# Patient Record
Sex: Male | Born: 1970
Health system: Southern US, Community
[De-identification: ages and names within clinical notes are randomized; demographics above are authoritative.]

## PROBLEM LIST (undated history)

## (undated) DIAGNOSIS — E785 Hyperlipidemia, unspecified: Secondary | ICD-10-CM

## (undated) DIAGNOSIS — I4891 Unspecified atrial fibrillation: Secondary | ICD-10-CM

## (undated) DIAGNOSIS — E119 Type 2 diabetes mellitus without complications: Secondary | ICD-10-CM

## (undated) DIAGNOSIS — B20 Human immunodeficiency virus [HIV] disease: Secondary | ICD-10-CM

## (undated) DIAGNOSIS — F419 Anxiety disorder, unspecified: Secondary | ICD-10-CM

## (undated) DIAGNOSIS — I639 Cerebral infarction, unspecified: Secondary | ICD-10-CM

## (undated) DIAGNOSIS — Z21 Asymptomatic human immunodeficiency virus [HIV] infection status: Secondary | ICD-10-CM

## (undated) DIAGNOSIS — T7840XA Allergy, unspecified, initial encounter: Secondary | ICD-10-CM

## (undated) DIAGNOSIS — I10 Essential (primary) hypertension: Secondary | ICD-10-CM

## (undated) DIAGNOSIS — F329 Major depressive disorder, single episode, unspecified: Secondary | ICD-10-CM

## (undated) HISTORY — DX: Major depressive disorder, single episode, unspecified: F32.9

## (undated) HISTORY — DX: Anxiety disorder, unspecified: F41.9

## (undated) HISTORY — PX: OTHER SURGICAL HISTORY: SHX169

## (undated) HISTORY — DX: Allergy, unspecified, initial encounter: T78.40XA

---

## 2016-01-25 DIAGNOSIS — B20 Human immunodeficiency virus [HIV] disease: Secondary | ICD-10-CM

## 2016-06-06 DIAGNOSIS — B20 Human immunodeficiency virus [HIV] disease: Secondary | ICD-10-CM

## 2016-09-12 ENCOUNTER — Ambulatory Visit: Payer: Self-pay | Admitting: Endocrinology

## 2016-11-19 ENCOUNTER — Ambulatory Visit: Payer: BLUE CROSS/BLUE SHIELD | Attending: General Surgery | Admitting: Physical Therapy

## 2016-11-19 DIAGNOSIS — R2689 Other abnormalities of gait and mobility: Secondary | ICD-10-CM | POA: Insufficient documentation

## 2016-11-19 DIAGNOSIS — M6281 Muscle weakness (generalized): Secondary | ICD-10-CM | POA: Insufficient documentation

## 2016-11-20 NOTE — Therapy (Signed)
[facility] [address] [place], [place], [address] Phone: [phone]   Fax:  [phone]  Physical Therapy Evaluation  Patient Details  Name: Francisco Hutchinson MRN: [id number] Date of Birth: 31-Jan-1971 Referring Provider: Meda Coffee, MD  Encounter Date: 11/19/2016      PT End of Session - 11/20/16 2124    Visit Number 1   Authorization Type [employer] of [place]   PT Start Time 0177   PT Stop Time 1223   PT Time Calculation (min) 91 min      No past medical history on file.  No past surgical history on file.  There were no vitals filed for this visit.       Subjective Assessment - 11/20/16 2122    Subjective power wheelchair eval with Deberah Pelton, ATP from [employer]   Patient is accompained by: Family member   Pertinent History HIV   Currently in Pain? No/denies            [facility] PT Assessment - 11/20/16 0001      Assessment   Medical Diagnosis HIV; s/p watershed CVA's   Referring Provider Meda Coffee, MD   Onset Date/Surgical Date --  Sept. 2016                Mobility/Seating Evaluation    PATIENT INFORMATION: Name: Francisco Hutchinson DOB: Sep 10, 1970  Sex: M Date seen: 11-19-16 Time: 1100  Address:  [address]                 [place], [place] [address] Physician: Meda Coffee, MD This evaluation/justification form will serve as the LMN for the following suppliers: __________________________ Supplier: [employer] Contact Person: Deberah Pelton, ATP Phone:  [phone]   Seating Therapist: Guido Sander, PT Phone:   [phone]   Phone: [phone]    Spouse/Parent/Caregiver name: ?????  Phone number: ????? Insurance/Payer: BCBS of      Reason for Referral: power wheelchair evaluation  Patient/Caregiver Goals: obtain power wheelchair for independence with mobility  Patient was seen for face-to-face evaluation for new power wheelchair.  Also present was Deberah Pelton, ATP to  discuss recommendations and wheelchair options.  Further paperwork was completed and sent to vendor.  Patient appears to qualify for power mobility device at this time per objective findings.   MEDICAL HISTORY: Diagnosis: Primary Diagnosis: Cerebral infarction x 2 Onset: Sept. 2016 Diagnosis: IDDM - Type 2: HIV   '[]'$ Progressive Disease Relevant past and future surgeries: skin graft on RUE due to necrotizing fasciitis (Sept. 2016)   Height: 5'10" Weight: 195# Explain recent changes or trends in weight: ?????   History including Falls: Pt reports having had 1 fall within past 6 months:  pt using standard walker to amb. short distance in amb. with mod assistance (nonfunctional ambulator): Pt was hospitalized on 04-24-15 with necrotizing fasciitis RUE and suffered 2 watershed CVA's; pt had kidney failure and was in a coma for 3 weeks and in ICU x 5 months.  Pt was discharged on 09-25-15 to SNF; pt received home health PT in the fall 2017.  Caregiver reports pt just started ambulating in Oct. 2017 with use of standard walker.     HOME ENVIRONMENT: '[x]'$ House  '[]'$ Condo/town home  '[]'$ Apartment  '[]'$ Assisted Living    '[]'$ Lives Alone '[x]'$  Lives with Others  Hours with caregiver: 27  '[x]'$ Home is accessible to patient           Stairs      '[x]'$ Yes '[]'$  No     Ramp '[x]'$ Yes '[]'$ No Comments:  uses ramp to enter/exit home   COMMUNITY ADL: TRANSPORTATION: '[]'$ Car    '[]'$ Van    '[]'$ Public Transportation    '[]'$ Adapted w/c Lift    '[]'$ Ambulance    '[x]'$ Other:       '[]'$ Sits in wheelchair during transport  Employment/School: ????? Specific requirements pertaining to mobility ?????  Other: planning on obtaining adapted van in future:  pt currently has a Jeep (SUV) for transportation    FUNCTIONAL/SENSORY PROCESSING SKILLS:  Handedness:   '[]'$ Right     '[x]'$ Left    '[]'$ NA  Comments:  ?????  Functional Processing Skills for Wheeled Mobility '[x]'$ Processing Skills  are adequate for safe wheelchair operation  Areas of concern than may interfere with safe operation of wheelchair Description of problem   '[]'$  Attention to environment      '[]'$ Judgment      '[]'$  Hearing  '[]'$  Vision or visual processing      '[]'$ Motor Planning  '[]'$  Fluctuations in Behavior  ?????    VERBAL COMMUNICATION: '[x]'$ WFL receptive '[x]'$  WFL expressive '[]'$ Understandable  '[]'$ Difficult to understand  '[]'$ non-communicative '[]'$  Uses an augmented communication device  CURRENT SEATING / MOBILITY: Current Mobility Base:  '[x]'$ None '[]'$ Dependent '[]'$ Manual '[]'$ Scooter '[]'$ Power  Type of Control: ?????  Manufacturer:  ?????Size:  ?????Age: ?????  Current Condition of Mobility Base:  pt is using a manual wheelchair which was given to him - he is dependent for propulsion   Current Wheelchair components:  ?????  Describe posture in present seating system:  ?????      SENSATION and SKIN ISSUES: Sensation '[]'$ Intact  '[x]'$ Impaired '[]'$ Absent  Level of sensation: Pt reports decr. sensation in both feet over metatarsal head region Pressure Relief: Able to perform effective pressure relief :    '[]'$ Yes  '[x]'$  No Method: ???? If not, Why?: Pt has decreased AROM in RUE with s/p skin graft sx due to necrotizing fasciitis and due to decreased strength in bil. UE's  Skin Issues/Skin Integrity Current Skin Issues  '[x]'$ Yes '[]'$ No '[]'$ Intact '[]'$  Red area'[x]'$  Open Area  '[]'$ Scar Tissue '[x]'$ At risk from prolonged sitting Where  on RUE and bil. LE's - open areas   History of Skin Issues  '[x]'$ Yes '[]'$ No Where  over coccyx When  Sept. 2016  Hx of skin flap surgeries  '[]'$ Yes '[x]'$ No Where  ????? When  ?????  Limited sitting tolerance '[]'$ Yes '[]'$ No Hours spent sitting in wheelchair daily: 11 hours  Complaint of Pain:  Please describe: None   Swelling/Edema: occasional edema in ankles and feet - elevates legs during night and edema is resolved by am; takes Diuretic   ADL STATUS (in reference to wheelchair use):  Indep Assist Unable Indep with Equip Not  assessed Comments  Dressing ????? X ????? ????? ????? needs assistance with lower body dressing - performs from bed ; has trapeze bar on his hospital bed  Eating X ????? ????? ????? ????? ?????  Toileting ????? X ????? ????? ????? wears Depends; unable to transfer on/off toilet independently  Bathing ????? ????? X ????? ????? does sponge baths in bed during the week; uses tub transfer bench with caregiver's assistance to transfer into tub on weekends   Grooming/Hygiene ????? ????? ????? X ????? performs from wheelchair  Meal Prep ????? X ????? X ????? light meal prep; performs from wheelchair   IADLS ????? ?????  X ????? ????? dependent for manual wheelchair propulsion in community; uses motorized scooter in stores  Bowel Management: '[]'$ Continent  '[]'$ Incontinent  '[x]'$ Accidents Comments:  wears Depends  Bladder Management: '[]'$ Continent  '[]'$ Incontinent  '[x]'$ Accidents Comments:  wears Depends     WHEELCHAIR SKILLS: Manual w/c Propulsion: '[]'$ UE or LE strength and endurance sufficient to participate in ADLs using manual wheelchair Arm : '[]'$ left '[]'$ right   '[]'$ Both      Distance: ????? Foot:  '[]'$ left '[]'$ right   '[]'$ Both  Operate Scooter: '[]'$  Strength, hand grip, balance and transfer appropriate for use '[]'$ Living environment is accessible for use of scooter  Operate Power w/c:  '[x]'$  Std. Joystick   '[]'$  Alternative Controls Indep '[]'$  Assist '[]'$  Dependent/unable '[]'$  N/A '[]'$   '[]'$ Safe          '[]'$  Functional      Distance: ?????  Bed confined without wheelchair '[x]'$  Yes '[]'$  No   STRENGTH/RANGE OF MOTION:  Active/Passive  Range of Motion Strength  Shoulder Rt shoulder flexion 90 degrees:  abdct. 68 degrees Lt shoulder flexion 114 degrees: abdct. 105 degrees LUE 3-/5 RUE 3-/5 flexors:  abductors 2+/5  Elbow Decr. Rt elbow supination - approx. 50% AROM  Lt elbow  AROm WNL's 5/5 bil. UE's for flexion and extension  Wrist/Hand Rt wrist flexion 67 degrees:  ext. = 39 degrees Lt wrist AROM WFL's R wrist flexion/extension  3-/5 LUE WFL's  Hip Bil. hip PROM WFL's - decreased actively due to muscle weakness  2+ - 3-/5 bil. LE's  Knee Rt knee extension -24 degrees from neutral: Rt knee flexion WFL's: Lt knee ext. -23 degrees from neutral:  Lt knee flexion WFL's 4/5 bil. LE's  Ankle No active dorsiflexion bil. LE's; minimal active plantarflexion bil. LE's 0/5 bil. dorsiflexors:  2-/5 bil. plantarflexors     MOBILITY/BALANCE:  '[]'$  Patient is totally dependent for mobility  ?????    Balance Transfers Ambulation  Sitting Balance: Standing Balance: '[]'$  Independent '[]'$  Independent/Modified Independent  '[x]'$  WFL     '[]'$  WFL '[x]'$  Supervision '[x]'$  Supervision  '[]'$  Uses UE for balance  '[]'$  Supervision '[]'$  Min Assist '[x]'$  Ambulates with Assist  mod to min assist- pt amb. approx. 10' due to fatigue    '[]'$  Min Assist '[]'$  Min assist '[x]'$  Mod Assist '[x]'$  Ambulates with Device:      '[x]'$  RW  '[]'$  StW  '[]'$  Cane  '[]'$  ?????  '[]'$  Mod Assist '[x]'$  Mod assist '[]'$  Max assist   '[]'$  Max Assist '[]'$  Max assist '[]'$  Dependent '[]'$  Indep. Short Distance Only  '[]'$  Unable '[]'$  Unable '[]'$  Lift / Sling Required Distance (in feet)  ?????   '[]'$  Sliding board '[]'$  Unable to Ambulate (see explanation below)  Cardio Status:  '[]'$ Intact  '[x]'$  Impaired   '[]'$  NA     had a cardioversion while in hospital; caregiver states he had atrial fibrillation  Respiratory Status:  '[x]'$ Intact   '[]'$ Impaired   '[]'$ NA     ?????  Orthotics/Prosthetics: None  Comments (Address manual vs power w/c vs scooter): Mod assist for sit to stand transfers; Supervision with sliding transfers to even surfaces;  pt is unable to effectively and functionally propel a manual wheelchair due to decreased AROM in RUE due to necrotizing fasciitis with skin graft surgery in 2016:  Pt also has decreased AROM and decr. strength in LUE.  Pt has decreased endurance/activity tolerance which limits ability to propel on uneven surfaces and with prolonged distances.  Pt is unable to operate a scooter due to decr. strength  and AROM in bil.  UE's.  Pt's home environment is not large enough to accommodate the large turning radius of a scooter.         Anterior / Posterior Obliquity Rotation-Pelvis ?????  PELVIS    '[]'$  '[x]'$  '[]'$   Neutral Posterior Anterior  '[x]'$  '[]'$  '[]'$   WFL Rt elev Lt elev  '[x]'$  '[]'$  '[]'$   WFL Right Left                      Anterior    Anterior     '[]'$  Fixed '[]'$  Other '[x]'$  Partly Flexible '[]'$  Flexible   '[]'$  Fixed '[]'$  Other '[]'$  Partly Flexible  '[x]'$  Flexible  '[]'$  Fixed '[]'$  Other '[]'$  Partly Flexible  '[x]'$  Flexible   TRUNK  '[]'$  '[x]'$  '[]'$   WFL ? Thoracic ? Lumbar  Kyphosis Lordosis  '[x]'$  '[]'$  '[]'$   WFL Convex Convex  Right Left '[]'$ c-curve '[]'$ s-curve '[]'$ multiple  '[x]'$  Neutral '[]'$  Left-anterior '[]'$  Right-anterior     '[]'$  Fixed '[]'$  Flexible '[x]'$  Partly Flexible '[]'$  Other  '[]'$  Fixed '[x]'$  Flexible '[]'$  Partly Flexible '[]'$  Other  '[]'$  Fixed             '[x]'$  Flexible '[]'$  Partly Flexible '[]'$  Other    Position Windswept  ?????  HIPS          '[x]'$            '[]'$               '[]'$    Neutral       Abduct        ADduct         '[x]'$           '[]'$            '[]'$   Neutral Right           Left      '[]'$  Fixed '[]'$  Subluxed '[]'$  Partly Flexible '[]'$  Dislocated '[x]'$  Flexible  '[]'$  Fixed '[]'$  Other '[]'$  Partly Flexible  '[x]'$  Flexible                 Foot Positioning Knee Positioning  ?????    '[x]'$  WFL  '[x]'$ Lt '[x]'$ Rt '[x]'$  WFL  '[x]'$ Lt '[x]'$ Rt    KNEES ROM concerns: ROM concerns:    & Dorsi-Flexed '[]'$ Lt '[]'$ Rt ?????    FEET Plantar Flexed '[]'$ Lt '[]'$ Rt      Inversion                 '[]'$ Lt '[]'$ Rt      Eversion                 '[]'$ Lt '[]'$ Rt     HEAD '[x]'$  Functional '[x]'$  Good Head Control  ?????  & '[]'$  Flexed         '[]'$  Extended '[]'$  Adequate Head Control    NECK '[]'$  Rotated  Lt  '[]'$  Lat Flexed Lt '[]'$  Rotated  Rt '[]'$  Lat Flexed Rt '[]'$  Limited Head Control     '[]'$  Cervical Hyperextension '[]'$  Absent  Head Control     SHOULDERS ELBOWS WRIST& HAND ?????      Left     Right    Left     Right    Left     Right   U/E '[x]'$ Functional           '[x]'$ Functional ????? limited active elbow extension and supination due to  surgery for necrotizing fasciitis surgery 04/2015 '[]'$ Fisting             '[]'$   Fisting      '[]'$ elev   '[]'$ dep      '[]'$ elev   '[]'$ dep       '[]'$ pro -'[]'$ retract     '[]'$ pro  '[]'$ retract '[]'$ subluxed             '[]'$ subluxed           Goals for Wheelchair Mobility  '[x]'$  Independence with mobility in the home with motor related ADLs (MRADLs)  '[]'$  Independence with MRADLs in the community '[]'$  Provide dependent mobility  '[]'$  Provide recline     '[x]'$ Provide tilt   Goals for Seating system '[x]'$  Optimize pressure distribution '[x]'$  Provide support needed to facilitate function or safety '[x]'$  Provide corrective forces to assist with maintaining or improving posture '[]'$  Accommodate client's posture:   current seated postures and positions are not flexible or will not tolerate corrective forces '[x]'$  Client to be independent with relieving pressure in the wheelchair '[x]'$ Enhance physiological function such as breathing, swallowing, digestion  Simulation ideas/Equipment trials:????? State why other equipment was unsuccessful:?????   MOBILITY BASE RECOMMENDATIONS and JUSTIFICATION: MOBILITY COMPONENT JUSTIFICATION  Manufacturer: SunriseModel: QM 710   Size: Width 18Seat Depth 18 '[x]'$ provide transport from point A to B      '[x]'$ promote Indep mobility  '[x]'$ is not a safe, functional ambulator '[x]'$ walker or cane inadequate '[]'$ non-standard width/depth necessary to accommodate anatomical measurement '[]'$  ?????  '[]'$ Manual Mobility Base '[]'$ non-functional ambulator    '[]'$ Scooter/POV  '[]'$ can safely operate  '[]'$ can safely transfer   '[]'$ has adequate trunk stability  '[]'$ cannot functionally propel manual w/c  '[x]'$ Power Mobility Base  '[]'$ non-ambulatory  '[x]'$ cannot functionally propel manual wheelchair  '[x]'$  cannot functionally and safely operate scooter/POV '[x]'$ can safely operate and willing to  '[]'$ Stroller Base '[]'$ infant/child  '[]'$ unable to propel manual wheelchair '[]'$ allows for growth '[]'$ non-functional ambulator '[]'$ non-functional UE '[]'$ Indep mobility is not a  goal at this time  '[x]'$ Tilt  '[]'$ Forward '[x]'$ Backward '[x]'$ Powered tilt  '[]'$ Manual tilt  '[x]'$ change position against gravitational force on head and shoulders  '[x]'$ change position for pressure relief/cannot weight shift '[]'$ transfers  '[]'$ management of tone '[x]'$ rest periods '[x]'$ control edema '[]'$ facilitate postural control  '[]'$  ?????  '[]'$ Recline  '[]'$ Power recline on power base '[]'$ Manual recline on manual base  '[]'$ accommodate femur to back angle  '[]'$ bring to full recline for ADL care  '[]'$ change position for pressure relief/cannot weight shift '[]'$ rest periods '[]'$ repositioning for transfers or clothing/diaper /catheter changes '[]'$ head positioning  '[]'$ Lighter weight required '[]'$ self- propulsion  '[]'$ lifting '[]'$  ?????  '[]'$ Heavy Duty required '[]'$ user weight greater than 250# '[]'$ extreme tone/ over active movement '[]'$ broken frame on previous chair '[]'$  ?????  '[x]'$  Back  '[]'$  Angle Adjustable '[]'$  Custom molded Jay 3 '[x]'$ postural control '[]'$ control of tone/spasticity '[]'$ accommodation of range of motion '[]'$ UE functional control '[]'$ accommodation for seating system '[]'$  ????? '[]'$ provide lateral trunk support '[]'$ accommodate deformity '[x]'$ provide posterior trunk support '[x]'$ provide lumbar/sacral support '[x]'$ support trunk in midline '[x]'$ Pressure relief over spinal processes  '[x]'$  Seat Cushion Jay Fusion '[]'$ impaired sensation  '[]'$ decubitus ulcers present '[x]'$ history of pressure ulceration '[]'$ prevent pelvic extension '[x]'$ low maintenance  '[x]'$ stabilize pelvis  '[]'$ accommodate obliquity '[]'$ accommodate multiple deformity '[x]'$ neutralize lower extremity position '[x]'$ increase pressure distribution '[]'$  ?????  '[]'$  Pelvic/thigh support  '[]'$  Lateral thigh guide '[]'$  Distal medial pad  '[]'$  Distal lateral pad '[]'$  pelvis in neutral '[]'$ accommodate pelvis '[]'$  position upper legs '[]'$  alignment '[]'$  accommodate ROM '[]'$  decr adduction '[]'$ accommodate tone '[]'$ removable for transfers '[]'$ decr abduction  '[]'$  Lateral trunk Supports '[]'$  Lt     '[]'$  Rt '[]'$ decrease lateral trunk  leaning '[]'$ control tone '[]'$ contour for increased contact '[]'$ safety  '[]'$ accommodate asymmetry '[]'$  ?????  [  x] Mounting hardware  '[]'$ lateral trunk supports  '[]'$ back   '[]'$ seat '[x]'$ headrest      '[]'$  thigh support '[]'$ fixed   '[]'$ swing away '[]'$ attach seat platform/cushion to w/c frame '[]'$ attach back cushion to w/c frame '[]'$ mount postural supports '[x]'$ mount headrest  '[]'$ swing medial thigh support away '[]'$ swing lateral supports away for transfers  '[]'$  ?????    Armrests  '[]'$ fixed '[x]'$ adjustable height '[]'$ removable   '[]'$ swing away  '[x]'$ flip back   '[]'$ reclining '[]'$ full length pads '[]'$ desk    '[]'$ pads tubular  '[x]'$ provide support with elbow at 90   '[]'$ provide support for w/c tray '[x]'$ change of height/angles for variable activities '[x]'$ remove for transfers '[x]'$ allow to come closer to table top '[]'$ remove for access to tables '[x]'$  Dual post  Hangers/ Leg rests  '[]'$ 60 '[]'$ 70 '[]'$ 90 '[]'$ elevating '[]'$ heavy duty  '[]'$ articulating '[]'$ fixed '[]'$ lift off '[]'$ swing away     '[]'$ power '[]'$ provide LE support  '[]'$ accommodate to hamstring tightness '[]'$ elevate legs during recline   '[]'$ provide change in position for Legs '[]'$ Maintain placement of feet on footplate '[]'$ durability '[]'$ enable transfers '[]'$ decrease edema '[]'$ Accommodate lower leg length '[]'$  ?????  Foot support Footplate    '[]'$ Lt  '[]'$  Rt  '[x]'$  Center mount '[]'$ flip up     '[x]'$ depth/angle adjustable '[]'$ Amputee adapter    '[]'$  Lt     '[]'$  Rt '[x]'$ provide foot support '[x]'$ accommodate to ankle ROM '[x]'$ transfers '[]'$ Provide support for residual extremity '[]'$  allow foot to go under wheelchair base '[]'$  decrease tone  '[]'$  ?????  '[]'$  Ankle strap/heel loops '[]'$ support foot on foot support '[]'$ decrease extraneous movement '[]'$ provide input to heel  '[]'$ protect foot  Tires: '[]'$ pneumatic  '[x]'$ flat free inserts  '[]'$ solid  '[x]'$ decrease maintenance  '[x]'$ prevent frequent flats '[]'$ increase shock absorbency '[]'$ decrease pain from road shock '[]'$ decrease spasms from road shock '[]'$  ?????  '[]'$  Headrest  '[]'$ provide posterior head support '[]'$ provide posterior  neck support '[]'$ provide lateral head support '[]'$ provide anterior head support '[]'$ support during tilt and recline '[]'$ improve feeding   '[]'$ improve respiration '[]'$ placement of switches '[]'$ safety  '[]'$ accommodate ROM  '[]'$ accommodate tone '[]'$ improve visual orientation  '[]'$  Anterior chest strap '[]'$  Vest '[]'$  Shoulder retractors  '[]'$ decrease forward movement of shoulder '[]'$ accommodation of TLSO '[]'$ decrease forward movement of trunk '[]'$ decrease shoulder elevation '[]'$ added abdominal support '[]'$ alignment '[]'$ assistance with shoulder control  '[]'$  ?????  Pelvic Positioner '[x]'$ Belt '[]'$ SubASIS bar '[]'$ Dual Pull '[]'$ stabilize tone '[x]'$ decrease falling out of chair/ **will not Decr potential for sliding due to pelvic tilting '[]'$ prevent excessive rotation '[]'$ pad for protection over boney prominence '[]'$ prominence comfort '[]'$ special pull angle to control rotation '[]'$  ?????  Upper Extremity Support '[]'$ L   '[]'$  R '[]'$ Arm trough    '[]'$ hand support '[]'$  tray       '[]'$ full tray '[]'$ swivel mount '[]'$ decrease edema      '[]'$ decrease subluxation   '[]'$ control tone   '[]'$ placement for AAC/Computer/EADL '[]'$ decrease gravitational pull on shoulders '[]'$ provide midline positioning '[]'$ provide support to increase UE function '[]'$ provide hand support in natural position '[]'$ provide work surface   POWER WHEELCHAIR CONTROLS  '[x]'$ Proportional  '[]'$ Non-Proportional Type Joystick '[x]'$ Left  '[]'$ Right '[x]'$ provides access for controlling wheelchair   '[]'$ lacks motor control to operate proportional drive control '[]'$ unable to understand proportional controls  Actuator Control Module  '[]'$ Single  '[x]'$ Multiple   '[x]'$ Allow the client to operate the power seat function(s) through the joystick control   '[]'$ Safety Reset Switches '[]'$ Used to change modes and stop the wheelchair when driving in latch mode    '[x]'$ Upgraded Electronics   '[]'$ programming for accurate control '[]'$ progressive Disease/changing condition '[]'$ non-proportional drive control needed '[x]'$ Needed in order to operate power seat  functions through joystick control   '[]'$   Display box '[]'$ Allows user to see in which mode and drive the wheelchair is set  '[]'$ necessary for alternate controls    '[]'$ Digital interface electronics '[]'$ Allows w/c to operate when using alternative drive controls  '[]'$ ASL Head Array '[]'$ Allows client to operate wheelchair  through switches placed in tri-panel headrest  '[]'$ Sip and puff with tubing kit '[]'$ needed to operate sip and puff drive controls  '[]'$ Upgraded tracking electronics '[]'$ increase safety when driving '[]'$ correct tracking when on uneven surfaces  '[x]'$ Mount for switches or joystick '[]'$ Attaches switches to w/c  '[x]'$ Swing away for access or transfers '[]'$ midline for optimal placement '[]'$ provides for consistent access  '[]'$ Attendant controlled joystick plus mount '[]'$ safety '[]'$ long distance driving '[]'$ operation of seat functions '[]'$ compliance with transportation regulations '[]'$  ?????    Rear wheel placement/Axle adjustability '[]'$ None '[]'$ semi adjustable '[]'$ fully adjustable  '[]'$ improved UE access to wheels '[]'$ improved stability '[]'$ changing angle in space for improvement of postural stability '[]'$ 1-arm drive access '[]'$ amputee pad placement '[]'$  ?????  Wheel rims/ hand rims  '[]'$ metal  '[]'$ plastic coated '[]'$ oblique projections '[]'$ vertical projections '[]'$ Provide ability to propel manual wheelchair  '[]'$  Increase self-propulsion with hand weakness/decreased grasp  Push handles '[]'$ extended  '[]'$ angle adjustable  '[]'$ standard '[]'$ caregiver access '[]'$ caregiver assist '[]'$ allows "hooking" to enable increased ability to perform ADLs or maintain balance  One armed device  '[]'$ Lt   '[]'$ Rt '[]'$ enable propulsion of manual wheelchair with one arm   '[]'$  ?????   Brake/wheel lock extension '[]'$  Lt   '[]'$  Rt '[]'$ increase indep in applying wheel locks   '[]'$ Side guards '[]'$ prevent clothing getting caught in wheel or becoming soiled '[]'$  prevent skin tears/abrasions  Battery: pair of 24 NF's '[x]'$ to power wheelchair ?????  Other: Seat Elevator To increase access, independence and  safety with reaching for objects in cabinets, refridgerator, etc. ?????  The above equipment has a life- long use expectancy. Growth and changes in medical and/or functional conditions would be the exceptions. This is to certify that the therapist has no financial relationship with durable medical provider or manufacturer. The therapist will not receive remuneration of any kind for the equipment recommended in this evaluation.   Patient has mobility limitation that significantly impairs safe, timely participation in one or more mobility related ADL's.  (bathing, toileting, feeding, dressing, grooming, moving from room to room)                                                             '[x]'$  Yes '[]'$  No Will mobility device sufficiently improve ability to participate and/or be aided in participation of MRADL's?         '[x]'$  Yes '[]'$  No Can limitation be compensated for with use of a cane or walker?                                                                                '[]'$  Yes '[x]'$  No Does patient or caregiver demonstrate ability/potential ability & willingness to safely use the mobility device?   '[x]'$  Yes '[]'$  No Does patient's home environment support use of recommended  mobility device?                                                    '[x]'$  Yes '[]'$  No Does patient have sufficient upper extremity function necessary to functionally propel a manual wheelchair?    '[]'$  Yes '[x]'$  No Does patient have sufficient strength and trunk stability to safely operate a POV (scooter)?                                  '[]'$  Yes '[x]'$  No Does patient need additional features/benefits provided by a power wheelchair for MRADL's in the home?       '[x]'$  Yes '[]'$  No Does the patient demonstrate the ability to safely use a power wheelchair?                                                              '[x]'$  Yes '[]'$  No  Therapist Name Printed: Guido Sander, PT Date: 11-19-16  Therapist's Signature:   Date:   Supplier's Name Printed: Mamm[employer] Lorenzo Date: 11-19-16  Supplier's Signature:   Date:  Patient/Caregiver Signature:   Date:     This is to certify that I have read this evaluation and do agree with the content within:      Physician's Name Printed: Dr. Meda Coffee  Physician's Signature:  Date:     This is to certify that I, the above signed therapist have the following affiliations: '[]'$  This DME provider '[]'$  Manufacturer of recommended equipment '[]'$  Patient's long term care facility '[x]'$  None of the above                             Plan - 11/20/16 2127    Clinical Impression Statement pt seen for power wheelchair with Deberah Pelton, [employer] from [employer] - power tilt recommended   PT Frequency One time visit   PT Treatment/Interventions Other (comment)  w/chair management   Consulted and Agree with Plan of Care Patient;Family member/caregiver      Patient will benefit from skilled therapeutic intervention in order to improve the following deficits and impairments:  Decreased strength, Decreased mobility, Abnormal gait, Decreased endurance  Visit Diagnosis: Muscle weakness (generalized) - Plan: PT plan of care cert/re-cert  Other abnormalities of gait and mobility - Plan: PT plan of care cert/re-cert     Problem List There are no active problems to display for this patient.   Alda Lea, PT 11/20/2016, 9:33 PM  [facility] [address] [place], [place], [address] Phone: [phone]   Fax:  [phone]  Name: Rodd Heft MRN: [id number] Date of Birth: 27-Feb-1971

## 2016-12-08 DIAGNOSIS — B20 Human immunodeficiency virus [HIV] disease: Secondary | ICD-10-CM | POA: Diagnosis not present

## 2017-02-09 ENCOUNTER — Encounter: Payer: Self-pay | Admitting: Family Medicine

## 2017-02-09 LAB — HM DIABETES EYE EXAM

## 2017-02-17 ENCOUNTER — Encounter: Payer: Self-pay | Admitting: Family Medicine

## 2017-03-23 ENCOUNTER — Inpatient Hospital Stay (HOSPITAL_COMMUNITY): Payer: BLUE CROSS/BLUE SHIELD | Admitting: Certified Registered Nurse Anesthetist

## 2017-03-23 ENCOUNTER — Encounter (HOSPITAL_COMMUNITY): Admission: EM | Disposition: A | Discharge status: Skilled Nursing Facility | Payer: Self-pay | Source: Home / Self Care

## 2017-03-23 ENCOUNTER — Encounter (HOSPITAL_COMMUNITY): Payer: Self-pay | Admitting: Emergency Medicine

## 2017-03-23 ENCOUNTER — Inpatient Hospital Stay (HOSPITAL_COMMUNITY)
Admission: EM | Admit: 2017-03-23 | Discharge: 2017-04-01 | DRG: 570 | Disposition: A | Discharge status: Skilled Nursing Facility | Payer: BLUE CROSS/BLUE SHIELD | Attending: Surgery | Admitting: Surgery

## 2017-03-23 DIAGNOSIS — Z833 Family history of diabetes mellitus: Secondary | ICD-10-CM

## 2017-03-23 DIAGNOSIS — Z7982 Long term (current) use of aspirin: Secondary | ICD-10-CM | POA: Diagnosis not present

## 2017-03-23 DIAGNOSIS — N183 Chronic kidney disease, stage 3 unspecified: Secondary | ICD-10-CM

## 2017-03-23 DIAGNOSIS — K611 Rectal abscess: Secondary | ICD-10-CM | POA: Diagnosis present

## 2017-03-23 DIAGNOSIS — Z8673 Personal history of transient ischemic attack (TIA), and cerebral infarction without residual deficits: Secondary | ICD-10-CM

## 2017-03-23 DIAGNOSIS — I639 Cerebral infarction, unspecified: Secondary | ICD-10-CM | POA: Diagnosis present

## 2017-03-23 DIAGNOSIS — N493 Fournier gangrene: Secondary | ICD-10-CM | POA: Diagnosis present

## 2017-03-23 DIAGNOSIS — N289 Disorder of kidney and ureter, unspecified: Secondary | ICD-10-CM

## 2017-03-23 DIAGNOSIS — Z8572 Personal history of non-Hodgkin lymphomas: Secondary | ICD-10-CM

## 2017-03-23 DIAGNOSIS — Z79899 Other long term (current) drug therapy: Secondary | ICD-10-CM

## 2017-03-23 DIAGNOSIS — M726 Necrotizing fasciitis: Secondary | ICD-10-CM | POA: Diagnosis present

## 2017-03-23 DIAGNOSIS — Z9889 Other specified postprocedural states: Secondary | ICD-10-CM

## 2017-03-23 DIAGNOSIS — I129 Hypertensive chronic kidney disease with stage 1 through stage 4 chronic kidney disease, or unspecified chronic kidney disease: Secondary | ICD-10-CM | POA: Diagnosis present

## 2017-03-23 DIAGNOSIS — D72829 Elevated white blood cell count, unspecified: Secondary | ICD-10-CM | POA: Diagnosis present

## 2017-03-23 DIAGNOSIS — B9562 Methicillin resistant Staphylococcus aureus infection as the cause of diseases classified elsewhere: Secondary | ICD-10-CM | POA: Diagnosis present

## 2017-03-23 DIAGNOSIS — B964 Proteus (mirabilis) (morganii) as the cause of diseases classified elsewhere: Secondary | ICD-10-CM | POA: Diagnosis present

## 2017-03-23 DIAGNOSIS — E118 Type 2 diabetes mellitus with unspecified complications: Secondary | ICD-10-CM | POA: Diagnosis not present

## 2017-03-23 DIAGNOSIS — E1322 Other specified diabetes mellitus with diabetic chronic kidney disease: Secondary | ICD-10-CM | POA: Diagnosis not present

## 2017-03-23 DIAGNOSIS — L8990 Pressure ulcer of unspecified site, unspecified stage: Secondary | ICD-10-CM | POA: Insufficient documentation

## 2017-03-23 DIAGNOSIS — L0231 Cutaneous abscess of buttock: Secondary | ICD-10-CM | POA: Diagnosis present

## 2017-03-23 DIAGNOSIS — B20 Human immunodeficiency virus [HIV] disease: Secondary | ICD-10-CM | POA: Diagnosis present

## 2017-03-23 DIAGNOSIS — E1122 Type 2 diabetes mellitus with diabetic chronic kidney disease: Secondary | ICD-10-CM

## 2017-03-23 DIAGNOSIS — Z794 Long term (current) use of insulin: Secondary | ICD-10-CM

## 2017-03-23 HISTORY — DX: Essential (primary) hypertension: I10

## 2017-03-23 HISTORY — DX: Human immunodeficiency virus (HIV) disease: B20

## 2017-03-23 HISTORY — DX: Asymptomatic human immunodeficiency virus (hiv) infection status: Z21

## 2017-03-23 HISTORY — PX: IRRIGATION AND DEBRIDEMENT BUTTOCKS: SHX6601

## 2017-03-23 HISTORY — DX: Type 2 diabetes mellitus without complications: E11.9

## 2017-03-23 HISTORY — DX: Cerebral infarction, unspecified: I63.9

## 2017-03-23 LAB — GLUCOSE, CAPILLARY
Glucose-Capillary: 103 mg/dL — ABNORMAL HIGH (ref 65–99)
Glucose-Capillary: 109 mg/dL — ABNORMAL HIGH (ref 65–99)
Glucose-Capillary: 204 mg/dL — ABNORMAL HIGH (ref 65–99)

## 2017-03-23 LAB — COMPREHENSIVE METABOLIC PANEL
ALT: 20 U/L (ref 17–63)
ANION GAP: 8 (ref 5–15)
AST: 22 U/L (ref 15–41)
Albumin: 2.1 g/dL — ABNORMAL LOW (ref 3.5–5.0)
Alkaline Phosphatase: 99 U/L (ref 38–126)
BILIRUBIN TOTAL: 0.8 mg/dL (ref 0.3–1.2)
BUN: 28 mg/dL — ABNORMAL HIGH (ref 6–20)
CALCIUM: 8 mg/dL — AB (ref 8.9–10.3)
CO2: 28 mmol/L (ref 22–32)
Chloride: 97 mmol/L — ABNORMAL LOW (ref 101–111)
Creatinine, Ser: 1.45 mg/dL — ABNORMAL HIGH (ref 0.61–1.24)
GFR, EST NON AFRICAN AMERICAN: 57 mL/min — AB (ref >60)
Glucose, Bld: 121 mg/dL — ABNORMAL HIGH (ref 65–99)
POTASSIUM: 3.8 mmol/L (ref 3.5–5.1)
Sodium: 133 mmol/L — ABNORMAL LOW (ref 135–145)
TOTAL PROTEIN: 7.4 g/dL (ref 6.5–8.1)

## 2017-03-23 LAB — CBC WITH DIFFERENTIAL/PLATELET
BASOS ABS: 0 10*3/uL (ref 0.0–0.1)
Basophils Relative: 0 %
EOS ABS: 0 10*3/uL (ref 0.0–0.7)
Eosinophils Relative: 0 %
HEMATOCRIT: 36.1 % — AB (ref 39.0–52.0)
Hemoglobin: 12.4 g/dL — ABNORMAL LOW (ref 13.0–17.0)
LYMPHS ABS: 1.2 10*3/uL (ref 0.7–4.0)
Lymphocytes Relative: 4 %
MCH: 27.3 pg (ref 26.0–34.0)
MCHC: 34.3 g/dL (ref 30.0–36.0)
MCV: 79.3 fL (ref 78.0–100.0)
MONO ABS: 1.5 10*3/uL — AB (ref 0.1–1.0)
MONOS PCT: 5 %
NEUTROS ABS: 26.4 10*3/uL — AB (ref 1.7–7.7)
Neutrophils Relative %: 91 %
PLATELETS: 436 10*3/uL — AB (ref 150–400)
RBC: 4.55 MIL/uL (ref 4.22–5.81)
RDW: 18.4 % — AB (ref 11.5–15.5)
WBC: 29.1 10*3/uL — ABNORMAL HIGH (ref 4.0–10.5)

## 2017-03-23 LAB — MRSA PCR SCREENING: MRSA BY PCR: POSITIVE — AB

## 2017-03-23 LAB — I-STAT CG4 LACTIC ACID, ED: Lactic Acid, Venous: 1.38 mmol/L (ref 0.5–1.9)

## 2017-03-23 SURGERY — IRRIGATION AND DEBRIDEMENT BUTTOCKS
Anesthesia: General | Site: Buttocks | Laterality: Left

## 2017-03-23 MED ORDER — HEPARIN SODIUM (PORCINE) 5000 UNIT/ML IJ SOLN
5000.0000 [IU] | Freq: Three times a day (TID) | INTRAMUSCULAR | Status: DC
  Administered 2017-03-23 – 2017-04-01 (×24): 5000 [IU] via SUBCUTANEOUS
  Filled 2017-03-23 (×25): qty 1

## 2017-03-23 MED ORDER — INSULIN GLARGINE 100 UNIT/ML ~~LOC~~ SOLN
20.0000 [IU] | Freq: Every day | SUBCUTANEOUS | Status: DC
  Administered 2017-03-23 – 2017-03-25 (×3): 20 [IU] via SUBCUTANEOUS
  Filled 2017-03-23 (×4): qty 0.2

## 2017-03-23 MED ORDER — ONDANSETRON HCL 4 MG/2ML IJ SOLN
INTRAMUSCULAR | Status: AC
  Filled 2017-03-23: qty 2

## 2017-03-23 MED ORDER — ZOLPIDEM TARTRATE 5 MG PO TABS
5.0000 mg | ORAL_TABLET | Freq: Every evening | ORAL | Status: DC | PRN
  Administered 2017-03-30: 5 mg via ORAL
  Filled 2017-03-23: qty 1

## 2017-03-23 MED ORDER — ONDANSETRON HCL 4 MG/2ML IJ SOLN
INTRAMUSCULAR | Status: AC
Start: 2017-03-23 — End: 2017-03-23
  Filled 2017-03-23: qty 2

## 2017-03-23 MED ORDER — INSULIN ASPART 100 UNIT/ML ~~LOC~~ SOLN
0.0000 [IU] | Freq: Three times a day (TID) | SUBCUTANEOUS | Status: DC
  Administered 2017-03-24: 8 [IU] via SUBCUTANEOUS
  Administered 2017-03-24: 3 [IU] via SUBCUTANEOUS
  Administered 2017-03-24: 11 [IU] via SUBCUTANEOUS
  Administered 2017-03-25: 2 [IU] via SUBCUTANEOUS
  Administered 2017-03-26: 3 [IU] via SUBCUTANEOUS
  Administered 2017-03-26 (×2): 2 [IU] via SUBCUTANEOUS
  Administered 2017-03-27 – 2017-03-29 (×3): 3 [IU] via SUBCUTANEOUS
  Administered 2017-03-30 – 2017-03-31 (×2): 2 [IU] via SUBCUTANEOUS
  Administered 2017-03-31: 3 [IU] via SUBCUTANEOUS

## 2017-03-23 MED ORDER — ONDANSETRON HCL 4 MG PO TABS: 4.0000 mg | ORAL_TABLET | Freq: Four times a day (QID) | ORAL | Status: DC | PRN

## 2017-03-23 MED ORDER — DEXAMETHASONE SODIUM PHOSPHATE 10 MG/ML IJ SOLN
INTRAMUSCULAR | Status: AC
  Filled 2017-03-23: qty 1

## 2017-03-23 MED ORDER — SODIUM CHLORIDE 0.9 % IV BOLUS (SEPSIS)
500.0000 mL | Freq: Once | INTRAVENOUS | Status: AC
  Administered 2017-03-23: 500 mL via INTRAVENOUS

## 2017-03-23 MED ORDER — ONDANSETRON HCL 4 MG/2ML IJ SOLN
4.0000 mg | Freq: Four times a day (QID) | INTRAMUSCULAR | Status: DC | PRN
  Administered 2017-03-25: 4 mg via INTRAVENOUS

## 2017-03-23 MED ORDER — ACETAMINOPHEN 650 MG RE SUPP: 650.0000 mg | Freq: Four times a day (QID) | RECTAL | Status: DC | PRN

## 2017-03-23 MED ORDER — FENTANYL CITRATE (PF) 100 MCG/2ML IJ SOLN
INTRAMUSCULAR | Status: DC | PRN
  Administered 2017-03-23 (×4): 50 ug via INTRAVENOUS

## 2017-03-23 MED ORDER — MIDAZOLAM HCL 2 MG/2ML IJ SOLN
INTRAMUSCULAR | Status: AC
  Filled 2017-03-23: qty 2

## 2017-03-23 MED ORDER — PHENYLEPHRINE 40 MCG/ML (10ML) SYRINGE FOR IV PUSH (FOR BLOOD PRESSURE SUPPORT)
PREFILLED_SYRINGE | INTRAVENOUS | Status: AC
  Filled 2017-03-23: qty 20

## 2017-03-23 MED ORDER — VANCOMYCIN HCL IN DEXTROSE 1-5 GM/200ML-% IV SOLN
INTRAVENOUS | Status: AC
  Filled 2017-03-23: qty 200

## 2017-03-23 MED ORDER — HYDROCODONE-ACETAMINOPHEN 5-325 MG PO TABS
1.0000 | ORAL_TABLET | ORAL | Status: DC | PRN
  Filled 2017-03-23 (×2): qty 2

## 2017-03-23 MED ORDER — FENOFIBRATE 160 MG PO TABS
160.0000 mg | ORAL_TABLET | Freq: Every day | ORAL | Status: DC
  Administered 2017-03-23 – 2017-04-01 (×10): 160 mg via ORAL
  Filled 2017-03-23 (×10): qty 1

## 2017-03-23 MED ORDER — ONDANSETRON HCL 4 MG/2ML IJ SOLN: 4.0000 mg | Freq: Once | INTRAMUSCULAR | Status: DC | PRN

## 2017-03-23 MED ORDER — ACETAMINOPHEN 325 MG PO TABS: 650.0000 mg | ORAL_TABLET | Freq: Four times a day (QID) | ORAL | Status: DC | PRN

## 2017-03-23 MED ORDER — HYDROMORPHONE HCL-NACL 0.5-0.9 MG/ML-% IV SOSY: 0.2500 mg | PREFILLED_SYRINGE | INTRAVENOUS | Status: DC | PRN

## 2017-03-23 MED ORDER — LIDOCAINE 2% (20 MG/ML) 5 ML SYRINGE
INTRAMUSCULAR | Status: AC
  Filled 2017-03-23: qty 5

## 2017-03-23 MED ORDER — INSULIN ASPART 100 UNIT/ML ~~LOC~~ SOLN
0.0000 [IU] | Freq: Every day | SUBCUTANEOUS | Status: DC
  Administered 2017-03-23: 2 [IU] via SUBCUTANEOUS
  Administered 2017-03-24: 3 [IU] via SUBCUTANEOUS
  Administered 2017-03-26: 2 [IU] via SUBCUTANEOUS
  Administered 2017-03-27 – 2017-03-31 (×2): 3 [IU] via SUBCUTANEOUS

## 2017-03-23 MED ORDER — INSULIN GLARGINE 100 UNIT/ML SOLOSTAR PEN: 20.0000 [IU] | PEN_INJECTOR | Freq: Every day | SUBCUTANEOUS | Status: DC

## 2017-03-23 MED ORDER — SUCCINYLCHOLINE CHLORIDE 200 MG/10ML IV SOSY
PREFILLED_SYRINGE | INTRAVENOUS | Status: AC
  Filled 2017-03-23: qty 10

## 2017-03-23 MED ORDER — MEPERIDINE HCL 50 MG/ML IJ SOLN: 6.2500 mg | INTRAMUSCULAR | Status: DC | PRN

## 2017-03-23 MED ORDER — FENTANYL CITRATE (PF) 100 MCG/2ML IJ SOLN
INTRAMUSCULAR | Status: AC
  Filled 2017-03-23: qty 2

## 2017-03-23 MED ORDER — VANCOMYCIN HCL IN DEXTROSE 1-5 GM/200ML-% IV SOLN
1000.0000 mg | Freq: Two times a day (BID) | INTRAVENOUS | Status: DC
  Administered 2017-03-24 – 2017-03-26 (×5): 1000 mg via INTRAVENOUS
  Filled 2017-03-23 (×5): qty 200

## 2017-03-23 MED ORDER — PHENYLEPHRINE 40 MCG/ML (10ML) SYRINGE FOR IV PUSH (FOR BLOOD PRESSURE SUPPORT)
PREFILLED_SYRINGE | INTRAVENOUS | Status: DC | PRN
  Administered 2017-03-23 (×10): 80 ug via INTRAVENOUS

## 2017-03-23 MED ORDER — SODIUM CHLORIDE 0.9 % IV SOLN
INTRAVENOUS | Status: DC
  Administered 2017-03-23 – 2017-03-29 (×7): via INTRAVENOUS

## 2017-03-23 MED ORDER — 0.9 % SODIUM CHLORIDE (POUR BTL) OPTIME
TOPICAL | Status: DC | PRN
  Administered 2017-03-23: 1000 mL

## 2017-03-23 MED ORDER — AMLODIPINE BESYLATE 5 MG PO TABS
5.0000 mg | ORAL_TABLET | Freq: Every day | ORAL | Status: DC
  Administered 2017-03-24 – 2017-04-01 (×9): 5 mg via ORAL
  Filled 2017-03-23 (×9): qty 1

## 2017-03-23 MED ORDER — ONDANSETRON HCL 4 MG/2ML IJ SOLN
INTRAMUSCULAR | Status: DC | PRN
  Administered 2017-03-23: 4 mg via INTRAVENOUS

## 2017-03-23 MED ORDER — DOLUTEGRAVIR SODIUM 50 MG PO TABS
50.0000 mg | ORAL_TABLET | Freq: Every day | ORAL | Status: DC
  Administered 2017-03-23 – 2017-04-01 (×10): 50 mg via ORAL
  Filled 2017-03-23 (×10): qty 1

## 2017-03-23 MED ORDER — PROPOFOL 10 MG/ML IV BOLUS
INTRAVENOUS | Status: DC | PRN
  Administered 2017-03-23: 120 mg via INTRAVENOUS

## 2017-03-23 MED ORDER — MIDAZOLAM HCL 5 MG/5ML IJ SOLN
INTRAMUSCULAR | Status: DC | PRN
  Administered 2017-03-23 (×2): 1 mg via INTRAVENOUS

## 2017-03-23 MED ORDER — VANCOMYCIN HCL IN DEXTROSE 1-5 GM/200ML-% IV SOLN
1000.0000 mg | Freq: Once | INTRAVENOUS | Status: AC
  Administered 2017-03-23: 1000 mg via INTRAVENOUS

## 2017-03-23 MED ORDER — HYDROCHLOROTHIAZIDE 25 MG PO TABS
25.0000 mg | ORAL_TABLET | Freq: Every day | ORAL | Status: DC
  Administered 2017-03-24 – 2017-04-01 (×9): 25 mg via ORAL
  Filled 2017-03-23 (×9): qty 1

## 2017-03-23 MED ORDER — EMTRICITABINE-TENOFOVIR AF 200-25 MG PO TABS
1.0000 | ORAL_TABLET | Freq: Every day | ORAL | Status: DC
  Administered 2017-03-24 – 2017-04-01 (×9): 1 via ORAL
  Filled 2017-03-23 (×9): qty 1

## 2017-03-23 MED ORDER — PIPERACILLIN-TAZOBACTAM 3.375 G IVPB 30 MIN
3.3750 g | Freq: Once | INTRAVENOUS | Status: AC
  Administered 2017-03-23: 3.375 g via INTRAVENOUS
  Filled 2017-03-23: qty 50

## 2017-03-23 MED ORDER — DEXAMETHASONE SODIUM PHOSPHATE 10 MG/ML IJ SOLN
INTRAMUSCULAR | Status: DC | PRN
  Administered 2017-03-23: 5 mg via INTRAVENOUS

## 2017-03-23 MED ORDER — METOPROLOL TARTRATE 50 MG PO TABS
100.0000 mg | ORAL_TABLET | Freq: Every day | ORAL | Status: DC
  Administered 2017-03-24 – 2017-04-01 (×9): 100 mg via ORAL
  Filled 2017-03-23: qty 2
  Filled 2017-03-23: qty 4
  Filled 2017-03-23 (×4): qty 2
  Filled 2017-03-23 (×2): qty 4
  Filled 2017-03-23: qty 2

## 2017-03-23 MED ORDER — LACTATED RINGERS IV SOLN
INTRAVENOUS | Status: DC | PRN
  Administered 2017-03-23 (×2): via INTRAVENOUS

## 2017-03-23 MED ORDER — SUCCINYLCHOLINE CHLORIDE 20 MG/ML IJ SOLN
INTRAMUSCULAR | Status: DC | PRN
  Administered 2017-03-23: 120 mg via INTRAVENOUS

## 2017-03-23 MED ORDER — SERTRALINE HCL 50 MG PO TABS
50.0000 mg | ORAL_TABLET | Freq: Every day | ORAL | Status: DC
  Administered 2017-03-23 – 2017-04-01 (×10): 50 mg via ORAL
  Filled 2017-03-23 (×10): qty 1

## 2017-03-23 MED ORDER — LIDOCAINE 2% (20 MG/ML) 5 ML SYRINGE
INTRAMUSCULAR | Status: DC | PRN
  Administered 2017-03-23: 100 mg via INTRAVENOUS

## 2017-03-23 MED ORDER — PIPERACILLIN-TAZOBACTAM 3.375 G IVPB
3.3750 g | Freq: Three times a day (TID) | INTRAVENOUS | Status: DC
  Administered 2017-03-23 – 2017-03-26 (×9): 3.375 g via INTRAVENOUS
  Filled 2017-03-23 (×9): qty 50

## 2017-03-23 MED ORDER — PROPOFOL 10 MG/ML IV BOLUS
INTRAVENOUS | Status: AC
  Filled 2017-03-23: qty 20

## 2017-03-23 SURGICAL SUPPLY — 24 items
BLADE HEX COATED 2.75 (ELECTRODE) ×3 IMPLANT
BLADE SURG 15 STRL LF DISP TIS (BLADE) ×1 IMPLANT
BLADE SURG 15 STRL SS (BLADE) ×2
BNDG GAUZE ELAST 4 BULKY (GAUZE/BANDAGES/DRESSINGS) ×3 IMPLANT
COVER SURGICAL LIGHT HANDLE (MISCELLANEOUS) ×3 IMPLANT
ELECT PENCIL ROCKER SW 15FT (MISCELLANEOUS) ×3 IMPLANT
ELECT REM PT RETURN 15FT ADLT (MISCELLANEOUS) ×3 IMPLANT
GAUZE SPONGE 4X4 12PLY STRL (GAUZE/BANDAGES/DRESSINGS) ×3 IMPLANT
GAUZE SPONGE 4X4 16PLY XRAY LF (GAUZE/BANDAGES/DRESSINGS) ×3 IMPLANT
GLOVE BIOGEL PI IND STRL 7.0 (GLOVE) ×1 IMPLANT
GLOVE BIOGEL PI INDICATOR 7.0 (GLOVE) ×2
GOWN STRL REUS W/TWL LRG LVL3 (GOWN DISPOSABLE) ×3 IMPLANT
GOWN STRL REUS W/TWL XL LVL3 (GOWN DISPOSABLE) ×6 IMPLANT
KIT BASIN OR (CUSTOM PROCEDURE TRAY) ×3 IMPLANT
LUBRICANT JELLY K Y 4OZ (MISCELLANEOUS) IMPLANT
NEEDLE HYPO 25X1 1.5 SAFETY (NEEDLE) IMPLANT
PACK LITHOTOMY IV (CUSTOM PROCEDURE TRAY) ×3 IMPLANT
PAD ABD 8X10 STRL (GAUZE/BANDAGES/DRESSINGS) ×3 IMPLANT
SOL PREP PROV IODINE SCRUB 4OZ (MISCELLANEOUS) ×3 IMPLANT
SWAB COLLECTION DEVICE MRSA (MISCELLANEOUS) ×3 IMPLANT
SYR CONTROL 10ML LL (SYRINGE) IMPLANT
TOWEL OR 17X26 10 PK STRL BLUE (TOWEL DISPOSABLE) ×3 IMPLANT
UNDERPAD 30X30 (UNDERPADS AND DIAPERS) ×3 IMPLANT
YANKAUER SUCT BULB TIP 10FT TU (MISCELLANEOUS) ×3 IMPLANT

## 2017-03-23 NOTE — H&P (Addendum)
[facility]                                                                                    Patient Demographics  Francisco Hutchinson, is a 46 y.o. male  CSN: [id number]  MRN: [id number]  DOB - Aug 29, 1970  Admit Date - 03/23/2017  Outpatient Primary MD for the patient is Marco Collie, MD   With History of -  Past Medical History:  Diagnosis Date  . Diabetes mellitus without complication ([place])   . HIV (human immunodeficiency virus infection) ([place])   . Hypertension   . Stroke [facility])       Past Surgical History:  Procedure Laterality Date  . abscess removed from back and left armpit    . Surgery Right Arm      in for   Chief Complaint  Patient presents with  . Abscess     HPI  Francisco Hutchinson  is a 46 y.o. male, With past medical history significant for HIV disease being followed at [facility] clinic , and history of diabetes mellitus on Lantus, presenting with 6 days history of left buttock pain, abscess and later on drained. The patient has history of right arm necrotic fasciitis status post treatment in the past his HIV PCR is unknown for now but he is on medications and reports to be compliant patient was seen by surgery who will perform the procedure today after all the labs were drawn.    Review of Systems    In addition to the HPI above,  No Headache, No changes with Vision or hearing, No problems swallowing food or Liquids, No Chest pain, Cough or Shortness of Breath, No Abdominal pain, No Nausea or Vommitting, Bowel movements are regular, No Blood in stool or Urine, No dysuria, No new skin rashes or bruises, No new joints pains-aches,  No new weakness, tingling, numbness in any extremity, No recent weight gain or loss, No polyuria, polydypsia or polyphagia, No significant Mental Stressors.  A full 10 point Review of Systems was done, except as stated above, all other Review of Systems were negative.   Social History Social History   Substance Use Topics  . Smoking status: Never Smoker  . Smokeless tobacco: Never Used  . Alcohol use No     Family History History reviewed. No pertinent family history.   Prior to Admission medications   Medication Sig Start Date End Date Taking? Authorizing Provider  ACETAMINOPHEN PO Take 2 tablets by mouth daily as needed (fever and mild pain).   Yes [provider]  amLODipine (NORVASC) 5 MG tablet Take 5 mg by mouth daily.  03/03/17  Yes [provider]  aspirin EC 81 MG tablet Take 81 mg by mouth at bedtime.   Yes [provider]  DESCOVY 200-25 MG tablet Take 1 tablet by mouth daily. 03/19/17  Yes [provider]  fenofibrate 160 MG tablet Take 80 mg by mouth at bedtime.  01/20/17  Yes [provider]  hydrochlorothiazide (HYDRODIURIL) 25 MG tablet Take 25 mg by mouth daily.  02/18/17  Yes [provider]  LANTUS SOLOSTAR 100 UNIT/ML Solostar Pen Inject 30 Units into the  skin at bedtime.  03/03/17  Yes [provider]  metoprolol tartrate (LOPRESSOR) 100 MG tablet Take 100 mg by mouth 2 (two) times daily.  01/26/17  Yes [provider]  sertraline (ZOLOFT) 50 MG tablet Take 50 mg by mouth at bedtime.  02/18/17  Yes [provider]  TIVICAY 50 MG tablet Take 50 mg by mouth at bedtime.  03/19/17  Yes [provider]  TRULICITY 1.5 YS/0.6TK SOPN Inject 1.5 mg into the skin once a week. On Sunday 02/27/17  Yes [provider]    No Known Allergies  Physical Exam  Vitals  Blood pressure 117/62, pulse 66, temperature 99.3 F (37.4 C), temperature source Oral, resp. rate 20, SpO2 98 %.   1. General Young male, looks tired  2. Normal affect and insight, Not Suicidal or Homicidal, Awake Alert, Oriented X 3.  3. No F.N deficits, grossly, patient moving all extremities.  4. Ears and Eyes appear Normal, Conjunctivae clear, PERRLA. Moist Oral Mucosa.  5. Supple Neck, No JVD, No cervical  lymphadenopathy appriciated, No Carotid Bruits.  6. Symmetrical Chest wall movement, Good air movement bilaterally, CTAB.  7. RRR, No Gallops, Rubs or Murmurs, No Parasternal Heave.  8. Positive Bowel Sounds, Abdomen Soft, Non tender, No organomegaly appriciated,No rebound -guarding or rigidity.  9.  No Cyanosis, Normal Skin Turgor, No Skin Rash or Bruise.  10. Good muscle tone,  left buttocks ulcer with drain noted. Old right arm wound noted with few open ulcers noted.    Data Review  CBC No results for input(s): WBC, HGB, HCT, PLT, MCV, MCH, MCHC, RDW, LYMPHSABS, MONOABS, EOSABS, BASOSABS, BANDABS in the last 168 hours.  Invalid input(s): NEUTRABS, BANDSABD ------------------------------------------------------------------------------------------------------------------  Chemistries  No results for input(s): NA, K, CL, CO2, GLUCOSE, BUN, CREATININE, CALCIUM, MG, AST, ALT, ALKPHOS, BILITOT in the last 168 hours.  Invalid input(s): GFRCGP ------------------------------------------------------------------------------------------------------------------ CrCl cannot be calculated (No order found.). ------------------------------------------------------------------------------------------------------------------ No results for input(s): TSH, T4TOTAL, T3FREE, THYROIDAB in the last 72 hours.  Invalid input(s): FREET3   Coagulation profile No results for input(s): INR, PROTIME in the last 168 hours. ------------------------------------------------------------------------------------------------------------------- No results for input(s): DDIMER in the last 72 hours. -------------------------------------------------------------------------------------------------------------------  Cardiac Enzymes No results for input(s): CKMB, TROPONINI, MYOGLOBIN in the last 168 hours.  Invalid input(s):  CK ------------------------------------------------------------------------------------------------------------------ Invalid input(s): POCBNP   ---------------------------------------------------------------------------------------------------------------  Urinalysis No results found for: COLORURINE, APPEARANCEUR, LABSPEC, [place], GLUCOSEU, HGBUR, BILIRUBINUR, KETONESUR, PROTEINUR, UROBILINOGEN, NITRITE, LEUKOCYTESUR  ----------------------------------------------------------------------------------------------------------------     Imaging results:   No results found.    Assessment & Plan  1. Left buttock abscess 2. HIV disease on medications, unknown status 3. Diabetes mellitus on Lantus 4. History of right arm cellulitis/necrotic fasciitis, treated  Plan IV vancomycin and Zosyn Continue with HIV medications For surgery today ID Consulted  DVT Prophylaxis Lovenox  AM Labs Ordered, also please review Full Orders  Family Communication: Admission, patients condition and plan of care including tests being ordered have been discussed with the patient and mother who indicate understanding and agree with the plan and Code Status.  Code Status full  Disposition Plan: Home  Time spent in minutes : 44 minutes  Condition GUARDED   @SIGNATURE @

## 2017-03-23 NOTE — ED Notes (Signed)
ED Provider at bedside. 

## 2017-03-23 NOTE — ED Notes (Signed)
Hospitalist at bedside 

## 2017-03-23 NOTE — Consult Note (Signed)
Subjective: CC: Perirectal abscess.  Hx:  I was asked to see Francisco Hutchinson in consultation by Dr. Excell Seltzer during a debridement of what turned out to be perirectal and perineal necrotizing fasciitis that involved the periurethral area around the bulb.   No obvious urethral rent was identified but the bulbospongiosus was associated with the necrosis.  I interviewed Francisco Hutchinson in PACU post op.   He had no perioperative voiding complaints other than possible malodorous urine.  He had an episode of retention a couple of years ago but no other GU history.  A foley passed without difficulty and the urine was initial clear but became quite purulent at the end of drainage.     ROS:  Review of Systems  Unable to perform ROS: Critical illness    No Known Allergies  Past Medical History:  Diagnosis Date  . Diabetes mellitus without complication ([place])   . HIV (human immunodeficiency virus infection) ([place])   . Hypertension   . Stroke [facility])     Past Surgical History:  Procedure Laterality Date  . abscess removed from back and left armpit    . Surgery Right Arm      Social History   Social History  . Marital status: Single    Spouse name: N/A  . Number of children: N/A  . Years of education: N/A   Occupational History  . Not on file.   Social History Main Topics  . Smoking status: Never Smoker  . Smokeless tobacco: Never Used  . Alcohol use No  . Drug use: No  . Sexual activity: No   Other Topics Concern  . Not on file   Social History Narrative  . No narrative on file    History reviewed. No pertinent family history.  Anti-infectives: Anti-infectives    Start     Dose/Rate Route Frequency Ordered Stop   03/23/17 1428  vancomycin (VANCOCIN) 1-5 GM/200ML-% IVPB    Comments:  [place], Lacey   : cabinet override      03/23/17 1428 03/23/17 1518   03/23/17 1215  piperacillin-tazobactam (ZOSYN) IVPB 3.375 g     3.375 g 100 mL/hr over 30 Minutes Intravenous  Once 03/23/17  1211 03/23/17 1353   03/23/17 1215  [MAR Hold]  vancomycin (VANCOCIN) IVPB 1000 mg/200 mL premix     (MAR Hold since 03/23/17 1421)   1,000 mg 200 mL/hr over 60 Minutes Intravenous  Once 03/23/17 1211 03/23/17 1518      Current Facility-Administered Medications  Medication Dose Route Frequency Provider Last Rate Last Dose  . HYDROmorphone (DILAUDID) injection 0.25-0.5 mg  0.25-0.5 mg Intravenous Q5 min PRN Lillia Abed, MD      . meperidine (DEMEROL) injection 6.25-12.5 mg  6.25-12.5 mg Intravenous Q5 min PRN Lillia Abed, MD      . ondansetron [facility]) injection 4 mg  4 mg Intravenous Once PRN Lillia Abed, MD         Objective: Vital signs in last 24 hours: Temp:  [99.3 F (37.4 C)] 99.3 F (37.4 C) (08/13 1650) Pulse Rate:  [66-102] 93 (08/13 1715) Resp:  [11-20] 11 (08/13 1715) BP: (117-141)/(62-94) 130/89 (08/13 1715) SpO2:  [98 %-100 %] 100 % (08/13 1715)  Intake/Output from previous day: No intake/output data recorded. Intake/Output this shift: Total I/O In: 1500 [I.V.:1500] Out: 450 [Urine:350; Blood:100]   Physical Exam  Genitourinary:  Genitourinary Comments: Intraoperative inspection of the perineum demonstrated extensive necrotizing fasciitis that involved the bulbospongiosus and bulbocavernosus muscle on the right.  The scrotum had minimal inflammation and there didn't appear to be involvement of the scrotal compartments.   There was a foley at the meatus and no obvious urethral fistula were noted.       Lab Results:   Recent Labs  03/23/17 1347  WBC 29.1*  HGB 12.4*  HCT 36.1*  PLT 436*   BMET  Recent Labs  03/23/17 1347  NA 133*  K 3.8  CL 97*  CO2 28  GLUCOSE 121*  BUN 28*  CREATININE 1.45*  CALCIUM 8.0*   PT/INR No results for input(s): LABPROT, INR in the last 72 hours. ABG No results for input(s): PHART, HCO3 in the last 72 hours.  Invalid input(s): PCO2, PO2  Studies/Results: No results found.  Hospital notes and labs  reviewed.   Case discussed with Dr. Excell Seltzer.   Assessment: Necrotizing fasciitis involving the perineum and bulbospongiosus muscle.  He will need cystoscopy and consideration of SP tube insertion at his second look procedure that is being considered for Weds.    I have reviewed the procedures and the added risks of bleeding, infection and injury to adjacent organs such as the bowel.      CC: Dr. Adonis Housekeeper.      Yisroel Mullendore J 03/23/2017 [phone]

## 2017-03-23 NOTE — Anesthesia Preprocedure Evaluation (Addendum)
Anesthesia Evaluation  Patient identified by MRN, date of birth, ID band Patient awake    Reviewed: Allergy & Precautions, NPO status , Patient's Chart, lab work & pertinent test results  Airway Mallampati: I  TM Distance: >3 FB Neck ROM: Full    Dental  (+) Dental Advisory Given, Poor Dentition, Chipped, Missing   Pulmonary    Pulmonary exam normal        Cardiovascular hypertension, Pt. on medications Normal cardiovascular exam     Neuro/Psych    GI/Hepatic   Endo/Other  diabetes, Type 2, Insulin Dependent  Renal/GU      Musculoskeletal   Abdominal   Peds  Hematology   Anesthesia Other Findings   Reproductive/Obstetrics                            Anesthesia Physical Anesthesia Plan  ASA: III  Anesthesia Plan: General   Post-op Pain Management:    Induction: Intravenous  PONV Risk Score and Plan: 2 and Ondansetron and Dexamethasone  Airway Management Planned: LMA  Additional Equipment:   Intra-op Plan:   Post-operative Plan: Extubation in OR  Informed Consent: I have reviewed the patients History and Physical, chart, labs and discussed the procedure including the risks, benefits and alternatives for the proposed anesthesia with the patient or authorized representative who has indicated his/her understanding and acceptance.     Plan Discussed with: CRNA and Surgeon  Anesthesia Plan Comments:         Anesthesia Quick Evaluation

## 2017-03-23 NOTE — ED Notes (Signed)
Bed: WA07 Expected date:  Expected time:  Means of arrival:  Comments: EMS- wounds

## 2017-03-23 NOTE — Anesthesia Postprocedure Evaluation (Signed)
Anesthesia Post Note  Patient: Francisco Hutchinson  Procedure(s) Performed: Procedure(s) (LRB): IRRIGATION AND DEBRIDEMENT LEFT BUTTOCK ABSCESS (Left)     Patient location during evaluation: PACU Anesthesia Type: General Level of consciousness: awake and alert Pain management: pain level controlled Vital Signs Assessment: post-procedure vital signs reviewed and stable Respiratory status: spontaneous breathing, nonlabored ventilation, respiratory function stable and patient connected to nasal cannula oxygen Cardiovascular status: blood pressure returned to baseline and stable Postop Assessment: no signs of nausea or vomiting Anesthetic complications: no    Last Vitals:  Vitals:   03/23/17 1800 03/23/17 1815  BP: (!) 144/85 (!) 147/89  Pulse: 95 95  Resp: 14 12  Temp:    SpO2: 100% 100%    Last Pain:  Vitals:   03/23/17 1745  TempSrc:   PainSc: Sweeny DAVID

## 2017-03-23 NOTE — Op Note (Signed)
Preoperative Diagnosis: abscess left buttock abscess  Postoprative Diagnosis: same with necrotizing fasciitis involving left buttock, perineum and groin Procedure: Procedure(s): Incision and drainage and extensive fascial and soft tissue debridement left buttock, perineum and groin   Surgeon: Excell Seltzer T   Assistants: none  Anesthesia:  General endotracheal anesthesia  Indications: patient is a 46 year old male with multiple chronic medical conditions including HIV and history of lymphoma, previous history of necrotizing fasciitis of upper extremity. He presents with at least several days of increasing pain and drainage of his left buttock. Examination revealsan open wound in the left buttock with copious foul-smelling drainage and erythema and edema involving the left buttock and extending somewhat up into the perineum and the base of the left hemi scrotum. I have recommended incision and drainage and debridement under general anesthesia in the operating room. I discussed the nature of the procedure and the uncertainty of how much infection we would find and the extent of the operation. We discussed risks of general anesthesia, bleeding, infection. He does he will have an open wound. He agrees to proceed.    Procedure Detail:  Patient was brought to the operating room, placed in the supine position on the operating table, and general endotracheal anesthesia induced. He had received broad spectrum IV antibiotics. He was carefully positioned in lithotomy position. The entire by Dr. And perineum and genitalia were widely sterilely prepped and draped. Patient timeout was performed and correct procedure verified. He was already on broad-spectrum IV antibiotics. Initially through the open wound in the left buttock I debrided some necrotic subcutaneous tissue and there was copious purulent drainage. This was cultured. Examination of the wound showed it communicated posteriorly to a second opening  and the skin was excised around these 2 openings for about 5 cm and a large amount of pus and necrotic superficial fascia was debrided. There was drainage from the deeper perirectal space and the dissection was carried down into the left perirectal space where there was again copious purulence drained and necrotic perirectal fat which was debrided up for about 8 cm in the perirectal space. There was necrotic fascia overlying the gluteus muscle that was debrided. Necrotic subcutaneous tissue posteriorly was debrided with sharp and blunt dissection and cautery. There appeared to be more purulent drainage coming from anteriorly and I extended the incision anteriorly up toward the perineum encountering more purulent material and necrotic superficial fascia that was debrided. At this point with pressure on the left groin and left hemiscrotum there was copious purulent drainage still coming from the superior portion of the wound. With sterile technique I placed a Foley catheter. I asked for urology consult and Dr. Roni Bread came into the room and we discussed placement of the incision for further exposure. Feeling the open area anteriorly this tracked just adjacent to the base of the scrotum up into the left groin and this was opened further anteriorly up to the anterior portion of the base of the scrotum. This resulted in about a 20 cm long incision. Again found was necrotic superficial fascia and fat. However there was a second area that tracked more deeply through the superficial muscle in the left perineum and this was incised with another large area of necrotic tissue found that extended down into the perineum and around the urethra which I could feel the Foley catheter with superficial and deep necrotic fascia which was carefully debrided. This extended down around the bulbar urethra and great care was taken to avoid injury to the  urethra which was protected but there was still some superficial necrotic tissue very close  to the urethra which I did not debride. Necrotic tissue extended down around the ischial tuberosity and up toward the left groin and pubis which was all widely opened and debrided again with mostly blunt but some cautery dissection. At this point it appeared that the vast majority of necrotic tissue had been excised and I did not find any more tracking in any direction. Hemostasis was assured. The wound was packed open with 2 full moist Kerlix gauze. Further dressings were applied and patient was taken to recovery in stable condition. Plan is to return the patient to the operating room in 36 hours for further exploration and dressing change.    Findings: As above  Estimated Blood Loss:  100 mL         Drains: wound widely packed with moist gauze  Blood Given: none          Specimens: necrotic skin and subcutaneous tissue and fascia, culture and sensitivity        Complications:  * No complications entered in OR log *         Disposition: PACU - hemodynamically stable.         Condition: stable

## 2017-03-23 NOTE — ED Provider Notes (Signed)
[facility] DEPT Provider Note   CSN: [id number] Arrival date & time: 03/23/17  1116     History   Chief Complaint Chief Complaint  Patient presents with  . Abscess    HPI Francisco Hutchinson is a 46 y.o. male.  HPI Patient presents with left buttock abscess. Has had it for the last week but for the last 2 days his been draining. States pus is been coming out now. Previous history of necrotizing fasciitis on the arm 2 years ago. Couple located by strokes. At baseline he gets around with a wheelchair. Not recently on antibiotics. History of HIV and diabetes. He is on treatment for his HIV but does not know what his CD4 count is. States he has had abscesses in other spots also. Fevers of up to 102 at home.  Past Medical History:  Diagnosis Date  . Diabetes mellitus without complication ([place])   . HIV (human immunodeficiency virus infection) ([place])   . Hypertension   . Stroke [facility])     Patient Active Problem List   Diagnosis Date Noted  . Abscess of buttock, left 03/23/2017    Past Surgical History:  Procedure Laterality Date  . abscess removed from back and left armpit    . Surgery Right Arm         Home Medications    Prior to Admission medications   Medication Sig Start Date End Date Taking? Authorizing Provider  ACETAMINOPHEN PO Take 2 tablets by mouth daily as needed (fever and mild pain).   Yes [provider]  amLODipine (NORVASC) 5 MG tablet Take 5 mg by mouth daily.  03/03/17  Yes [provider]  aspirin EC 81 MG tablet Take 81 mg by mouth at bedtime.   Yes [provider]  DESCOVY 200-25 MG tablet Take 1 tablet by mouth daily. 03/19/17  Yes [provider]  fenofibrate 160 MG tablet Take 80 mg by mouth at bedtime.  01/20/17  Yes [provider]  hydrochlorothiazide (HYDRODIURIL) 25 MG tablet Take 25 mg by mouth daily.  02/18/17  Yes [provider]  LANTUS SOLOSTAR 100 UNIT/ML Solostar Pen Inject 30 Units  into the skin at bedtime.  03/03/17  Yes [provider]  metoprolol tartrate (LOPRESSOR) 100 MG tablet Take 100 mg by mouth 2 (two) times daily.  01/26/17  Yes [provider]  sertraline (ZOLOFT) 50 MG tablet Take 50 mg by mouth at bedtime.  02/18/17  Yes [provider]  TIVICAY 50 MG tablet Take 50 mg by mouth at bedtime.  03/19/17  Yes [provider]  TRULICITY 1.5 ER/7.4YC SOPN Inject 1.5 mg into the skin once a week. On Sunday 02/27/17  Yes [provider]    Family History History reviewed. No pertinent family history.  Social History Social History  Substance Use Topics  . Smoking status: Never Smoker  . Smokeless tobacco: Never Used  . Alcohol use No     Allergies   Patient has no known allergies.   Review of Systems Review of Systems  Constitutional: Positive for fatigue and fever.  HENT: Positive for congestion.   Respiratory: Positive for cough. Negative for shortness of breath.   Cardiovascular: Negative for chest pain.  Gastrointestinal: Negative for abdominal pain.  Endocrine: Negative for polyuria.  Genitourinary: Negative for scrotal swelling and testicular pain.  Musculoskeletal: Negative for back pain.  Skin: Positive for wound.  Neurological: Positive for weakness.  Psychiatric/Behavioral: Negative for confusion.     Physical  Exam Updated Vital Signs BP 117/62   Pulse 66   Temp 99.3 F (37.4 C) (Oral)   Resp 18   SpO2 98%   Physical Exam  Constitutional: He appears well-developed.  Neck: Neck supple.  Cardiovascular: Normal rate.   Pulmonary/Chest: Effort normal.  Abdominal: There is no tenderness.  Musculoskeletal:  Somewhat atrophied bilateral lower legs.  Neurological: He is alert.  Skin: Skin is warm.  Left buttock has abscess posteriorly and inferiorly. Large area of fluctuance. There is a central hole with moderate to severe purulent drainage. Perineum does not appear to be involved.      ED Treatments / Results  Labs (all labs ordered are listed, but only abnormal results are displayed) Labs Reviewed  COMPREHENSIVE METABOLIC PANEL - Abnormal; Notable for the following:       Result Value   Sodium 133 (*)    Chloride 97 (*)    Glucose, Bld 121 (*)    BUN 28 (*)    Creatinine, Ser 1.45 (*)    Calcium 8.0 (*)    Albumin 2.1 (*)    GFR calc non Af Amer 57 (*)    All other components within normal limits  GLUCOSE, CAPILLARY - Abnormal; Notable for the following:    Glucose-Capillary 103 (*)    All other components within normal limits  CBC WITH DIFFERENTIAL/PLATELET  T-HELPER CELLS (CD4) COUNT (NOT AT [facility])  HIV 1 RNA QUANT-NO REFLEX-BLD  I-STAT CG4 LACTIC ACID, ED    EKG  EKG Interpretation None       Radiology No results found.  Procedures Procedures (including critical care time)  Medications Ordered in ED Medications  vancomycin (VANCOCIN) IVPB 1000 mg/200 mL premix ( Intravenous MAR Hold 03/23/17 1421)  vancomycin (VANCOCIN) 1-5 GM/200ML-% IVPB (not administered)  piperacillin-tazobactam (ZOSYN) IVPB 3.375 g (0 g Intravenous Stopped 03/23/17 1353)  sodium chloride 0.9 % bolus 500 mL (500 mLs Intravenous New Bag/Given 03/23/17 1247)     Initial Impression / Assessment and Plan / ED Course  I have reviewed the triage vital signs and the nursing notes.  Pertinent labs & imaging results that were available during my care of the patient were reviewed by me and considered in my medical decision making (see chart for details).     Patient with large buttock abscess. Previous necrotizing fasciitis. Seen in the ER by general surgery. Dr. Excell Seltzer saw the patient and thinks does not need a CT since he will be going to the OR to explore. Admit to internal medicine. HIV viral load and CD4 count sent due to known HIV disease.  Final Clinical Impressions(s) / ED Diagnoses   Final diagnoses:  Abscess of buttock, left    New Prescriptions Current  Discharge Medication List       Davonna Belling, MD 03/23/17 1439

## 2017-03-23 NOTE — ED Triage Notes (Addendum)
Per EMS, patient from home, c/o abscess to buttocks x2 days. Reports drainage and fever to wound. Ambulatory. Hx HIV.  BP 142/80 HR 102 RR 18 O2 98% RA CBG 134

## 2017-03-23 NOTE — Progress Notes (Signed)
Pharmacy Antibiotic Note  Francisco Hutchinson is a 46 y.o. male admitted on 03/23/2017 with multiple chronic medical conditions including HIV and history of lymphoma.  Presents with copious foul-smelling drainage and erythema and edema involving the left buttock.   During debridement it was discovered to be perirectal and perineal necrotizing fasciitis that involved the periurethral area around the bulb.  Pharmacy has been consulted for vancomycin and zosyn dosing for cellulitis.  Scr 1.45, CrCl ~ 74mls/min (N)  Plan: Vancomycin 1gm IV x1 in OR then continue q12h Zosyn 3.375g IV Q8H infused over 4hrs. Daily Scr Follow cultures, renal function and clinical course    Temp (24hrs), Avg:99.6 F (37.6 C), Min:99.3 F (37.4 C), Max:100 F (37.8 C)   Recent Labs Lab 03/23/17 1343 03/23/17 1347  WBC  --  29.1*  CREATININE  --  1.45*  LATICACIDVEN 1.38  --     CrCl cannot be calculated (Unknown ideal weight.).    No Known Allergies  Antimicrobials this admission: 8/13 vancomycin >> 8/13 zosyn >>  Dose adjustments this admission:   Microbiology results: 8/13 Abscess:  Thank you for allowing pharmacy to be a part of this patient's care.  Dolly Rias RPh 03/23/2017, 8:12 PM Pager [phone]

## 2017-03-23 NOTE — Anesthesia Procedure Notes (Addendum)
Date/Time: 03/23/2017 4:39 PM Performed by: Cynda Familia Oxygen Delivery Method: Simple face mask Placement Confirmation: breath sounds checked- equal and bilateral and positive ETCO2 Dental Injury: Teeth and Oropharynx as per pre-operative assessment  Comments: Extubated to simple face mask--- good AW-- to PACU with O2 intact

## 2017-03-23 NOTE — Anesthesia Procedure Notes (Signed)
Procedure Name: Intubation Date/Time: 03/23/2017 3:17 PM Performed by: Cynda Familia Pre-anesthesia Checklist: Patient identified, Emergency Drugs available, Suction available and Patient being monitored Patient Re-evaluated:Patient Re-evaluated prior to induction Oxygen Delivery Method: Circle System Utilized Preoxygenation: Pre-oxygenation with 100% oxygen Induction Type: IV induction Ventilation: Mask ventilation without difficulty Laryngoscope Size: Miller and 2 Tube type: Oral Number of attempts: 1 Airway Equipment and Method: Stylet Placement Confirmation: ETT inserted through vocal cords under direct vision,  positive ETCO2 and breath sounds checked- equal and bilateral Secured at: 21 cm Tube secured with: Tape Dental Injury: Teeth and Oropharynx as per pre-operative assessment  Comments: Smooth RSI--- [place]--- intubation AM CRNA atraumatic--- chipped front teeth many broken missing teeth--- pt did not report on interview-- teeth and mouth as preop--- bilat BS [place]

## 2017-03-23 NOTE — Consult Note (Signed)
[facility] Surgery Consult/Admission Note  Brand Siever [facility] 03/10/1971  [id number].    Requesting MD: Dr. Alvino Chapel Chief Complaint/Reason for Consult: abscess  HPI:  Pt is a 46 year old male with a history of HIV, DM, HTN, stroke, necrotizing fascitis of his RUE which resulted in sepsis and a trach who presented to the [facility] ED with complaints of pain and drainage from left butt cheek. Pt states he noticed pain and a lump on his left butt cheek about a week ago. It progressively got bigger and more painful until it started to drain foul purulent drainage yesterday. Pt states mild constant pain, worse with touch or sitting on butt. Pain does not radiate. Pt states associated subjective fevers and 2 episodes of emesis 3 days ago. Pt denies other associated symptoms. He states no issues with BM's and no blood in his stools. No urinary symptoms. Pt denies abdominal pain, CP, SOB. Pt is not on anticoagulation therapy. His last meal was last night.   ROS:  Review of Systems  Constitutional: Positive for fever (subjective). Negative for diaphoresis.  Respiratory: Negative for shortness of breath.   Cardiovascular: Negative for chest pain.  Gastrointestinal: Positive for vomiting. Negative for abdominal pain, blood in stool, constipation, diarrhea and nausea.  Genitourinary: Negative for dysuria and hematuria.       Abscess to left buttock  Neurological: Negative for dizziness and loss of consciousness.  All other systems reviewed and are negative.    No family history on file.  Past Medical History:  Diagnosis Date  . Diabetes mellitus without complication ([place])   . HIV (human immunodeficiency virus infection) ([place])   . Hypertension   . Stroke [facility])     History reviewed. No pertinent surgical history.  Social History:  has no tobacco, alcohol, and drug history on file.  Allergies: Not on File   (Not in a hospital admission)  Blood pressure 140/85, pulse (!) 102,  temperature 99.3 F (37.4 C), temperature source Oral, resp. rate 20, SpO2 100 %.  Physical Exam  Constitutional: He is oriented to person, place, and time. No distress.  In no acute distress, well appearing  HENT:  Head: Normocephalic.  Nose: Nose normal.  Mouth/Throat: Oropharynx is clear and moist. No oropharyngeal exudate.  Eyes: Right eye exhibits no discharge. Left eye exhibits no discharge. No scleral icterus.  Pupils are equal b/l  Neck: Normal range of motion. Neck supple. No tracheal deviation present. No thyromegaly present.  Cardiovascular: Normal rate, regular rhythm and normal heart sounds.  Exam reveals no gallop.   No murmur heard. Pulses:      Radial pulses are 2+ on the right side, and 2+ on the left side.  Pulmonary/Chest: Effort normal and breath sounds normal. No respiratory distress. He has no wheezes. He has no rhonchi. He has no rales.  Abdominal: Soft. Normal appearance and bowel sounds are normal. There is no hepatosplenomegaly. There is no tenderness.  Genitourinary:  Genitourinary Comments: Large area of erythema and induration noted to left medial buttock with copious purulent drainage. TTP  Musculoskeletal: He exhibits no edema or tenderness.  Atrophy noted to BUE and BLE, small ulcer looking wounds to right elbow and forearm  Neurological: He is alert and oriented to person, place, and time.  Skin: Skin is warm and dry. He is not diaphoretic.  Psychiatric: Mood and affect normal.  Nursing note and vitals reviewed.   No results found for this or any previous visit (from the past 48 hour(s)).  No results found.    Assessment/Plan  HIV - CD4 pending - pt states he is followed by Dr. Nyra Capes for this and that he takes his meds  Abscess of left buttock - OR today for I&D with Dr. Excell Seltzer - vanc and zosyn  Kalman Drape, [facility] Surgery 03/23/2017, 12:23 PM Pager: [phone] Consults: [phone] Mon-Fri 7:00 am-4:30  pm Sat-Sun 7:00 am-11:30 am

## 2017-03-23 NOTE — Transfer of Care (Signed)
Immediate Anesthesia Transfer of Care Note  Patient: Francisco Hutchinson  Procedure(s) Performed: Procedure(s): IRRIGATION AND DEBRIDEMENT LEFT BUTTOCK ABSCESS (Left)  Patient Location: PACU  Anesthesia Type:General  Level of Consciousness: sedated  Airway & Oxygen Therapy: Patient Spontanous Breathing and Patient connected to face mask oxygen  Post-op Assessment: Report given to RN and Post -op Vital signs reviewed and stable  Post vital signs: Reviewed and stable  Last Vitals:  Vitals:   03/23/17 1134 03/23/17 1354  BP: 140/85 117/62  Pulse: (!) 102 66  Resp: 20 18  Temp: 37.4 C   SpO2: 100% 98%    Last Pain:  Vitals:   03/23/17 1135  TempSrc:   PainSc: 2          Complications: No apparent anesthesia complications

## 2017-03-24 ENCOUNTER — Encounter (HOSPITAL_COMMUNITY): Payer: Self-pay | Admitting: General Surgery

## 2017-03-24 DIAGNOSIS — E1122 Type 2 diabetes mellitus with diabetic chronic kidney disease: Secondary | ICD-10-CM

## 2017-03-24 DIAGNOSIS — Z794 Long term (current) use of insulin: Secondary | ICD-10-CM

## 2017-03-24 DIAGNOSIS — N289 Disorder of kidney and ureter, unspecified: Secondary | ICD-10-CM

## 2017-03-24 DIAGNOSIS — I129 Hypertensive chronic kidney disease with stage 1 through stage 4 chronic kidney disease, or unspecified chronic kidney disease: Secondary | ICD-10-CM

## 2017-03-24 DIAGNOSIS — Z8673 Personal history of transient ischemic attack (TIA), and cerebral infarction without residual deficits: Secondary | ICD-10-CM

## 2017-03-24 LAB — BASIC METABOLIC PANEL
Anion gap: 9 (ref 5–15)
BUN: 28 mg/dL — AB (ref 6–20)
CHLORIDE: 95 mmol/L — AB (ref 101–111)
CO2: 27 mmol/L (ref 22–32)
CREATININE: 1.47 mg/dL — AB (ref 0.61–1.24)
Calcium: 7.6 mg/dL — ABNORMAL LOW (ref 8.9–10.3)
GFR calc Af Amer: >60 mL/min (ref >60)
GFR calc non Af Amer: 56 mL/min — ABNORMAL LOW (ref >60)
GLUCOSE: 220 mg/dL — AB (ref 65–99)
Potassium: 3.4 mmol/L — ABNORMAL LOW (ref 3.5–5.1)
Sodium: 131 mmol/L — ABNORMAL LOW (ref 135–145)

## 2017-03-24 LAB — GLUCOSE, CAPILLARY
GLUCOSE-CAPILLARY: 176 mg/dL — AB (ref 65–99)
GLUCOSE-CAPILLARY: 262 mg/dL — AB (ref 65–99)
GLUCOSE-CAPILLARY: 321 mg/dL — AB (ref 65–99)
Glucose-Capillary: 191 mg/dL — ABNORMAL HIGH (ref 65–99)
Glucose-Capillary: 270 mg/dL — ABNORMAL HIGH (ref 65–99)

## 2017-03-24 LAB — CBC
HCT: 34.3 % — ABNORMAL LOW (ref 39.0–52.0)
Hemoglobin: 11.6 g/dL — ABNORMAL LOW (ref 13.0–17.0)
MCH: 26.9 pg (ref 26.0–34.0)
MCHC: 33.8 g/dL (ref 30.0–36.0)
MCV: 79.4 fL (ref 78.0–100.0)
Platelets: 386 K/uL (ref 150–400)
RBC: 4.32 MIL/uL (ref 4.22–5.81)
RDW: 18.7 % — ABNORMAL HIGH (ref 11.5–15.5)
WBC: 26.3 K/uL — ABNORMAL HIGH (ref 4.0–10.5)

## 2017-03-24 LAB — T-HELPER CELLS (CD4) COUNT (NOT AT ARMC)
CD4 % Helper T Cell: 29 % — ABNORMAL LOW (ref 33–55)
CD4 T Cell Abs: 310 /uL — ABNORMAL LOW (ref 400–2700)

## 2017-03-24 LAB — HIV-1 RNA QUANT-NO REFLEX-BLD
HIV 1 RNA Quant: 50 copies/mL
LOG10 HIV-1 RNA: 1.699 {Log_copies}/mL

## 2017-03-24 MED ORDER — ORAL CARE MOUTH RINSE
15.0000 mL | Freq: Two times a day (BID) | OROMUCOSAL | Status: DC
  Administered 2017-03-24 – 2017-04-01 (×13): 15 mL via OROMUCOSAL

## 2017-03-24 MED ORDER — MUPIROCIN 2 % EX OINT
1.0000 "application " | TOPICAL_OINTMENT | Freq: Two times a day (BID) | CUTANEOUS | Status: AC
  Administered 2017-03-24 – 2017-03-28 (×10): 1 via NASAL
  Filled 2017-03-24 (×2): qty 22

## 2017-03-24 MED ORDER — CHLORHEXIDINE GLUCONATE CLOTH 2 % EX PADS
6.0000 | MEDICATED_PAD | Freq: Every day | CUTANEOUS | Status: AC
  Administered 2017-03-24 – 2017-03-28 (×5): 6 via TOPICAL

## 2017-03-24 NOTE — Progress Notes (Signed)
Patient ID: Francisco Hutchinson, male   DOB: Aug 28, 1970, 46 y.o.   MRN: [id number] 1 Day Post-Op   Subjective: Patient very comfortable. Minimal pain. Tolerating diet well.  Objective: Vital signs in last 24 hours: Temp:  [97.5 F (36.4 C)-100 F (37.8 C)] 98.8 F (37.1 C) (08/14 0800) Pulse Rate:  [58-102] 58 (08/14 0800) Resp:  [11-29] 16 (08/14 0800) BP: (108-148)/(62-94) 130/85 (08/14 0800) SpO2:  [87 %-100 %] 99 % (08/14 0800) Weight:  [102.7 kg (226 lb 6.6 oz)] 102.7 kg (226 lb 6.6 oz) (08/13 2020)    Intake/Output from previous day: 08/13 0701 - 08/14 0700 In: 3240 [P.O.:240; I.V.:2750; IV Piggyback:250] Out: 1055 [Urine:955; Blood:100] Intake/Output this shift: No intake/output data recorded.  General appearance: alert, cooperative and no distress Incision/Wound: perineal dressing intact with minimal serous drainage.  Lab Results:   Recent Labs  03/23/17 1347 03/24/17 0319  WBC 29.1* 26.3*  HGB 12.4* 11.6*  HCT 36.1* 34.3*  PLT 436* 386   BMET  Recent Labs  03/23/17 1347 03/24/17 0319  NA 133* 131*  K 3.8 3.4*  CL 97* 95*  CO2 28 27  GLUCOSE 121* 220*  BUN 28* 28*  CREATININE 1.45* 1.47*  CALCIUM 8.0* 7.6*     Studies/Results: No results found.  Anti-infectives: Anti-infectives    Start     Dose/Rate Route Frequency Ordered Stop   03/24/17 1000  emtricitabine-tenofovir AF (DESCOVY) 200-25 MG per tablet 1 tablet     1 tablet Oral Daily 03/23/17 2020     03/24/17 0200  vancomycin (VANCOCIN) IVPB 1000 mg/200 mL premix     1,000 mg 200 mL/hr over 60 Minutes Intravenous Every 12 hours 03/23/17 2110     03/23/17 2100  dolutegravir (TIVICAY) tablet 50 mg     50 mg Oral Daily 03/23/17 2020     03/23/17 2030  piperacillin-tazobactam (ZOSYN) IVPB 3.375 g     3.375 g 12.5 mL/hr over 240 Minutes Intravenous Every 8 hours 03/23/17 2027     03/23/17 1428  vancomycin (VANCOCIN) 1-5 GM/200ML-% IVPB    Comments:  [place], [place]   : cabinet override      03/23/17 1428 03/23/17 1518   03/23/17 1215  piperacillin-tazobactam (ZOSYN) IVPB 3.375 g     3.375 g 100 mL/hr over 30 Minutes Intravenous  Once 03/23/17 1211 03/23/17 1353   03/23/17 1215  vancomycin (VANCOCIN) IVPB 1000 mg/200 mL premix     1,000 mg 200 mL/hr over 60 Minutes Intravenous  Once 03/23/17 1211 03/23/17 1518      Assessment/Plan: Necrotizing soft tissue infection left buttock and perineum, status post extensive debridement. Patient appears stable/improved. Plan return to operating room For dressing change and wound exploration in conjunction with urology.  Discussed with patient and family.    LOS: 1 day    Robi Dewolfe T 03/24/2017

## 2017-03-24 NOTE — Care Management Note (Signed)
Case Management Note  Patient Details  Name: Francisco Hutchinson MRN: [id number] Date of Birth: 08/07/1971  Subjective/Objective:       abcess of the buttock and systemic infection             Action/Plan:Date:  March 24, 2017  Chart reviewed for concurrent status and case management needs.  Will continue to follow patient progress.  Discharge Planning: following for needs  Expected discharge date: [id number]  Velva Harman, BSN, [place], [place]    Expected Discharge Date:   (unknown)               Expected Discharge Plan:  Home/Self Care  In-House Referral:     Discharge planning Services  CM Consult  Post Acute Care Choice:    Choice offered to:     DME Arranged:    DME Agency:     HH Arranged:    [place] Agency:     Status of Service:  In process, will continue to follow  If discussed at Long Length of Stay Meetings, dates discussed:    Additional Comments:  Leeroy Cha, RN 03/24/2017, 9:20 AM

## 2017-03-24 NOTE — Progress Notes (Signed)
Night shift update  Pt. Had an overall restful night.VSS. CBG =204 with night time coverage with Lantus & Novolog. Around 5 am, pts. HR 57-60 range. BP 116/72. Dressing appears intact with old drainage. Will continue to monitor.

## 2017-03-24 NOTE — Consult Note (Signed)
[place]ious [facility]  Total days of antibiotics 2        Day 2 vanco        Day 2 piptazo              Reason for Consult: hiv disease    Referring Physician: hijazi  Active Problems:   Abscess of buttock, left   Status post incision and drainage   Necrotizing fasciitis ([place])   Renal insufficiency    HPI: Francisco Hutchinson is a 46 y.o. male HIV disease, well controlled CD 4 count 368, VL < 20. 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 He was last seen at the HIV clinic at [facility] on April 30th - seen by dr Megan Salon for routine visit. On tivicay/descovy, doing well at that time.  He reports having drainage from ? Cyst on his buttock that progressively worsened over the last 2 wks. He also subscribed to fevers and N/V in the last few days. He was admitted on 8/13 and taken emergently to the OR by dr Excell Seltzer for I x D of perineal abscess. OR note describes that the infectious process was much more extensive, requiring 20cm long incision. Necrotic tissue down to ischial tuberosity and upward toward left groin and pubis and necrotic tissue requiring to be excised as well as along urethra. He was empirically started on vanco and piptazo. Hemodynamically stable but WBC of 29K with toxic granulations  Past Medical History:  Diagnosis Date  . Diabetes mellitus without complication ([place])   . HIV (human immunodeficiency virus infection) ([place])   . Hypertension   . Stroke ([place])    - depression on zoloft - metropolol '100mg'$  bid - lasix '20mg'$  daily  Allergies: No Known Allergies  MEDICATIONS: . amLODipine  5 mg Oral Daily  . Chlorhexidine Gluconate Cloth  6 each Topical Q0600  . dolutegravir  50 mg Oral Daily  . emtricitabine-tenofovir AF  1 tablet Oral Daily  . fenofibrate  160 mg Oral Daily  . heparin  5,000 Units Subcutaneous Q8H  .  hydrochlorothiazide  25 mg Oral Daily  . insulin aspart  0-15 Units Subcutaneous TID WC  . insulin aspart  0-5 Units Subcutaneous QHS  . insulin glargine  20 Units Subcutaneous QHS  . mouth rinse  15 mL Mouth Rinse BID  . metoprolol tartrate  100 mg Oral Daily  . mupirocin ointment  1 application Nasal BID  . sertraline  50 mg Oral Daily    Social History  Substance Use Topics  . Smoking status: Never Smoker  . Smokeless tobacco: Never Used  . Alcohol use No    Family hx: DM  Review of Systems  Constitutional: +for fever, chills, diaphoresis, but now improved activity change, appetite change, fatigue as well as unexpected weight loss. HENT: Negative for congestion, sore throat, rhinorrhea, sneezing, trouble swallowing and sinus pressure.  Eyes: Negative for photophobia and visual disturbance.  Respiratory: Negative for cough, chest tightness, shortness of breath, wheezing and stridor.  Cardiovascular: Negative for chest pain, palpitations and leg swelling.  Gastrointestinal: Negative for nausea, vomiting, abdominal pain, diarrhea, constipation, blood in stool, abdominal distention and anal bleeding.  Genitourinary: Negative for dysuria, hematuria, flank pain and difficulty urinating.  Musculoskeletal: Negative for myalgias, back pain, joint swelling, arthralgias and gait problem.  Skin: + wound on buttocks Neurological: Negative for dizziness,  tremors, weakness and light-headedness.  Hematological: Negative for adenopathy. Does not bruise/bleed easily.  Psychiatric/Behavioral: Negative for behavioral problems, confusion, sleep disturbance, dysphoric mood, decreased concentration and agitation.     OBJECTIVE: Temp:  [97.5 F (36.4 C)-100 F (37.8 C)] 99 F (37.2 C) (08/14 1200) Pulse Rate:  [58-100] 76 (08/14 1000) Resp:  [11-29] 24 (08/14 1000) BP: (108-148)/(62-94) 128/84 (08/14 1000) SpO2:  [87 %-100 %] 99 % (08/14 1000) Weight:  [226 lb 6.6 oz (102.7 kg)] 226 lb 6.6 oz  (102.7 kg) (08/13 2020) Physical Exam  Constitutional: He is oriented to person, place, and time. He appears well-developed and well-nourished. No distress.  HENT:  Mouth/Throat: Oropharynx is clear and moist. No oropharyngeal exudate.  Cardiovascular: Normal rate, regular rhythm and normal heart sounds. Exam reveals no gallop and no friction rub.  No murmur heard.  Pulmonary/Chest: Effort normal and breath sounds normal. No respiratory distress. He has no wheezes.  Abdominal: Soft. Bowel sounds are normal. He exhibits no distension. There is no tenderness.  Lymphadenopathy:  He has no cervical adenopathy.  Neurological: He is alert and oriented to person, place, and time.  Skin: Skin is warm and dry. No rash noted. No erythema.  Psychiatric: He has a normal mood and affect. His behavior is normal.     LABS: Results for orders placed or performed during the hospital encounter of 03/23/17 (from the past 48 hour(s))  I-Stat CG4 Lactic Acid, ED     Status: None   Collection Time: 03/23/17  1:43 PM  Result Value Ref Range   Lactic Acid, Venous 1.38 0.5 - 1.9 mmol/L  CBC with Differential     Status: Abnormal   Collection Time: 03/23/17  1:47 PM  Result Value Ref Range   WBC 29.1 (H) 4.0 - 10.5 K/uL    Comment: WHITE COUNT CONFIRMED ON SMEAR   RBC 4.55 4.22 - 5.81 MIL/uL   Hemoglobin 12.4 (L) 13.0 - 17.0 g/dL   HCT 58.1 (L) 36.2 - 52.2 %   MCV 79.3 78.0 - 100.0 fL   MCH 27.3 26.0 - 34.0 pg   MCHC 34.3 30.0 - 36.0 g/dL   RDW 61.6 (H) 74.6 - 28.2 %   Platelets 436 (H) 150 - 400 K/uL   Neutrophils Relative % 91 %   Lymphocytes Relative 4 %   Monocytes Relative 5 %   Eosinophils Relative 0 %   Basophils Relative 0 %   Neutro Abs 26.4 (H) 1.7 - 7.7 K/uL   Lymphs Abs 1.2 0.7 - 4.0 K/uL   Monocytes Absolute 1.5 (H) 0.1 - 1.0 K/uL   Eosinophils Absolute 0.0 0.0 - 0.7 K/uL   Basophils Absolute 0.0 0.0 - 0.1 K/uL   WBC Morphology TOXIC GRANULATION     Comment: DOHLE BODIES    Comprehensive metabolic panel     Status: Abnormal   Collection Time: 03/23/17  1:47 PM  Result Value Ref Range   Sodium 133 (L) 135 - 145 mmol/L   Potassium 3.8 3.5 - 5.1 mmol/L   Chloride 97 (L) 101 - 111 mmol/L   CO2 28 22 - 32 mmol/L   Glucose, Bld 121 (H) 65 - 99 mg/dL   BUN 28 (H) 6 - 20 mg/dL   Creatinine, Ser 8.66 (H) 0.61 - 1.24 mg/dL   Calcium 8.0 (L) 8.9 - 10.3 mg/dL   Total Protein 7.4 6.5 - 8.1 g/dL   Albumin 2.1 (L) 3.5 - 5.0 g/dL   AST 22 15 - 41 U/L  ALT 20 17 - 63 U/L   Alkaline Phosphatase 99 38 - 126 U/L   Total Bilirubin 0.8 0.3 - 1.2 mg/dL   GFR calc non Af Amer 57 (L) >60 mL/min   GFR calc Af Amer >60 >60 mL/min    Comment: (NOTE) The eGFR has been calculated using the CKD EPI equation. This calculation has not been validated in all clinical situations. eGFR's persistently <60 mL/min signify possible Chronic Kidney Disease.    Anion gap 8 5 - 15  Glucose, capillary     Status: Abnormal   Collection Time: 03/23/17  2:29 PM  Result Value Ref Range   Glucose-Capillary 103 (H) 65 - 99 mg/dL  Aerobic/Anaerobic Culture (surgical/deep wound)     Status: None (Preliminary result)   Collection Time: 03/23/17  3:44 PM  Result Value Ref Range   Specimen Description ABSCESS LEFT BUTTOCKS    Special Requests NONE    Gram Stain      ABUNDANT WBC PRESENT, PREDOMINANTLY PMN FEW GRAM NEGATIVE RODS MODERATE GRAM POSITIVE COCCI IN PAIRS    Culture      CULTURE REINCUBATED FOR BETTER GROWTH Performed at [facility], [place] [address] [place], [place] [address]    Report Status PENDING   Glucose, capillary     Status: Abnormal   Collection Time: 03/23/17  4:54 PM  Result Value Ref Range   Glucose-Capillary 109 (H) 65 - 99 mg/dL  MRSA PCR Screening     Status: Abnormal   Collection Time: 03/23/17  9:00 PM  Result Value Ref Range   MRSA by PCR POSITIVE (A) NEGATIVE    Comment:        The GeneXpert MRSA Assay (FDA approved for NASAL specimens only), is  one component of a comprehensive MRSA colonization surveillance program. It is not intended to diagnose MRSA infection nor to guide or monitor treatment for MRSA infections. RESULT CALLED TO, READ BACK BY AND VERIFIED WITH: KOTIAN,S RN 8.13.18 '@2311'$  ZANDO,C   Glucose, capillary     Status: Abnormal   Collection Time: 03/23/17 10:25 PM  Result Value Ref Range   Glucose-Capillary 204 (H) 65 - 99 mg/dL   Comment 1 Notify RN   CBC     Status: Abnormal   Collection Time: 03/24/17  3:19 AM  Result Value Ref Range   WBC 26.3 (H) 4.0 - 10.5 K/uL    Comment: CONSISTENT WITH PREVIOUS RESULT   RBC 4.32 4.22 - 5.81 MIL/uL   Hemoglobin 11.6 (L) 13.0 - 17.0 g/dL   HCT 34.3 (L) 39.0 - 52.0 %   MCV 79.4 78.0 - 100.0 fL   MCH 26.9 26.0 - 34.0 pg   MCHC 33.8 30.0 - 36.0 g/dL   RDW 18.7 (H) 11.5 - 15.5 %   Platelets 386 150 - 400 K/uL  Basic metabolic panel     Status: Abnormal   Collection Time: 03/24/17  3:19 AM  Result Value Ref Range   Sodium 131 (L) 135 - 145 mmol/L   Potassium 3.4 (L) 3.5 - 5.1 mmol/L   Chloride 95 (L) 101 - 111 mmol/L   CO2 27 22 - 32 mmol/L   Glucose, Bld 220 (H) 65 - 99 mg/dL   BUN 28 (H) 6 - 20 mg/dL   Creatinine, Ser 1.47 (H) 0.61 - 1.24 mg/dL   Calcium 7.6 (L) 8.9 - 10.3 mg/dL   GFR calc non Af Amer 56 (L) >60 mL/min   GFR calc Af Amer >60 >60 mL/min  Comment: (NOTE) The eGFR has been calculated using the CKD EPI equation. This calculation has not been validated in all clinical situations. eGFR's persistently <60 mL/min signify possible Chronic Kidney Disease.    Anion gap 9 5 - 15  Glucose, capillary     Status: Abnormal   Collection Time: 03/24/17  6:17 AM  Result Value Ref Range   Glucose-Capillary 191 (H) 65 - 99 mg/dL  Glucose, capillary     Status: Abnormal   Collection Time: 03/24/17  8:00 AM  Result Value Ref Range   Glucose-Capillary 176 (H) 65 - 99 mg/dL  Glucose, capillary     Status: Abnormal   Collection Time: 03/24/17 11:08 AM   Result Value Ref Range   Glucose-Capillary 262 (H) 65 - 99 mg/dL    MICRO: Polymicrobial on wound cx 8/13  IMAGING:  Assessment/Plan:  46yo M with well controlled HIV disease, IDDM, and CVA admitted for polymicrobial necrotizing fasciitis of perineum  - continue on broad spectrum abtx until cx results return. Agree with repeat eval/I x D pending his return trip to OR tomorrow   hiv disease = well controlled on descovy and tivicay, continue  Leukocytosis = reaction to infection. Anticipate to improve

## 2017-03-24 NOTE — Progress Notes (Signed)
PROGRESS NOTE  Francisco Hutchinson ZTI:[id number] DOB: 1971-06-23 DOA: 03/23/2017 PCP: Marco Collie, MD   LOS: 1 day   Brief Narrative / Interim history: 46 year old male with history of HIV, diabetes mellitus, previous strokes, who was admitted to the hospital on 03/23/2017 with 1-2 week history of left buttock pain, formed an abscess and drained on its own at home.  Given concern for necrotizing fasciitis in the ED, general surgery was consulted.  He was taken to the operating room on 8/13, and underwent incision and drainage and extensive fascial and soft tissue debridement left buttock, perineum and groin.  Urology was consulted during surgery as it felt that the necrotizing fasciitis extending into the perineum and the bulbospongiosus muscle.  Assessment & Plan: Active Problems:   Abscess of buttock, left   Status post incision and drainage   Necrotizing fasciitis [facility])   Renal insufficiency   Perineal necrotizing fasciitis -General surgery consulted, patient underwent operative irrigation and debridement on 8/13. -Urology consulted as well during surgery due to involvement of the perineum and the bulbospongiosus muscle.  He will need cystoscopy and a second look to be considered for 03/25/2017. -For now continue broad-spectrum antibiotics of vancomycin and Zosyn -Culture from surgery shows few gram-negative rods and moderate gram-positive cocci in pairs, final report is pending. -MRSA PCR screen was positive -Of note, patient does have a history of necrotizing fasciitis in the right upper extremity in September 2016  Insulin-dependent diabetes mellitus -Continue home Lantus of 20 units, plus sliding scale. -Hemoglobin A1c is pending, patient tells me that his hemoglobin A1c most recently was 6.9 and his diabetes is pretty well controlled at home. -CBGs here within 170s and 220.  Prior CVAs -Apparently during her previous hospitalization in 2016 for excising fasciitis he had 2  watershed CVAs  ? Chronic kidney disease -Not clear on prior creatinine, however it is mentioned that he has had kidney failure during previous hospitalizations.  He appears to be stage III if current creatinine is at baseline.  Continue to monitor in the next few days to determine better his baseline  Deconditioning -Obtain physical therapy consultation  HIV -Continue home medications, patient reports compliance, there is no CD4 or viral load in the system, will obtain one   DVT prophylaxis: Heparin Code Status: Full code Family Communication: Discussed with mother at bedside Disposition Plan: Remain stepdown  Consultants:   General surgery  Urology  Procedures:   I&D 8/13  Antimicrobials:  Vancomycin 8/13 >>  Zosyn 8/13 >>   Subjective: - no chest pain, shortness of breath, no abdominal pain, nausea or vomiting.  Pain is controlled, overall he feels well.  Objective: Vitals:   03/24/17 0337 03/24/17 0400 03/24/17 0600 03/24/17 0800  BP:  (!) 133/92 116/72 130/85  Pulse:  91 60 (!) 58  Resp:  (!) 28 (!) 26 16  Temp: (!) 97.5 F (36.4 C)   98.8 F (37.1 C)  TempSrc: Oral   Oral  SpO2:  94% (!) 87% 99%  Weight:      Height:        Intake/Output Summary (Last 24 hours) at 03/24/17 1035 Last data filed at 03/24/17 0600  Gross per 24 hour  Intake             3240 ml  Output             1055 ml  Net             2185 ml   Filed  Weights   03/23/17 2020  Weight: 102.7 kg (226 lb 6.6 oz)    Examination:  Vitals:   03/24/17 0337 03/24/17 0400 03/24/17 0600 03/24/17 0800  BP:  (!) 133/92 116/72 130/85  Pulse:  91 60 (!) 58  Resp:  (!) 28 (!) 26 16  Temp: (!) 97.5 F (36.4 C)   98.8 F (37.1 C)  TempSrc: Oral   Oral  SpO2:  94% (!) 87% 99%  Weight:      Height:        Constitutional: NAD Eyes: lids and conjunctivae normal ENMT: Mucous membranes are moist. Neck: normal, supple Respiratory: clear to auscultation bilaterally, no wheezing, no  crackles. Normal respiratory effort. No accessory muscle use.  Cardiovascular: Regular rate and rhythm, no murmurs / rubs / gallops. No LE edema.  Abdomen: no tenderness. Bowel sounds positive.  Musculoskeletal: no clubbing / cyanosis.  Skin: no rashes, lesions, ulcers. No induration Psychiatric: Normal judgment and insight. Alert and oriented x 3. Normal mood.   Data Reviewed: I have independently reviewed following labs and imaging studies   CBC:  Recent Labs Lab 03/23/17 1347 03/24/17 0319  WBC 29.1* 26.3*  NEUTROABS 26.4*  --   HGB 12.4* 11.6*  HCT 36.1* 34.3*  MCV 79.3 79.4  PLT 436* 163   Basic Metabolic Panel:  Recent Labs Lab 03/23/17 1347 03/24/17 0319  NA 133* 131*  K 3.8 3.4*  CL 97* 95*  CO2 28 27  GLUCOSE 121* 220*  BUN 28* 28*  CREATININE 1.45* 1.47*  CALCIUM 8.0* 7.6*   GFR: Estimated Creatinine Clearance: 78.3 mL/min (A) (by C-G formula based on SCr of 1.47 mg/dL (H)). Liver Function Tests:  Recent Labs Lab 03/23/17 1347  AST 22  ALT 20  ALKPHOS 99  BILITOT 0.8  PROT 7.4  ALBUMIN 2.1*   No results for input(s): LIPASE, AMYLASE in the last 168 hours. No results for input(s): AMMONIA in the last 168 hours. Coagulation Profile: No results for input(s): INR, PROTIME in the last 168 hours. Cardiac Enzymes: No results for input(s): CKTOTAL, CKMB, CKMBINDEX, TROPONINI in the last 168 hours. BNP (last 3 results) No results for input(s): PROBNP in the last 8760 hours. HbA1C: No results for input(s): HGBA1C in the last 72 hours. CBG:  Recent Labs Lab 03/23/17 1429 03/23/17 1654 03/23/17 2225 03/24/17 0617 03/24/17 0800  GLUCAP 103* 109* 204* 191* 176*   Lipid Profile: No results for input(s): CHOL, HDL, LDLCALC, TRIG, CHOLHDL, LDLDIRECT in the last 72 hours. Thyroid Function Tests: No results for input(s): TSH, T4TOTAL, FREET4, T3FREE, THYROIDAB in the last 72 hours. Anemia Panel: No results for input(s): VITAMINB12, FOLATE, FERRITIN,  TIBC, IRON, RETICCTPCT in the last 72 hours. Urine analysis: No results found for: COLORURINE, APPEARANCEUR, LABSPEC, PHURINE, GLUCOSEU, HGBUR, BILIRUBINUR, KETONESUR, PROTEINUR, UROBILINOGEN, NITRITE, LEUKOCYTESUR Sepsis Labs: Invalid input(s): PROCALCITONIN, LACTICIDVEN  Recent Results (from the past 240 hour(s))  Aerobic/Anaerobic Culture (surgical/deep wound)     Status: None (Preliminary result)   Collection Time: 03/23/17  3:44 PM  Result Value Ref Range Status   Specimen Description ABSCESS LEFT BUTTOCKS  Final   Special Requests NONE  Final   Gram Stain   Final    ABUNDANT WBC PRESENT, PREDOMINANTLY PMN FEW GRAM NEGATIVE RODS MODERATE GRAM POSITIVE COCCI IN PAIRS Performed at [facility], [address], [place], [place] [address]    Culture PENDING  Incomplete   Report Status PENDING  Incomplete  MRSA PCR Screening  Status: Abnormal   Collection Time: 03/23/17  9:00 PM  Result Value Ref Range Status   MRSA by PCR POSITIVE (A) NEGATIVE Final    Comment:        The GeneXpert MRSA Assay (FDA approved for NASAL specimens only), is one component of a comprehensive MRSA colonization surveillance program. It is not intended to diagnose MRSA infection nor to guide or monitor treatment for MRSA infections. RESULT CALLED TO, READ BACK BY AND VERIFIED WITH: KOTIAN,S RN 8.13.18 @2311  ZANDO,C       Radiology Studies: No results found.   Scheduled Meds: . amLODipine  5 mg Oral Daily  . Chlorhexidine Gluconate Cloth  6 each Topical Q0600  . dolutegravir  50 mg Oral Daily  . emtricitabine-tenofovir AF  1 tablet Oral Daily  . fenofibrate  160 mg Oral Daily  . heparin  5,000 Units Subcutaneous Q8H  . hydrochlorothiazide  25 mg Oral Daily  . insulin aspart  0-15 Units Subcutaneous TID WC  . insulin aspart  0-5 Units Subcutaneous QHS  . insulin glargine  20 Units Subcutaneous QHS  . mouth rinse  15 mL Mouth Rinse BID  . metoprolol tartrate  100 mg Oral Daily  .  mupirocin ointment  1 application Nasal BID  . sertraline  50 mg Oral Daily   Continuous Infusions: . sodium chloride 100 mL/hr at 03/23/17 1900  . piperacillin-tazobactam (ZOSYN)  IV Stopped (03/24/17 0820)  . vancomycin Stopped (03/24/17 2518)     Marzetta Board, MD, PhD [facility] Pager [phone]  If 7PM-7AM, please contact night-coverage [email or web address] Password [facility] 03/24/2017, 10:35 AM

## 2017-03-25 ENCOUNTER — Encounter (HOSPITAL_COMMUNITY): Payer: Self-pay | Admitting: Anesthesiology

## 2017-03-25 ENCOUNTER — Encounter (HOSPITAL_COMMUNITY): Admission: EM | Disposition: A | Discharge status: Skilled Nursing Facility | Payer: Self-pay | Source: Home / Self Care

## 2017-03-25 ENCOUNTER — Inpatient Hospital Stay (HOSPITAL_COMMUNITY): Payer: BLUE CROSS/BLUE SHIELD | Admitting: Anesthesiology

## 2017-03-25 DIAGNOSIS — M726 Necrotizing fasciitis: Secondary | ICD-10-CM

## 2017-03-25 DIAGNOSIS — L0231 Cutaneous abscess of buttock: Principal | ICD-10-CM

## 2017-03-25 DIAGNOSIS — L8990 Pressure ulcer of unspecified site, unspecified stage: Secondary | ICD-10-CM | POA: Insufficient documentation

## 2017-03-25 DIAGNOSIS — Z9889 Other specified postprocedural states: Secondary | ICD-10-CM

## 2017-03-25 DIAGNOSIS — N183 Chronic kidney disease, stage 3 unspecified: Secondary | ICD-10-CM

## 2017-03-25 DIAGNOSIS — E1122 Type 2 diabetes mellitus with diabetic chronic kidney disease: Secondary | ICD-10-CM

## 2017-03-25 DIAGNOSIS — E1322 Other specified diabetes mellitus with diabetic chronic kidney disease: Secondary | ICD-10-CM

## 2017-03-25 HISTORY — PX: IRRIGATION AND DEBRIDEMENT BUTTOCKS: SHX6601

## 2017-03-25 LAB — COMPREHENSIVE METABOLIC PANEL
ALBUMIN: 1.8 g/dL — AB (ref 3.5–5.0)
ALT: 14 U/L — ABNORMAL LOW (ref 17–63)
AST: 18 U/L (ref 15–41)
Alkaline Phosphatase: 67 U/L (ref 38–126)
Anion gap: 6 (ref 5–15)
BUN: 31 mg/dL — ABNORMAL HIGH (ref 6–20)
CALCIUM: 7.3 mg/dL — AB (ref 8.9–10.3)
CHLORIDE: 100 mmol/L — AB (ref 101–111)
CO2: 27 mmol/L (ref 22–32)
Creatinine, Ser: 1.54 mg/dL — ABNORMAL HIGH (ref 0.61–1.24)
GFR calc Af Amer: >60 mL/min (ref >60)
GFR, EST NON AFRICAN AMERICAN: 53 mL/min — AB (ref >60)
Glucose, Bld: 195 mg/dL — ABNORMAL HIGH (ref 65–99)
POTASSIUM: 3.7 mmol/L (ref 3.5–5.1)
SODIUM: 133 mmol/L — AB (ref 135–145)
TOTAL PROTEIN: 6.2 g/dL — AB (ref 6.5–8.1)
Total Bilirubin: 0.6 mg/dL (ref 0.3–1.2)

## 2017-03-25 LAB — GLUCOSE, CAPILLARY
GLUCOSE-CAPILLARY: 145 mg/dL — AB (ref 65–99)
GLUCOSE-CAPILLARY: 95 mg/dL (ref 65–99)
GLUCOSE-CAPILLARY: 157 mg/dL — AB (ref 65–99)
Glucose-Capillary: 101 mg/dL — ABNORMAL HIGH (ref 65–99)
Glucose-Capillary: 91 mg/dL (ref 65–99)

## 2017-03-25 LAB — CBC
HCT: 30.6 % — ABNORMAL LOW (ref 39.0–52.0)
HEMOGLOBIN: 10.4 g/dL — AB (ref 13.0–17.0)
MCH: 26.9 pg (ref 26.0–34.0)
MCHC: 34 g/dL (ref 30.0–36.0)
MCV: 79.1 fL (ref 78.0–100.0)
Platelets: 387 10*3/uL (ref 150–400)
RBC: 3.87 MIL/uL — AB (ref 4.22–5.81)
RDW: 19.4 % — ABNORMAL HIGH (ref 11.5–15.5)
WBC: 15.3 10*3/uL — ABNORMAL HIGH (ref 4.0–10.5)

## 2017-03-25 LAB — HEMOGLOBIN A1C
HEMOGLOBIN A1C: 10.9 % — AB (ref 4.8–5.6)
Mean Plasma Glucose: 266 mg/dL

## 2017-03-25 SURGERY — IRRIGATION AND DEBRIDEMENT BUTTOCKS
Anesthesia: General

## 2017-03-25 MED ORDER — HYDROMORPHONE HCL-NACL 0.5-0.9 MG/ML-% IV SOSY: 0.2500 mg | PREFILLED_SYRINGE | INTRAVENOUS | Status: DC | PRN

## 2017-03-25 MED ORDER — SUCCINYLCHOLINE CHLORIDE 200 MG/10ML IV SOSY
PREFILLED_SYRINGE | INTRAVENOUS | Status: DC | PRN
  Administered 2017-03-25: 100 mg via INTRAVENOUS

## 2017-03-25 MED ORDER — MIDAZOLAM HCL 2 MG/2ML IJ SOLN
INTRAMUSCULAR | Status: AC
  Filled 2017-03-25: qty 2

## 2017-03-25 MED ORDER — FENTANYL CITRATE (PF) 100 MCG/2ML IJ SOLN
INTRAMUSCULAR | Status: AC
  Filled 2017-03-25: qty 2

## 2017-03-25 MED ORDER — MIDAZOLAM HCL 5 MG/5ML IJ SOLN
INTRAMUSCULAR | Status: DC | PRN
  Administered 2017-03-25: 2 mg via INTRAVENOUS

## 2017-03-25 MED ORDER — ONDANSETRON HCL 4 MG/2ML IJ SOLN
INTRAMUSCULAR | Status: AC
  Filled 2017-03-25: qty 12

## 2017-03-25 MED ORDER — LIDOCAINE 2% (20 MG/ML) 5 ML SYRINGE
INTRAMUSCULAR | Status: AC
  Filled 2017-03-25: qty 5

## 2017-03-25 MED ORDER — LACTATED RINGERS IV SOLN: INTRAVENOUS | Status: DC | PRN

## 2017-03-25 MED ORDER — ROCURONIUM BROMIDE 50 MG/5ML IV SOSY
PREFILLED_SYRINGE | INTRAVENOUS | Status: AC
  Filled 2017-03-25: qty 5

## 2017-03-25 MED ORDER — HYDROMORPHONE HCL-NACL 0.5-0.9 MG/ML-% IV SOSY
PREFILLED_SYRINGE | INTRAVENOUS | Status: AC
  Filled 2017-03-25: qty 2

## 2017-03-25 MED ORDER — SODIUM CHLORIDE 0.9 % IR SOLN
Status: DC | PRN
  Administered 2017-03-25: 3000 mL

## 2017-03-25 MED ORDER — LIDOCAINE 2% (20 MG/ML) 5 ML SYRINGE
INTRAMUSCULAR | Status: DC | PRN
  Administered 2017-03-25: 100 mg via INTRAVENOUS

## 2017-03-25 MED ORDER — 0.9 % SODIUM CHLORIDE (POUR BTL) OPTIME
TOPICAL | Status: DC | PRN
  Administered 2017-03-25: 1000 mL

## 2017-03-25 MED ORDER — FENTANYL CITRATE (PF) 100 MCG/2ML IJ SOLN
INTRAMUSCULAR | Status: DC | PRN
  Administered 2017-03-25 (×4): 50 ug via INTRAVENOUS

## 2017-03-25 MED ORDER — PROMETHAZINE HCL 25 MG/ML IJ SOLN: 6.2500 mg | INTRAMUSCULAR | Status: DC | PRN

## 2017-03-25 MED ORDER — DEXAMETHASONE SODIUM PHOSPHATE 10 MG/ML IJ SOLN
INTRAMUSCULAR | Status: AC
  Filled 2017-03-25: qty 5

## 2017-03-25 MED ORDER — PROPOFOL 10 MG/ML IV BOLUS
INTRAVENOUS | Status: DC | PRN
  Administered 2017-03-25: 200 mg via INTRAVENOUS

## 2017-03-25 MED ORDER — PROPOFOL 10 MG/ML IV BOLUS
INTRAVENOUS | Status: AC
  Filled 2017-03-25: qty 20

## 2017-03-25 MED ORDER — PIPERACILLIN-TAZOBACTAM 3.375 G IVPB
INTRAVENOUS | Status: AC
  Filled 2017-03-25: qty 50

## 2017-03-25 SURGICAL SUPPLY — 25 items
BLADE CLIPPER SURG (BLADE) IMPLANT
BLADE SURG 15 STRL LF DISP TIS (BLADE) ×1 IMPLANT
BLADE SURG 15 STRL SS (BLADE) ×2
BNDG CONFORM 6X.82 1P STRL (GAUZE/BANDAGES/DRESSINGS) ×3 IMPLANT
CHLORAPREP W/TINT 26ML (MISCELLANEOUS) IMPLANT
COVER SURGICAL LIGHT HANDLE (MISCELLANEOUS) ×3 IMPLANT
DRAPE LAPAROSCOPIC ABDOMINAL (DRAPES) ×3 IMPLANT
ELECT PENCIL ROCKER SW 15FT (MISCELLANEOUS) ×3 IMPLANT
ELECT REM PT RETURN 15FT ADLT (MISCELLANEOUS) ×3 IMPLANT
GAUZE SPONGE 4X4 12PLY STRL (GAUZE/BANDAGES/DRESSINGS) ×3 IMPLANT
GLOVE BIOGEL PI IND STRL 8 (GLOVE) ×1 IMPLANT
GLOVE BIOGEL PI INDICATOR 8 (GLOVE) ×2
GLOVE SURG SS PI 7.5 STRL IVOR (GLOVE) ×3 IMPLANT
GOWN STRL REUS W/TWL LRG LVL3 (GOWN DISPOSABLE) ×3 IMPLANT
GOWN STRL REUS W/TWL XL LVL3 (GOWN DISPOSABLE) ×3 IMPLANT
KIT BASIN OR (CUSTOM PROCEDURE TRAY) ×3 IMPLANT
NS IRRIG 1000ML POUR BTL (IV SOLUTION) ×3 IMPLANT
PACK CYSTO (CUSTOM PROCEDURE TRAY) ×3 IMPLANT
PACK GENERAL/GYN (CUSTOM PROCEDURE TRAY) ×3 IMPLANT
PAD ABD 8X10 STRL (GAUZE/BANDAGES/DRESSINGS) ×3 IMPLANT
SPONGE LAP 18X18 X RAY DECT (DISPOSABLE) ×3 IMPLANT
SWAB CULTURE ESWAB REG 1ML (MISCELLANEOUS) IMPLANT
SYR BULB IRRIGATION 50ML (SYRINGE) ×3 IMPLANT
TOWEL OR 17X26 10 PK STRL BLUE (TOWEL DISPOSABLE) ×3 IMPLANT
YANKAUER SUCT BULB TIP NO VENT (SUCTIONS) ×3 IMPLANT

## 2017-03-25 NOTE — Progress Notes (Signed)
Patient ID: Francisco Hutchinson, male   DOB: 06-24-1971, 46 y.o.   MRN: [id number] 2 Days Post-Op   Subjective: No complaints this morning. Comfortable.  Objective: Vital signs in last 24 hours: Temp:  [98.8 F (37.1 C)-99.3 F (37.4 C)] 98.9 F (37.2 C) (08/15 0309) Pulse Rate:  [58-76] 62 (08/15 0400) Resp:  [16-32] 26 (08/15 0400) BP: (109-140)/(70-86) 140/86 (08/15 0400) SpO2:  [93 %-99 %] 97 % (08/15 0400)    Intake/Output from previous day: 08/14 0701 - 08/15 0700 In: 3750 [P.O.:600; I.V.:2600; IV Piggyback:550] Out: 0092 [Urine:1450] Intake/Output this shift: No intake/output data recorded.  Alert and responsive  Lab Results:   Recent Labs  03/24/17 0319 03/25/17 0304  WBC 26.3* 15.3*  HGB 11.6* 10.4*  HCT 34.3* 30.6*  PLT 386 387   BMET  Recent Labs  03/24/17 0319 03/25/17 0304  NA 131* 133*  K 3.4* 3.7  CL 95* 100*  CO2 27 27  GLUCOSE 220* 195*  BUN 28* 31*  CREATININE 1.47* 1.54*  CALCIUM 7.6* 7.3*     Studies/Results: No results found.  Anti-infectives: Anti-infectives    Start     Dose/Rate Route Frequency Ordered Stop   03/24/17 1000  emtricitabine-tenofovir AF (DESCOVY) 200-25 MG per tablet 1 tablet     1 tablet Oral Daily 03/23/17 2020     03/24/17 0200  vancomycin (VANCOCIN) IVPB 1000 mg/200 mL premix     1,000 mg 200 mL/hr over 60 Minutes Intravenous Every 12 hours 03/23/17 2110     03/23/17 2100  dolutegravir (TIVICAY) tablet 50 mg     50 mg Oral Daily 03/23/17 2020     03/23/17 2030  piperacillin-tazobactam (ZOSYN) IVPB 3.375 g     3.375 g 12.5 mL/hr over 240 Minutes Intravenous Every 8 hours 03/23/17 2027     03/23/17 1428  vancomycin (VANCOCIN) 1-5 GM/200ML-% IVPB    Comments:  Armistead, Lacey   : cabinet override      03/23/17 1428 03/23/17 1518   03/23/17 1215  piperacillin-tazobactam (ZOSYN) IVPB 3.375 g     3.375 g 100 mL/hr over 30 Minutes Intravenous  Once 03/23/17 1211 03/23/17 1353   03/23/17 1215  vancomycin  (VANCOCIN) IVPB 1000 mg/200 mL premix     1,000 mg 200 mL/hr over 60 Minutes Intravenous  Once 03/23/17 1211 03/23/17 1518      Assessment/Plan: Necrotizing soft tissue infection left buttock and perineum status post incision and drainage and debridement. Afebrile and white blood count decreasing. Plan dressing change and examination under anesthesia in the operating room today in conjunction with urology. Discussed with the patient and he agrees and has no questions.    LOS: 2 days    Nansi Birmingham T 03/25/2017

## 2017-03-25 NOTE — Progress Notes (Signed)
[facility] CONSULT PROGRESS NOTE    Progress Note  Francisco Hutchinson  ZDG:[id number] DOB: January 16, 1971 DOA: 03/23/2017 PCP: Marco Collie, MD     Brief Narrative:   Francisco Hutchinson is an 45 y.o. male past medical history of diabetes mellitus, HIV previous stroke admitted on 12/21/2016 for left buttock pain and an abscess which was draining at home. General surgery and urology were consulted and was taken to the OR and underwent incision and debridement with extensive facia and soft tissue debridement.  Assessment/Plan:   Abscess of buttock, left/ Perineal Necrotizing fasciitis [facility]): General surgery and urology were consulted as there was involvement of the perineum and the bulbospongiosus muscle. Second look to be considerably 15 2018. ID was consulted who recommended to continue IV vancomycin and Zosyn. Cultures from the surgery grew gram-negative rods and moderate gram-positive cocci  Diabetes mellitus type 2 with chronic renal disease: Continue Lantus plus sliding scale  . She was improving now to ranging from 195-145. He is currently nothing by mouth A1c was 10.9 on admission.  History of CVA's: With 2 watershed CVAs.  Chronic renal disease stage III: Currently stage III need to seems to be his baseline.  Deconditioning: Physical therapy evaluation is pending.  Patient: Continue current meds.   Renal insufficiency    DVT prophylaxis: Lovenox Family Communication:none Disposition Plan/Barrier to D/C: per surgery Code Status:     Code Status Orders        Start     Ordered   03/23/17 2021  Full code  Continuous     03/23/17 2020    Code Status History    Date Active Date Inactive Code Status Order ID Comments User Context   This patient has a current code status but no historical code status.        IV Access:    Peripheral IV   Procedures and diagnostic studies:   No results found.   Medical Consultants:     None.  Anti-Infectives:   IV vancomycin and Zosyn.   Subjective:    [name] he relates his pain is controlled on a complains.  Objective:    Vitals:   03/25/17 0030 03/25/17 0200 03/25/17 0309 03/25/17 0400  BP: 127/73 139/78  140/86  Pulse: 65 66  62  Resp: (!) 26 20  (!) 26  Temp:   98.9 F (37.2 C)   TempSrc:   Oral   SpO2: 97% 97%  97%  Weight:      Height:        Intake/Output Summary (Last 24 hours) at 03/25/17 0721 Last data filed at 03/25/17 0600  Gross per 24 hour  Intake             3750 ml  Output             1450 ml  Net             2300 ml   Filed Weights   03/23/17 2020  Weight: 102.7 kg (226 lb 6.6 oz)    Exam: General exam:In no acute distress Respiratory system: Good air movement and clear to auscultation. Cardiovascular system: Regular rate and rhythm, positive S1-S2. Gastrointestinal system: Abdomen is soft nontender nondistended Central nervous system: Alert and oriented. No focal neurological deficits. Extremities: No lower extremity edema. Skin: Gauzes in the perineum and buttock area. Psychiatry: Judgement and insight appear normal. Mood & affect appropriate.    Data Reviewed:    Labs: Basic Metabolic Panel:  Recent Labs Lab 03/23/17 1347 03/24/17 0319 03/25/17 0304  NA 133* 131* 133*  K 3.8 3.4* 3.7  CL 97* 95* 100*  CO2 28 27 27   GLUCOSE 121* 220* 195*  BUN 28* 28* 31*  CREATININE 1.45* 1.47* 1.54*  CALCIUM 8.0* 7.6* 7.3*   GFR Estimated Creatinine Clearance: 74.7 mL/min (A) (by C-G formula based on SCr of 1.54 mg/dL (H)). Liver Function Tests:  Recent Labs Lab 03/23/17 1347 03/25/17 0304  AST 22 18  ALT 20 14*  ALKPHOS 99 67  BILITOT 0.8 0.6  PROT 7.4 6.2*  ALBUMIN 2.1* 1.8*   No results for input(s): LIPASE, AMYLASE in the last 168 hours. No results for input(s): AMMONIA in the last 168 hours. Coagulation profile No results for input(s): INR, PROTIME in the last 168  hours.  CBC:  Recent Labs Lab 03/23/17 1347 03/24/17 0319 03/25/17 0304  WBC 29.1* 26.3* 15.3*  NEUTROABS 26.4*  --   --   HGB 12.4* 11.6* 10.4*  HCT 36.1* 34.3* 30.6*  MCV 79.3 79.4 79.1  PLT 436* 386 387   Cardiac Enzymes: No results for input(s): CKTOTAL, CKMB, CKMBINDEX, TROPONINI in the last 168 hours. BNP (last 3 results) No results for input(s): PROBNP in the last 8760 hours. CBG:  Recent Labs Lab 03/24/17 0617 03/24/17 0800 03/24/17 1108 03/24/17 1524 03/24/17 2128  GLUCAP 191* 176* 262* 321* 270*   D-Dimer: No results for input(s): DDIMER in the last 72 hours. Hgb A1c:  Recent Labs  03/23/17 2052  HGBA1C 10.9*   Lipid Profile: No results for input(s): CHOL, HDL, LDLCALC, TRIG, CHOLHDL, LDLDIRECT in the last 72 hours. Thyroid function studies: No results for input(s): TSH, T4TOTAL, T3FREE, THYROIDAB in the last 72 hours.  Invalid input(s): FREET3 Anemia work up: No results for input(s): VITAMINB12, FOLATE, FERRITIN, TIBC, IRON, RETICCTPCT in the last 72 hours. Sepsis Labs:  Recent Labs Lab 03/23/17 1343 03/23/17 1347 03/24/17 0319 03/25/17 0304  WBC  --  29.1* 26.3* 15.3*  LATICACIDVEN 1.38  --   --   --    Microbiology Recent Results (from the past 240 hour(s))  Aerobic/Anaerobic Culture (surgical/deep wound)     Status: None (Preliminary result)   Collection Time: 03/23/17  3:44 PM  Result Value Ref Range Status   Specimen Description ABSCESS LEFT BUTTOCKS  Final   Special Requests NONE  Final   Gram Stain   Final    ABUNDANT WBC PRESENT, PREDOMINANTLY PMN FEW GRAM NEGATIVE RODS MODERATE GRAM POSITIVE COCCI IN PAIRS    Culture   Final    CULTURE REINCUBATED FOR BETTER GROWTH Performed at [facility], [place] [address] [place], [place] [address]    Report Status PENDING  Incomplete  MRSA PCR Screening     Status: Abnormal   Collection Time: 03/23/17  9:00 PM  Result Value Ref Range Status   MRSA by PCR POSITIVE (A) NEGATIVE  Final    Comment:        The GeneXpert MRSA Assay (FDA approved for NASAL specimens only), is one component of a comprehensive MRSA colonization surveillance program. It is not intended to diagnose MRSA infection nor to guide or monitor treatment for MRSA infections. RESULT CALLED TO, READ BACK BY AND VERIFIED WITH: KOTIAN,S RN 8.13.18 @2311  ZANDO,C      Medications:   . amLODipine  5 mg Oral Daily  . Chlorhexidine Gluconate Cloth  6 each Topical Q0600  . dolutegravir  50 mg Oral Daily  . emtricitabine-tenofovir  AF  1 tablet Oral Daily  . fenofibrate  160 mg Oral Daily  . heparin  5,000 Units Subcutaneous Q8H  . hydrochlorothiazide  25 mg Oral Daily  . insulin aspart  0-15 Units Subcutaneous TID WC  . insulin aspart  0-5 Units Subcutaneous QHS  . insulin glargine  20 Units Subcutaneous QHS  . mouth rinse  15 mL Mouth Rinse BID  . metoprolol tartrate  100 mg Oral Daily  . mupirocin ointment  1 application Nasal BID  . sertraline  50 mg Oral Daily   Continuous Infusions: . sodium chloride 100 mL/hr at 03/25/17 0529  . piperacillin-tazobactam (ZOSYN)  IV 3.375 g (03/25/17 0434)  . vancomycin Stopped (03/25/17 0250)     LOS: 2 days   Charlynne Cousins  [facility] Pager [phone]  *Please refer to [email or web address], password [email or web address] to get updated schedule on who will round on this patient, as hospitalists switch teams weekly. If 7PM-7AM, please contact night-coverage at [email or web address], password [email or web address] for any overnight needs.  03/25/2017, 7:21 AM

## 2017-03-25 NOTE — Evaluation (Signed)
Physical Therapy Evaluation Patient Details Name: Francisco Hutchinson MRN: [id number] DOB: [birth year] Today's Date: 03/25/2017   History of Present Illness  Pt admitted for I&d of L buttock abscess 2* necrotizing facitis.  Pt with hx of CVA, HIV, DM and necrotizing facitis R UE  Clinical Impression  Pt admitted as above and presenting with functional mobility limitations 2* generalized weakness, balance deficits and ltd endurance.  Pt hopes to progress to dc home with assist of friend but dependent on acute stay progress may need to consider ST SNF level rehab.    Follow Up Recommendations Home health PT;SNF (Dependent on acute progress and home assist level)    Equipment Recommendations  Other (comment) (TBD)    Recommendations for Other Services OT consult     Precautions / Restrictions Precautions Precautions: Fall Restrictions Weight Bearing Restrictions: No      Mobility  Bed Mobility               General bed mobility comments: At point of attempting OOB, pt states, I just cant do this, I am just too tired.  Transfers                    Ambulation/Gait                Stairs            Wheelchair Mobility    Modified Rankin (Stroke Patients Only)       Balance                                             Pertinent Vitals/Pain Pain Assessment: No/denies pain    Home Living Family/patient expects to be discharged to:: Private residence Living Arrangements: Non-relatives/Friends ("My friend Claiborne Hutchinson") Available Help at Discharge: Friend(s) Type of Home: House Home Access: Level entry     Home Layout: One level Home Equipment: Environmental consultant - 2 wheels;Wheelchair - manual      Prior Function Level of Independence: Needs assistance   Gait / Transfers Assistance Needed: Pt states he ambulated with RW but informed RN that he was largely wc bound  ADL's / Homemaking Assistance Needed: assist of friend        Hand  Dominance        Extremity/Trunk Assessment   Upper Extremity Assessment Upper Extremity Assessment: Generalized weakness (R UE weaker)    Lower Extremity Assessment Lower Extremity Assessment: Generalized weakness;RLE deficits/detail;LLE deficits/detail RLE Deficits / Details: drop foot  LLE Deficits / Details: drop foot       Communication   Communication: No difficulties  Cognition Arousal/Alertness: Lethargic;Suspect due to medications Behavior During Therapy: [facility] for tasks assessed/performed;Flat affect Overall Cognitive Status: Within Functional Limits for tasks assessed                                 General Comments: Pt agreeable to PT but limited session 2* fatigue      General Comments      Exercises     Assessment/Plan    PT Assessment Patient needs continued PT services  PT Problem List Decreased strength;Decreased range of motion;Decreased activity tolerance;Decreased mobility;Decreased knowledge of use of DME       PT Treatment Interventions DME instruction;Gait training;Functional mobility training;Therapeutic activities;Therapeutic exercise;Patient/family education    PT Goals (  Current goals can be found in the Care Plan section)  Acute Rehab PT Goals Patient Stated Goal: No specific goals expressed PT Goal Formulation: Patient unable to participate in goal setting Time For Goal Achievement: 04/08/17 Potential to Achieve Goals: Fair    Frequency Min 3X/week   Barriers to discharge        Co-evaluation               AM-PAC PT "6 Clicks" Daily Activity  Outcome Measure Difficulty turning over in bed (including adjusting bedclothes, sheets and blankets)?: Total Difficulty moving from lying on back to sitting on the side of the bed? : Total Difficulty sitting down on and standing up from a chair with arms (e.g., wheelchair, bedside commode, etc,.)?: Total Help needed moving to and from a bed to chair (including a  wheelchair)?: Total Help needed walking in hospital room?: Total Help needed climbing 3-5 steps with a railing? : Total 6 Click Score: 6    End of Session   Activity Tolerance: Patient limited by lethargy;Patient limited by fatigue Patient left: in bed;with call bell/phone within reach Nurse Communication: Mobility status PT Visit Diagnosis: Muscle weakness (generalized) (M62.81);Difficulty in walking, not elsewhere classified (R26.2)    Time: 8466-5993 PT Time Calculation (min) (ACUTE ONLY): 16 min   Charges:   PT Evaluation $PT Eval Moderate Complexity: 1 Mod     PT G Codes:        Pg [phone]   Francisco Hutchinson 03/25/2017, 12:22 PM

## 2017-03-25 NOTE — Transfer of Care (Signed)
Immediate Anesthesia Transfer of Care Note  Patient: Francisco Hutchinson  Procedure(s) Performed: Procedure(s): IRRIGATION AND DEBRIDEMENT DRESSING CHANGE AND EXPLORATION OF PERINEAL WOUND; CYSTOSCOPY (N/A)  Patient Location: PACU  Anesthesia Type:General  Level of Consciousness: awake, alert , oriented and sedated  Airway & Oxygen Therapy: Patient Spontanous Breathing  Post-op Assessment: Report given to RN  Post vital signs: Reviewed and stable  Last Vitals:  Vitals:   03/25/17 1000 03/25/17 1425  BP: 138/88 (!) 140/96  Pulse: 61 66  Resp: (!) 24 18  Temp:  37 C  SpO2: 99% 100%    Last Pain:  Vitals:   03/25/17 0800  TempSrc: Oral  PainSc:       Patients Stated Pain Goal: 3 (00/34/91 7915)  Complications: No apparent anesthesia complications

## 2017-03-25 NOTE — Anesthesia Preprocedure Evaluation (Addendum)
Anesthesia Evaluation  Patient identified by MRN, date of birth, ID band Patient awake    Reviewed: Allergy & Precautions, NPO status , Patient's Chart, lab work & pertinent test results  Airway Mallampati: II       Dental   Pulmonary shortness of breath,    breath sounds clear to auscultation       Cardiovascular hypertension,  Rhythm:Regular Rate:Normal     Neuro/Psych CVA    GI/Hepatic   Endo/Other  diabetes, Poorly ControlledNecrotizing fasciitsi  Renal/GU Renal InsufficiencyRenal disease     Musculoskeletal   Abdominal   Peds  Hematology  (+) Blood dyscrasia, , HIV   Anesthesia Other Findings   Reproductive/Obstetrics                           Anesthesia Physical Anesthesia Plan  ASA: III  Anesthesia Plan: General   Post-op Pain Management:    Induction: Intravenous  PONV Risk Score and Plan:   Airway Management Planned: Oral ETT  Additional Equipment:   Intra-op Plan:   Post-operative Plan: Extubation in OR  Informed Consent: I have reviewed the patients History and Physical, chart, labs and discussed the procedure including the risks, benefits and alternatives for the proposed anesthesia with the patient or authorized representative who has indicated his/her understanding and acceptance.     Plan Discussed with: CRNA  Anesthesia Plan Comments:         Anesthesia Quick Evaluation

## 2017-03-25 NOTE — Op Note (Signed)
Preoperative diagnosis: Fournier's gangrene Postoperative diagnosis: Fournier's gangrene Surgery: Debridement of Fournier's gangrene and cystoscopy and insertion of Foley catheter Surgeon: Dr. Nicki Reaper. Francisco Hutchinson  The patient is above diagnoses and consented to the above procedure.  We are asked to help out with the general surgery team.  After taking down the dressing a remove the Foley catheter after inspected that there was no obvious hole in the urethra.  I then cystoscoped the patient  Of the emptying [place] scope the penile bulbar and membranous and part of the prostatic urethra was normal.  There is no injury or stricture.  The tissue was very rigid and did not want to cause injury with friable tissue in the perineum so I stopped endoscopy at this stage  16 French Foley catheter was easily placed for clear urine  Visually and palpably the corporal bodies and urethra were intact.  The bulbospongiosus muscle was recently healthy.  There was filmy necrotic tissue yellow in color beside and along some of the corporal bodies distal to the bulbospongiosus muscle.  This was excised using scissors.  There was no injury.  A few smaller pockets were opened and debrided.  Gen. Surgery extensively evaluated all other areas.  They applied irrigation and the pack.  The patient will be followed as per protocol

## 2017-03-25 NOTE — Op Note (Signed)
Preoperative Diagnosis: abscess left buttock abscess Forniers Gangrene  Postoprative Diagnosis: abscess left buttock abscess Forniers Gangrene  Procedure: Procedure(s): IRRIGATION AND DEBRIDEMENT DRESSING CHANGE AND EXPLORATION OF PERINEAL WOUND   Surgeon: Excell Seltzer T   Assistants: None  Anesthesia:  General endotracheal anesthesia  Indications: patient is 2 days status post extensive excision and drainage and debridement for a necrotizing soft tissue infection involving the left but occupied perirectal space extending into the perineum and left groin. He is return to the operating room today for planned dressing change and wound exploration in conjunction with urology as the process extended around the urethra with some necrotic tissue in that area. I discussed the procedure with the patient and his mother in detail and they understand and agree.    Procedure Detail:  Patient was brought to the operating room, placed in supine position on the operating table, and general endotracheal anesthesia induced. He was carefully positioned in lithotomy position. Dressing removed. Foley catheter was removed. The entire perineum and genitalia and lower abdomen were widely sterilely prepped and draped. He was already on broad-spectrum IV antibiotics. Patient timeout was performed and correct procedures verified. Urology then proceeded with cystoscopy and then Foley catheter was replaced. The wound was thoroughly exposed and examined. There was some superficial necrotic tissue in the more anterior aspect of the wound on either side of the urethra and base of scrotum. This was debrided by the urology team. At the completion of that debridement more fully examine the wound and the posterior aspect of the wound and perirectal space and groin all appeared to have healthy tissue with minimal if any necrotic tissue remaining and no unusual drainage.The wound was thoroughly irrigated with saline. The  wound was extensively packed with moist Kerlix gauze and covered with ABDs and mesh panties placed. He tolerated the procedure well. Sponge and needle counts were correct.    Findings: As above  Estimated Blood Loss:  Minimal         Drains: wound packed open with moist Kerlix  Blood Given: none          Specimens: none        Complications:  * No complications entered in OR log *         Disposition: PACU - hemodynamically stable.         Condition: stable

## 2017-03-25 NOTE — Anesthesia Procedure Notes (Signed)
Procedure Name: Intubation Date/Time: 03/25/2017 1:21 PM Performed by: Daemon Dowty, Virgel Gess Pre-anesthesia Checklist: Patient identified, Emergency Drugs available, Suction available and Patient being monitored Patient Re-evaluated:Patient Re-evaluated prior to induction Oxygen Delivery Method: Circle system utilized Preoxygenation: Pre-oxygenation with 100% oxygen Induction Type: IV induction Ventilation: Two handed mask ventilation required Laryngoscope Size: Miller and 2 Grade View: Grade I Tube type: Oral Tube size: 7.5 mm Number of attempts: 1 Airway Equipment and Method: Stylet Dental Injury: Teeth and Oropharynx as per pre-operative assessment

## 2017-03-25 NOTE — Progress Notes (Signed)
Inpatient Diabetes Program Recommendations  AACE/ADA: New Consensus Statement on Inpatient Glycemic Control (2015)  Target Ranges:  Prepandial:   less than 140 mg/dL      Peak postprandial:   less than 180 mg/dL (1-2 hours)      Critically ill patients:  140 - 180 mg/dL   Lab Results  Component Value Date   GLUCAP 91 03/25/2017   HGBA1C 10.9 (H) 03/23/2017    Review of Glycemic Control  Diabetes history: DM2 Outpatient Diabetes medications: Lantus 30 units QHS, Trulicity 1.5 mg Q S Current orders for Inpatient glycemic control: Lantus 20 units QHS, Novolog 0-15 units tidwc and hs  HgbA1C - 10.9%. Blood sugars trending well at present.  Inpatient Diabetes Program Recommendations:     Will likely need meal coverage insulin - Novolog 4 units tidwc. Encourage pt to f/u with PCP for needed insulin adjustments with HgbA1C of 10.9%. Will need to check blood sugars at home and take logbook to MD for any needed adjustments.  Continue to follow.  Thank you. Lorenda Peck, RD, LDN, CDE Inpatient Diabetes Coordinator [phone]

## 2017-03-26 ENCOUNTER — Encounter (HOSPITAL_COMMUNITY): Payer: Self-pay | Admitting: General Surgery

## 2017-03-26 LAB — BASIC METABOLIC PANEL
Anion gap: 10 (ref 5–15)
BUN: 25 mg/dL — AB (ref 6–20)
CO2: 23 mmol/L (ref 22–32)
CREATININE: 1.45 mg/dL — AB (ref 0.61–1.24)
Calcium: 7.5 mg/dL — ABNORMAL LOW (ref 8.9–10.3)
Chloride: 100 mmol/L — ABNORMAL LOW (ref 101–111)
GFR calc Af Amer: >60 mL/min (ref >60)
GFR calc non Af Amer: 57 mL/min — ABNORMAL LOW (ref >60)
GLUCOSE: 194 mg/dL — AB (ref 65–99)
Potassium: 3.9 mmol/L (ref 3.5–5.1)
SODIUM: 133 mmol/L — AB (ref 135–145)

## 2017-03-26 LAB — GLUCOSE, CAPILLARY
GLUCOSE-CAPILLARY: 137 mg/dL — AB (ref 65–99)
GLUCOSE-CAPILLARY: 158 mg/dL — AB (ref 65–99)
Glucose-Capillary: 146 mg/dL — ABNORMAL HIGH (ref 65–99)
Glucose-Capillary: 220 mg/dL — ABNORMAL HIGH (ref 65–99)

## 2017-03-26 LAB — VANCOMYCIN, TROUGH: VANCOMYCIN TR: 33 ug/mL — AB (ref 15–20)

## 2017-03-26 LAB — CREATININE, SERUM
CREATININE: 1.45 mg/dL — AB (ref 0.61–1.24)
GFR, EST NON AFRICAN AMERICAN: 57 mL/min — AB (ref >60)

## 2017-03-26 MED ORDER — INSULIN GLARGINE 100 UNIT/ML ~~LOC~~ SOLN
25.0000 [IU] | Freq: Every day | SUBCUTANEOUS | Status: DC
  Administered 2017-03-26 – 2017-03-27 (×2): 25 [IU] via SUBCUTANEOUS
  Filled 2017-03-26 (×2): qty 0.25

## 2017-03-26 MED ORDER — METRONIDAZOLE 500 MG PO TABS
500.0000 mg | ORAL_TABLET | Freq: Three times a day (TID) | ORAL | Status: DC
  Administered 2017-03-26 – 2017-04-01 (×18): 500 mg via ORAL
  Filled 2017-03-26 (×18): qty 1

## 2017-03-26 MED ORDER — MORPHINE SULFATE (PF) 2 MG/ML IV SOLN
4.0000 mg | INTRAVENOUS | Status: DC | PRN
  Administered 2017-03-26 – 2017-03-31 (×10): 4 mg via INTRAVENOUS
  Filled 2017-03-26 (×10): qty 2

## 2017-03-26 MED ORDER — DEXTROSE 5 % IV SOLN
2.0000 g | INTRAVENOUS | Status: DC
  Administered 2017-03-26 – 2017-03-31 (×6): 2 g via INTRAVENOUS
  Filled 2017-03-26 (×9): qty 2

## 2017-03-26 NOTE — Progress Notes (Signed)
[facility] for Infectious Disease    Date of Admission:  03/23/2017   Total days of antibiotics 4        Day 4 vanco/piptazo   ID: Francisco Hutchinson is a 46 y.o. male with HIV, DM2, CKD3 admitted with necrotizing fasciitis of buttock Active Problems:   Abscess of buttock, left   Status post incision and drainage   Necrotizing fasciitis ([place])   Renal insufficiency   CKD (chronic kidney disease), stage III   Secondary diabetes mellitus with stage 3 chronic kidney disease ([place])   Pressure injury of skin    Subjective: Afebrile. Went to OR yesterday for dressing changes, little debridement needed  Medications:  . amLODipine  5 mg Oral Daily  . Chlorhexidine Gluconate Cloth  6 each Topical Q0600  . dolutegravir  50 mg Oral Daily  . emtricitabine-tenofovir AF  1 tablet Oral Daily  . fenofibrate  160 mg Oral Daily  . heparin  5,000 Units Subcutaneous Q8H  . hydrochlorothiazide  25 mg Oral Daily  . insulin aspart  0-15 Units Subcutaneous TID WC  . insulin aspart  0-5 Units Subcutaneous QHS  . insulin glargine  25 Units Subcutaneous QHS  . mouth rinse  15 mL Mouth Rinse BID  . metoprolol tartrate  100 mg Oral Daily  . mupirocin ointment  1 application Nasal BID  . sertraline  50 mg Oral Daily    Objective: Vital signs in last 24 hours: Temp:  [98.3 F (36.8 C)-99.5 F (37.5 C)] 98.4 F (36.9 C) (08/16 1200) Pulse Rate:  [62-79] 62 (08/16 1200) Resp:  [15-32] 17 (08/16 1200) BP: (102-136)/(58-88) 119/80 (08/16 1200) SpO2:  [90 %-100 %] 100 % (08/16 1200) Physical Exam  Constitutional: He is oriented to person, place, and time. He appears well-developed and well-nourished. No distress.  HENT:  Mouth/Throat: Oropharynx is clear and moist. No oropharyngeal exudate.  Cardiovascular: Normal rate, regular rhythm and normal heart sounds. Exam reveals no gallop and no friction rub.  No murmur heard.  Pulmonary/Chest: Effort normal and breath sounds normal. No respiratory  distress. He has no wheezes.  Abdominal: Soft. Bowel sounds are normal. He exhibits no distension. mildly tenderness.  gu = foley in place, dressing in place from I x D  Neurological: He is alert and oriented to person, place, and time.  Skin: Skin is warm and dry. No rash noted. No erythema.  Psychiatric: He has a normal mood and affect. His behavior is normal.    Lab Results  Recent Labs  03/24/17 0319 03/25/17 0304 03/26/17 0246  WBC 26.3* 15.3*  --   HGB 11.6* 10.4*  --   HCT 34.3* 30.6*  --   NA 131* 133* 133*  K 3.4* 3.7 3.9  CL 95* 100* 100*  CO2 27 27 23   BUN 28* 31* 25*  CREATININE 1.47* 1.54* 1.45*  1.45*   Liver Panel  Recent Labs  03/25/17 0304  PROT 6.2*  ALBUMIN 1.8*  AST 18  ALT 14*  ALKPHOS 61  BILITOT 0.6    Microbiology: polymicrobial Studies/Results: No results found.   Assessment/Plan: Necrotizing fasciitis - polymicrobial including MRSA, proteus, group b strep. Will change abtx to include ceftriaxone 2gm IV daily plus oral metronidazole for anaerobic coverage. Continue on vancomycin but currently he is supratherapeutic. Pharmacy adjusting dosage  Therapeutic drug monitoring = pt has elevated trough of 33. vanco currently on hold. Until repeat leave < 20 likely recheck on Saturday  hiv disease = continue  on home meds.  Baxter Flattery [facility] for Infectious Diseases Cell: [phone] Pager: [phone]  03/26/2017, 4:33 PM

## 2017-03-26 NOTE — Progress Notes (Signed)
Pharmacy Antibiotic Note  Francisco Hutchinson is a 46 y.o. male admitted on 03/23/2017 with multiple chronic medical conditions including HIV and history of lymphoma.  Presents with copious foul-smelling drainage and erythema and edema involving the left buttock.   During debridement it was discovered to be perirectal and perineal necrotizing fasciitis that involved the periurethral area around the bulb.  Pharmacy has been consulted for vancomycin and zosyn dosing for cellulitis.  Vancomycin trough = 33 mcg/ml on 1g IV q12h  Plan: Hold vancomycin, check random level in AM Continue Zosyn 3.375g IV Q8H infused over 4hrs. Daily Scr Follow cultures, renal function and clinical course, ID recommendations   Height: 5\' 11"  (180.3 cm) Weight: 226 lb 6.6 oz (102.7 kg) IBW/kg (Calculated) : 75.3  Temp (24hrs), Avg:98.6 F (37 C), Min:97.8 F (36.6 C), Max:99.5 F (37.5 C)   Recent Labs Lab 03/23/17 1343 03/23/17 1347 03/24/17 0319 03/25/17 0304 03/26/17 0246 03/26/17 1328  WBC  --  29.1* 26.3* 15.3*  --   --   CREATININE  --  1.45* 1.47* 1.54* 1.45*  1.45*  --   LATICACIDVEN 1.38  --   --   --   --   --   VANCOTROUGH  --   --   --   --   --  33*    Estimated Creatinine Clearance: 79.4 mL/min (A) (by C-G formula based on SCr of 1.45 mg/dL (H)).    No Known Allergies  Antimicrobials this admission:  8/13 Vanc >> 8/13 Zosyn >>  Dose adjustments this admission:  8/16 VT = 33 on 1g q12h - HOLD check random level in AM (~24hr from last dose)  Microbiology results:  8/13 L buttock abscess: MRSA, Proteus mirabilis, Strep anginosis 8/13 MRSA PCR: 8/13  Thank you for allowing pharmacy to be a part of this patient's care.  Peggyann Juba, PharmD, BCPS Pager: [phone] 03/26/2017, 2:16 PM

## 2017-03-26 NOTE — Progress Notes (Signed)
[facility] CONSULT PROGRESS NOTE    Progress Note  Francisco Hutchinson  NUU:[id number] DOB: June 22, 1971 DOA: 03/23/2017 PCP: Marco Collie, MD     Brief Narrative:   Francisco Hutchinson is an 46 y.o. male past medical history of diabetes mellitus, HIV previous stroke admitted on 12/21/2016 for left buttock pain and an abscess which was draining at home. General surgery and urology were consulted and was taken to the OR and underwent incision and debridement with extensive facia and soft tissue debridement.  Assessment/Plan:   Abscess of buttock, left/ Perineal Necrotizing fasciitis [facility]): General surgery and urology were consulted as there was involvement of the perineum and the bulbospongiosus muscle. Second I&D on 8.15 2018. ID was consulted who recommended to continue IV vancomycin and Zosyn. Cultures from the surgery grew gram-negative rods and moderate gram-positive cocci, sensitivities pending.  Diabetes mellitus type 2 with chronic renal disease: Increase long-acting insulin continue CBGs before meals and at bedtime. Range of BG stable 140-200. He is currently nothing by mouth A1c was 10.9 on admission.  History of CVA's: With 2 watershed CVAs.  Chronic renal disease stage III: Currently stage III need to seems to be his baseline. Fluids as he is tolerating orals. Basic metabolic panels pending.  Deconditioning: Physical therapy evaluation is pending.  HIV: Continue current home meds. CD4 count 310 HIV RNA 50  DVT prophylaxis: Lovenox Family Communication:none Disposition Plan/Barrier to D/C: per surgery Code Status:     Code Status Orders        Start     Ordered   03/23/17 2021  Full code  Continuous     03/23/17 2020    Code Status History    Date Active Date Inactive Code Status Order ID Comments User Context   This patient has a current code status but no historical code status.        IV Access:    Peripheral IV   Procedures and  diagnostic studies:   No results found.   Medical Consultants:    None.  Anti-Infectives:   IV vancomycin and Zosyn.   Subjective:    Francisco Hutchinson tolerated his diet has no new complains.  Objective:    Vitals:   03/26/17 0200 03/26/17 0300 03/26/17 0400 03/26/17 0600  BP: 114/77  131/77 136/88  Pulse: 62  62 66  Resp: (!) 30  (!) 32 (!) 22  Temp:  99.4 F (37.4 C)    TempSrc:  Axillary    SpO2: 92%  99% 97%  Weight:      Height:        Intake/Output Summary (Last 24 hours) at 03/26/17 0717 Last data filed at 03/26/17 0600  Gross per 24 hour  Intake          1945.83 ml  Output             1350 ml  Net           595.83 ml   Filed Weights   03/23/17 2020  Weight: 102.7 kg (226 lb 6.6 oz)    Exam: General exam:In no acute distress Respiratory system: Good air movement and clear to auscultation. Cardiovascular system: Regular rate and rhythm, positive S1-S2. Gastrointestinal system: Abdomen is soft nontender nondistended Central nervous system: Alert and oriented. No focal neurological deficits. Extremities: No lower extremity edema. Skin: Gauzes in the perineum and buttock area. Psychiatry: Judgement and insight appear normal. Mood & affect appropriate.    Data Reviewed:  Labs: Basic Metabolic Panel:  Recent Labs Lab 03/23/17 1347 03/24/17 0319 03/25/17 0304 03/26/17 0246  NA 133* 131* 133*  --   K 3.8 3.4* 3.7  --   CL 97* 95* 100*  --   CO2 28 27 27   --   GLUCOSE 121* 220* 195*  --   BUN 28* 28* 31*  --   CREATININE 1.45* 1.47* 1.54* 1.45*  CALCIUM 8.0* 7.6* 7.3*  --    GFR Estimated Creatinine Clearance: 79.4 mL/min (A) (by C-G formula based on SCr of 1.45 mg/dL (H)). Liver Function Tests:  Recent Labs Lab 03/23/17 1347 03/25/17 0304  AST 22 18  ALT 20 14*  ALKPHOS 99 67  BILITOT 0.8 0.6  PROT 7.4 6.2*  ALBUMIN 2.1* 1.8*   No results for input(s): LIPASE, AMYLASE in the last 168 hours. No results for input(s):  AMMONIA in the last 168 hours. Coagulation profile No results for input(s): INR, PROTIME in the last 168 hours.  CBC:  Recent Labs Lab 03/23/17 1347 03/24/17 0319 03/25/17 0304  WBC 29.1* 26.3* 15.3*  NEUTROABS 26.4*  --   --   HGB 12.4* 11.6* 10.4*  HCT 36.1* 34.3* 30.6*  MCV 79.3 79.4 79.1  PLT 436* 386 387   Cardiac Enzymes: No results for input(s): CKTOTAL, CKMB, CKMBINDEX, TROPONINI in the last 168 hours. BNP (last 3 results) No results for input(s): PROBNP in the last 8760 hours. CBG:  Recent Labs Lab 03/25/17 0722 03/25/17 1223 03/25/17 1428 03/25/17 1605 03/25/17 2035  GLUCAP 145* 101* 91 95 157*   D-Dimer: No results for input(s): DDIMER in the last 72 hours. Hgb A1c:  Recent Labs  03/23/17 2052  HGBA1C 10.9*   Lipid Profile: No results for input(s): CHOL, HDL, LDLCALC, TRIG, CHOLHDL, LDLDIRECT in the last 72 hours. Thyroid function studies: No results for input(s): TSH, T4TOTAL, T3FREE, THYROIDAB in the last 72 hours.  Invalid input(s): FREET3 Anemia work up: No results for input(s): VITAMINB12, FOLATE, FERRITIN, TIBC, IRON, RETICCTPCT in the last 72 hours. Sepsis Labs:  Recent Labs Lab 03/23/17 1343 03/23/17 1347 03/24/17 0319 03/25/17 0304  WBC  --  29.1* 26.3* 15.3*  LATICACIDVEN 1.38  --   --   --    Microbiology Recent Results (from the past 240 hour(s))  Aerobic/Anaerobic Culture (surgical/deep wound)     Status: None (Preliminary result)   Collection Time: 03/23/17  3:44 PM  Result Value Ref Range Status   Specimen Description ABSCESS LEFT BUTTOCKS  Final   Special Requests NONE  Final   Gram Stain   Final    ABUNDANT WBC PRESENT, PREDOMINANTLY PMN FEW GRAM NEGATIVE RODS MODERATE GRAM POSITIVE COCCI IN PAIRS    Culture   Final    RARE STAPHYLOCOCCUS AUREUS RARE PROTEUS MIRABILIS MODERATE STREPTOCOCCUS ANGINOSIS SUSCEPTIBILITIES TO FOLLOW Performed at [facility], [place] [address], [place], [name] [address]     Report Status PENDING  Incomplete  MRSA PCR Screening     Status: Abnormal   Collection Time: 03/23/17  9:00 PM  Result Value Ref Range Status   MRSA by PCR POSITIVE (A) NEGATIVE Final    Comment:        The GeneXpert MRSA Assay (FDA approved for NASAL specimens only), is one component of a comprehensive MRSA colonization surveillance program. It is not intended to diagnose MRSA infection nor to guide or monitor treatment for MRSA infections. RESULT CALLED TO, READ BACK BY AND VERIFIED WITH: KOTIAN,S RN 8.13.18 @2311  ZANDO,C  Medications:   . amLODipine  5 mg Oral Daily  . Chlorhexidine Gluconate Cloth  6 each Topical Q0600  . dolutegravir  50 mg Oral Daily  . emtricitabine-tenofovir AF  1 tablet Oral Daily  . fenofibrate  160 mg Oral Daily  . heparin  5,000 Units Subcutaneous Q8H  . hydrochlorothiazide  25 mg Oral Daily  . insulin aspart  0-15 Units Subcutaneous TID WC  . insulin aspart  0-5 Units Subcutaneous QHS  . insulin glargine  20 Units Subcutaneous QHS  . mouth rinse  15 mL Mouth Rinse BID  . metoprolol tartrate  100 mg Oral Daily  . mupirocin ointment  1 application Nasal BID  . sertraline  50 mg Oral Daily   Continuous Infusions: . sodium chloride 75 mL/hr at 03/26/17 0600  . piperacillin-tazobactam (ZOSYN)  IV 3.375 g (03/26/17 0338)  . vancomycin Stopped (03/26/17 0300)     LOS: 3 days   Charlynne Cousins  [facility] Pager [phone]  *Please refer to [email or web address], password [email or web address] to get updated schedule on who will round on this patient, as hospitalists switch teams weekly. If 7PM-7AM, please contact night-coverage at [email or web address], password [email or web address] for any overnight needs.  03/26/2017, 7:17 AM

## 2017-03-26 NOTE — Progress Notes (Signed)
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 Excell Seltzer, 08/13 - S/P Irrigation and debridement, dressing change, and exploration of perineal wound, Dr. Excell Seltzer, 08/15 - S/P Debridement of Fournier's gangrene and cystoscopy and insertion of Foley catheter, Dr. Matilde Sprang, 08/15 - dressing change qshift, wet to dry  FEN: carb mod VTE: SCD's, heparin ID: Zosyn & Vanc 08/13>>  Cultures pending, gram (-) rods and gram (+) cocci Foley: yes Follow up: TBD  DISPO: moved out of unit to floor. qshift dressing changes, morphine IV for dressing changes. Pt will likely need HH.    LOS: 3 days    Subjective:  CC: forniers gangrene  Pt's pain is well controlled. Tolerating diet. No fevers, no nausea or vomiting.   Objective: Vital signs in last 24 hours: Temp:  [97.8 F (36.6 C)-99.5 F (37.5 C)] 99.4 F (37.4 C) (08/16 0300) Pulse Rate:  [58-79] 73 (08/16 0800) Resp:  [15-32] 19 (08/16 0800) BP: (102-162)/(58-98) 120/68 (08/16 0800) SpO2:  [90 %-100 %] 98 % (08/16 0800) Last BM Date: 03/25/17  Intake/Output from previous day: 08/15 0701 - 08/16 0700 In: 1945.8 [I.V.:1845.8; IV Piggyback:100] Out: 1350 [Urine:1350] Intake/Output this shift: Total I/O In: 188.8 [I.V.:188.8] Out: -   PE: Gen:  Alert, NAD, pleasant, cooperative Pulm: rate and effort normal GU: foley in place, wound appears clean without purulent drainage, few area of slough removed easily, multiple areas of tracking noted (superior, superiormedial, posterior and  mid wound) all repacked with wet to dry.   Anti-infectives: Anti-infectives    Start     Dose/Rate Route Frequency Ordered Stop   03/25/17 1238  piperacillin-tazobactam (ZOSYN) 3.375 GM/50ML IVPB    Comments:  Key, Kristopher   : cabinet override      03/25/17 1238 03/25/17 1336   03/24/17 1000  emtricitabine-tenofovir AF (DESCOVY) 200-25 MG per tablet 1 tablet     1 tablet Oral Daily 03/23/17 2020     03/24/17 0200  vancomycin (VANCOCIN) IVPB 1000 mg/200 mL premix     1,000 mg 200 mL/hr over 60 Minutes Intravenous Every 12 hours 03/23/17 2110     03/23/17 2100  dolutegravir (TIVICAY) tablet 50 mg     50 mg Oral Daily 03/23/17 2020     03/23/17 2030  piperacillin-tazobactam (ZOSYN) IVPB 3.375 g     3.375 g 12.5 mL/hr over 240 Minutes Intravenous Every 8 hours 03/23/17 2027     03/23/17 1428  vancomycin (VANCOCIN) 1-5 GM/200ML-% IVPB    Comments:  Armistead, Lacey   : cabinet override      03/23/17 1428 03/23/17 1518   03/23/17 1215  piperacillin-tazobactam (ZOSYN) IVPB 3.375 g     3.375 g 100 mL/hr over 30 Minutes Intravenous  Once 03/23/17 1211 03/23/17 1353   03/23/17 1215  vancomycin (VANCOCIN) IVPB 1000 mg/200 mL premix     1,000 mg 200 mL/hr over 60 Minutes Intravenous  Once 03/23/17 1211 03/23/17 1518      Lab Results:   Recent Labs  03/24/17 0319 03/25/17 0304  WBC 26.3* 15.3*  HGB 11.6*  10.4*  HCT 34.3* 30.6*  PLT 386 387   BMET  Recent Labs  03/25/17 0304 03/26/17 0246  NA 133* 133*  K 3.7 3.9  CL 100* 100*  CO2 27 23  GLUCOSE 195* 194*  BUN 31* 25*  CREATININE 1.54* 1.45*  1.45*  CALCIUM 7.3* 7.5*   PT/INR No results for input(s): LABPROT, INR in the last 72 hours. CMP     Component Value Date/Time   NA 133 (L) 03/26/2017 0246   K 3.9 03/26/2017 0246   CL 100 (L) 03/26/2017 0246   CO2 23 03/26/2017 0246   GLUCOSE 194 (H) 03/26/2017 0246   BUN 25 (H) 03/26/2017 0246   CREATININE 1.45 (H) 03/26/2017 0246   CREATININE 1.45 (H) 03/26/2017  0246   CALCIUM 7.5 (L) 03/26/2017 0246   PROT 6.2 (L) 03/25/2017 0304   ALBUMIN 1.8 (L) 03/25/2017 0304   AST 18 03/25/2017 0304   ALT 14 (L) 03/25/2017 0304   ALKPHOS 67 03/25/2017 0304   BILITOT 0.6 03/25/2017 0304   GFRNONAA 57 (L) 03/26/2017 0246   GFRNONAA 57 (L) 03/26/2017 0246   GFRAA >60 03/26/2017 0246   GFRAA >60 03/26/2017 0246   Lipase  No results found for: LIPASE  Studies/Results: No results found.    Kalman Drape , [facility] 03/26/2017, 9:29 AM Pager: [phone] Consults: [phone] Mon-Fri 7:00 am-4:30 pm Sat-Sun 7:00 am-11:30 am

## 2017-03-26 NOTE — Progress Notes (Signed)
[facility] Infusion Coordinator will follow pt with ID to support home IV ABX at DC if needed/ordered.  If patient discharges after hours, please call [phone].   Larry Sierras 03/26/2017, 10:48 PM

## 2017-03-27 LAB — HIV-1 RNA QUANT-NO REFLEX-BLD
HIV 1 RNA Quant: <20 copies/mL
LOG10 HIV-1 RNA: UNDETERMINED log10copy/mL

## 2017-03-27 LAB — BASIC METABOLIC PANEL
ANION GAP: 8 (ref 5–15)
BUN: 22 mg/dL — AB (ref 6–20)
CALCIUM: 7.6 mg/dL — AB (ref 8.9–10.3)
CO2: 26 mmol/L (ref 22–32)
Chloride: 101 mmol/L (ref 101–111)
Creatinine, Ser: 1.27 mg/dL — ABNORMAL HIGH (ref 0.61–1.24)
GFR calc Af Amer: >60 mL/min (ref >60)
GFR calc non Af Amer: >60 mL/min (ref >60)
GLUCOSE: 108 mg/dL — AB (ref 65–99)
Potassium: 4.1 mmol/L (ref 3.5–5.1)
Sodium: 135 mmol/L (ref 135–145)

## 2017-03-27 LAB — GLUCOSE, CAPILLARY
GLUCOSE-CAPILLARY: 110 mg/dL — AB (ref 65–99)
GLUCOSE-CAPILLARY: 192 mg/dL — AB (ref 65–99)
Glucose-Capillary: 93 mg/dL (ref 65–99)
Glucose-Capillary: 273 mg/dL — ABNORMAL HIGH (ref 65–99)

## 2017-03-27 LAB — VANCOMYCIN, RANDOM: Vancomycin Rm: 20

## 2017-03-27 MED ORDER — VANCOMYCIN HCL IN DEXTROSE 750-5 MG/150ML-% IV SOLN
750.0000 mg | INTRAVENOUS | Status: DC
  Administered 2017-03-27 – 2017-03-29 (×3): 750 mg via INTRAVENOUS
  Filled 2017-03-27 (×4): qty 150

## 2017-03-27 NOTE — Progress Notes (Addendum)
[facility] for Infectious Disease    Date of Admission:  03/23/2017   Total days of antibiotics 5        Day 5 vanco/piptazo   ID: Francisco Hutchinson is a 46 y.o. male with HIV, DM2, CKD3 admitted with necrotizing fasciitis of buttock Active Problems:   Abscess of buttock, left   Status post incision and drainage   Necrotizing fasciitis ([place])   Renal insufficiency   CKD (chronic kidney disease), stage III   Secondary diabetes mellitus with stage 3 chronic kidney disease ([place])   Pressure injury of skin    Subjective: Afebrile. Surgery does not believe that he will need further debridement  his mother at his bedside  Medications:  . amLODipine  5 mg Oral Daily  . Chlorhexidine Gluconate Cloth  6 each Topical Q0600  . dolutegravir  50 mg Oral Daily  . emtricitabine-tenofovir AF  1 tablet Oral Daily  . fenofibrate  160 mg Oral Daily  . heparin  5,000 Units Subcutaneous Q8H  . hydrochlorothiazide  25 mg Oral Daily  . insulin aspart  0-15 Units Subcutaneous TID WC  . insulin aspart  0-5 Units Subcutaneous QHS  . insulin glargine  25 Units Subcutaneous QHS  . mouth rinse  15 mL Mouth Rinse BID  . metoprolol tartrate  100 mg Oral Daily  . metroNIDAZOLE  500 mg Oral Q8H  . mupirocin ointment  1 application Nasal BID  . sertraline  50 mg Oral Daily    Objective: Vital signs in last 24 hours: Temp:  [98 F (36.7 C)-99.2 F (37.3 C)] 99.2 F (37.3 C) (08/17 1432) Pulse Rate:  [57-69] 59 (08/17 1432) Resp:  [16] 16 (08/17 1432) BP: (113-132)/(80-81) 126/81 (08/17 1432) SpO2:  [96 %-97 %] 96 % (08/17 1432) Physical Exam  Constitutional: He is oriented to person, place, and time. He appears well-developed and well-nourished. No distress.  HENT:  Mouth/Throat: Oropharynx is clear and moist. No oropharyngeal exudate.  Cardiovascular: Normal rate, regular rhythm and normal heart sounds. Exam reveals no gallop and no friction rub.  No murmur heard.  Pulmonary/Chest:  Effort normal and breath sounds normal. No respiratory distress. He has no wheezes.  Abdominal: Soft. Bowel sounds are normal. No distention gu = foley in place, dressing in place from I x D  Psychiatric: slight flat affect  Lab Results  Recent Labs  03/25/17 0304 03/26/17 0246 03/27/17 0508  WBC 15.3*  --   --   HGB 10.4*  --   --   HCT 30.6*  --   --   NA 133* 133* 135  K 3.7 3.9 4.1  CL 100* 100* 101  CO2 '27 23 26  '$ BUN 31* 25* 22*  CREATININE 1.54* 1.45*  1.45* 1.27*   Liver Panel  Recent Labs  03/25/17 0304  PROT 6.2*  ALBUMIN 1.8*  AST 18  ALT 14*  ALKPHOS 74  BILITOT 0.6    Microbiology: polymicrobial Studies/Results: No results found.   Assessment/Plan: Necrotizing fasciitis - polymicrobial including MRSA, proteus, group b strep. Will change abtx to include ceftriaxone 2gm IV daily plus oral metronidazole for anaerobic coverage.  Recommend to treat for a total of 28 d including the days he has received here. He will need picc line. I will place order for left upper arm (right arm has hx of nec fasctomy)  Therapeutic drug monitoring = random level is at 20, no longer subpratherapeutic. Will defer to pharmacy to adjust dose accordingly  Defer to surgery for recs on wound care and if he should be referred to wound care center and whether wound vac prior to discharge. May need air mattress for home  hiv disease = continue on tivicay and descovy daily  Home health orders listed below in case he discharges over the weekend. Will see back on monday  Dr hatcher available for questions ------------------------------------------------------------- Diagnosis: Nec fasciitis  Culture Result: polymicrobial including mrsa  No Known Allergies  OPAT Orders Discharge antibiotics: vancomycin plus ceftriaxone 2gm if daily Per pharmacy protocol --vanco Aim for Vancomycin trough 15-20 (unless otherwise indicated) Duration: 28 d End Date: Sep 10th  [place] Per  Protocol:  Labs weekly while on IV antibiotics: _x_ CBC with differential _x_ BMP twice per week __ CMP __x CRP _x_ ESR _x_ Vancomycin trough  _x_ Please pull PIC at completion of IV antibiotics   Fax weekly labs to [phone]  Clinic Follow Up Appt: Francisco Hutchinson  @ 3-4 wk    Francisco Hutchinson [facility] for Infectious Diseases Cell: [phone] Pager: [phone]  03/27/2017, 3:11 PM

## 2017-03-27 NOTE — Progress Notes (Signed)
[facility] CONSULT PROGRESS NOTE    Progress Note  Francisco Hutchinson  YYQ:[id number] DOB: January 14, 1971 DOA: 03/23/2017 PCP: Marco Collie, MD     Brief Narrative:   Francisco Hutchinson is an 46 y.o. male past medical history of diabetes mellitus, HIV previous stroke admitted on 12/21/2016 for left buttock pain and an abscess which was draining at home. General surgery and urology were consulted and was taken to the OR and underwent incision and debridement with extensive facia and soft tissue debridement.  Assessment/Plan:   Abscess of buttock, left/ Perineal Necrotizing fasciitis [facility]): General surgery and urology were consulted as there was involvement of the perineum and the bulbospongiosus muscle. Second I&D on 8.15 2018. ID is on board. Cultures from the surgery grew gram-negative rods and moderate gram-positive cocci, sensitivities pending.  Diabetes mellitus type 2 with chronic renal disease: Increase long-acting insulin continue CBGs before meals and at bedtime. A1c was 10.9 on admission. Blood glucose improved today we'll continue sliding scale insulin. We'll continue to follow along.  History of CVA's: With 2 watershed CVAs.  Chronic renal disease stage III: Currently stage III  seems to be his baseline.  Deconditioning: Physical therapy evaluation is pending.  HIV: Continue current home meds. CD4 count 310 HIV RNA 50  DVT prophylaxis: Lovenox Family Communication:none Disposition Plan/Barrier to D/C: per surgery Code Status:     Code Status Orders        Start     Ordered   03/23/17 2021  Full code  Continuous     03/23/17 2020    Code Status History    Date Active Date Inactive Code Status Order ID Comments User Context   This patient has a current code status but no historical code status.        IV Access:    Peripheral IV   Procedures and diagnostic studies:   No results found.   Medical Consultants:     None.  Anti-Infectives:   IV vancomycin and Zosyn.   Subjective:    Francisco Hutchinson he is tolerating his diet and no new complaints.  Objective:    Vitals:   03/26/17 0959 03/26/17 1200 03/26/17 2010 03/27/17 0550  BP:  119/80 113/80 132/80  Pulse: 65 62 69 (!) 57  Resp: 19 17 16 16   Temp:  98.4 F (36.9 C) 98.7 F (37.1 C) 98 F (36.7 C)  TempSrc:  Oral Oral Oral  SpO2: 95% 100% 96% 97%  Weight:      Height:        Intake/Output Summary (Last 24 hours) at 03/27/17 1033 Last data filed at 03/27/17 1004  Gross per 24 hour  Intake           504.83 ml  Output             1975 ml  Net         -1470.17 ml   Filed Weights   03/23/17 2020  Weight: 102.7 kg (226 lb 6.6 oz)    Exam: General exam:In no acute distress Respiratory system: Good air movement and clear to auscultation. Cardiovascular system: Regular rate and rhythm, positive S1-S2. Gastrointestinal system: Abdomen is soft nontender nondistended Central nervous system: Alert and oriented. No focal neurological deficits. Extremities: No lower extremity edema. Skin: Gauzes in the perineum and buttock area. Psychiatry: Judgement and insight appear normal. Mood & affect appropriate.    Data Reviewed:    Labs: Basic Metabolic Panel:  Recent Labs Lab 03/23/17 8889  03/24/17 0319 03/25/17 0304 03/26/17 0246 03/27/17 0508  NA 133* 131* 133* 133* 135  K 3.8 3.4* 3.7 3.9 4.1  CL 97* 95* 100* 100* 101  CO2 28 27 27 23 26   GLUCOSE 121* 220* 195* 194* 108*  BUN 28* 28* 31* 25* 22*  CREATININE 1.45* 1.47* 1.54* 1.45*  1.45* 1.27*  CALCIUM 8.0* 7.6* 7.3* 7.5* 7.6*   GFR Estimated Creatinine Clearance: 90.6 mL/min (A) (by C-G formula based on SCr of 1.27 mg/dL (H)). Liver Function Tests:  Recent Labs Lab 03/23/17 1347 03/25/17 0304  AST 22 18  ALT 20 14*  ALKPHOS 99 67  BILITOT 0.8 0.6  PROT 7.4 6.2*  ALBUMIN 2.1* 1.8*   No results for input(s): LIPASE, AMYLASE in the last 168  hours. No results for input(s): AMMONIA in the last 168 hours. Coagulation profile No results for input(s): INR, PROTIME in the last 168 hours.  CBC:  Recent Labs Lab 03/23/17 1347 03/24/17 0319 03/25/17 0304  WBC 29.1* 26.3* 15.3*  NEUTROABS 26.4*  --   --   HGB 12.4* 11.6* 10.4*  HCT 36.1* 34.3* 30.6*  MCV 79.3 79.4 79.1  PLT 436* 386 387   Cardiac Enzymes: No results for input(s): CKTOTAL, CKMB, CKMBINDEX, TROPONINI in the last 168 hours. BNP (last 3 results) No results for input(s): PROBNP in the last 8760 hours. CBG:  Recent Labs Lab 03/26/17 0728 03/26/17 1157 03/26/17 1734 03/26/17 2214 03/27/17 0745  GLUCAP 137* 158* 146* 220* 93   D-Dimer: No results for input(s): DDIMER in the last 72 hours. Hgb A1c: No results for input(s): HGBA1C in the last 72 hours. Lipid Profile: No results for input(s): CHOL, HDL, LDLCALC, TRIG, CHOLHDL, LDLDIRECT in the last 72 hours. Thyroid function studies: No results for input(s): TSH, T4TOTAL, T3FREE, THYROIDAB in the last 72 hours.  Invalid input(s): FREET3 Anemia work up: No results for input(s): VITAMINB12, FOLATE, FERRITIN, TIBC, IRON, RETICCTPCT in the last 72 hours. Sepsis Labs:  Recent Labs Lab 03/23/17 1343 03/23/17 1347 03/24/17 0319 03/25/17 0304  WBC  --  29.1* 26.3* 15.3*  LATICACIDVEN 1.38  --   --   --    Microbiology Recent Results (from the past 240 hour(s))  Aerobic/Anaerobic Culture (surgical/deep wound)     Status: None (Preliminary result)   Collection Time: 03/23/17  3:44 PM  Result Value Ref Range Status   Specimen Description ABSCESS LEFT BUTTOCKS  Final   Special Requests NONE  Final   Gram Stain   Final    ABUNDANT WBC PRESENT, PREDOMINANTLY PMN FEW GRAM NEGATIVE RODS MODERATE GRAM POSITIVE COCCI IN PAIRS Performed at [facility], [place] [address] [place],  [address]    Culture   Final    RARE METHICILLIN RESISTANT STAPHYLOCOCCUS AUREUS RARE PROTEUS MIRABILIS MODERATE  STREPTOCOCCUS ANGINOSIS NO ANAEROBES ISOLATED; CULTURE IN PROGRESS FOR 5 DAYS    Report Status PENDING  Incomplete   Organism ID, Bacteria METHICILLIN RESISTANT STAPHYLOCOCCUS AUREUS  Final   Organism ID, Bacteria PROTEUS MIRABILIS  Final   Organism ID, Bacteria STREPTOCOCCUS ANGINOSIS  Final      Susceptibility   Methicillin resistant staphylococcus aureus - MIC*    CIPROFLOXACIN <=0.5 SENSITIVE Sensitive     ERYTHROMYCIN >=8 RESISTANT Resistant     GENTAMICIN <=0.5 SENSITIVE Sensitive     OXACILLIN >=4 RESISTANT Resistant     TETRACYCLINE <=1 SENSITIVE Sensitive     VANCOMYCIN <=0.5 SENSITIVE Sensitive     TRIMETH/SULFA <=10 SENSITIVE Sensitive  CLINDAMYCIN <=0.25 SENSITIVE Sensitive     RIFAMPIN <=0.5 SENSITIVE Sensitive     Inducible Clindamycin NEGATIVE Sensitive     * RARE METHICILLIN RESISTANT STAPHYLOCOCCUS AUREUS   Proteus mirabilis - MIC*    AMPICILLIN >=32 RESISTANT Resistant     CEFAZOLIN <=4 SENSITIVE Sensitive     CEFEPIME <=1 SENSITIVE Sensitive     CEFTAZIDIME <=1 SENSITIVE Sensitive     CEFTRIAXONE <=1 SENSITIVE Sensitive     CIPROFLOXACIN 2 INTERMEDIATE Intermediate     GENTAMICIN <=1 SENSITIVE Sensitive     IMIPENEM 2 SENSITIVE Sensitive     TRIMETH/SULFA >=320 RESISTANT Resistant     AMPICILLIN/SULBACTAM 8 SENSITIVE Sensitive     PIP/TAZO <=4 SENSITIVE Sensitive     * RARE PROTEUS MIRABILIS   Streptococcus anginosis - MIC*    PENICILLIN <=0.06 SENSITIVE Sensitive     CEFTRIAXONE 0.25 SENSITIVE Sensitive     ERYTHROMYCIN >=8 RESISTANT Resistant     LEVOFLOXACIN 0.5 SENSITIVE Sensitive     VANCOMYCIN 0.5 SENSITIVE Sensitive     * MODERATE STREPTOCOCCUS ANGINOSIS  MRSA PCR Screening     Status: Abnormal   Collection Time: 03/23/17  9:00 PM  Result Value Ref Range Status   MRSA by PCR POSITIVE (A) NEGATIVE Final    Comment:        The GeneXpert MRSA Assay (FDA approved for NASAL specimens only), is one component of a comprehensive MRSA  colonization surveillance program. It is not intended to diagnose MRSA infection nor to guide or monitor treatment for MRSA infections. RESULT CALLED TO, READ BACK BY AND VERIFIED WITH: KOTIAN,S RN 8.13.18 @2311  ZANDO,C      Medications:   . amLODipine  5 mg Oral Daily  . Chlorhexidine Gluconate Cloth  6 each Topical Q0600  . dolutegravir  50 mg Oral Daily  . emtricitabine-tenofovir AF  1 tablet Oral Daily  . fenofibrate  160 mg Oral Daily  . heparin  5,000 Units Subcutaneous Q8H  . hydrochlorothiazide  25 mg Oral Daily  . insulin aspart  0-15 Units Subcutaneous TID WC  . insulin aspart  0-5 Units Subcutaneous QHS  . insulin glargine  25 Units Subcutaneous QHS  . mouth rinse  15 mL Mouth Rinse BID  . metoprolol tartrate  100 mg Oral Daily  . metroNIDAZOLE  500 mg Oral Q8H  . mupirocin ointment  1 application Nasal BID  . sertraline  50 mg Oral Daily   Continuous Infusions: . sodium chloride 10 mL/hr at 03/27/17 0954  . cefTRIAXone (ROCEPHIN)  IV 2 g (03/26/17 1751)  . vancomycin       LOS: 4 days   Charlynne Cousins  [facility] Pager [phone]  *Please refer to [email or web address], password [email or web address] to get updated schedule on who will round on this patient, as hospitalists switch teams weekly. If 7PM-7AM, please contact night-coverage at [email or web address], password [email or web address] for any overnight needs.  03/27/2017, 10:33 AM

## 2017-03-27 NOTE — Progress Notes (Signed)
Pharmacy Antibiotic Note  Francisco Hutchinson is a 46 y.o. male admitted on 03/23/2017 with multiple chronic medical conditions including HIV and history of lymphoma.  Presents with copious foul-smelling drainage and erythema and edema involving the left buttock.   During debridement it was discovered to be perirectal and perineal necrotizing fasciitis that involved the periurethral area around the bulb.  Pharmacy has been consulted for vancomycin dosing for cellulitis.  0500 Random vanc=20 ~ 24 hours from last dose  Plan: Vancomycin 750mg  IV q24h starting at 1800 Ceftriaxone 2gm IV q24h (per ID) Daily Scr Follow cultures, renal function and clinical course, ID recommendations   Height: 5\' 11"  (180.3 cm) Weight: 226 lb 6.6 oz (102.7 kg) IBW/kg (Calculated) : 75.3  Temp (24hrs), Avg:98.4 F (36.9 C), Min:98 F (36.7 C), Max:98.7 F (37.1 C)   Recent Labs Lab 03/23/17 1343 03/23/17 1347 03/24/17 0319 03/25/17 0304 03/26/17 0246 03/26/17 1328 03/27/17 0508  WBC  --  29.1* 26.3* 15.3*  --   --   --   CREATININE  --  1.45* 1.47* 1.54* 1.45*  1.45*  --  1.27*  LATICACIDVEN 1.38  --   --   --   --   --   --   VANCOTROUGH  --   --   --   --   --  12*  --   VANCORANDOM  --   --   --   --   --   --  20    Estimated Creatinine Clearance: 90.6 mL/min (A) (by C-G formula based on SCr of 1.27 mg/dL (H)).    No Known Allergies  Antimicrobials this admission:  8/13 Vanc >> 8/13 Zosyn >> 8/16 8/16 ceftriaxone >> 8/16 flagyl po >>  Dose adjustments this admission:  8/16 VT = 33 on 1g q12h -  8/17 RV= 20, ~ 24 hours from last dose  Microbiology results:  8/13 L buttock abscess: MRSA, Proteus mirabilis, Strep anginosis 8/13 MRSA PCR: 8/13  Thank you for allowing pharmacy to be a part of this patient's care.  Dolly Rias RPh 03/27/2017, 9:42 AM Pager [phone]

## 2017-03-27 NOTE — Progress Notes (Signed)
Received call from Gwendlyn Deutscher, who states he is pt's partner. He is requesting information regarding pt's POC after discharge.   Spoke with pt and relayed above. Pt gave verbal permission for Mr. Best to be contacted and communicated with re: POC.   Received a call from Mr. [name]. He is requesting to speak with pt's case manager to arrange necessary paperwork his [facility] policy needs to prepare for pt's arrival home.   Pt gave verbal permission for [name] to speak w/case Freight forwarder.  Called Vinnie Level and discussed above. She states she had to leave early, but will call her colleague, Alinda Sierras, and ask her to follow up with [name]. Notified Vinnie Level that [facility] number [phone]) is listed on face sheet as pt's emergency contact.   Spoke with Alinda Sierras and relayed above. She states she will give [name] a call.

## 2017-03-27 NOTE — Progress Notes (Signed)
This CM was asked to call pt partner [name] [phone]) about discharge needs. Permission was received from pt to call [facility]. This CM spoke with [name] via phone. [name] would like some print outs of pt medical records to send to [facility] to assist him with paying their rent. This CM informed [name] that pt would need to fill out a release of information form for print outs to be given to [employer] for this use. [name] states that he will come to the hospital this weekend to speak with pt about filling out form and getting print outs. [name] also states that pt has a hospital bed at home and is unsure the company it is from. He states that it is not very good and would love a better one if able to have it. He also requests a air mattress for pt for home to assist with his wounds when he get home. Will need MD orders for hospital bed and air mattress at home prior to discharge. Marney Doctor RN,BSN,NCM [phone]

## 2017-03-27 NOTE — Progress Notes (Signed)
Physical Therapy Treatment Patient Details Name: Francisco Hutchinson MRN: [id number] DOB: June 09, 1971 Today's Date: 03/27/2017    History of Present Illness Pt admitted for I&d of L buttock abscess 2* necrotizing facitis.  Pt with hx of CVA, HIV, DM and necrotizing facitis R UE    PT Comments    Pt agreeable to PT but limited by pain; transfer to chair not an option d/t wound location; pt will likely be able to d/c home with HHPT, even if he needs to manage from w/c level initially; discussed with pt as well; will continue to follow   Follow Up Recommendations  Home health PT;Supervision/Assistance - 24 hour (pt reports his aunt and mother can care for him at home)     Equipment Recommendations  None recommended by PT    Recommendations for Other Services       Precautions / Restrictions Precautions Precautions: Fall Restrictions Weight Bearing Restrictions: No    Mobility  Bed Mobility Overal bed mobility: Needs Assistance Bed Mobility: Rolling;Sidelying to Sit;Sit to Supine Rolling: Min assist Sidelying to sit: HOB elevated;Mod assist   Sit to supine: Min assist   General bed mobility comments: assist with trunk to complete rolling, assist with trunk to upright; pt only able tol sitting for ~19minutes d/t pain   Transfers                 General transfer comment: NT d/t gluteal abcess/wound--pt unable to tolerate sitting position  Ambulation/Gait                 Stairs            Wheelchair Mobility    Modified Rankin (Stroke Patients Only)       Balance                                            Cognition Arousal/Alertness: Awake/alert Behavior During Therapy: WFL for tasks assessed/performed Overall Cognitive Status: Within Functional Limits for tasks assessed                                        Exercises General Exercises - Lower Extremity Heel Slides: AROM;Both;5 reps Straight Leg Raises:  AROM;Both;5 reps    General Comments        Pertinent Vitals/Pain Pain Assessment: Faces Faces Pain Scale: Hurts little more Pain Location: gluteal area, incr pain with sitting Pain Descriptors / Indicators: Grimacing Pain Intervention(s): Limited activity within patient's tolerance;Monitored during session;Repositioned    Home Living                      Prior Function            PT Goals (current goals can now be found in the care plan section) Acute Rehab PT Goals Patient Stated Goal: to return to  home PT Goal Formulation: With patient Time For Goal Achievement: 04/08/17 Potential to Achieve Goals: Fair Progress towards PT goals: Progressing toward goals    Frequency    Min 3X/week      PT Plan Current plan remains appropriate    Co-evaluation              AM-PAC PT "6 Clicks" Daily Activity  Outcome Measure  Difficulty turning over in bed (including  adjusting bedclothes, sheets and blankets)?: Unable Difficulty moving from lying on back to sitting on the side of the bed? : Unable Difficulty sitting down on and standing up from a chair with arms (e.g., wheelchair, bedside commode, etc,.)?: Unable Help needed moving to and from a bed to chair (including a wheelchair)?: Total Help needed walking in hospital room?: Total Help needed climbing 3-5 steps with a railing? : Total 6 Click Score: 6    End of Session   Activity Tolerance: Patient limited by pain Patient left: in bed;with call bell/phone within reach;Other (comment) (bedrails x4 at pt request)   PT Visit Diagnosis: Muscle weakness (generalized) (M62.81);Difficulty in walking, not elsewhere classified (R26.2)     Time: 8280-0349 PT Time Calculation (min) (ACUTE ONLY): 19 min  Charges:  $Therapeutic Activity: 8-22 mins                    G CodesKenyon Ana, PT Pager: [phone] 03/27/2017    [facility] 03/27/2017, 12:40 PM

## 2017-03-27 NOTE — Progress Notes (Signed)
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 Excell Seltzer, 08/13 - S/P Irrigation and debridement, dressing change, and exploration of perineal wound, Dr. Excell Seltzer, 08/15 - S/P Debridement of Fournier's gangrene and cystoscopy and insertion of Foley catheter, Dr. Matilde Sprang, 08/15 - dressing change qshift, wet to dry  FEN: carb mod VTE: SCD's, heparin ID: Zosyn & Vanc 08/13>>  Cultures pending, gram (-) rods and gram (+) cocci Foley: yes Follow up: TBD  DISPO: qshift dressing changes, morphine IV for dressing changes. Pt will likely need HH. Will assess wound if dressing change is needed today after BM.   LOS: 4 days    Subjective:  CC: mild pain  Pt states only mild pain. No nausea, vomiting, fever or chills. No abdominal pain. Nurse states his dressing was changed around 0530 this am. No new complaints.  Objective: Vital signs in last 24 hours: Temp:  [98 F (36.7 C)-98.7 F (37.1 C)] 98 F (36.7 C) (08/17 0550) Pulse Rate:  [57-69] 57 (08/17 0550) Resp:  [16-19] 16 (08/17 0550) BP: (113-132)/(80) 132/80 (08/17 0550) SpO2:  [95 %-100 %] 97 % (08/17 0550) Last BM Date: 03/26/17  Intake/Output from previous day: 08/16 0701 - 08/17 0700 In: 823.6 [P.O.:320; I.V.:403.6] Out: 1475 [Urine:1475] Intake/Output this shift: No intake/output data recorded.  PE: Gen:  Alert, NAD, pleasant, cooperative Card:  RRR, no M/G/R heard Pulm:  CTA anteriorly, no W/R/R, effort normal Abd: Soft, not distended, non tender, +BS Wound:  C/D/I   Anti-infectives: Anti-infectives    Start     Dose/Rate Route Frequency Ordered Stop   03/26/17 1700  cefTRIAXone (ROCEPHIN) 2 g in dextrose 5 % 50 mL IVPB     2 g 100 mL/hr over 30 Minutes Intravenous Every 24 hours 03/26/17 1638     03/26/17 1700  metroNIDAZOLE (FLAGYL) tablet 500 mg     500 mg Oral Every 8 hours 03/26/17 1638     03/25/17 1238  piperacillin-tazobactam (ZOSYN) 3.375 GM/50ML IVPB    Comments:  Key, Kristopher   : cabinet override      03/25/17 1238 03/25/17 1336   03/24/17 1000  emtricitabine-tenofovir AF (DESCOVY) 200-25 MG per tablet 1 tablet     1 tablet Oral Daily 03/23/17 2020     03/24/17 0200  vancomycin (VANCOCIN) IVPB 1000 mg/200 mL premix  Status:  Discontinued     1,000 mg 200 mL/hr over 60 Minutes Intravenous Every 12 hours 03/23/17 2110 03/26/17 1412   03/23/17 2100  dolutegravir (TIVICAY) tablet 50 mg     50 mg Oral Daily 03/23/17 2020     03/23/17 2030  piperacillin-tazobactam (ZOSYN) IVPB 3.375 g  Status:  Discontinued     3.375 g 12.5 mL/hr over 240 Minutes Intravenous Every 8 hours 03/23/17 2027 03/26/17 1638   03/23/17 1428  vancomycin (VANCOCIN) 1-5 GM/200ML-% IVPB    Comments:  Armistead, Lacey   : cabinet override      03/23/17 1428 03/23/17 1518   03/23/17 1215  piperacillin-tazobactam (ZOSYN) IVPB 3.375 g     3.375 g 100 mL/hr over  30 Minutes Intravenous  Once 03/23/17 1211 03/23/17 1353   03/23/17 1215  vancomycin (VANCOCIN) IVPB 1000 mg/200 mL premix     1,000 mg 200 mL/hr over 60 Minutes Intravenous  Once 03/23/17 1211 03/23/17 1518      Lab Results:   Recent Labs  03/25/17 0304  WBC 15.3*  HGB 10.4*  HCT 30.6*  PLT 387   BMET  Recent Labs  03/26/17 0246 03/27/17 0508  NA 133* 135  K 3.9 4.1  CL 100* 101  CO2 23 26  GLUCOSE 194* 108*  BUN 25* 22*  CREATININE 1.45*  1.45* 1.27*  CALCIUM 7.5* 7.6*   PT/INR No results for input(s): LABPROT, INR in the last 72 hours. CMP     Component Value Date/Time    NA 135 03/27/2017 0508   K 4.1 03/27/2017 0508   CL 101 03/27/2017 0508   CO2 26 03/27/2017 0508   GLUCOSE 108 (H) 03/27/2017 0508   BUN 22 (H) 03/27/2017 0508   CREATININE 1.27 (H) 03/27/2017 0508   CALCIUM 7.6 (L) 03/27/2017 0508   PROT 6.2 (L) 03/25/2017 0304   ALBUMIN 1.8 (L) 03/25/2017 0304   AST 18 03/25/2017 0304   ALT 14 (L) 03/25/2017 0304   ALKPHOS 67 03/25/2017 0304   BILITOT 0.6 03/25/2017 0304   GFRNONAA >60 03/27/2017 0508   GFRAA >60 03/27/2017 0508   Lipase  No results found for: LIPASE  Studies/Results: No results found.    Kalman Drape , [facility] Surgery 03/27/2017, 9:42 AM Pager: [phone] Consults: [phone] Mon-Fri 7:00 am-4:30 pm Sat-Sun 7:00 am-11:30 am

## 2017-03-28 LAB — BASIC METABOLIC PANEL
Anion gap: 6 (ref 5–15)
BUN: 23 mg/dL — AB (ref 6–20)
CHLORIDE: 104 mmol/L (ref 101–111)
CO2: 26 mmol/L (ref 22–32)
Calcium: 7.7 mg/dL — ABNORMAL LOW (ref 8.9–10.3)
Creatinine, Ser: 1.44 mg/dL — ABNORMAL HIGH (ref 0.61–1.24)
GFR calc Af Amer: >60 mL/min (ref >60)
GFR calc non Af Amer: 58 mL/min — ABNORMAL LOW (ref >60)
GLUCOSE: 165 mg/dL — AB (ref 65–99)
POTASSIUM: 3.9 mmol/L (ref 3.5–5.1)
SODIUM: 136 mmol/L (ref 135–145)

## 2017-03-28 LAB — GLUCOSE, CAPILLARY
GLUCOSE-CAPILLARY: 113 mg/dL — AB (ref 65–99)
GLUCOSE-CAPILLARY: 107 mg/dL — AB (ref 65–99)
GLUCOSE-CAPILLARY: 117 mg/dL — AB (ref 65–99)
Glucose-Capillary: 89 mg/dL (ref 65–99)

## 2017-03-28 LAB — VANCOMYCIN, TROUGH: Vancomycin Tr: 14 ug/mL — ABNORMAL LOW (ref 15–20)

## 2017-03-28 MED ORDER — INSULIN GLARGINE 100 UNIT/ML ~~LOC~~ SOLN
12.0000 [IU] | Freq: Two times a day (BID) | SUBCUTANEOUS | Status: DC
  Administered 2017-03-28 – 2017-04-01 (×8): 12 [IU] via SUBCUTANEOUS
  Filled 2017-03-28 (×9): qty 0.12

## 2017-03-28 NOTE — Progress Notes (Signed)
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 Excell Seltzer, 08/13 - S/P Irrigation and debridement, dressing change, and exploration of perineal wound, Dr. Excell Seltzer, 08/15 - S/P Debridement of Fournier's gangrene and cystoscopy and insertion of Foley catheter, Dr. Matilde Sprang, 08/15 - dressing change qshift, wet to dry. Continue.   FEN: carb mod VTE: SCD's, heparin ID: Zosyn & Vanc 08/13>>  MRSA, proteus mirabilis, strep anginosis Follow up: TBD  DISPO: qshift dressing changes, morphine IV for dressing changes. Pt will likely need HH. Will assess wound if dressing change is needed today after BM.   LOS: 5 days    Subjective:  CC: mild pain  Pain controlled.  Objective: Vital signs in last 24 hours: Temp:  [98.6 F (37 C)-99.2 F (37.3 C)] 98.9 F (37.2 C) (08/18 0642) Pulse Rate:  [58-67] 58 (08/18 0642) Resp:  [16-18] 18 (08/18 0642) BP: (126)/(70-81) 126/70 (08/18 0642) SpO2:  [96 %-97 %] 96 % (08/18 0642) Last BM Date: 03/28/17  Intake/Output from previous day: 08/17 0701 - 08/18 0700 In: 1420 [P.O.:960; I.V.:360; IV Piggyback:100] Out: 3001 [Urine:3000; Stool:1] Intake/Output this shift: No intake/output data recorded.  PE: Gen:  Alert, NAD, pleasant, cooperative Pulm:  Breathing comfortably Abd: Soft, not distended, non tender, +BS Wound: C/D/I   Anti-infectives: Anti-infectives    Start     Dose/Rate Route Frequency Ordered Stop   03/27/17 1800  vancomycin (VANCOCIN) IVPB 750 mg/150 ml premix     750 mg 150  mL/hr over 60 Minutes Intravenous Every 24 hours 03/27/17 0943     03/26/17 1700  cefTRIAXone (ROCEPHIN) 2 g in dextrose 5 % 50 mL IVPB     2 g 100 mL/hr over 30 Minutes Intravenous Every 24 hours 03/26/17 1638     03/26/17 1700  metroNIDAZOLE (FLAGYL) tablet 500 mg     500 mg Oral Every 8 hours 03/26/17 1638     03/25/17 1238  piperacillin-tazobactam (ZOSYN) 3.375 GM/50ML IVPB    Comments:  Key, Kristopher   : cabinet override      03/25/17 1238 03/25/17 1336   03/24/17 1000  emtricitabine-tenofovir AF (DESCOVY) 200-25 MG per tablet 1 tablet     1 tablet Oral Daily 03/23/17 2020     03/24/17 0200  vancomycin (VANCOCIN) IVPB 1000 mg/200 mL premix  Status:  Discontinued     1,000 mg 200 mL/hr over 60 Minutes Intravenous Every 12 hours 03/23/17 2110 03/26/17 1412   03/23/17 2100  dolutegravir (TIVICAY) tablet 50 mg     50 mg Oral Daily 03/23/17 2020     03/23/17 2030  piperacillin-tazobactam (ZOSYN) IVPB 3.375 g  Status:  Discontinued     3.375 g 12.5 mL/hr over 240 Minutes Intravenous Every 8 hours 03/23/17 2027 03/26/17 1638   03/23/17 1428  vancomycin (VANCOCIN) 1-5 GM/200ML-% IVPB    Comments:  Armistead, Lacey   : cabinet override      03/23/17 1428 03/23/17 1518   03/23/17 1215  piperacillin-tazobactam (ZOSYN) IVPB 3.375 g     3.375 g 100 mL/hr over 30 Minutes Intravenous  Once  03/23/17 1211 03/23/17 1353   03/23/17 1215  vancomycin (VANCOCIN) IVPB 1000 mg/200 mL premix     1,000 mg 200 mL/hr over 60 Minutes Intravenous  Once 03/23/17 1211 03/23/17 1518      Lab Results:  No results for input(s): WBC, HGB, HCT, PLT in the last 72 hours. BMET  Recent Labs  03/27/17 0508 03/28/17 0525  NA 135 136  K 4.1 3.9  CL 101 104  CO2 26 26  GLUCOSE 108* 165*  BUN 22* 23*  CREATININE 1.27* 1.44*  CALCIUM 7.6* 7.7*   PT/INR No results for input(s): LABPROT, INR in the last 72 hours. CMP     Component Value Date/Time   NA 136 03/28/2017 0525   K 3.9 03/28/2017 0525   CL  104 03/28/2017 0525   CO2 26 03/28/2017 0525   GLUCOSE 165 (H) 03/28/2017 0525   BUN 23 (H) 03/28/2017 0525   CREATININE 1.44 (H) 03/28/2017 0525   CALCIUM 7.7 (L) 03/28/2017 0525   PROT 6.2 (L) 03/25/2017 0304   ALBUMIN 1.8 (L) 03/25/2017 0304   AST 18 03/25/2017 0304   ALT 14 (L) 03/25/2017 0304   ALKPHOS 67 03/25/2017 0304   BILITOT 0.6 03/25/2017 0304   GFRNONAA 58 (L) 03/28/2017 0525   GFRAA >60 03/28/2017 0525   Lipase  No results found for: LIPASE  Studies/Results: No results found.    Stark Klein , [facility] 03/28/2017, 9:21 AM

## 2017-03-28 NOTE — Progress Notes (Signed)
3 Days Post-Op   Subjective/Chief Complaint:  1 - Perineal / Peri-Anal Necrotizing Fasciitis - s/p OR debridement 8/13 and 8/15. Clean based by OR exam 8/15. CX's MRSA + proteus and placed on Vanc + Rocephin. He is diabetic with HIV. Foley removed 8/18 as bedside exam favorable.   Today "Francisco Hutchinson" is stable. No fevers, pain controlled.    Objective: Vital signs in last 24 hours: Temp:  [98.6 F (37 C)-99.2 F (37.3 C)] 98.9 F (37.2 C) (08/18 0642) Pulse Rate:  [58-67] 58 (08/18 0642) Resp:  [16-18] 18 (08/18 0642) BP: (126)/(70-81) 126/70 (08/18 0642) SpO2:  [96 %-97 %] 96 % (08/18 0642) Last BM Date: 03/26/17  Intake/Output from previous day: 08/17 0701 - 08/18 0700 In: 1420 [P.O.:960; I.V.:360; IV Piggyback:100] Out: 3001 [Urine:3000; Stool:1] Intake/Output this shift: No intake/output data recorded.  General appearance: alert, cooperative and appears stated age Eyes: negative, wearing glasses Nose: Nares normal. Septum midline. Mucosa normal. No drainage or sinus tenderness. Throat: lips, mucosa, and tongue normal; teeth and gums normal Back: symmetric, no curvature. ROM normal. No CVA tenderness. Resp: non-labored on room air.  Cardio: Nl rate GI: soft, non-tender; bowel sounds normal; no masses,  no organomegaly Male genitalia: foley in place. Paritlaly buried penis due to fat pad. Left groin crase woudn wtih wet to dry dressing in place and clean based with expected mild fibinous exudate. No residual frank necrosis. No urethral exposure.  Extremities: extremities normal, atraumatic, no cyanosis or edema Neurologic: Grossly normal  Lab Results:  No results for input(s): WBC, HGB, HCT, PLT in the last 72 hours. BMET  Recent Labs  03/27/17 0508 03/28/17 0525  NA 135 136  K 4.1 3.9  CL 101 104  CO2 26 26  GLUCOSE 108* 165*  BUN 22* 23*  CREATININE 1.27* 1.44*  CALCIUM 7.6* 7.7*   PT/INR No results for input(s): LABPROT, INR in the last 72 hours. ABG No  results for input(s): PHART, HCO3 in the last 72 hours.  Invalid input(s): PCO2, PO2  Studies/Results: No results found.  Anti-infectives: Anti-infectives    Start     Dose/Rate Route Frequency Ordered Stop   03/27/17 1800  vancomycin (VANCOCIN) IVPB 750 mg/150 ml premix     750 mg 150 mL/hr over 60 Minutes Intravenous Every 24 hours 03/27/17 0943     03/26/17 1700  cefTRIAXone (ROCEPHIN) 2 g in dextrose 5 % 50 mL IVPB     2 g 100 mL/hr over 30 Minutes Intravenous Every 24 hours 03/26/17 1638     03/26/17 1700  metroNIDAZOLE (FLAGYL) tablet 500 mg     500 mg Oral Every 8 hours 03/26/17 1638     03/25/17 1238  piperacillin-tazobactam (ZOSYN) 3.375 GM/50ML IVPB    Comments:  Key, Kristopher   : cabinet override      03/25/17 1238 03/25/17 1336   03/24/17 1000  emtricitabine-tenofovir AF (DESCOVY) 200-25 MG per tablet 1 tablet     1 tablet Oral Daily 03/23/17 2020     03/24/17 0200  vancomycin (VANCOCIN) IVPB 1000 mg/200 mL premix  Status:  Discontinued     1,000 mg 200 mL/hr over 60 Minutes Intravenous Every 12 hours 03/23/17 2110 03/26/17 1412   03/23/17 2100  dolutegravir (TIVICAY) tablet 50 mg     50 mg Oral Daily 03/23/17 2020     03/23/17 2030  piperacillin-tazobactam (ZOSYN) IVPB 3.375 g  Status:  Discontinued     3.375 g 12.5 mL/hr over 240 Minutes Intravenous Every 8  hours 03/23/17 2027 03/26/17 1638   03/23/17 1428  vancomycin (VANCOCIN) 1-5 GM/200ML-% IVPB    Comments:  Armistead, Lacey   : cabinet override      03/23/17 1428 03/23/17 1518   03/23/17 1215  piperacillin-tazobactam (ZOSYN) IVPB 3.375 g     3.375 g 100 mL/hr over 30 Minutes Intravenous  Once 03/23/17 1211 03/23/17 1353   03/23/17 1215  vancomycin (VANCOCIN) IVPB 1000 mg/200 mL premix     1,000 mg 200 mL/hr over 60 Minutes Intravenous  Once 03/23/17 1211 03/23/17 1518      Assessment/Plan:  1 - Perineal / Peri-Anal Necrotizing Fasciitis - no further surgical indications from GU perspective. This will  likely slowly heal with wet to dry gauze dressing, changed ideally at least daily. He may require home health for this.   Foley removed today.   [facility], Byanca Kasper 03/28/2017

## 2017-03-28 NOTE — Progress Notes (Signed)
Pharmacy Antibiotic Note  Francisco Hutchinson is a 46 y.o. male admitted on 03/23/2017 with multiple chronic medical conditions including HIV and history of lymphoma.  Presents with copious foul-smelling drainage and erythema and edema involving the left buttock.   During debridement it was discovered to be perirectal and perineal necrotizing fasciitis that involved the periurethral area around the bulb.  Pharmacy has been consulted for vancomycin dosing for cellulitis.  Today, 03/28/2017:  Vanc level drawn 24 hr after previous dose = 14  Plan:  Continue Vancomycin 750mg  IV q24h - VT slightly low but SCr increased.  Consider increasing vancomycin to 1000 mg IV q24 tomorrow if SCr improved or stable (assuming VT 15-20 still desired)  Continue Ceftriaxone 2gm IV q24h (per ID)  Daily Scr  Follow cultures, renal function and clinical course, ID recommendations   Height: 5\' 11"  (180.3 cm) Weight: 226 lb 6.6 oz (102.7 kg) IBW/kg (Calculated) : 75.3  Temp (24hrs), Avg:98.8 F (37.1 C), Min:98.6 F (37 C), Max:98.9 F (37.2 C)   Recent Labs Lab 03/23/17 1343  03/23/17 1347 03/24/17 0319 03/25/17 0304 03/26/17 0246 03/26/17 1328 03/27/17 0508 03/28/17 0525 03/28/17 1911  WBC  --   --  29.1* 26.3* 15.3*  --   --   --   --   --   CREATININE  --   < > 1.45* 1.47* 1.54* 1.45*  1.45*  --  1.27* 1.44*  --   LATICACIDVEN 1.38  --   --   --   --   --   --   --   --   --   VANCOTROUGH  --   --   --   --   --   --  33*  --   --  14*  VANCORANDOM  --   --   --   --   --   --   --  20  --   --   < > = values in this interval not displayed.  Estimated Creatinine Clearance: 79.9 mL/min (A) (by C-G formula based on SCr of 1.44 mg/dL (H)).    No Known Allergies  Antimicrobials this admission:  8/13 Vanc >> 8/13 Zosyn >> 8/16 8/16 CTX>> 8/16 flagyl  HIV regimen per [facility]: Descovy + Tivicay - well controlled  Dose adjustments this admission:  8/16 VT = 33 on 1g q12h  8/17  RV= 20, ~ 24 hours from last dose 8/18 VT at 19:00 = 14 ~24hr from last dose  Microbiology results:  8/13 L buttock abscess: MRSA, Proteus mirabilis, Strep anginosis 8/13 MRSA PCR: 8/13  Thank you for allowing pharmacy to be a part of this patient's care.  Reuel Boom, PharmD, BCPS Pager: [phone] 03/28/2017, 8:41 PM

## 2017-03-29 LAB — GLUCOSE, CAPILLARY
GLUCOSE-CAPILLARY: 88 mg/dL (ref 65–99)
GLUCOSE-CAPILLARY: 159 mg/dL — AB (ref 65–99)
GLUCOSE-CAPILLARY: 154 mg/dL — AB (ref 65–99)
Glucose-Capillary: 141 mg/dL — ABNORMAL HIGH (ref 65–99)

## 2017-03-29 LAB — CREATININE, SERUM
Creatinine, Ser: 1.24 mg/dL (ref 0.61–1.24)
GFR calc Af Amer: >60 mL/min (ref >60)
GFR calc non Af Amer: >60 mL/min (ref >60)

## 2017-03-29 MED ORDER — SODIUM CHLORIDE 0.9% FLUSH
10.0000 mL | INTRAVENOUS | Status: DC | PRN
  Administered 2017-03-30 – 2017-03-31 (×2): 10 mL
  Filled 2017-03-29 (×2): qty 40

## 2017-03-29 NOTE — Progress Notes (Signed)
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 Excell Seltzer, 08/13 - S/P Irrigation and debridement, dressing change, and exploration of perineal wound, Dr. Excell Seltzer, 08/15 - S/P Debridement of Fournier's gangrene and cystoscopy and insertion of Foley catheter, Dr. Matilde Sprang, 08/15 - dressing change daily and PRN., wet to dry. Continue.   FEN: carb mod VTE: SCD's, heparin ID: Zosyn & Vanc 08/13>>  MRSA, proteus mirabilis, strep anginosis Follow up: TBD  DISPO: daily dressing changes and PRN. Tentatively home tomorrow.     LOS: 6 days    Subjective:  CC: mild pain  Pain controlled.  Objective: Vital signs in last 24 hours: Temp:  [97.9 F (36.6 C)-98.6 F (37 C)] 97.9 F (36.6 C) (08/19 0518) Pulse Rate:  [57-72] 57 (08/19 0518) Resp:  [16-18] 16 (08/19 0518) BP: (132-136)/(82-86) 132/82 (08/19 0518) SpO2:  [97 %-99 %] 99 % (08/19 0518) Last BM Date: 03/28/17  Intake/Output from previous day: 08/18 0701 - 08/19 0700 In: 540 [I.V.:240; IV Piggyback:300] Out: 225 [Urine:225] Intake/Output this shift: No intake/output data recorded.  PE: Gen:  Alert, NAD, pleasant, cooperative Pulm:  Breathing comfortably Abd: Soft, not distended, non tender, +BS Wound: C/D/I   Anti-infectives: Anti-infectives    Start     Dose/Rate Route Frequency Ordered Stop   03/27/17 1800  vancomycin (VANCOCIN) IVPB 750 mg/150 ml premix     750 mg 150 mL/hr over 60 Minutes Intravenous Every 24 hours 03/27/17 0943     03/26/17 1700  cefTRIAXone  (ROCEPHIN) 2 g in dextrose 5 % 50 mL IVPB     2 g 100 mL/hr over 30 Minutes Intravenous Every 24 hours 03/26/17 1638     03/26/17 1700  metroNIDAZOLE (FLAGYL) tablet 500 mg     500 mg Oral Every 8 hours 03/26/17 1638     03/25/17 1238  piperacillin-tazobactam (ZOSYN) 3.375 GM/50ML IVPB    Comments:  Key, Kristopher   : cabinet override      03/25/17 1238 03/25/17 1336   03/24/17 1000  emtricitabine-tenofovir AF (DESCOVY) 200-25 MG per tablet 1 tablet     1 tablet Oral Daily 03/23/17 2020     03/24/17 0200  vancomycin (VANCOCIN) IVPB 1000 mg/200 mL premix  Status:  Discontinued     1,000 mg 200 mL/hr over 60 Minutes Intravenous Every 12 hours 03/23/17 2110 03/26/17 1412   03/23/17 2100  dolutegravir (TIVICAY) tablet 50 mg     50 mg Oral Daily 03/23/17 2020     03/23/17 2030  piperacillin-tazobactam (ZOSYN) IVPB 3.375 g  Status:  Discontinued     3.375 g 12.5 mL/hr over 240 Minutes Intravenous Every 8 hours 03/23/17 2027 03/26/17 1638   03/23/17 1428  vancomycin (VANCOCIN) 1-5 GM/200ML-% IVPB    Comments:  Armistead, Lacey   : cabinet override      03/23/17 1428 03/23/17 1518   03/23/17 1215  piperacillin-tazobactam (ZOSYN) IVPB 3.375 g     3.375 g 100 mL/hr over 30 Minutes Intravenous  Once 03/23/17 1211 03/23/17 1353   03/23/17 1215  vancomycin (VANCOCIN) IVPB 1000 mg/200  mL premix     1,000 mg 200 mL/hr over 60 Minutes Intravenous  Once 03/23/17 1211 03/23/17 1518      Lab Results:  No results for input(s): WBC, HGB, HCT, PLT in the last 72 hours. BMET  Recent Labs  03/27/17 0508 03/28/17 0525 03/29/17 0516  NA 135 136  --   K 4.1 3.9  --   CL 101 104  --   CO2 26 26  --   GLUCOSE 108* 165*  --   BUN 22* 23*  --   CREATININE 1.27* 1.44* 1.24  CALCIUM 7.6* 7.7*  --    PT/INR No results for input(s): LABPROT, INR in the last 72 hours. CMP     Component Value Date/Time   NA 136 03/28/2017 0525   K 3.9 03/28/2017 0525   CL 104 03/28/2017 0525   CO2 26 03/28/2017  0525   GLUCOSE 165 (H) 03/28/2017 0525   BUN 23 (H) 03/28/2017 0525   CREATININE 1.24 03/29/2017 0516   CALCIUM 7.7 (L) 03/28/2017 0525   PROT 6.2 (L) 03/25/2017 0304   ALBUMIN 1.8 (L) 03/25/2017 0304   AST 18 03/25/2017 0304   ALT 14 (L) 03/25/2017 0304   ALKPHOS 67 03/25/2017 0304   BILITOT 0.6 03/25/2017 0304   GFRNONAA >60 03/29/2017 0516   GFRAA >60 03/29/2017 0516   Lipase  No results found for: LIPASE  Studies/Results: No results found.    Stark Klein , [facility] 03/29/2017, 10:01 AM

## 2017-03-29 NOTE — Progress Notes (Signed)
[facility] CONSULT PROGRESS NOTE    Progress Note  Francisco Hutchinson  OEV:[id number] DOB: 06-29-1971 DOA: 03/23/2017 PCP: Marco Collie, MD     Brief Narrative:   Francisco Hutchinson is an 46 y.o. male past medical history of diabetes mellitus, HIV previous stroke admitted on 12/21/2016 for left buttock pain and an abscess which was draining at home. General surgery and urology were consulted and was taken to the OR and underwent incision and debridement with extensive facia and soft tissue debridement.  Assessment/Plan:   Abscess of buttock, left/ Perineal Necrotizing fasciitis ([place]): Second I&D on 8.15 2018. ID is on board.  Diabetes mellitus type 2 with chronic renal disease: Continue long-acting insulin plus sliding scale. A1c was 10.9 on admission. Excellent control blood glucose. We will sign off.  History of CVA's: With 2 watershed CVAs.  Chronic renal disease stage III: Currently stage III  seems to be his baseline.  Deconditioning: Physical therapy evaluation is pending.  HIV: Continue current home meds. CD4 count 310 HIV RNA 50, ID on board.  DVT prophylaxis: Lovenox Family Communication:none Disposition Plan/Barrier to D/C: per surgery Code Status:     Code Status Orders        Start     Ordered   03/23/17 2021  Full code  Continuous     03/23/17 2020    Code Status History    Date Active Date Inactive Code Status Order ID Comments User Context   This patient has a current code status but no historical code status.        IV Access:    Peripheral IV   Procedures and diagnostic studies:   No results found.   Medical Consultants:    None.  Anti-Infectives:   IV vancomycin and Zosyn.   Subjective:    Francisco Hutchinson new complaints.  Objective:    Vitals:   03/27/17 2246 03/28/17 0642 03/28/17 2217 03/29/17 0518  BP: 126/76 126/70 136/86 132/82  Pulse: 67 (!) 58 72 (!) 57  Resp: 18 18 18 16   Temp: 98.6  F (37 C) 98.9 F (37.2 C) 98.6 F (37 C) 97.9 F (36.6 C)  TempSrc: Oral Oral Oral Oral  SpO2: 97% 96% 97% 99%  Weight:      Height:        Intake/Output Summary (Last 24 hours) at 03/29/17 0910 Last data filed at 03/29/17 0600  Gross per 24 hour  Intake              540 ml  Output              225 ml  Net              315 ml   Filed Weights   03/23/17 2020  Weight: 102.7 kg (226 lb 6.6 oz)    Exam: General exam:In no acute distress Respiratory system: Good air movement and clear to auscultation. Cardiovascular system: Regular rate and rhythm, positive S1-S2. Gastrointestinal system: Abdomen is soft nontender nondistended Central nervous system: Alert and oriented. No focal neurological deficits. Extremities: No lower extremity edema. Skin: Gauzes in the perineum and buttock area. Psychiatry: Judgement and insight appear normal. Mood & affect appropriate.    Data Reviewed:    Labs: Basic Metabolic Panel:  Recent Labs Lab 03/24/17 0319 03/25/17 0304 03/26/17 0246 03/27/17 0508 03/28/17 0525 03/29/17 0516  NA 131* 133* 133* 135 136  --   K 3.4* 3.7 3.9 4.1 3.9  --  CL 95* 100* 100* 101 104  --   CO2 27 27 23 26 26   --   GLUCOSE 220* 195* 194* 108* 165*  --   BUN 28* 31* 25* 22* 23*  --   CREATININE 1.47* 1.54* 1.45*  1.45* 1.27* 1.44* 1.24  CALCIUM 7.6* 7.3* 7.5* 7.6* 7.7*  --    GFR Estimated Creatinine Clearance: 92.8 mL/min (by C-G formula based on SCr of 1.24 mg/dL). Liver Function Tests:  Recent Labs Lab 03/23/17 1347 03/25/17 0304  AST 22 18  ALT 20 14*  ALKPHOS 99 67  BILITOT 0.8 0.6  PROT 7.4 6.2*  ALBUMIN 2.1* 1.8*   No results for input(s): LIPASE, AMYLASE in the last 168 hours. No results for input(s): AMMONIA in the last 168 hours. Coagulation profile No results for input(s): INR, PROTIME in the last 168 hours.  CBC:  Recent Labs Lab 03/23/17 1347 03/24/17 0319 03/25/17 0304  WBC 29.1* 26.3* 15.3*  NEUTROABS 26.4*  --    --   HGB 12.4* 11.6* 10.4*  HCT 36.1* 34.3* 30.6*  MCV 79.3 79.4 79.1  PLT 436* 386 387   Cardiac Enzymes: No results for input(s): CKTOTAL, CKMB, CKMBINDEX, TROPONINI in the last 168 hours. BNP (last 3 results) No results for input(s): PROBNP in the last 8760 hours. CBG:  Recent Labs Lab 03/28/17 0743 03/28/17 1211 03/28/17 1647 03/28/17 2212 03/29/17 0722  GLUCAP 113* 89 107* 117* 88   D-Dimer: No results for input(s): DDIMER in the last 72 hours. Hgb A1c: No results for input(s): HGBA1C in the last 72 hours. Lipid Profile: No results for input(s): CHOL, HDL, LDLCALC, TRIG, CHOLHDL, LDLDIRECT in the last 72 hours. Thyroid function studies: No results for input(s): TSH, T4TOTAL, T3FREE, THYROIDAB in the last 72 hours.  Invalid input(s): FREET3 Anemia work up: No results for input(s): VITAMINB12, FOLATE, FERRITIN, TIBC, IRON, RETICCTPCT in the last 72 hours. Sepsis Labs:  Recent Labs Lab 03/23/17 1343 03/23/17 1347 03/24/17 0319 03/25/17 0304  WBC  --  29.1* 26.3* 15.3*  LATICACIDVEN 1.38  --   --   --    Microbiology Recent Results (from the past 240 hour(s))  Aerobic/Anaerobic Culture (surgical/deep wound)     Status: None (Preliminary result)   Collection Time: 03/23/17  3:44 PM  Result Value Ref Range Status   Specimen Description ABSCESS LEFT BUTTOCKS  Final   Special Requests NONE  Final   Gram Stain   Final    ABUNDANT WBC PRESENT, PREDOMINANTLY PMN FEW GRAM NEGATIVE RODS MODERATE GRAM POSITIVE COCCI IN PAIRS    Culture   Final    RARE METHICILLIN RESISTANT STAPHYLOCOCCUS AUREUS RARE PROTEUS MIRABILIS MODERATE VIRIDANS STREPTOCOCCUS HOLDING FOR POSSIBLE ANAEROBE Performed at [facility], [place] [address] [place], [place] [address]    Report Status PENDING  Incomplete   Organism ID, Bacteria METHICILLIN RESISTANT STAPHYLOCOCCUS AUREUS  Final   Organism ID, Bacteria PROTEUS MIRABILIS  Final   Organism ID, Bacteria VIRIDANS STREPTOCOCCUS   Final      Susceptibility   Methicillin resistant staphylococcus aureus - MIC*    CIPROFLOXACIN <=0.5 SENSITIVE Sensitive     ERYTHROMYCIN >=8 RESISTANT Resistant     GENTAMICIN <=0.5 SENSITIVE Sensitive     OXACILLIN >=4 RESISTANT Resistant     TETRACYCLINE <=1 SENSITIVE Sensitive     VANCOMYCIN <=0.5 SENSITIVE Sensitive     TRIMETH/SULFA <=10 SENSITIVE Sensitive     CLINDAMYCIN <=0.25 SENSITIVE Sensitive     RIFAMPIN <=0.5 SENSITIVE Sensitive  Inducible Clindamycin NEGATIVE Sensitive     * RARE METHICILLIN RESISTANT STAPHYLOCOCCUS AUREUS   Proteus mirabilis - MIC*    AMPICILLIN >=32 RESISTANT Resistant     CEFAZOLIN <=4 SENSITIVE Sensitive     CEFEPIME <=1 SENSITIVE Sensitive     CEFTAZIDIME <=1 SENSITIVE Sensitive     CEFTRIAXONE <=1 SENSITIVE Sensitive     CIPROFLOXACIN 2 INTERMEDIATE Intermediate     GENTAMICIN <=1 SENSITIVE Sensitive     IMIPENEM 2 SENSITIVE Sensitive     TRIMETH/SULFA >=320 RESISTANT Resistant     AMPICILLIN/SULBACTAM 8 SENSITIVE Sensitive     PIP/TAZO <=4 SENSITIVE Sensitive     * RARE PROTEUS MIRABILIS   Viridans streptococcus - MIC*    PENICILLIN <=0.06 SENSITIVE Sensitive     CEFTRIAXONE 0.25 SENSITIVE Sensitive     ERYTHROMYCIN >=8 RESISTANT Resistant     LEVOFLOXACIN 0.5 SENSITIVE Sensitive     VANCOMYCIN 0.5 SENSITIVE Sensitive     * MODERATE VIRIDANS STREPTOCOCCUS  MRSA PCR Screening     Status: Abnormal   Collection Time: 03/23/17  9:00 PM  Result Value Ref Range Status   MRSA by PCR POSITIVE (A) NEGATIVE Final    Comment:        The GeneXpert MRSA Assay (FDA approved for NASAL specimens only), is one component of a comprehensive MRSA colonization surveillance program. It is not intended to diagnose MRSA infection nor to guide or monitor treatment for MRSA infections. RESULT CALLED TO, READ BACK BY AND VERIFIED WITH: KOTIAN,S RN 8.13.18 @2311  ZANDO,C      Medications:   . amLODipine  5 mg Oral Daily  . dolutegravir  50 mg  Oral Daily  . emtricitabine-tenofovir AF  1 tablet Oral Daily  . fenofibrate  160 mg Oral Daily  . heparin  5,000 Units Subcutaneous Q8H  . hydrochlorothiazide  25 mg Oral Daily  . insulin aspart  0-15 Units Subcutaneous TID WC  . insulin aspart  0-5 Units Subcutaneous QHS  . insulin glargine  12 Units Subcutaneous BID  . mouth rinse  15 mL Mouth Rinse BID  . metoprolol tartrate  100 mg Oral Daily  . metroNIDAZOLE  500 mg Oral Q8H  . sertraline  50 mg Oral Daily   Continuous Infusions: . sodium chloride 10 mL/hr at 03/27/17 0954  . cefTRIAXone (ROCEPHIN)  IV 2 g (03/28/17 1710)  . vancomycin Stopped (03/28/17 2231)     LOS: 6 days   Charlynne Cousins  [facility] Pager [phone]  *Please refer to [email or web address], password [email or web address] to get updated schedule on who will round on this patient, as hospitalists switch teams weekly. If 7PM-7AM, please contact night-coverage at [email or web address], password [email or web address] for any overnight needs.  03/29/2017, 9:10 AM

## 2017-03-29 NOTE — Progress Notes (Signed)
Peripherally Inserted Central Catheter/Midline Placement  The IV Nurse has discussed with the patient and/or persons authorized to consent for the patient, the purpose of this procedure and the potential benefits and risks involved with this procedure.  The benefits include less needle sticks, lab draws from the catheter, and the patient may be discharged home with the catheter. Risks include, but not limited to, infection, bleeding, blood clot (thrombus formation), and puncture of an artery; nerve damage and irregular heartbeat and possibility to perform a PICC exchange if needed/ordered by physician.  Alternatives to this procedure were also discussed.  Bard Power PICC patient education guide, fact sheet on infection prevention and patient information card has been provided to patient /or left at bedside.    PICC/Midline Placement Documentation        Darlyn Read 03/29/2017, 11:45 AM

## 2017-03-30 LAB — CREATININE, SERUM
Creatinine, Ser: 1.2 mg/dL (ref 0.61–1.24)
GFR calc non Af Amer: >60 mL/min (ref >60)

## 2017-03-30 LAB — VANCOMYCIN, TROUGH: Vancomycin Tr: 12 ug/mL — ABNORMAL LOW (ref 15–20)

## 2017-03-30 LAB — AEROBIC/ANAEROBIC CULTURE (SURGICAL/DEEP WOUND)

## 2017-03-30 LAB — AEROBIC/ANAEROBIC CULTURE W GRAM STAIN (SURGICAL/DEEP WOUND)

## 2017-03-30 LAB — GLUCOSE, CAPILLARY
GLUCOSE-CAPILLARY: 136 mg/dL — AB (ref 65–99)
Glucose-Capillary: 91 mg/dL (ref 65–99)
Glucose-Capillary: 114 mg/dL — ABNORMAL HIGH (ref 65–99)
Glucose-Capillary: 191 mg/dL — ABNORMAL HIGH (ref 65–99)

## 2017-03-30 MED ORDER — HYDROCERIN EX CREA
TOPICAL_CREAM | Freq: Two times a day (BID) | CUTANEOUS | Status: DC
  Administered 2017-03-30 – 2017-04-01 (×5): via TOPICAL
  Filled 2017-03-30: qty 113

## 2017-03-30 MED ORDER — VANCOMYCIN HCL IN DEXTROSE 1-5 GM/200ML-% IV SOLN
1000.0000 mg | INTRAVENOUS | Status: DC
  Administered 2017-03-30 – 2017-03-31 (×2): 1000 mg via INTRAVENOUS
  Filled 2017-03-30 (×3): qty 200

## 2017-03-30 NOTE — Clinical Social Work Note (Signed)
Clinical Social Work Assessment  Patient Details  Name: Francisco Hutchinson MRN: [id number] Date of Birth: [birth year]  Date of referral:  03/30/17               Reason for consult:  Discharge Planning, Facility Placement                Permission sought to share information with:  Facility Art therapist granted to share information::  Yes, Verbal Permission Granted  Name::        Agency::     Relationship::     Contact Information:     Housing/Transportation Living arrangements for the past 2 months:  Single Family Home Source of Information:  Parent, Partner Patient Interpreter Needed:  None Criminal Activity/Legal Involvement Pertinent to Current Situation/Hospitalization:  No - Comment as needed Significant Relationships:  Parents, Soil scientist Lives with:  Domestic Partner Do you feel safe going back to the place where you live?   (SNF needed.) Need for family participation in patient care:  Yes (Comment)  Care giving concerns:  Family is unable to manage pt's dressing changes at home.   Social Worker assessment / plan:  Pt hospitalized from home on 03/23/17 with a left buttock abscess. CSW consulted to assist with SNF placement for IV antibiotics and daily / PRN  dressing changes. CSW met with pt / family at bedside to assist with dc planning. Pt / family are in agreement with SNF placement. SNF search has been initiated and bed offers pending. Pt has [employer] which can take up to 48 hrs or more for prior approval for SNF placement. Pt / family are aware and report that they are unable to pay out of pocket for placement while waiting for authorization. CSW will continue to follow to assist with dc planning needs.  Employment status:  Disabled (Comment on whether or not currently receiving Disability) Insurance information:  Managed Medicare PT Recommendations:  Home with [place] / Referral to community resources:  [place]  Patient/Family's Response to care:  Pt / family are in agreement with SNF placement.  Patient/Family's Understanding of and Emotional Response to Diagnosis, Current Treatment, and Prognosis:  Pt / family are aware of pt's medical status and dc needs. Family reports SNF placement is needed. Pt was hoping to go home with [facility] services and family support but has accepted need for placement.  Emotional Assessment Appearance:  Appears stated age Attitude/Demeanor/Rapport:  Other (cooperative) Affect (typically observed):  Appropriate, Calm Orientation:  Oriented to Self, Oriented to Place, Oriented to  Time, Oriented to Situation Alcohol / Substance use:  Not Applicable Psych involvement (Current and /or in the community):  No (Comment)  Discharge Needs  Concerns to be addressed:  Discharge Planning Concerns Readmission within the last 30 days:  No Current discharge risk:  None Barriers to Discharge:  Insurance Authorization   Loraine Maple  [phone] 03/30/2017, 2:09 PM

## 2017-03-30 NOTE — Anesthesia Postprocedure Evaluation (Signed)
Anesthesia Post Note  Patient: Francisco Hutchinson  Procedure(s) Performed: Procedure(s) (LRB): IRRIGATION AND DEBRIDEMENT DRESSING CHANGE AND EXPLORATION OF PERINEAL WOUND; CYSTOSCOPY (N/A)     Anesthesia Type: General    Last Vitals:  Vitals:   03/29/17 2121 03/30/17 0443  BP: 138/83 (!) 175/87  Pulse: (!) 57 (!) 53  Resp: 18 18  Temp: 37.3 C 36.7 C  SpO2: 99% 100%    Last Pain:  Vitals:   03/30/17 0823  TempSrc:   PainSc: 4                  Teri Legacy,JAMES TERRILL

## 2017-03-30 NOTE — Progress Notes (Signed)
PHARMACY CONSULT NOTE FOR:  OUTPATIENT  PARENTERAL ANTIBIOTIC THERAPY (OPAT)  Indication: necrotizing fasciitis Regimen: Vancomycin and Rocephin End date: 04/20/17  IV antibiotic discharge orders are pended. To discharging provider:  please sign these orders via discharge navigator,  Select New Orders & click on the button choice - Manage This Unsigned Work.     Thank you for allowing pharmacy to be a part of this patient's care.  Kara Mead 03/30/2017, 10:56 AM

## 2017-03-30 NOTE — Progress Notes (Signed)
PT Cancellation Note  Patient Details Name: Francisco Hutchinson MRN: [id number] DOB: [birth year]   Cancelled Treatment:     at first pt agreeable to get OOB to amb.  Left room to gather supplies (back gown, extra sheet and wash cloths).  Partially assisted pt to EOB when he stated "No, I'm not going".  Attempted redirection failed.  Pt stated his stomach just started to hurt and he "not able to walk right now".  Reported to RN.    Rica Koyanagi  PTA [facility]  Acute  Rehab Pager      [phone]

## 2017-03-30 NOTE — Progress Notes (Signed)
Patient incontinent of several loose stools,and urine.Dressing changed  When soiled.

## 2017-03-30 NOTE — Progress Notes (Signed)
Spoke with patient this am at bedside. He states he has an aunt that will come to learn wound care. He wishes to d/c home with [facility], thinks he has used [facility] in the past. Patient is w/c bound mostly but is able to ambulate with a walker. Later in the day I was called back to the patient's room to speak with his friend [name], who states that patient will need SNF at d/c because they are unable to handle his wounds as previously thought. Patient is in agreement and understands rationale. Contacted CSW to begin SNF search. She will f/u with bed offers when available.

## 2017-03-30 NOTE — Progress Notes (Signed)
PHARMACY CONSULT NOTE FOR:  OUTPATIENT  PARENTERAL ANTIBIOTIC THERAPY (OPAT)  Indication: Necrotizing fasciitis Regimen: Vancomycin 1g IV q24h, Ceftriaxone 2g IV q24h End date: 04/20/2017  IV antibiotic discharge orders are pended. To discharging provider:  please sign these orders via discharge navigator,  Select New Orders & click on the button choice - Manage This Unsigned Work.    Please note, ID also recommends oral metronidazole 500mg  PO q8h for anaerobic coverage.   Thank you for allowing pharmacy to be a part of this patient's care.  Lindell Spar M 03/30/2017, 10:06 PM

## 2017-03-30 NOTE — NC FL2 (Signed)
[place] LEVEL OF CARE SCREENING TOOL     IDENTIFICATION  Patient Name: Francisco Hutchinson Birthdate: April 18, 1971 Sex: male Admission Date (Current Location): 03/23/2017  [facility] and [place] Number:  Herbalist and Address:  [facility],  [place] [address], [place]      Provider Number: [id number]  Attending Physician Name and Address:  Edison Pace, Md, MD  Relative Name and Phone Number:       Current Level of Care: Hospital Recommended Level of Care: [place] Prior Approval Number:    Date Approved/Denied:   PASRR Number:   [id number]  Discharge Plan: SNF    Current Diagnoses: Patient Active Problem List   Diagnosis Date Noted  . CKD (chronic kidney disease), stage III 03/25/2017  . Secondary diabetes mellitus with stage 3 chronic kidney disease ([place]) 03/25/2017  . Pressure injury of skin 03/25/2017  . Abscess of buttock, left 03/23/2017  . Status post incision and drainage 03/23/2017  . Necrotizing fasciitis ([place]) 03/23/2017  . Renal insufficiency 03/23/2017    Orientation RESPIRATION BLADDER Height & Weight     Self, Time, Situation, Place  Normal Continent Weight: 102.7 kg (226 lb 6.6 oz) Height:  5\' 11"  (180.3 cm)  BEHAVIORAL SYMPTOMS/MOOD NEUROLOGICAL BOWEL NUTRITION STATUS  Other (Comment) (no behaviors)   Incontinent Diet  AMBULATORY STATUS COMMUNICATION OF NEEDS Skin   Limited Assist Verbally Surgical wounds (Stage 2 on elbow. Wet to Dry Dressing changes  daily / PRN  - buttocks abscess)                       Personal Care Assistance Level of Assistance  Bathing, Feeding, Dressing Bathing Assistance: Maximum assistance Feeding assistance: Independent Dressing Assistance: Limited assistance     Functional Limitations Info  Sight, Hearing, Speech Sight Info: Adequate Hearing Info: Adequate Speech Info: Adequate    SPECIAL CARE FACTORS FREQUENCY  PT (By licensed PT), OT (By licensed  OT)     PT Frequency: 5x wk OT Frequency: 5x wk            Contractures Contractures Info: Not present    Additional Factors Info  Code Status Code Status Info: Full Code             Current Medications (03/30/2017):  This is the current hospital active medication list Current Facility-Administered Medications  Medication Dose Route Frequency Provider Last Rate Last Dose  . 0.9 %  sodium chloride infusion   Intravenous Continuous Charlynne Cousins, MD 10 mL/hr at 03/29/17 1800    . acetaminophen (TYLENOL) tablet 650 mg  650 mg Oral Q6H PRN Merton Border, MD       Or  . acetaminophen (TYLENOL) suppository 650 mg  650 mg Rectal Q6H PRN Merton Border, MD      . amLODipine (NORVASC) tablet 5 mg  5 mg Oral Daily Merton Border, MD   5 mg at 03/30/17 1002  . cefTRIAXone (ROCEPHIN) 2 g in dextrose 5 % 50 mL IVPB  2 g Intravenous Q24H Carlyle Basques, MD 100 mL/hr at 03/29/17 1800 2 g at 03/29/17 1800  . dolutegravir (TIVICAY) tablet 50 mg  50 mg Oral Daily Merton Border, MD   50 mg at 03/30/17 1002  . emtricitabine-tenofovir AF (DESCOVY) 200-25 MG per tablet 1 tablet  1 tablet Oral Daily Merton Border, MD   1 tablet at 03/30/17 1002  . fenofibrate tablet 160 mg  160 mg Oral Daily Hijazi,  Deatra Canter, MD   160 mg at 03/30/17 1002  . heparin injection 5,000 Units  5,000 Units Subcutaneous Q8H Merton Border, MD   5,000 Units at 03/30/17 0533  . hydrocerin (EUCERIN) cream   Topical BID Focht, Jessica L, PA      . hydrochlorothiazide (HYDRODIURIL) tablet 25 mg  25 mg Oral Daily Merton Border, MD   25 mg at 03/30/17 1002  . HYDROcodone-acetaminophen (NORCO/VICODIN) 5-325 MG per tablet 1-2 tablet  1-2 tablet Oral Q4H PRN Merton Border, MD      . insulin aspart (novoLOG) injection 0-15 Units  0-15 Units Subcutaneous TID WC Merton Border, MD   2 Units at 03/30/17 1300  . insulin aspart (novoLOG) injection 0-5 Units  0-5 Units Subcutaneous QHS Merton Border, MD   3 Units at 03/27/17 2322  . insulin glargine (LANTUS)  injection 12 Units  12 Units Subcutaneous BID Charlynne Cousins, MD   12 Units at 03/30/17 1004  . MEDLINE mouth rinse  15 mL Mouth Rinse BID Excell Seltzer, MD   15 mL at 03/30/17 1004  . metoprolol tartrate (LOPRESSOR) tablet 100 mg  100 mg Oral Daily Merton Border, MD   100 mg at 03/30/17 1003  . metroNIDAZOLE (FLAGYL) tablet 500 mg  500 mg Oral Q8H Carlyle Basques, MD   500 mg at 03/30/17 1003  . morphine 2 MG/ML injection 4 mg  4 mg Intravenous Q2H PRN Focht, Jessica L, PA   4 mg at 03/30/17 0823  . ondansetron (ZOFRAN) tablet 4 mg  4 mg Oral Q6H PRN Merton Border, MD       Or  . ondansetron (ZOFRAN) injection 4 mg  4 mg Intravenous Q6H PRN Merton Border, MD   4 mg at 03/25/17 1335  . sertraline (ZOLOFT) tablet 50 mg  50 mg Oral Daily Merton Border, MD   50 mg at 03/30/17 1003  . sodium chloride flush (NS) 0.9 % injection 10-40 mL  10-40 mL Intracatheter PRN Irine Seal, MD      . vancomycin (VANCOCIN) IVPB 750 mg/150 ml premix  750 mg Intravenous Q24H Angela Adam, [facility]   Stopped at 03/29/17 2200  . zolpidem (AMBIEN) tablet 5 mg  5 mg Oral QHS PRN,MR X 1 Merton Border, MD         Discharge Medications: Please see discharge summary for a list of discharge medications.  Relevant Imaging Results:  Relevant Lab Results:   Additional Information SS # [id number]. IV antibiotices ( vanc & rocephin ) until 04/20/17.  Beatrice Ziehm, Randall An, LCSW

## 2017-03-30 NOTE — Progress Notes (Signed)
Pharmacy Antibiotic Note  Francisco Hutchinson is a 46 y.o. male admitted on 03/23/2017 with multiple chronic medical conditions including HIV and history of lymphoma.  Presents with copious foul-smelling drainage and erythema and edema involving the left buttock.   During debridement it was discovered to be perirectal and perineal necrotizing fasciitis that involved the periurethral area around the bulb.  Pharmacy has been consulted for vancomycin dosing for cellulitis.  Plan:  Continue Vancomycin 750mg  IV q24h - check trough tonight which will be prior to 4th dose  Continue Ceftriaxone 2gm IV q24h (per ID)  Daily Scr  Follow cultures, renal function and clinical course, ID recommendations   Plan continue Vanc/Rocephin per ID notes till 9/10  Height: 5\' 11"  (180.3 cm) Weight: 226 lb 6.6 oz (102.7 kg) IBW/kg (Calculated) : 75.3  Temp (24hrs), Avg:98.5 F (36.9 C), Min:98 F (36.7 C), Max:99.1 F (37.3 C)   Recent Labs Lab 03/23/17 1343  03/23/17 1347 03/24/17 0319 03/25/17 0304 03/26/17 0246 03/26/17 1328 03/27/17 0508 03/28/17 0525 03/28/17 1911 03/29/17 0516 03/30/17 0547  WBC  --   --  29.1* 26.3* 15.3*  --   --   --   --   --   --   --   CREATININE  --   < > 1.45* 1.47* 1.54* 1.45*  1.45*  --  1.27* 1.44*  --  1.24 1.20  LATICACIDVEN 1.38  --   --   --   --   --   --   --   --   --   --   --   VANCOTROUGH  --   --   --   --   --   --  51*  --   --  14*  --   --   VANCORANDOM  --   --   --   --   --   --   --  20  --   --   --   --   < > = values in this interval not displayed.  Estimated Creatinine Clearance: 95.9 mL/min (by C-G formula based on SCr of 1.2 mg/dL).    No Known Allergies  Antimicrobials this admission:  8/13 Vanc >> 9/10 8/13 Zosyn >> 8/16 8/16 CTX >> 9/10 8/16 flagyl  HIV regimen per [facility]: Descovy + Tivicay - well controlled  Dose adjustments this admission:  8/16 VT = 33 on 1g q12h  8/17 RV= 20, ~ 24 hours from last  dose 8/18 VT at 19:00 = 14 ~24hr from last dose  Microbiology results:  8/13 L buttock abscess: MRSA, Proteus mirabilis, Strep anginosis 8/13 MRSA PCR: 8/13  Thank you for allowing pharmacy to be a part of this patient's care.   Adrian Saran, PharmD, BCPS Pager [phone] 03/30/2017 10:51 AM

## 2017-03-30 NOTE — Progress Notes (Signed)
Wound check: wound appears to have more slough than last week. Areas of tracking probed. No purulent drainage noted. Good base of granulation tissue. Wound has more surrounding erythema than last week also.     Anterior aspect of wound  Plan: Continue wet to dry dressing changes  Jackson Latino, [facility] Surgery Pager [phone]  Agree with above. The wound is getting better.  Alphonsa Overall, MD, [facility] Pager: [phone] Office phone:  [phone]

## 2017-03-30 NOTE — Progress Notes (Signed)
[facility]/Trauma Progress Note  5 Days Post-Op   Assessment/Plan Active Problems: Abscess of buttock, left Status post incision and drainage Necrotizing fasciitis ([place]) Renal insufficiency CKD (chronic kidney disease), stage III Secondary diabetes mellitus with stage 3 chronic kidney disease (HCC) Pressure injury of skin  Forniers Gangrene - S/P Irrigation and debridement and extensive fascial and soft tissue debridement left buttock, perineum and groin, Dr. Excell Seltzer, 08/13 - S/P Irrigation and debridement, dressing change, and exploration of perineal wound, Dr. Excell Seltzer, 08/15 - S/P Debridement of Fournier's gangrene and cystoscopy and insertion of Foley catheter, Dr. Matilde Sprang, 08/15 - dressing change daily and PRN., wet to dry. Continue.   FEN: carb mod VTE: SCD's, heparin ID: Zosyn &Vanc 08/13>>MRSA, proteus mirabilis, strep anginosis Follow up: TBD  DISPO: daily dressing changes and PRN.  Family coming tomorrow to do a dressing change. Tentatively home tomorrow.   Eucerin for dry skin.  Agree with above. He has an aunt, friend [name]), and mother that he thinks can manage the wound at home.  One issue is that it needs dressing changes with about every bowel movement. DN   LOS: 7 days    Subjective:  CC: Fournier's gangrene  Patient states mild pain.  No fevers, chills, vomiting, abdominal pain.  Patient states he has a friend and an aunt that we will be able to help with his dressing changes.  I stressed to the patient that every time he has a bowel movement he will require dressing change and asked if they will be able to do this.  He believes his family will be able to do this.  I stated to the patient that it would be best to have them here for a dressing change prior to discharge.  He states they should be able to come tomorrow to do this.  I asked the patient to arrange this.  Patient states chronic dry skin.  He sometimes uses  cream at home.  Objective: Vital signs in last 24 hours: Temp:  [98 F (36.7 C)-99.1 F (37.3 C)] 98 F (36.7 C) (08/20 0443) Pulse Rate:  [51-57] 53 (08/20 0443) Resp:  [16-18] 18 (08/20 0443) BP: (138-175)/(83-87) 175/87 (08/20 0443) SpO2:  [98 %-100 %] 100 % (08/20 0443) Last BM Date: 03/29/17  Intake/Output from previous day: 08/19 0701 - 08/20 0700 In: 850 [P.O.:360; I.V.:240; IV Piggyback:250] Out: 625 [Urine:625] Intake/Output this shift: Total I/O In: 200 [P.O.:200] Out: 400 [Urine:400]  PE: Gen:  Alert, NAD, pleasant, cooperative, well appearing Card:  RRR, no M/G/R heard Pulm:  CTA anteriorly, no W/R/R, rate and effort normal Abd: Soft, NT/ND, +BS, no HSM, Skin: warm and dry, dry, flaky, excoriated areas noted to BLE's and BUE's  Anti-infectives: Anti-infectives    Start     Dose/Rate Route Frequency Ordered Stop   03/27/17 1800  vancomycin (VANCOCIN) IVPB 750 mg/150 ml premix     750 mg 150 mL/hr over 60 Minutes Intravenous Every 24 hours 03/27/17 0943     03/26/17 1700  cefTRIAXone (ROCEPHIN) 2 g in dextrose 5 % 50 mL IVPB     2 g 100 mL/hr over 30 Minutes Intravenous Every 24 hours 03/26/17 1638     03/26/17 1700  metroNIDAZOLE (FLAGYL) tablet 500 mg     500 mg Oral Every 8 hours 03/26/17 1638     03/25/17 1238  piperacillin-tazobactam (ZOSYN) 3.375 GM/50ML IVPB    Comments:  Key, Kristopher   : cabinet override      03/25/17 1238 03/25/17  1336   03/24/17 1000  emtricitabine-tenofovir AF (DESCOVY) 200-25 MG per tablet 1 tablet     1 tablet Oral Daily 03/23/17 2020     03/24/17 0200  vancomycin (VANCOCIN) IVPB 1000 mg/200 mL premix  Status:  Discontinued     1,000 mg 200 mL/hr over 60 Minutes Intravenous Every 12 hours 03/23/17 2110 03/26/17 1412   03/23/17 2100  dolutegravir (TIVICAY) tablet 50 mg     50 mg Oral Daily 03/23/17 2020     03/23/17 2030  piperacillin-tazobactam (ZOSYN) IVPB 3.375 g  Status:  Discontinued     3.375 g 12.5 mL/hr over 240  Minutes Intravenous Every 8 hours 03/23/17 2027 03/26/17 1638   03/23/17 1428  vancomycin (VANCOCIN) 1-5 GM/200ML-% IVPB    Comments:  Armistead, Lacey   : cabinet override      03/23/17 1428 03/23/17 1518   03/23/17 1215  piperacillin-tazobactam (ZOSYN) IVPB 3.375 g     3.375 g 100 mL/hr over 30 Minutes Intravenous  Once 03/23/17 1211 03/23/17 1353   03/23/17 1215  vancomycin (VANCOCIN) IVPB 1000 mg/200 mL premix     1,000 mg 200 mL/hr over 60 Minutes Intravenous  Once 03/23/17 1211 03/23/17 1518      Lab Results:  No results for input(s): WBC, HGB, HCT, PLT in the last 72 hours. BMET  Recent Labs  03/28/17 0525 03/29/17 0516 03/30/17 0547  NA 136  --   --   K 3.9  --   --   CL 104  --   --   CO2 26  --   --   GLUCOSE 165*  --   --   BUN 23*  --   --   CREATININE 1.44* 1.24 1.20  CALCIUM 7.7*  --   --    PT/INR No results for input(s): LABPROT, INR in the last 72 hours. CMP     Component Value Date/Time   NA 136 03/28/2017 0525   K 3.9 03/28/2017 0525   CL 104 03/28/2017 0525   CO2 26 03/28/2017 0525   GLUCOSE 165 (H) 03/28/2017 0525   BUN 23 (H) 03/28/2017 0525   CREATININE 1.20 03/30/2017 0547   CALCIUM 7.7 (L) 03/28/2017 0525   PROT 6.2 (L) 03/25/2017 0304   ALBUMIN 1.8 (L) 03/25/2017 0304   AST 18 03/25/2017 0304   ALT 14 (L) 03/25/2017 0304   ALKPHOS 67 03/25/2017 0304   BILITOT 0.6 03/25/2017 0304   GFRNONAA >60 03/30/2017 0547   GFRAA >60 03/30/2017 0547   Lipase  No results found for: LIPASE  Studies/Results: No results found.    Kalman Drape , [facility] Surgery 03/30/2017, 9:27 AM Pager: [phone] Consults: [phone] Mon-Fri 7:00 am-4:30 pm Sat-Sun 7:00 am-11:30 am

## 2017-03-30 NOTE — Progress Notes (Signed)
5 Days Post-Op   Assessment and Plan: Perineal necrotizing fasciitis.   His foley was removed on 8/18 and he is voiding without complaints.   The urine is clear.   Please reconsult as needed.   He will not need urology f/u post discharge unless he is having specific voiding complaints.    Subjective: Francisco Hutchinson is voiding well with clear urine and no dysuria or other voiding complaints.  He has had good UOP.   ROS:  Review of Systems  Constitutional: Negative for fever.  Genitourinary: Negative for dysuria and hematuria.    Anti-infectives: Anti-infectives    Start     Dose/Rate Route Frequency Ordered Stop   03/27/17 1800  vancomycin (VANCOCIN) IVPB 750 mg/150 ml premix     750 mg 150 mL/hr over 60 Minutes Intravenous Every 24 hours 03/27/17 0943     03/26/17 1700  cefTRIAXone (ROCEPHIN) 2 g in dextrose 5 % 50 mL IVPB     2 g 100 mL/hr over 30 Minutes Intravenous Every 24 hours 03/26/17 1638     03/26/17 1700  metroNIDAZOLE (FLAGYL) tablet 500 mg     500 mg Oral Every 8 hours 03/26/17 1638     03/25/17 1238  piperacillin-tazobactam (ZOSYN) 3.375 GM/50ML IVPB    Comments:  Key, Kristopher   : cabinet override      03/25/17 1238 03/25/17 1336   03/24/17 1000  emtricitabine-tenofovir AF (DESCOVY) 200-25 MG per tablet 1 tablet     1 tablet Oral Daily 03/23/17 2020     03/24/17 0200  vancomycin (VANCOCIN) IVPB 1000 mg/200 mL premix  Status:  Discontinued     1,000 mg 200 mL/hr over 60 Minutes Intravenous Every 12 hours 03/23/17 2110 03/26/17 1412   03/23/17 2100  dolutegravir (TIVICAY) tablet 50 mg     50 mg Oral Daily 03/23/17 2020     03/23/17 2030  piperacillin-tazobactam (ZOSYN) IVPB 3.375 g  Status:  Discontinued     3.375 g 12.5 mL/hr over 240 Minutes Intravenous Every 8 hours 03/23/17 2027 03/26/17 1638   03/23/17 1428  vancomycin (VANCOCIN) 1-5 GM/200ML-% IVPB    Comments:  Armistead, Lacey   : cabinet override      03/23/17 1428 03/23/17 1518   03/23/17 1215   piperacillin-tazobactam (ZOSYN) IVPB 3.375 g     3.375 g 100 mL/hr over 30 Minutes Intravenous  Once 03/23/17 1211 03/23/17 1353   03/23/17 1215  vancomycin (VANCOCIN) IVPB 1000 mg/200 mL premix     1,000 mg 200 mL/hr over 60 Minutes Intravenous  Once 03/23/17 1211 03/23/17 1518      Current Facility-Administered Medications  Medication Dose Route Frequency Provider Last Rate Last Dose  . 0.9 %  sodium chloride infusion   Intravenous Continuous Charlynne Cousins, MD 10 mL/hr at 03/29/17 1800    . acetaminophen (TYLENOL) tablet 650 mg  650 mg Oral Q6H PRN Merton Border, MD       Or  . acetaminophen (TYLENOL) suppository 650 mg  650 mg Rectal Q6H PRN Merton Border, MD      . amLODipine (NORVASC) tablet 5 mg  5 mg Oral Daily Merton Border, MD   5 mg at 03/29/17 0945  . cefTRIAXone (ROCEPHIN) 2 g in dextrose 5 % 50 mL IVPB  2 g Intravenous Q24H Carlyle Basques, MD 100 mL/hr at 03/29/17 1800 2 g at 03/29/17 1800  . dolutegravir (TIVICAY) tablet 50 mg  50 mg Oral Daily Merton Border, MD   50 mg at  03/29/17 0947  . emtricitabine-tenofovir AF (DESCOVY) 200-25 MG per tablet 1 tablet  1 tablet Oral Daily Merton Border, MD   1 tablet at 03/29/17 0947  . fenofibrate tablet 160 mg  160 mg Oral Daily Merton Border, MD   160 mg at 03/29/17 0946  . heparin injection 5,000 Units  5,000 Units Subcutaneous Q8H Merton Border, MD   5,000 Units at 03/30/17 0533  . hydrochlorothiazide (HYDRODIURIL) tablet 25 mg  25 mg Oral Daily Merton Border, MD   25 mg at 03/29/17 0946  . HYDROcodone-acetaminophen (NORCO/VICODIN) 5-325 MG per tablet 1-2 tablet  1-2 tablet Oral Q4H PRN Merton Border, MD      . insulin aspart (novoLOG) injection 0-15 Units  0-15 Units Subcutaneous TID WC Merton Border, MD   3 Units at 03/29/17 1800  . insulin aspart (novoLOG) injection 0-5 Units  0-5 Units Subcutaneous QHS Merton Border, MD   3 Units at 03/27/17 2322  . insulin glargine (LANTUS) injection 12 Units  12 Units Subcutaneous BID Charlynne Cousins, MD    12 Units at 03/29/17 2239  . MEDLINE mouth rinse  15 mL Mouth Rinse BID Excell Seltzer, MD   15 mL at 03/29/17 2239  . metoprolol tartrate (LOPRESSOR) tablet 100 mg  100 mg Oral Daily Merton Border, MD   100 mg at 03/29/17 0945  . metroNIDAZOLE (FLAGYL) tablet 500 mg  500 mg Oral Q8H Carlyle Basques, MD   500 mg at 03/30/17 0100  . morphine 2 MG/ML injection 4 mg  4 mg Intravenous Q2H PRN Focht, Jessica L, PA   4 mg at 03/29/17 1450  . ondansetron (ZOFRAN) tablet 4 mg  4 mg Oral Q6H PRN Merton Border, MD       Or  . ondansetron (ZOFRAN) injection 4 mg  4 mg Intravenous Q6H PRN Merton Border, MD   4 mg at 03/25/17 1335  . sertraline (ZOLOFT) tablet 50 mg  50 mg Oral Daily Merton Border, MD   50 mg at 03/29/17 0946  . sodium chloride flush (NS) 0.9 % injection 10-40 mL  10-40 mL Intracatheter PRN Irine Seal, MD      . vancomycin (VANCOCIN) IVPB 750 mg/150 ml premix  750 mg Intravenous Q24H Angela Adam, Willey at 03/29/17 2200  . zolpidem (AMBIEN) tablet 5 mg  5 mg Oral QHS PRN,MR X 1 Merton Border, MD         Objective: Vital signs in last 24 hours: Temp:  [98 F (36.7 C)-99.1 F (37.3 C)] 98 F (36.7 C) (08/20 0443) Pulse Rate:  [51-57] 53 (08/20 0443) Resp:  [16-18] 18 (08/20 0443) BP: (138-175)/(83-87) 175/87 (08/20 0443) SpO2:  [98 %-100 %] 100 % (08/20 0443)  Intake/Output from previous day: 08/19 0701 - 08/20 0700 In: 850 [P.O.:360; I.V.:240; IV Piggyback:250] Out: 625 [Urine:625] Intake/Output this shift: No intake/output data recorded.   Physical Exam  Lab Results:  No results for input(s): WBC, HGB, HCT, PLT in the last 72 hours. BMET  Recent Labs  03/28/17 0525 03/29/17 0516 03/30/17 0547  NA 136  --   --   K 3.9  --   --   CL 104  --   --   CO2 26  --   --   GLUCOSE 165*  --   --   BUN 23*  --   --   CREATININE 1.44* 1.24 1.20  CALCIUM 7.7*  --   --    PT/INR No results for  input(s): LABPROT, INR in the last 72 hours. ABG No results for  input(s): PHART, HCO3 in the last 72 hours.  Invalid input(s): PCO2, PO2  Studies/Results: No results found.       LOS: 7 days    Francisco Hutchinson 03/30/2017 [phone] ID: Francisco Hutchinson, male   DOB: 04-06-1971, 46 y.o.   MRN: [id number]

## 2017-03-30 NOTE — Progress Notes (Signed)
Brief Pharmacy Note:    See full note by Arlyn Dunning, PharmD, for details. Briefly, this is a 57 y/oM on Vancomycin, Ceftriaxone, and Metronidazole for necrotizing fasciitis.     Vancomycin trough level today prior to 4th dose on this regimen = 12 mcg/mL  SCr improved since admission   Plan:  Adjust Vancomycin to 1g IV q24h.   Recheck Vancomycin trough level at new steady state.   Monitor renal function closely.    Lindell Spar, PharmD, BCPS Pager: [phone] 03/30/2017 9:49 PM

## 2017-03-31 LAB — CBC
HCT: 32.5 % — ABNORMAL LOW (ref 39.0–52.0)
Hemoglobin: 10.5 g/dL — ABNORMAL LOW (ref 13.0–17.0)
MCH: 26.7 pg (ref 26.0–34.0)
MCHC: 32.3 g/dL (ref 30.0–36.0)
MCV: 82.7 fL (ref 78.0–100.0)
PLATELETS: 429 10*3/uL — AB (ref 150–400)
RBC: 3.93 MIL/uL — ABNORMAL LOW (ref 4.22–5.81)
RDW: 20.3 % — ABNORMAL HIGH (ref 11.5–15.5)
WBC: 8.3 10*3/uL (ref 4.0–10.5)

## 2017-03-31 LAB — GLUCOSE, CAPILLARY
GLUCOSE-CAPILLARY: 99 mg/dL (ref 65–99)
GLUCOSE-CAPILLARY: 133 mg/dL — AB (ref 65–99)
GLUCOSE-CAPILLARY: 180 mg/dL — AB (ref 65–99)
GLUCOSE-CAPILLARY: 259 mg/dL — AB (ref 65–99)

## 2017-03-31 LAB — CREATININE, SERUM: Creatinine, Ser: 1.14 mg/dL (ref 0.61–1.24)

## 2017-03-31 LAB — BASIC METABOLIC PANEL
Anion gap: 8 (ref 5–15)
BUN: 17 mg/dL (ref 6–20)
CALCIUM: 8.6 mg/dL — AB (ref 8.9–10.3)
CO2: 27 mmol/L (ref 22–32)
CREATININE: 1.12 mg/dL (ref 0.61–1.24)
Chloride: 102 mmol/L (ref 101–111)
Glucose, Bld: 132 mg/dL — ABNORMAL HIGH (ref 65–99)
Potassium: 4.1 mmol/L (ref 3.5–5.1)
SODIUM: 137 mmol/L (ref 135–145)

## 2017-03-31 MED ORDER — OXYCODONE HCL 5 MG PO TABS
5.0000 mg | ORAL_TABLET | ORAL | Status: DC | PRN
  Administered 2017-04-01: 5 mg via ORAL
  Filled 2017-03-31: qty 1

## 2017-03-31 MED ORDER — MORPHINE SULFATE (PF) 2 MG/ML IV SOLN: 4.0000 mg | INTRAVENOUS | Status: DC | PRN

## 2017-03-31 NOTE — Progress Notes (Signed)
[facility] for Infectious Disease    Date of Admission:  03/23/2017   Total days of antibiotics 9          ID: Francisco Hutchinson is a 46 y.o. male with well controlled hiv disease admitted for fournier's gangrene s/p I x D x 2. Active Problems:   Abscess of buttock, left   Status post incision and drainage   Necrotizing fasciitis ([place])   Renal insufficiency   CKD (chronic kidney disease), stage III   Secondary diabetes mellitus with stage 3 chronic kidney disease (HCC)   Pressure injury of skin    Subjective: Afebrile, doing well without foley catheter. Continues to do wet to dry dressing changes BID per surgery recs  Medications:  . amLODipine  5 mg Oral Daily  . dolutegravir  50 mg Oral Daily  . emtricitabine-tenofovir AF  1 tablet Oral Daily  . fenofibrate  160 mg Oral Daily  . heparin  5,000 Units Subcutaneous Q8H  . hydrocerin   Topical BID  . hydrochlorothiazide  25 mg Oral Daily  . insulin aspart  0-15 Units Subcutaneous TID WC  . insulin aspart  0-5 Units Subcutaneous QHS  . insulin glargine  12 Units Subcutaneous BID  . mouth rinse  15 mL Mouth Rinse BID  . metoprolol tartrate  100 mg Oral Daily  . metroNIDAZOLE  500 mg Oral Q8H  . sertraline  50 mg Oral Daily    Objective: Vital signs in last 24 hours: Temp:  [98 F (36.7 C)-98.2 F (36.8 C)] 98.1 F (36.7 C) (08/21 1450) Pulse Rate:  [51-64] 60 (08/21 1450) Resp:  [16] 16 (08/21 1450) BP: (136-151)/(82-94) 136/87 (08/21 1450) SpO2:  [98 %-100 %] 100 % (08/21 1450) Physical Exam  Constitutional: He is oriented to person, place, and time. He appears well-developed and well-nourished. No distress.  HENT:  Mouth/Throat: Oropharynx is clear and moist. No oropharyngeal exudate.  Cardiovascular: Normal rate, regular rhythm and normal heart sounds. Exam reveals no gallop and no friction rub.  No murmur heard.  Pulmonary/Chest: Effort normal and breath sounds normal. No respiratory distress. He has no  wheezes.  Abdominal: Soft. Bowel sounds are normal. He exhibits no distension. There is no tenderness.  Lymphadenopathy:  He has no cervical adenopathy.  Neurological: He is alert and oriented to person, place, and time.  Skin: Skin is warm and dry. No rash noted. No erythema.  Psychiatric: He has a normal mood and affect. His behavior is normal.     Lab Results  Recent Labs  03/31/17 0300 03/31/17 1208  WBC  --  8.3  HGB  --  10.5*  HCT  --  32.5*  NA  --  137  K  --  4.1  CL  --  102  CO2  --  27  BUN  --  17  CREATININE 1.14 1.12   Liver Panel No results for input(s): PROT, ALBUMIN, AST, ALT, ALKPHOS, BILITOT, BILIDIR, IBILI in the last 72 hours. Sedimentation Rate No results for input(s): ESRSEDRATE in the last 72 hours. C-Reactive Protein No results for input(s): CRP in the last 72 hours.  Microbiology: 8/13 -tissue cx mrsa, proteus, viridans strep  Assessment/Plan: Poly microbial necrotizing fasciitis = plan on a total of 4 wk of iv ceftriaxone 2gm iv daily plus oral metronidazole 500mg  PO TID plus IV vancomycin until sep 10th (see orders on 8/17 note). Can discontinue PICC line there after  Wound care-defer to general surgery, and recs  for follow up at wound care center  hiv disease = well controlled continue on tivicay 1 tab daily plus descovy 1 tab daily  Therapeutic drug monitoring = vanco random level was 20 on 8/17. Creatinine appears stable. Currently vanco dosing is at 1000mg  daily, recommend check trough on 8/23 before the dose that day  [facility], [facility] Cell: [phone] Pager: [phone]  03/31/2017, 4:13 PM

## 2017-03-31 NOTE — Progress Notes (Signed)
CSW assisting with dc planning. [facility] has offered SNF placement and has begun [employer] authorization process. CSW will update MD / nsg / pt / family later this afternoon with authorization status.  Werner Lean LCSW [phone]

## 2017-03-31 NOTE — Progress Notes (Addendum)
CSW assisting with dc planning. Pt / family waiting to hear back from [facility] ) for possible SNF bed offer. If [facility] is unable to offer placement pt / family have other bed offers to chose from. Once SNF bed is chosen ( this am )  [employer] authorization process will be initiated. Authorization process can take up to 48 hrs. for placement decision.  Werner Lean LCSW [phone]  8:34 - CSW spoke with [facility] this am and was informed SNF is unable to offer placement. [name] ( pt's partner ) updated. He will contact pt's mother and review additional bed offers and contact CSW with placement choice. CSW expects to hear back from family within the hour.  Werner Lean LCSW [phone]

## 2017-03-31 NOTE — Discharge Summary (Signed)
[facility] Discharge Summary   Patient ID: Francisco Hutchinson MRN: [id number] DOB/AGE: [birth year] 44 y.o.  Admit date: 03/23/2017 Discharge date: 04/01/2017 Admitting Diagnosis: HIV positive Type 2 Diabetes Mellitus  Left buttock abscess  Discharge Diagnosis Fournier's gangrene involving left buttock, perineum, and groin HIV positive Type 2 Diabetes Mellitus   Consultants Internal medicine - Dr. Merton Border Urology - Dr. Irine Seal Infectious Disease - Dr. Carlyle Basques  Procedures 1. Incision and drainage and extensive fascial and soft tissue debridement of left buttock, perineum and groin - Dr. Excell Seltzer 03/23/17 2. Irrigation and debridement and dressing change and exploration of perineal wound, Dr. Excell Seltzer, 03/25/2017 3. Debridement of Fournier's gangrene and cystoscope and insertion of Foley catheter, Dr. Nicki Reaper MacDiarmid, 03/25/2017  Hospital Course:  Patient is a 46 y/o male who presented to [facility] with pain and drainage from his left buttock.  Workup showed abscess of left buttock.  Patient was admitted and underwent initial procedure listed above.  Tolerated procedure well and was transferred to the ICU. Internal medicine was consulted to assist with patient's history of stroke and diabetes. Patient initially had clear urine that became purulent. Urology was consulted and recommended cystoscopy with consideration of SP tube insertion when taken back to the OR. Patient was taken back to the OR on 08/15 for additional irrigation and debridement, cystoscope, and placement of Foley catheter. Foley catheter was removed on 8/18 and patient was voiding without any complications. Infectious disease was consult and for assistance with antibiotics and HIV medications. While in the hospital patient had twice daily wet-to-dry dressing changes. Patient was doing well. On 08/22 patient's pain was well-controlled, vital signs stable, wound stable and felt stable for  discharge to SNF. Patient will be discharged on IV and oral antibiotics. He will need labs drawn occasionally which are outlined in the vancomycin orders. End dates for antibiotics are also listed on the orders. Patient will follow up in our office in 2-3 weeks and knows to call with questions or concerns.  Patient will need to call to confirm the appointment date and time.  Patient was discharged in good condition  The [place] substance control database was reviewed prior to prescribing this patient narcotic pain medication.  PE: Gen: Alert, NAD, pleasant, cooperative, well appearing Cardio: RRR, no murmur appreciated Pulm: CTA anteriorly, no wheezes or rales noted, rate and effort normal Skin: warm and dry GU: Wound packed and dressing not removed, surrounding skin still with mild erythema, very mild tenderness, no purulent drainage noted    Allergies as of 04/01/2017   No Known Allergies     Medication List    TAKE these medications   acetaminophen 325 MG tablet Commonly known as:  TYLENOL Take 2 tablets (650 mg total) by mouth every 6 (six) hours as needed for mild pain, fever or headache (or Fever >/= 101). What changed:  medication strength  how much to take  when to take this  reasons to take this   amLODipine 5 MG tablet Commonly known as:  NORVASC Take 5 mg by mouth daily.   aspirin EC 81 MG tablet Take 81 mg by mouth at bedtime.   cefTRIAXone 2 g in dextrose 5 % 50 mL Inject 2 g into the vein daily. Labs - Once weekly:  CBC/D and BMP, Labs - Every other week:  ESR and CRP Last dose 04/23/17  DESCOVY 200-25 MG tablet Generic drug:  emtricitabine-tenofovir AF Take 1 tablet by mouth daily.   fenofibrate  160 MG tablet Take 80 mg by mouth at bedtime.   hydrocerin Crea Apply 1 application topically 2 (two) times daily.   hydrochlorothiazide 25 MG tablet Commonly known as:  HYDRODIURIL Take 25 mg by mouth daily.   LANTUS SOLOSTAR 100 UNIT/ML  Solostar Pen Generic drug:  Insulin Glargine Inject 30 Units into the skin at bedtime.   metoprolol tartrate 100 MG tablet Commonly known as:  LOPRESSOR Take 100 mg by mouth 2 (two) times daily.   metroNIDAZOLE 500 MG tablet Commonly known as:  FLAGYL Take 1 tablet (500 mg total) by mouth every 8 (eight) hours. Last dose 04/23/17  ondansetron 4 MG tablet Commonly known as:  ZOFRAN Take 1 tablet (4 mg total) by mouth every 6 (six) hours as needed for nausea.   oxyCODONE 5 MG immediate release tablet Commonly known as:  Oxy IR/ROXICODONE Take 1-2 tablets (5-10 mg total) by mouth every 4 (four) hours as needed (31m for moderate pain, 148mfor severe pain, 1032mor dressing changes).   sertraline 50 MG tablet Commonly known as:  ZOLOFT Take 50 mg by mouth at bedtime.   TIVICAY 50 MG tablet Generic drug:  dolutegravir Take 50 mg by mouth at bedtime.   TRULICITY 1.5 MG/GD/9.2EQpn Generic drug:  Dulaglutide Inject 1.5 mg into the skin once a week. On _0 /22/18 1014       Contact information for after-discharge care    [facility]   Specialty:  Skilled Nursing Facility Contact information: [phone]           Signed: JesJackson LatinoA-[facility] Pager [phone] with above.  DavAlphonsa OverallD, FACValley Medical Plaza Amb[facility] Pager: [phone] phone:  [phone]

## 2017-03-31 NOTE — Progress Notes (Signed)
CSW assisting with dc planning. [facility] contacted for insurance authorization update. SNF reports that decision is still pending. CSW will continue to follow to assist with dc planning needs.   Werner Lean LCSW [phone]

## 2017-03-31 NOTE — Progress Notes (Addendum)
[facility]/Trauma Progress Note  6 Days Post-Op   Assessment/Plan Active Problems: Abscess of buttock, left Status post incision and drainage Necrotizing fasciitis ([place]) Renal insufficiency CKD (chronic kidney disease), stage III Secondary diabetes mellitus with stage 3 chronic kidney disease (HCC) Pressure injury of skin  Forniers Gangrene - S/P Irrigation and debridement and extensive fascial and soft tissue debridement left buttock, perineum and groin, Dr. Excell Seltzer, 08/13 - S/P Irrigation and debridement, dressing change, and exploration of perineal wound, Dr. Excell Seltzer, 08/15 - S/P Debridement of Fournier's gangrene and cystoscopy and insertion of Foley catheter, Dr. Matilde Sprang, 08/15 - dressing change daily and PRN., wet to dry. Continue.   FEN: carb mod VTE: SCD's, heparin ID: Vanc 08/13>>; Rocephin 08/16>>MRSA, proteus mirabilis, strep anginosis Follow up: Dr. Excell Seltzer 2 weeks after discharge  DISPO: continue IV abx. daily dressing changes and PRN.   SNF authorization pending. Eucerin for dry skin. PO meds for dressing changes  Agree with above. He would be too much for someone to handle at home at this current time. DN   LOS: 8 days    Subjective: CC: Fournier's gangrene  Patient states mild pain. Pt is now agreeable to SNF. Tolerating dressing changes. Discussed switching to PO pain meds for dressing changes to see how he does. No family at bedside.   Objective: Vital signs in last 24 hours: Temp:  [98 F (36.7 C)-98.5 F (36.9 C)] 98 F (36.7 C) (08/21 0500) Pulse Rate:  [51-73] 64 (08/21 0958) Resp:  [16] 16 (08/21 0500) BP: (138-151)/(82-94) 151/94 (08/21 0500) SpO2:  [98 %-100 %] 100 % (08/21 0500) Last BM Date: 03/30/17  Intake/Output from previous day: 08/20 0701 - 08/21 0700 In: 1570 [P.O.:1050; I.V.:270; IV Piggyback:250] Out: 710 [Urine:710] Intake/Output this shift: No intake/output data  recorded.  PE: Gen:  Alert, NAD, pleasant, cooperative, well appearing Pulm:  rate and effort normal Skin: warm and dry GU: improved slough from yesterday to anterior wound. Areas of tracking probed. No purulent drainage noted. Good base of granulation tissue. Wound has more surrounding erythema than last week which could be 2/2 skin irritation from urinary incontinence.     Anti-infectives: Anti-infectives    Start     Dose/Rate Route Frequency Ordered Stop   03/30/17 2200  vancomycin (VANCOCIN) IVPB 1000 mg/200 mL premix     1,000 mg 200 mL/hr over 60 Minutes Intravenous Every 24 hours 03/30/17 2149     03/27/17 1800  vancomycin (VANCOCIN) IVPB 750 mg/150 ml premix  Status:  Discontinued     750 mg 150 mL/hr over 60 Minutes Intravenous Every 24 hours 03/27/17 0943 03/30/17 2148   03/26/17 1700  cefTRIAXone (ROCEPHIN) 2 g in dextrose 5 % 50 mL IVPB     2 g 100 mL/hr over 30 Minutes Intravenous Every 24 hours 03/26/17 1638     03/26/17 1700  metroNIDAZOLE (FLAGYL) tablet 500 mg     500 mg Oral Every 8 hours 03/26/17 1638     03/25/17 1238  piperacillin-tazobactam (ZOSYN) 3.375 GM/50ML IVPB    Comments:  Key, Kristopher   : cabinet override      03/25/17 1238 03/25/17 1336   03/24/17 1000  emtricitabine-tenofovir AF (DESCOVY) 200-25 MG per tablet 1 tablet     1 tablet Oral Daily 03/23/17 2020     03/24/17 0200  vancomycin (VANCOCIN) IVPB 1000 mg/200 mL premix  Status:  Discontinued     1,000 mg 200 mL/hr over 60 Minutes Intravenous Every 12 hours 03/23/17 2110 03/26/17  1412   03/23/17 2100  dolutegravir (TIVICAY) tablet 50 mg     50 mg Oral Daily 03/23/17 2020     03/23/17 2030  piperacillin-tazobactam (ZOSYN) IVPB 3.375 g  Status:  Discontinued     3.375 g 12.5 mL/hr over 240 Minutes Intravenous Every 8 hours 03/23/17 2027 03/26/17 1638   03/23/17 1428  vancomycin (VANCOCIN) 1-5 GM/200ML-% IVPB    Comments:  Armistead, Lacey   : cabinet override      03/23/17 1428 03/23/17 1518    03/23/17 1215  piperacillin-tazobactam (ZOSYN) IVPB 3.375 g     3.375 g 100 mL/hr over 30 Minutes Intravenous  Once 03/23/17 1211 03/23/17 1353   03/23/17 1215  vancomycin (VANCOCIN) IVPB 1000 mg/200 mL premix     1,000 mg 200 mL/hr over 60 Minutes Intravenous  Once 03/23/17 1211 03/23/17 1518      Lab Results:  No results for input(s): WBC, HGB, HCT, PLT in the last 72 hours. BMET  Recent Labs  03/30/17 0547 03/31/17 0300  CREATININE 1.20 1.14   PT/INR No results for input(s): LABPROT, INR in the last 72 hours. CMP     Component Value Date/Time   NA 136 03/28/2017 0525   K 3.9 03/28/2017 0525   CL 104 03/28/2017 0525   CO2 26 03/28/2017 0525   GLUCOSE 165 (H) 03/28/2017 0525   BUN 23 (H) 03/28/2017 0525   CREATININE 1.14 03/31/2017 0300   CALCIUM 7.7 (L) 03/28/2017 0525   PROT 6.2 (L) 03/25/2017 0304   ALBUMIN 1.8 (L) 03/25/2017 0304   AST 18 03/25/2017 0304   ALT 14 (L) 03/25/2017 0304   ALKPHOS 67 03/25/2017 0304   BILITOT 0.6 03/25/2017 0304   GFRNONAA >60 03/31/2017 0300   GFRAA >60 03/31/2017 0300   Lipase  No results found for: LIPASE  Studies/Results: No results found.  Kalman Drape , [facility] Surgery 03/31/2017, 10:58 AM Pager: [phone] Consults: [phone]  Alphonsa Overall, MD, [facility] Pager: [phone] Office phone:  [phone]

## 2017-04-01 LAB — CREATININE, SERUM
CREATININE: 1.23 mg/dL (ref 0.61–1.24)
GFR calc Af Amer: >60 mL/min (ref >60)
GFR calc non Af Amer: >60 mL/min (ref >60)

## 2017-04-01 LAB — GLUCOSE, CAPILLARY
GLUCOSE-CAPILLARY: 102 mg/dL — AB (ref 65–99)
Glucose-Capillary: 85 mg/dL (ref 65–99)

## 2017-04-01 MED ORDER — VANCOMYCIN HCL IN DEXTROSE 1-5 GM/200ML-% IV SOLN: 1000.0000 mg | INTRAVENOUS | Status: DC

## 2017-04-01 MED ORDER — DEXTROSE 5 % IV SOLN: 2.0000 g | INTRAVENOUS | Status: DC

## 2017-04-01 MED ORDER — METRONIDAZOLE 500 MG PO TABS: 500.0000 mg | ORAL_TABLET | Freq: Three times a day (TID) | ORAL | 0 refills | Status: AC

## 2017-04-01 MED ORDER — HYDROCERIN EX CREA: 1.0000 "application " | TOPICAL_CREAM | Freq: Two times a day (BID) | CUTANEOUS | 0 refills | Status: DC

## 2017-04-01 MED ORDER — OXYCODONE HCL 5 MG PO TABS: 5.0000 mg | ORAL_TABLET | ORAL | 0 refills | Status: DC | PRN

## 2017-04-01 MED ORDER — VANCOMYCIN HCL IN DEXTROSE 1-5 GM/200ML-% IV SOLN: 1000.0000 mg | INTRAVENOUS | Status: AC

## 2017-04-01 MED ORDER — ONDANSETRON HCL 4 MG PO TABS: 4.0000 mg | ORAL_TABLET | Freq: Four times a day (QID) | ORAL | 0 refills | Status: DC | PRN

## 2017-04-01 MED ORDER — DEXTROSE 5 % IV SOLN: 2.0000 g | INTRAVENOUS | Status: AC

## 2017-04-01 MED ORDER — ACETAMINOPHEN 325 MG PO TABS: 650.0000 mg | ORAL_TABLET | Freq: Four times a day (QID) | ORAL | Status: DC | PRN

## 2017-04-01 NOTE — Progress Notes (Signed)
CSW contacted by [facility] reporting that [employer] has provided authorization for SNF placement today. CSW will assist with dc planning to SNF.  Werner Lean LCSW [phone]

## 2017-04-01 NOTE — Clinical Social Work Placement (Signed)
   CLINICAL SOCIAL WORK PLACEMENT  NOTE  Date:  04/01/2017  Patient Details  Name: Francisco Hutchinson MRN: [id number] Date of Birth: June 02, 1971  Clinical Social Work is seeking post-discharge placement for this patient at the [place] level of care (*CSW will initial, date and re-position this form in  chart as items are completed):  Yes   Patient/family provided with [facility] Work Department's list of facilities offering this level of care within the geographic area requested by the patient (or if unable, by the patient's family).  Yes   Patient/family informed of their freedom to choose among providers that offer the needed level of care, that participate in Medicare, Medicaid or managed care program needed by the patient, have an available bed and are willing to accept the patient.  Yes   Patient/family informed of [facility] ownership interest in [facility] and [facility], as well as of the fact that they are under no obligation to receive care at these facilities.  PASRR submitted to EDS on       PASRR number received on       Existing PASRR number confirmed on       FL2 transmitted to all facilities in geographic area requested by pt/family on       FL2 transmitted to all facilities within larger geographic area on       Patient informed that his/her managed care company has contracts with or will negotiate with certain facilities, including the following:        Yes   Patient/family informed of bed offers received.  Patient chooses bed at [facility]     Physician recommends and patient chooses bed at      Patient to be transferred to [facility] on 04/01/17.  Patient to be transferred to facility by [facility]     Patient family notified on 04/01/17 of transfer.  Name of family member notified:  Mother / Partner     PHYSICIAN       Additional Comment: Pt / family are in agreement with dc to [facility]  today. BCBS has provided authorization for SNF. [facility] transport is required. Medical necessity form completed.DC Summary sent to SNF for review. Scripts included in [employer]. # for report provided to nsg.   _______________________________________________ Luretha Rued, LCSW  [phone] 04/01/2017, 1:25 PM

## 2017-04-01 NOTE — Discharge Instructions (Signed)
WOUND CARE: - dressing to be changed twice daily - supplies: sterile saline, kerlix, scissors, ABD pads, tape  - remove dressing and all packing carefully, moistening with sterile saline as needed to avoid packing/internal dressing sticking to the wound. - clean edges of skin around the wound with water/gauze, making sure there is no tape debris or leakage left on skin that could cause skin irritation or breakdown. - dampen and clean kerlix with sterile saline and pack wound from wound base to skin level, making sure to take note of any possible areas of wound tracking, tunneling and packing appropriately. Wound can be packed loosely. Trim kerlix to size if a whole kerlix is not required. - cover wound with a dry ABD pad and secure with tape.  - write the date/time on the dry dressing/tape to better track when the last dressing change occurred. - apply any skin protectant/powder recommended by clinician to protect skin/skin folds. - change dressing as needed if leakage occurs, wound gets contaminated from stool, or patient requests to shower. Change dressing after each bowel movement - patient may shower daily with wound open and following the shower the wound should be dried and a clean dressing placed.  Please be sure to note areas of wound tracking and be sure to pack those areas  Please note labs required with Vancomycin as noted on the order. Also please note end dates for antibiotics.

## 2017-04-01 NOTE — Progress Notes (Signed)
Spoke with Roselyn Reef, SW, and was notified that pt has been approved for placement at [facility] today. She needs DC Summary and any narcotic prescriptions printed. Pt will be providing his own HIV medications.   Notified [place], [place], of above. She will be seeing pt to assess. She requested to be notified her if pt has a BM and needs dressing change. Agreed to plan. Will monitor pt and notify of above.

## 2017-04-01 NOTE — Progress Notes (Signed)
Reviewed AVS and discharge plan/summary w/pt. He verbalized understanding and offered no questions/concerns at this time. Pt's prescription and home HIV meds mother brought today were placed in packet for [facility]. Pt in stable condition w/pain controlled; discharged via [facility] w/all belongings.  Called and gave report to pt's nurse at [facility]. Questions answered to her satisfaction. She agreed to contact this facility if any questions/concerns arose.

## 2017-04-02 DIAGNOSIS — B20 Human immunodeficiency virus [HIV] disease: Secondary | ICD-10-CM

## 2017-04-23 ENCOUNTER — Ambulatory Visit (INDEPENDENT_AMBULATORY_CARE_PROVIDER_SITE_OTHER): Payer: BLUE CROSS/BLUE SHIELD | Admitting: Internal Medicine

## 2017-04-23 ENCOUNTER — Encounter: Payer: Self-pay | Admitting: Internal Medicine

## 2017-04-23 VITALS — BP 118/79 | HR 68 | Temp 98.0°F

## 2017-04-23 DIAGNOSIS — N183 Chronic kidney disease, stage 3 unspecified: Secondary | ICD-10-CM

## 2017-04-23 DIAGNOSIS — L309 Dermatitis, unspecified: Secondary | ICD-10-CM | POA: Diagnosis not present

## 2017-04-23 DIAGNOSIS — N493 Fournier gangrene: Secondary | ICD-10-CM

## 2017-04-23 DIAGNOSIS — B20 Human immunodeficiency virus [HIV] disease: Secondary | ICD-10-CM | POA: Diagnosis not present

## 2017-04-23 NOTE — Progress Notes (Signed)
  Patient ID: Francisco Hutchinson, male   DOB: 12/04/1970, 46 y.o.   MRN: [id number]  HPI  Francisco Hutchinson is a 46 y.o. male with well controlled hiv disease admitted for fournier's gangrene s/p I x D x 2 in August 2018 by dr Francisco Hutchinson. The patient was discharged to SNF on 4 wk of ceftriaxone, oral metronidazole and vancomycin which ends today. Doing well. Has had 3 loose BM yesterday. Wound bed getting smaller. Continues to get dressing changes with dakin's soln at minimumx of bid. Miscommunication at SNF where he missed his appt this am with dr Francisco Hutchinson. He reports his dry skin being worse, constantly itching including his back. He reports that the staff who do his dressing changes say the wound bed is starting to decrease in size Outpatient Encounter Prescriptions as of 04/23/2017  Medication Sig  . acetaminophen (TYLENOL) 325 MG tablet Take 2 tablets (650 mg total) by mouth every 6 (six) hours as needed for mild pain, fever or headache (or Fever >/= 101).  . amLODipine (NORVASC) 5 MG tablet Take 5 mg by mouth daily.   . aspirin EC 81 MG tablet Take 81 mg by mouth at bedtime.  . cefTRIAXone 2 g in dextrose 5 % 50 mL Inject 2 g into the vein daily. Labs - Once weekly:  CBC/D and BMP, Labs - Every other week:  ESR and CRP  . DESCOVY 200-25 MG tablet Take 1 tablet by mouth daily.  . fenofibrate 160 MG tablet Take 80 mg by mouth at bedtime.   . hydrocerin (EUCERIN) CREA Apply 1 application topically 2 (two) times daily.  . hydrochlorothiazide (HYDRODIURIL) 25 MG tablet Take 25 mg by mouth daily.   . LANTUS SOLOSTAR 100 UNIT/ML Solostar Pen Inject 30 Units into the skin at bedtime.   . metoprolol tartrate (LOPRESSOR) 100 MG tablet Take 100 mg by mouth 2 (two) times daily.   . metroNIDAZOLE (FLAGYL) 500 MG tablet Take 1 tablet (500 mg total) by mouth every 8 (eight) hours.  . ondansetron (ZOFRAN) 4 MG tablet Take 1 tablet (4 mg total) by mouth every 6 (six) hours as needed for nausea.  .  oxyCODONE (OXY IR/ROXICODONE) 5 MG immediate release tablet Take 1-2 tablets (5-10 mg total) by mouth every 4 (four) hours as needed (5mg for moderate pain, 10mg for severe pain, 10mg for dressing changes).  . sertraline (ZOLOFT) 50 MG tablet Take 50 mg by mouth at bedtime.   . TIVICAY 50 MG tablet Take 50 mg by mouth at bedtime.   . TRULICITY 1.5 MG/0.5ML SOPN Inject 1.5 mg into the skin once a week. On Sunday   No facility-administered encounter medications on file as of 04/23/2017.      Patient Active Problem List   Diagnosis Date Noted  . HIV disease (HCC)   . CKD (chronic kidney disease), stage III 03/25/2017  . Secondary diabetes mellitus with stage 3 chronic kidney disease (HCC) 03/25/2017  . Pressure injury of skin 03/25/2017  . Abscess of buttock, left 03/23/2017  . Status post incision and drainage 03/23/2017  . Necrotizing fasciitis (HCC) 03/23/2017  . Renal insufficiency 03/23/2017     Health Maintenance Due  Topic Date Due  . PNEUMOCOCCAL POLYSACCHARIDE VACCINE (1) 03/06/1975  . FOOT EXAM  03/06/1983  . OPHTHALMOLOGY EXAM  03/06/1983  . URINE MICROALBUMIN  03/06/1983  . TETANUS/TDAP  03/05/1992  . INFLUENZA VACCINE  03/11/2017     Review of Systems +dry skin, diarrhea, otherwise 12 point ros   is negative Physical Exam   BP 118/79   Pulse 68   Temp 98 F (36.7 C) (Oral)   Physical Exam  Constitutional: He is oriented to person, place, and time. He appears chronically ill. No distress.  HENT: peridontal disease, but no thrush. sebhorric dermatitis to face, eyebrows Mouth/Throat: Oropharynx is clear and moist. No oropharyngeal exudate.  Cardiovascular: Normal rate, regular rhythm and normal heart sounds. Exam reveals no gallop and no friction rub.  No murmur heard.  Pulmonary/Chest: Effort normal and breath sounds normal. No respiratory distress. He has no wheezes.  Abdominal: Soft. Bowel sounds are normal. He exhibits no distension. There is no tenderness.    Lymphadenopathy:  He has no cervical adenopathy.  Ext: picc line is c/d/i, excessive dry flaky dermatitis to arms and legs, abd Back = blanching erythema coelescing plaque -likely from heat rash gu = wound bed is 11.5 length 3.3 cm deep, good granulation bed some slough noted at inferior aspect, towards rectum. Brown soft stool in the diaper noted Neurological: He is alert and oriented to person, place, and time. Lower extremity weakness, wearing braces, muscle wasting to legs Skin: Skin is warm and dry. No rash noted. No erythema.  Psychiatric: He has a normal mood and affect. His behavior is normal.    Lab Results  Component Value Date   CD4TCELL 29 (L) 03/23/2017   Lab Results  Component Value Date   CD4TABS 310 (L) 03/23/2017   Lab Results  Component Value Date   HIV1RNAQUANT <20 03/25/2017    CBC Lab Results  Component Value Date   WBC 8.3 03/31/2017   RBC 3.93 (L) 03/31/2017   HGB 10.5 (L) 03/31/2017   HCT 32.5 (L) 03/31/2017   PLT 429 (H) 03/31/2017   MCV 82.7 03/31/2017   MCH 26.7 03/31/2017   MCHC 32.3 03/31/2017   RDW 20.3 (H) 03/31/2017   LYMPHSABS 1.2 03/23/2017   MONOABS 1.5 (H) 03/23/2017   EOSABS 0.0 03/23/2017    BMET Lab Results  Component Value Date   NA 137 03/31/2017   K 4.1 03/31/2017   CL 102 03/31/2017   CO2 27 03/31/2017   GLUCOSE 132 (H) 03/31/2017   BUN 17 03/31/2017   CREATININE 1.23 04/01/2017   CALCIUM 8.6 (L) 03/31/2017   GFRNONAA >60 04/01/2017   GFRAA >60 04/01/2017   Lab Results  Component Value Date   ESRSEDRATE 77 (H) 04/23/2017   Lab Results  Component Value Date   CRP 2.5 04/23/2017      Assessment and Plan  Fournier's gangrene =  Wound bed looks excellent remains roughly 3 cm deep but 10 cm long to left side of scrotum. Inferior aspect has some slough associated possibly fecal contamination?  Will assist on getting him appt with dr Francisco Hutchinson next week to make up for miscommunication with SNF. Will let mother  and the patient's significant other know of appt so that they can attend  - change abtx to amox/clav plus doxy x 4 wk - will check sed rate and crp  hiv disease = well controlled.  - continue on hiv meds - will check hiv vl and cd 4 count  ckd 3 - will check cr  Dermatitis = ask snf to apply vaseline/eucerin 50/50 BID to affected area  Candidal infection to groin = asked snf to do nystatin cream/powder    

## 2017-04-24 LAB — T-HELPER CELL (CD4) - (RCID CLINIC ONLY)
CD4 T CELL ABS: 480 /uL (ref 400–2700)
CD4 T CELL HELPER: 28 % — AB (ref 33–55)

## 2017-04-27 LAB — COMPLETE METABOLIC PANEL WITH GFR
AG Ratio: 0.8 (calc) — ABNORMAL LOW (ref 1.0–2.5)
ALT: 11 U/L (ref 9–46)
AST: 17 U/L (ref 10–40)
Albumin: 3 g/dL — ABNORMAL LOW (ref 3.6–5.1)
Alkaline phosphatase (APISO): 81 U/L (ref 40–115)
BUN: 18 mg/dL (ref 7–25)
CALCIUM: 9 mg/dL (ref 8.6–10.3)
CO2: 22 mmol/L (ref 20–32)
Chloride: 103 mmol/L (ref 98–110)
Creat: 1.2 mg/dL (ref 0.60–1.35)
GFR, EST NON AFRICAN AMERICAN: 73 mL/min/{1.73_m2} (ref >=60)
GFR, Est African American: 85 mL/min/{1.73_m2} (ref >=60)
GLOBULIN: 3.9 g/dL — AB (ref 1.9–3.7)
Glucose, Bld: 274 mg/dL — ABNORMAL HIGH (ref 65–99)
Potassium: 4.3 mmol/L (ref 3.5–5.3)
Sodium: 135 mmol/L (ref 135–146)
Total Bilirubin: 0.3 mg/dL (ref 0.2–1.2)
Total Protein: 6.9 g/dL (ref 6.1–8.1)

## 2017-04-27 LAB — CBC WITH DIFFERENTIAL/PLATELET
Basophils Absolute: 72 cells/uL (ref 0–200)
Basophils Relative: 0.8 %
EOS PCT: 2.1 %
Eosinophils Absolute: 189 cells/uL (ref 15–500)
HCT: 35.6 % — ABNORMAL LOW (ref 38.5–50.0)
Hemoglobin: 11.9 g/dL — ABNORMAL LOW (ref 13.2–17.1)
LYMPHS ABS: 1638 {cells}/uL (ref 850–3900)
MCH: 28.3 pg (ref 27.0–33.0)
MCHC: 33.4 g/dL (ref 32.0–36.0)
MCV: 84.6 fL (ref 80.0–100.0)
MPV: 11.8 fL (ref 7.5–12.5)
Monocytes Relative: 7.8 %
Neutro Abs: 6399 cells/uL (ref 1500–7800)
Neutrophils Relative %: 71.1 %
PLATELETS: 293 10*3/uL (ref 140–400)
RBC: 4.21 10*6/uL (ref 4.20–5.80)
RDW: 19.5 % — AB (ref 11.0–15.0)
TOTAL LYMPHOCYTE: 18.2 %
WBC: 9 10*3/uL (ref 3.8–10.8)
WBCMIX: 702 {cells}/uL (ref 200–950)

## 2017-04-27 LAB — HIV-1 RNA QUANT-NO REFLEX-BLD
HIV 1 RNA QUANT: NOT DETECTED {copies}/mL
HIV-1 RNA QUANT, LOG: NOT DETECTED {Log_copies}/mL

## 2017-04-27 LAB — C-REACTIVE PROTEIN: CRP: 2.5 mg/L (ref <8.0)

## 2017-04-27 LAB — SEDIMENTATION RATE: Sed Rate: 77 mm/h — ABNORMAL HIGH (ref 0–15)

## 2017-05-28 ENCOUNTER — Encounter: Payer: Self-pay | Admitting: Family Medicine

## 2017-05-28 ENCOUNTER — Ambulatory Visit (INDEPENDENT_AMBULATORY_CARE_PROVIDER_SITE_OTHER): Payer: BLUE CROSS/BLUE SHIELD | Admitting: Family Medicine

## 2017-05-28 VITALS — BP 110/82 | HR 74

## 2017-05-28 DIAGNOSIS — M726 Necrotizing fasciitis: Secondary | ICD-10-CM

## 2017-05-28 DIAGNOSIS — E1322 Other specified diabetes mellitus with diabetic chronic kidney disease: Secondary | ICD-10-CM

## 2017-05-28 DIAGNOSIS — F329 Major depressive disorder, single episode, unspecified: Secondary | ICD-10-CM

## 2017-05-28 DIAGNOSIS — B20 Human immunodeficiency virus [HIV] disease: Secondary | ICD-10-CM

## 2017-05-28 DIAGNOSIS — L732 Hidradenitis suppurativa: Secondary | ICD-10-CM | POA: Diagnosis not present

## 2017-05-28 DIAGNOSIS — Z23 Encounter for immunization: Secondary | ICD-10-CM | POA: Diagnosis not present

## 2017-05-28 DIAGNOSIS — I639 Cerebral infarction, unspecified: Secondary | ICD-10-CM | POA: Diagnosis not present

## 2017-05-28 DIAGNOSIS — N183 Chronic kidney disease, stage 3 (moderate): Secondary | ICD-10-CM

## 2017-05-28 DIAGNOSIS — B372 Candidiasis of skin and nail: Secondary | ICD-10-CM | POA: Diagnosis not present

## 2017-05-28 DIAGNOSIS — F419 Anxiety disorder, unspecified: Secondary | ICD-10-CM

## 2017-05-28 MED ORDER — CHLORHEXIDINE GLUCONATE 4 % EX LIQD: Freq: Every day | CUTANEOUS | 11 refills | Status: DC | PRN

## 2017-05-28 MED ORDER — NYSTATIN 100000 UNIT/GM EX POWD: Freq: Four times a day (QID) | CUTANEOUS | 11 refills | Status: DC

## 2017-05-28 NOTE — Progress Notes (Signed)
Subjective:  Francisco Hutchinson is a 46 y.o. male who presents today with a chief complaint of cutaneous abscess and to establish care.   HPI:  Necrotizing Fasciitis, New Problem Patient hospitalized 2 months ago with buttock abscess and necrotizing fasciitis.  He was discharged to SNF for several weeks.  Reports that this area is now healing well.  Has wound care evaluating him multiple times weekly.  He will be following up with Dr. Excell Seltzer next month.  He requests that I inspect the area today.  Hidradenitis, New problem Several year history.  Located in his groin predominantly however also has some lesions in his axilla bilaterally.  Occasionally has to have I&D for large abscesses.  No treatments tried in the past.  No current abscesses.  DIABETES Type II, Chronic, new problem to this provider Medications: Lantus 30 units nightly, Trulicity 1.5 mg weekly, Reports taking and tolerating without side effects. Blood Sugars per patient: Fasting: 180s-200s, High: 270s Additional History-several year history.  Was on NovoLog while at SNF, and reports that his blood sugars were better controlled.  Is been home for about 2 weeks and noticed that his blood sugars are increasing again.  ROS: Denies Polyuria,Polydipsia, Denies  Hypoglycemia symptoms (palpitations, tremors, anxiousness)   Anxiety and depression, chronic problem, new to this provider Currently on sertraline 75 mg daily.  Stable on this dose.  Thinks this helps with his symptoms.  His mother and significant other are interested in increasing his dose of sertraline.  History of HIV disease, chronic problem, new to this provider Followed by infectious disease.  Current regimen includes tivicay and descovy.  Patient is requesting that his viral load and CD4 counts be checked today.  ROS: Per HPI, otherwise a 14 point review of systems was performed and was negative  PMH:  The following were reviewed and entered/updated in  epic: Past Medical History:  Diagnosis Date  . Allergy   . Diabetes mellitus without complication ([place])   . HIV (human immunodeficiency virus infection) ([place])   . Hypertension   . Stroke [facility])    Patient Active Problem List   Diagnosis Date Noted  . Hidradenitis 05/29/2017  . Anxiety and depression 05/29/2017  . Candidal intertrigo 05/29/2017  . Stroke ([place])   . HIV disease ([place])   . CKD (chronic kidney disease), stage III ([place]) 03/25/2017  . Secondary diabetes mellitus with stage 3 chronic kidney disease ([place]) 03/25/2017  . Necrotizing fasciitis ([place]) 03/23/2017   Past Surgical History:  Procedure Laterality Date  . abscess removed from back and left armpit    . IRRIGATION AND DEBRIDEMENT BUTTOCKS Left 03/23/2017   Procedure: IRRIGATION AND DEBRIDEMENT LEFT BUTTOCK ABSCESS;  Surgeon: Excell Seltzer, MD;  Location: [facility] ORS;  Service: General;  Laterality: Left;  . IRRIGATION AND DEBRIDEMENT BUTTOCKS N/A 03/25/2017   Procedure: IRRIGATION AND DEBRIDEMENT DRESSING CHANGE AND EXPLORATION OF PERINEAL WOUND; CYSTOSCOPY;  Surgeon: Excell Seltzer, MD;  Location: [facility] ORS;  Service: General;  Laterality: N/A;  . Surgery Right Arm      Family History  Problem Relation Age of Onset  . Arthritis Mother   . Hypertension Mother   . Alcohol abuse Father   . Cancer Father   . Diabetes Father   . Early death Father   . Hyperlipidemia Father   . Hypertension Father   . Alcohol abuse Sister   . Heart attack Maternal Grandmother   . Heart disease Maternal Grandmother   . Hypertension Maternal Grandmother   .  Cancer Maternal Grandfather        lung  . Cancer Paternal Grandmother   . Diabetes Paternal Grandmother     Medications- reviewed and updated Current Outpatient Prescriptions  Medication Sig Dispense Refill  . acetaminophen (TYLENOL) 325 MG tablet Take 2 tablets (650 mg total) by mouth every 6 (six) hours as needed for mild pain, fever or headache (or Fever >/= 101).    Marland Kitchen  amLODipine (NORVASC) 5 MG tablet Take 5 mg by mouth daily.   10  . aspirin EC 81 MG tablet Take 81 mg by mouth at bedtime.    . DESCOVY 200-25 MG tablet Take 1 tablet by mouth daily.  11  . fenofibrate 160 MG tablet Take 80 mg by mouth at bedtime.   0  . Ferrous Gluconate (IRON 27 PO) Take one tablet daily.    . hydrocerin (EUCERIN) CREA Apply 1 application topically 2 (two) times daily.  0  . hydrochlorothiazide (HYDRODIURIL) 25 MG tablet Take 25 mg by mouth daily.   1  . LANTUS SOLOSTAR 100 UNIT/ML Solostar Pen Inject 30 Units into the skin at bedtime.   6  . metoprolol tartrate (LOPRESSOR) 100 MG tablet Take 100 mg by mouth 2 (two) times daily.   0  . ondansetron (ZOFRAN) 4 MG tablet Take 1 tablet (4 mg total) by mouth every 6 (six) hours as needed for nausea. 20 tablet 0  . oxyCODONE (OXY IR/ROXICODONE) 5 MG immediate release tablet Take 1-2 tablets (5-10 mg total) by mouth every 4 (four) hours as needed (5mg  for moderate pain, 10mg  for severe pain, 10mg  for dressing changes). 40 tablet 0  . sertraline (ZOLOFT) 50 MG tablet Take 100 mg by mouth. Take 1 and 1/2 tablets by mouth.  10  . TIVICAY 50 MG tablet Take 50 mg by mouth at bedtime.   11  . TRULICITY 1.5 YB/0.1BP SOPN Inject 1.5 mg into the skin once a week. On Sunday  1  . chlorhexidine (HIBICLENS) 4 % external liquid Apply topically daily as needed. 120 mL 11  . nystatin (MYCOSTATIN/NYSTOP) powder Apply topically 4 (four) times daily. 15 g 11   No current facility-administered medications for this visit.     Allergies-reviewed and updated No Known Allergies  Social History   Social History  . Marital status: Single    Spouse name: N/A  . Number of children: N/A  . Years of education: N/A   Social History Main Topics  . Smoking status: Never Smoker  . Smokeless tobacco: Never Used  . Alcohol use 1.2 oz/week    1 Glasses of wine, 1 Cans of beer per week     Comment: rare  . Drug use: No  . Sexual activity: No   Other  Topics Concern  . None   Social History Narrative  . None    Objective:  Physical Exam: BP 110/82   Pulse 74   SpO2 99%   Gen: NAD, resting comfortably in wheelchair CV: RRR with no murmurs appreciated Pulm: NWOB, CTAB with no crackles, wheezes, or rhonchi GI: Normal bowel sounds present. Soft, Nontender, Nondistended. GU: Erythematous rash noted in inguinal area. No abcesses noted. Open wound noted in left inguinal fold - well healing appearing without signs of infection. MSK: No edema, cyanosis, or clubbing noted Skin: Warm, dry Neuro: In wheel chair, moves upper extremities spontaneously Psych: Normal affect and thought content  Assessment/Plan:  Necrotizing fasciitis ([place]) Appears to be well-healing today.  Dressing changed today.  Continue dressing changes with wound care.  Follow-up with surgery next month.  Hidradenitis No active abscesses today.  Discussed measures to reduce recurrence including better glycemic control and hygiene.  Start Hibiclens.  Consider Clindagel in the future.  Return precautions reviewed.  Secondary diabetes mellitus with stage 3 chronic kidney disease (HCC) Proceed with patient titrated increase of Lantus.  Instructed patient to increase Lantus by 1 unit every morning his fasting sugar is greater than 125.  Continue Trulicity 1.5 mg weekly.  Follow-up in 1 month.  Repeat A1c at that time.  Consider standing dose of NovoLog if A1c still elevated with controlled fasting sugars.  Anxiety and depression Increase sertraline to 100 mg daily.  Follow-up in 4 weeks.  Return precautions reviewed.  HIV disease ([place]) Check viral load and CD4 counts today.  Candidal intertrigo Start nystatin powder.  Stroke [facility]) Continue low-dose aspirin.  Check lipid panel today.  Preventative healthcare Flu shot given today.  Check lipid panel.  Time Spent: I spent 60 minutes face-to-face with the patient, with more than half spent on counseling for the above  issues including diabetes management, wound care, anxiety treatment options, and preventative measures for hidradentitis.   Algis Greenhouse. [name], MD 05/29/2017 8:55 AM

## 2017-05-28 NOTE — Patient Instructions (Addendum)
Please increase your lantus by 1 unit each day that your fasting sugar is 125 or higher.  Increase your sertraline to 100mg  daily.  Try the hibiclens.  Come back to see me after 06/23/2017 to discuss your blood sugar, or sooner as needed.   Take care,  Dr [name]

## 2017-05-29 ENCOUNTER — Telehealth: Payer: Self-pay | Admitting: Family Medicine

## 2017-05-29 DIAGNOSIS — B372 Candidiasis of skin and nail: Secondary | ICD-10-CM | POA: Insufficient documentation

## 2017-05-29 DIAGNOSIS — L732 Hidradenitis suppurativa: Secondary | ICD-10-CM | POA: Insufficient documentation

## 2017-05-29 DIAGNOSIS — F32A Depression, unspecified: Secondary | ICD-10-CM

## 2017-05-29 DIAGNOSIS — F329 Major depressive disorder, single episode, unspecified: Secondary | ICD-10-CM | POA: Insufficient documentation

## 2017-05-29 DIAGNOSIS — F419 Anxiety disorder, unspecified: Secondary | ICD-10-CM

## 2017-05-29 DIAGNOSIS — I639 Cerebral infarction, unspecified: Secondary | ICD-10-CM | POA: Insufficient documentation

## 2017-05-29 DIAGNOSIS — Z8673 Personal history of transient ischemic attack (TIA), and cerebral infarction without residual deficits: Secondary | ICD-10-CM | POA: Insufficient documentation

## 2017-05-29 HISTORY — DX: Anxiety disorder, unspecified: F41.9

## 2017-05-29 HISTORY — DX: Depression, unspecified: F32.A

## 2017-05-29 LAB — LIPID PANEL
Cholesterol: 240 mg/dL — ABNORMAL HIGH (ref 0–200)
HDL: 31.9 mg/dL — ABNORMAL LOW (ref >39.00)
NonHDL: 208.44
Total CHOL/HDL Ratio: 8
Triglycerides: 283 mg/dL — ABNORMAL HIGH (ref 0.0–149.0)
VLDL: 56.6 mg/dL — AB (ref 0.0–40.0)

## 2017-05-29 LAB — COMPREHENSIVE METABOLIC PANEL
ALK PHOS: 98 U/L (ref 39–117)
ALT: 13 U/L (ref 0–53)
AST: 18 U/L (ref 0–37)
Albumin: 3.3 g/dL — ABNORMAL LOW (ref 3.5–5.2)
BILIRUBIN TOTAL: 0.3 mg/dL (ref 0.2–1.2)
BUN: 27 mg/dL — AB (ref 6–23)
CO2: 25 mEq/L (ref 19–32)
CREATININE: 1.62 mg/dL — AB (ref 0.40–1.50)
Calcium: 9.7 mg/dL (ref 8.4–10.5)
Chloride: 100 mEq/L (ref 96–112)
GFR: 49.39 mL/min — ABNORMAL LOW (ref >60.00)
Glucose, Bld: 179 mg/dL — ABNORMAL HIGH (ref 70–99)
Potassium: 4.4 mEq/L (ref 3.5–5.1)
Sodium: 139 mEq/L (ref 135–145)
Total Protein: 7.9 g/dL (ref 6.0–8.3)

## 2017-05-29 LAB — CBC
HCT: 41.3 % (ref 39.0–52.0)
HEMOGLOBIN: 13.6 g/dL (ref 13.0–17.0)
MCHC: 32.9 g/dL (ref 30.0–36.0)
MCV: 94.4 fl (ref 78.0–100.0)
PLATELETS: 334 10*3/uL (ref 150.0–400.0)
RBC: 4.38 Mil/uL (ref 4.22–5.81)
RDW: 15.9 % — ABNORMAL HIGH (ref 11.5–15.5)
WBC: 9.6 10*3/uL (ref 4.0–10.5)

## 2017-05-29 LAB — LDL CHOLESTEROL, DIRECT: LDL DIRECT: 145 mg/dL

## 2017-05-29 NOTE — Assessment & Plan Note (Signed)
Increase sertraline to 100 mg daily.  Follow-up in 4 weeks.  Return precautions reviewed.

## 2017-05-29 NOTE — Telephone Encounter (Signed)
Okay for verbal 

## 2017-05-29 NOTE — Assessment & Plan Note (Signed)
Appears to be well-healing today.  Dressing changed today.  Continue dressing changes with wound care.  Follow-up with surgery next month.

## 2017-05-29 NOTE — Telephone Encounter (Signed)
Verbal order is ok.  Algis Greenhouse. [name], MD 05/29/2017 1:08 PM

## 2017-05-29 NOTE — Assessment & Plan Note (Signed)
No active abscesses today.  Discussed measures to reduce recurrence including better glycemic control and hygiene.  Start Hibiclens.  Consider Clindagel in the future.  Return precautions reviewed.

## 2017-05-29 NOTE — Telephone Encounter (Signed)
Need verbal order for Physical Therapy.  Ty,  -LL

## 2017-05-29 NOTE — Assessment & Plan Note (Signed)
Continue low-dose aspirin.  Check lipid panel today.

## 2017-05-29 NOTE — Assessment & Plan Note (Signed)
Proceed with patient titrated increase of Lantus.  Instructed patient to increase Lantus by 1 unit every morning his fasting sugar is greater than 125.  Continue Trulicity 1.5 mg weekly.  Follow-up in 1 month.  Repeat A1c at that time.  Consider standing dose of NovoLog if A1c still elevated with controlled fasting sugars.

## 2017-05-29 NOTE — Assessment & Plan Note (Signed)
Check viral load and CD4 counts today.

## 2017-05-29 NOTE — Assessment & Plan Note (Signed)
Start nystatin powder.

## 2017-06-01 ENCOUNTER — Other Ambulatory Visit: Payer: Self-pay

## 2017-06-01 LAB — LIPID PANEL: LDL Cholesterol: 57

## 2017-06-01 LAB — HIV-1 RNA QUANT-NO REFLEX-BLD
HIV 1 RNA Quant: <20 copies/mL
HIV-1 RNA Quant, Log: <1.3 Log copies/mL

## 2017-06-01 LAB — T-HELPER CELLS (CD4) COUNT (NOT AT ARMC)
ABSOLUTE CD4: 776 {cells}/uL (ref 490–1740)
CD4 T HELPER %: 35 % (ref 30–61)
TOTAL LYMPHOCYTE COUNT: 2227 {cells}/uL (ref 850–3900)

## 2017-06-01 MED ORDER — ATORVASTATIN CALCIUM 40 MG PO TABS: 40.0000 mg | ORAL_TABLET | Freq: Every day | ORAL | 2 refills | Status: DC

## 2017-06-01 NOTE — Telephone Encounter (Signed)
OK to provide verbal orders for OT as well?

## 2017-06-01 NOTE — Telephone Encounter (Signed)
Called [facility] and provided verbal order.

## 2017-06-01 NOTE — Telephone Encounter (Signed)
Verbal orders for occupational therapy?  Needs to be seen 1x a week for 1 wk  AND  2x a week for 5 weeks.  Ty,  -LL

## 2017-06-02 ENCOUNTER — Telehealth: Payer: Self-pay | Admitting: Family Medicine

## 2017-06-02 MED ORDER — HYDROCHLOROTHIAZIDE 25 MG PO TABS: 25.0000 mg | ORAL_TABLET | Freq: Every day | ORAL | 1 refills | Status: DC

## 2017-06-02 NOTE — Telephone Encounter (Signed)
Labs faxed

## 2017-06-02 NOTE — Telephone Encounter (Signed)
Medication sent in for patient. 

## 2017-06-02 NOTE — Telephone Encounter (Signed)
Verbal orders are ok.  Algis Greenhouse. [name], MD 06/02/2017 9:04 AM

## 2017-06-02 NOTE — Telephone Encounter (Signed)
Verbal orders given  

## 2017-06-02 NOTE — Telephone Encounter (Signed)
Need lab results faxed to [phone].  ATTN: Alyse Low [facility])  Ty,  -LL

## 2017-06-02 NOTE — Telephone Encounter (Signed)
MEDICATION: hydrochlorothiazide (HYDRODIURIL) 25 MG tablet [[id number]]    PHARMACY:   [place], [place] [phone] (Phone) [phone] (Fax)    IS THIS A 90 DAY SUPPLY : Yes  IS PATIENT OUT OF MEDICATION: yes IF NOT; HOW MUCH IS LEFT:   LAST APPOINTMENT DATE: @10 /19/2018  NEXT APPOINTMENT DATE:@11 /16/2018  OTHER COMMENTS:    **Let patient know to contact pharmacy at the end of the day to make sure medication is ready. **  ** Please notify patient to allow 48-72 hours to process**  **Encourage patient to contact the pharmacy for refills or they can request refills through [facility]**

## 2017-06-05 ENCOUNTER — Telehealth: Payer: Self-pay | Admitting: Family Medicine

## 2017-06-05 NOTE — Telephone Encounter (Signed)
Patient should stop both for 2 weeks.  He should restart the lipitor at a half dose - 20mg  daily at that time.  If his muscle pain does not improve after stopping medications, or if it recurs after restarting the lower dose of lipitor he needs to be seen.  Algis Greenhouse. [name], MD 06/05/2017 3:56 PM

## 2017-06-05 NOTE — Telephone Encounter (Signed)
Please review

## 2017-06-05 NOTE — Telephone Encounter (Signed)
Kellie from [facility] called to advise that the patient began his atorvastatin (LIPITOR) 40 MG tablet last night and there was an interaction with his fenofibrate 160 MG tablet that caused myopathy.   Call [phone] to confirm receiving the message per [facility].

## 2017-06-05 NOTE — Telephone Encounter (Signed)
Nurse is concerned about pts wound, she feels that using Dakin solution for several weeks will not be good for the wound. Do you know when his surgery appt is by chance?

## 2017-06-05 NOTE — Telephone Encounter (Signed)
I only know that it is sometime in November.  Algis Greenhouse. [name], MD 06/05/2017 5:11 PM

## 2017-06-05 NOTE — Telephone Encounter (Signed)
Message was error. Pt is not complaining of any pain. They just want to make sure he can take both. I told her it was fine and to let us know if anything changes.

## 2017-06-10 ENCOUNTER — Telehealth: Payer: Self-pay | Admitting: Family Medicine

## 2017-06-10 NOTE — Telephone Encounter (Signed)
[place] with me.  Algis Greenhouse. [name], MD 06/10/2017 11:45 AM

## 2017-06-10 NOTE — Telephone Encounter (Signed)
Spoke with Tiffany from home health. She is going to find out when patient is has his surgery appt  and call back.

## 2017-06-10 NOTE — Telephone Encounter (Signed)
Verbally gave Tiffany the orders.

## 2017-06-10 NOTE — Telephone Encounter (Signed)
Okay for verbal order

## 2017-06-10 NOTE — Telephone Encounter (Signed)
Tiffany needs verbal order for silver gel for wound on left scrotum

## 2017-06-15 ENCOUNTER — Encounter: Payer: Self-pay | Admitting: Family Medicine

## 2017-06-23 ENCOUNTER — Encounter: Payer: Self-pay | Admitting: Family Medicine

## 2017-06-23 ENCOUNTER — Ambulatory Visit: Payer: BLUE CROSS/BLUE SHIELD | Admitting: Family Medicine

## 2017-06-23 DIAGNOSIS — I10 Essential (primary) hypertension: Secondary | ICD-10-CM | POA: Diagnosis not present

## 2017-06-23 DIAGNOSIS — E785 Hyperlipidemia, unspecified: Secondary | ICD-10-CM | POA: Diagnosis not present

## 2017-06-23 DIAGNOSIS — F419 Anxiety disorder, unspecified: Secondary | ICD-10-CM

## 2017-06-23 DIAGNOSIS — E1159 Type 2 diabetes mellitus with other circulatory complications: Secondary | ICD-10-CM

## 2017-06-23 DIAGNOSIS — E1322 Other specified diabetes mellitus with diabetic chronic kidney disease: Secondary | ICD-10-CM

## 2017-06-23 DIAGNOSIS — E1169 Type 2 diabetes mellitus with other specified complication: Secondary | ICD-10-CM

## 2017-06-23 DIAGNOSIS — L732 Hidradenitis suppurativa: Secondary | ICD-10-CM

## 2017-06-23 DIAGNOSIS — F329 Major depressive disorder, single episode, unspecified: Secondary | ICD-10-CM

## 2017-06-23 DIAGNOSIS — N183 Chronic kidney disease, stage 3 (moderate): Secondary | ICD-10-CM | POA: Diagnosis not present

## 2017-06-23 DIAGNOSIS — I152 Hypertension secondary to endocrine disorders: Secondary | ICD-10-CM

## 2017-06-23 MED ORDER — TRULICITY 1.5 MG/0.5ML ~~LOC~~ SOAJ: 1.5000 mg | SUBCUTANEOUS | 1 refills | Status: DC

## 2017-06-23 MED ORDER — CHLORHEXIDINE GLUCONATE 4 % EX LIQD: Freq: Every day | CUTANEOUS | 11 refills | Status: DC | PRN

## 2017-06-23 NOTE — Progress Notes (Signed)
    Subjective:  Francisco Hutchinson is a 46 y.o. male who presents today with a chief complaint of type 2 diabetes follow-up.   HPI:  DIABETES Type II, Chronic, Stable Medications: trulicity 1.5mg  weekly, lantus 36 units daily, Reports taking and tolerating without side effects. Blood Sugars per patient: Fasting: 210s, Interim History- Patient seen about a month ago. Was told to titrate lantus by 1 unit to achieve fasting sugars less than 125.  Patient and caregiver only went up to Lantus 36 units.  He has done well at this dose.  No obvious side effects.  ROS: Denies Polyuria,Polydipsia, Denies  Hypoglycemia symptoms (palpitations, tremors, anxiousness)   Hyperlipidemia, new problem Noted on last screening blood panel.  Started Lipitor 40 mg daily without side effects.  Anxiety/depression, chronic problem, improving At last visit was increased to sertraline 100 mg from 50 mg.  He has done well on this dose without any side effects.  Feels like his mood is much improved.  Hidradenitis, chronic problem, stable Patient was prescribed Hibiclens at his last office visit.  He has not yet started this.  No current areas concerning for abscess.  Hypertension, Chronic Problem, Stable BP Readings from Last 3 Encounters:  06/23/17 120/90  05/28/17 110/82  04/23/17 118/79   Current Medications: Norvasc 5 mg daily, HCTZ 25 mg daily, compliant without side effects.  ROS: Denies any chest pain, shortness of breath, dyspnea on exertion, leg edema.   ROS: Per HPI  PMH: Smoking history reviewed. Never smoker.   Objective:  Physical Exam: BP 120/90   Pulse 83   SpO2 99%   Gen: NAD, resting comfortably CV: RRR with no murmurs appreciated Pulm: NWOB, CTAB with no crackles, wheezes, or rhonchi  Assessment/Plan:  Secondary diabetes mellitus with stage 3 chronic kidney disease (HCC) Increase Lantus to 40 units daily.  Again, advised to increase by 1 unit each daily to goal fasting blood  sugar 125 or less.  Given patient's weight he will likely end up in the 50 range for adequate fasting blood sugar control.  Once fasting sugars are well controlled, would consider addition of standing NovoLog vs. daily GLP-1 agonist instead of Trulicity.  Repeat A1c at next office visit.  Due for foot exam, urine microalbumin, and pneumonia shot.  Will address at next office visit.  Anxiety and depression Improved on sertraline 100 mg daily.  Continue at his current dose.  Hidradenitis Hibiclens sent back in.  Hyperlipidemia associated with type 2 diabetes mellitus ([place]) Doing well on atorvastatin and fenofibrate.  Continue this regimen.  Recheck lipid panel or direct LDL in about 6 months.  Hypertension associated with diabetes ([place]) At goal.  Continue amlodipine and HCTZ.  Algis Greenhouse. [name], MD 06/24/2017 12:22 PM

## 2017-06-23 NOTE — Patient Instructions (Signed)
Increase the lantus to 40 units daily. Increase by unit each day until fasting sugars are 125 or less.  No other changes today.  Come back in 1 month.  Take care,  Dr [name]

## 2017-06-24 ENCOUNTER — Encounter: Payer: Self-pay | Admitting: Family Medicine

## 2017-06-24 DIAGNOSIS — I10 Essential (primary) hypertension: Secondary | ICD-10-CM

## 2017-06-24 DIAGNOSIS — E1169 Type 2 diabetes mellitus with other specified complication: Secondary | ICD-10-CM | POA: Insufficient documentation

## 2017-06-24 DIAGNOSIS — E1159 Type 2 diabetes mellitus with other circulatory complications: Secondary | ICD-10-CM | POA: Insufficient documentation

## 2017-06-24 DIAGNOSIS — E785 Hyperlipidemia, unspecified: Secondary | ICD-10-CM

## 2017-06-24 NOTE — Assessment & Plan Note (Addendum)
Increase Lantus to 40 units daily.  Again, advised to increase by 1 unit each daily to goal fasting blood sugar 125 or less.  Given patient's weight he will likely end up in the 50 range for adequate fasting blood sugar control.  Once fasting sugars are well controlled, would consider addition of standing NovoLog vs. daily GLP-1 agonist instead of Trulicity.  Repeat A1c at next office visit.  Due for foot exam, urine microalbumin, and pneumonia shot.  Will address at next office visit.

## 2017-06-24 NOTE — Assessment & Plan Note (Signed)
Hibiclens sent back in.

## 2017-06-24 NOTE — Assessment & Plan Note (Signed)
Improved on sertraline 100 mg daily.  Continue at his current dose.

## 2017-06-24 NOTE — Assessment & Plan Note (Signed)
Doing well on atorvastatin and fenofibrate.  Continue this regimen.  Recheck lipid panel or direct LDL in about 6 months.

## 2017-06-24 NOTE — Assessment & Plan Note (Signed)
At goal.  Continue amlodipine and HCTZ.

## 2017-06-26 ENCOUNTER — Ambulatory Visit: Payer: BLUE CROSS/BLUE SHIELD | Admitting: Family Medicine

## 2017-07-08 ENCOUNTER — Other Ambulatory Visit: Payer: Self-pay | Admitting: *Deleted

## 2017-07-08 ENCOUNTER — Telehealth: Payer: Self-pay | Admitting: Family Medicine

## 2017-07-08 MED ORDER — AMLODIPINE BESYLATE 5 MG PO TABS: 5.0000 mg | ORAL_TABLET | Freq: Every day | ORAL | 6 refills | Status: DC

## 2017-07-08 NOTE — Telephone Encounter (Signed)
Copied from [place] [id number]. Topic: Quick Communication - See Telephone Encounter >> Jul 08, 2017 10:00 AM Synthia Innocent wrote: CRM for notification. See Telephone encounter for: Requesting refill on amLODipine (NORVASC) 5 MG tablet   07/08/17.

## 2017-07-08 NOTE — Telephone Encounter (Signed)
Last appt 11/13- next appt 12/13  Refill given

## 2017-07-13 ENCOUNTER — Encounter: Payer: Self-pay | Admitting: Family Medicine

## 2017-07-16 ENCOUNTER — Telehealth: Payer: Self-pay | Admitting: Family Medicine

## 2017-07-16 NOTE — Telephone Encounter (Signed)
Copied from [place]. Topic: Quick Communication - See Telephone Encounter >> Jul 16, 2017 11:01 AM Boyd Kerbs wrote: CRM for notification. See Telephone encounter for:  Francisco Hutchinson called saying the doctor had them increase the dosage by a couple each day.  They are at 47 units per day.  Will need a new prescription for 50 a day Lantas pen . 3 month script, please  [facility] on [address] [name] 07/16/17.

## 2017-07-17 ENCOUNTER — Other Ambulatory Visit: Payer: Self-pay

## 2017-07-17 ENCOUNTER — Telehealth: Payer: Self-pay

## 2017-07-17 MED ORDER — LANTUS SOLOSTAR 100 UNIT/ML ~~LOC~~ SOPN: 50.0000 [IU] | PEN_INJECTOR | Freq: Every day | SUBCUTANEOUS | 3 refills | Status: DC

## 2017-07-17 NOTE — Telephone Encounter (Signed)
Francisco Hutchinson Best calling to check on refill of Lantus for the pt. Notified that refill had been sent to [facility] as requested. Understanding verbalized.

## 2017-07-17 NOTE — Telephone Encounter (Signed)
Looks like medication was sent into the pharmacy.

## 2017-07-17 NOTE — Telephone Encounter (Signed)
Copied from [place]. Topic: Quick Communication - See Telephone Encounter >> Jul 16, 2017 11:01 AM Boyd Kerbs wrote: CRM for notification. See Telephone encounter for:  Francisco Hutchinson called saying the doctor had them increase the dosage by a couple each day.  They are at 47 units per day.  Will need a new prescription for 50 a day Lantas pen . 3 month script, please  [facility] on [address] [name] 07/16/17. >> Jul 17, 2017  3:21 PM Darl Householder, RMA wrote: Pt called to check status of medication refill for Lantus pen, please call pt

## 2017-07-17 NOTE — Telephone Encounter (Signed)
Copied from [place]. Topic: Quick Communication - See Telephone Encounter >> Jul 16, 2017 11:01 AM Boyd Kerbs wrote: CRM for notification. See Telephone encounter for:  Gwendlyn Deutscher called saying the doctor had them increase the dosage by a couple each day.  They are at 47 units per day.  Will need a new prescription for 50 a day Lantas pen . 3 month script, please  [facility] on [address] [name] 07/16/17. >> Jul 17, 2017  3:21 PM Darl Householder, RMA wrote: Pt called to check status of medication refill for Lantus pen, please call pt

## 2017-07-17 NOTE — Telephone Encounter (Signed)
Refill sent to pharmacy.   

## 2017-07-23 ENCOUNTER — Ambulatory Visit: Payer: BLUE CROSS/BLUE SHIELD | Admitting: Family Medicine

## 2017-07-30 ENCOUNTER — Ambulatory Visit: Payer: BLUE CROSS/BLUE SHIELD | Admitting: Family Medicine

## 2017-07-30 ENCOUNTER — Encounter: Payer: Self-pay | Admitting: Family Medicine

## 2017-07-30 DIAGNOSIS — E1122 Type 2 diabetes mellitus with diabetic chronic kidney disease: Secondary | ICD-10-CM | POA: Diagnosis not present

## 2017-07-30 DIAGNOSIS — L732 Hidradenitis suppurativa: Secondary | ICD-10-CM

## 2017-07-30 DIAGNOSIS — F324 Major depressive disorder, single episode, in partial remission: Secondary | ICD-10-CM | POA: Insufficient documentation

## 2017-07-30 DIAGNOSIS — E785 Hyperlipidemia, unspecified: Secondary | ICD-10-CM

## 2017-07-30 DIAGNOSIS — E1159 Type 2 diabetes mellitus with other circulatory complications: Secondary | ICD-10-CM

## 2017-07-30 DIAGNOSIS — Z794 Long term (current) use of insulin: Secondary | ICD-10-CM

## 2017-07-30 DIAGNOSIS — I10 Essential (primary) hypertension: Secondary | ICD-10-CM

## 2017-07-30 DIAGNOSIS — E1169 Type 2 diabetes mellitus with other specified complication: Secondary | ICD-10-CM | POA: Diagnosis not present

## 2017-07-30 MED ORDER — DOXYCYCLINE HYCLATE 100 MG PO TABS: 100.0000 mg | ORAL_TABLET | Freq: Two times a day (BID) | ORAL | 0 refills | Status: DC

## 2017-07-30 MED ORDER — SERTRALINE HCL 100 MG PO TABS: 100.0000 mg | ORAL_TABLET | Freq: Every day | ORAL | 11 refills | Status: DC

## 2017-07-30 NOTE — Progress Notes (Signed)
    Subjective:  Francisco Hutchinson is a 46 y.o. male who presents today with a chief complaint of type 2 diabetes.   HPI:  DIABETES Type II, established problem, Stable Medications: Trulicity 1.5 mg weekly, Lantus 47 units daily, Reports taking and tolerating without side effects. Blood Sugars per patient: Fasting sugars in the 120s.-300s.  Low of 87. Interim history: Has done well since her last visit.  No somatic highs or lows.  Doing well with his current medication regimen.  ROS: Denies Polyuria,Polydipsia, Denies  Hypoglycemia symptoms (palpitations, tremors, anxiousness)   Depression/Anxiety, established problem, Stable Doing well on sertraline 100 mg daily.  Requests refill today.  ROS: No SI or HI.  Hidradenitis, established problem, stable Patient with 2 new lesions today.  One in his left axilla.  The other is in his groin.  Symptoms started a couple weeks ago.  Seem to be overall improving since then.  No fevers or chills.  No treatments tried.  ROS: Per HPI  PMH: Smoking history reviewed.  Never smoker.   Objective:  Physical Exam: BP 118/78 (BP Location: Left Arm, Patient Position: Sitting, Cuff Size: Normal)   Pulse 78   SpO2 96%   Gen: NAD, resting comfortably CV: RRR with no murmurs appreciated Pulm: NWOB, CTAB with no crackles, wheezes, or rhonchi Skin: 1cm indurated in left axilla with any fluctance or drainage. 2cm drainage abscess in right groin without mild amount of surrounding erythema.   A1c: 8.4  Assessment/Plan:  Type 2 diabetes mellitus with diabetic chronic kidney disease (HCC) A1c improved 8.4 today.  We will increase his Lantus to 50 units daily.  Continue current dose Trulicity.  Instructed patient to continue titrating Lantus to goal fasting blood sugars less than 125.  Follow-up in 3 months for repeat A1c.  Hypertension associated with diabetes ([place]) At goal.  Continue HCTZ, metoprolol, and amlodipine.  Depression, major, single  episode, in partial remission ([place]) Doing well.  Sertraline refilled today.  Hyperlipidemia associated with type 2 diabetes mellitus (HCC) A1c improved 8.4 today.  Increase Lantus to 50 units daily.  Continue Trulicity.  Follow-up in 3 months.  Hidradenitis No lesions amenable to I&D today.  No signs of systemic illness.  Given his history of necrotizing fasciitis and overall possible immunocompromise state, I sent in a prescription for doxycycline with strict instruction to start if symptoms do not continue to improve or if they worsen over the next couple of days.  Additionally advised patient to start Hibiclens to help reduce recurrence of abscess formation.   Francisco Hutchinson. Francisco Pain, MD 07/30/2017 5:26 PM

## 2017-07-30 NOTE — Assessment & Plan Note (Signed)
A1c improved 8.4 today.  We will increase his Lantus to 50 units daily.  Continue current dose Trulicity.  Instructed patient to continue titrating Lantus to goal fasting blood sugars less than 125.  Follow-up in 3 months for repeat A1c.

## 2017-07-30 NOTE — Patient Instructions (Signed)
Increase lantus to 50 units daily.  I sent in a 100mg  tablet for your sertraline  Please start the doxycycline if your swelling does not improve in the next few days.  No other changes today.  Come back to see me in 3 months, or sooner as needed.  Take care, Dr [name]

## 2017-07-30 NOTE — Assessment & Plan Note (Signed)
No lesions amenable to I&D today.  No signs of systemic illness.  Given his history of necrotizing fasciitis and overall possible immunocompromise state, I sent in a prescription for doxycycline with strict instruction to start if symptoms do not continue to improve or if they worsen over the next couple of days.  Additionally advised patient to start Hibiclens to help reduce recurrence of abscess formation.

## 2017-07-30 NOTE — Assessment & Plan Note (Signed)
At goal.  Continue HCTZ, metoprolol, and amlodipine.

## 2017-07-30 NOTE — Assessment & Plan Note (Signed)
Doing well.  Sertraline refilled today.

## 2017-07-30 NOTE — Assessment & Plan Note (Signed)
A1c improved 8.4 today.  Increase Lantus to 50 units daily.  Continue Trulicity.  Follow-up in 3 months.

## 2017-07-31 ENCOUNTER — Telehealth: Payer: Self-pay | Admitting: Family Medicine

## 2017-07-31 ENCOUNTER — Other Ambulatory Visit: Payer: Self-pay

## 2017-07-31 MED ORDER — SERTRALINE HCL 100 MG PO TABS: 100.0000 mg | ORAL_TABLET | Freq: Every day | ORAL | 11 refills | Status: DC

## 2017-07-31 NOTE — Telephone Encounter (Signed)
Patient should be on 100 mg total daily.  He should be taking one 100 mg tablet daily.  Rx sent to pharmacy.

## 2017-07-31 NOTE — Telephone Encounter (Signed)
Copied from [place] [phone]. Topic: Quick Communication - See Telephone Encounter >> Jul 31, 2017 10:13 AM Percell Belt A wrote: CRM for notification. See Telephone encounter for: Gwendlyn Deutscher called in and said that his sertraline (ZOLOFT) 100 MG tablet [[id number]]  was increased to 2 pill daily and it was sent in for 1 abd 1/2.  He said that he needs a new script called in because it will not be enough for the month   [facility]   07/31/17.

## 2017-08-09 ENCOUNTER — Other Ambulatory Visit: Payer: Self-pay | Admitting: Family Medicine

## 2017-08-17 ENCOUNTER — Encounter: Payer: Self-pay | Admitting: Family Medicine

## 2017-08-18 ENCOUNTER — Telehealth: Payer: Self-pay | Admitting: Family Medicine

## 2017-08-18 NOTE — Telephone Encounter (Signed)
Copied from [place] [phone]. Topic: Inquiry >> Aug 18, 2017 12:29 PM Vernona Rieger wrote: Alyse Low from [facility] with the infectious disease clinic wants to know of any recent labs and if he has had a flu shot. She will be seeing the patient on Thursday & doesn't wanna repeat something he may have already had done. Her call back is [phone]   Fax number is [phone]

## 2017-08-18 NOTE — Telephone Encounter (Signed)
See note

## 2017-08-19 NOTE — Telephone Encounter (Signed)
LM on Francisco Hutchinson's voicemail letting her know that last labs drawn were in October.  Asked her to call back and let me know if she wants those faxed to her.  Also, patient had flu shot in October.

## 2017-08-28 ENCOUNTER — Encounter: Payer: Self-pay | Admitting: Family Medicine

## 2017-08-31 ENCOUNTER — Encounter: Payer: Self-pay | Admitting: Family Medicine

## 2017-08-31 HISTORY — PX: PARS PLANA VITRECTOMY 27 GAUGE WITH 25 GAUGE PORT: SHX6740

## 2017-09-01 ENCOUNTER — Other Ambulatory Visit: Payer: Self-pay | Admitting: Family Medicine

## 2017-09-01 ENCOUNTER — Encounter: Payer: Self-pay | Admitting: Family Medicine

## 2017-09-02 ENCOUNTER — Telehealth: Payer: Self-pay | Admitting: Family Medicine

## 2017-09-02 NOTE — Telephone Encounter (Signed)
Copied from [place]. Topic: General - Other >> Sep 02, 2017  1:29 PM Darl Householder, RMA wrote: Reason for CRM: Medication refill request for fenofibrate 160 mg to be sent to [facility] , [place]

## 2017-09-03 ENCOUNTER — Other Ambulatory Visit: Payer: Self-pay

## 2017-09-03 MED ORDER — FENOFIBRATE 160 MG PO TABS: 80.0000 mg | ORAL_TABLET | Freq: Every day | ORAL | 0 refills | Status: DC

## 2017-09-03 NOTE — Telephone Encounter (Signed)
Ok with me. Please place any necessary orders. 

## 2017-09-03 NOTE — Telephone Encounter (Signed)
Rx sent to patient's pharmacy

## 2017-09-15 ENCOUNTER — Encounter: Payer: Self-pay | Admitting: Family Medicine

## 2017-09-23 ENCOUNTER — Encounter: Payer: Self-pay | Admitting: Family Medicine

## 2017-09-24 ENCOUNTER — Encounter: Payer: Self-pay | Admitting: Family Medicine

## 2017-09-24 ENCOUNTER — Ambulatory Visit: Payer: BLUE CROSS/BLUE SHIELD | Admitting: Family Medicine

## 2017-09-24 VITALS — BP 124/74 | HR 65

## 2017-09-24 DIAGNOSIS — E1122 Type 2 diabetes mellitus with diabetic chronic kidney disease: Secondary | ICD-10-CM

## 2017-09-24 DIAGNOSIS — Z794 Long term (current) use of insulin: Secondary | ICD-10-CM

## 2017-09-24 LAB — POCT GLYCOSYLATED HEMOGLOBIN (HGB A1C): Hemoglobin A1C: 10.8

## 2017-09-24 MED ORDER — DOXYCYCLINE HYCLATE 100 MG PO TABS: 100.0000 mg | ORAL_TABLET | Freq: Two times a day (BID) | ORAL | 0 refills | Status: DC

## 2017-09-24 NOTE — Patient Instructions (Addendum)
Start the doxycycline.  Please let me know if your symptoms are not improving over the next couple days or if your symptoms worsen.  Please let me also know if you start having any sort of fevers chills or muscle aches.  We will not make any changes to your diabetes medications today.  It is very important that we get your sugar under good control to prevent recurrent infections.  Please come back to see me in the next several weeks to recheck your sugar and discuss medication changes.  Take care, Dr. [name]

## 2017-09-24 NOTE — Progress Notes (Signed)
    Subjective:  Francisco Hutchinson is a 47 y.o. male who presents today with a chief complaint of abscess.   HPI:  Abscess, acute issue Started yesterday.  Located in his left inguinal area.  Area is very tender to palpation.  Thinks that it may have started draining over the last several hours.  No home treatments tried.  No fevers or chills.  Has a history of recurrent abscess formation in that area as well as a recent bout of necrotizing fasciitis last year.  No obvious precipitating events.  No other obvious alleviating or aggravating factors.  Type 2 diabetes, established problem, worsening Current medications include Trulicity 1.5 mg once weekly and Lantus 50 units daily.  He is compliant with both these medications without side effects.  Sugars have been highly variable over the last several weeks.  High of 328.  Level of 78.  No hypoglycemic symptoms with a low CBG.  Fasting sugars usually in the 140s-220s range.  ROS: Per HPI  Objective:  Physical Exam: BP 124/74 (BP Location: Left Arm, Patient Position: Sitting, Cuff Size: Normal)   Pulse 65   SpO2 91%   Gen: NAD, resting comfortably GU: Approximately 2 x 3 cm area of induration on left medial gluteus area with central ulceration and small amount of discharge.  Surrounding erythema noted.  Area is tender to palpation.  No fluctuance noted.  Results for orders placed or performed in visit on 09/24/17 (from the past 24 hour(s))  POCT glycosylated hemoglobin (Hb A1C)     Status: None   Collection Time: 09/24/17  3:24 PM  Result Value Ref Range   Hemoglobin A1C 10.8    Assessment/Plan:  Type 2 diabetes mellitus with diabetic chronic kidney disease (HCC) A1c elevated to 10.8 today.  Likely elevated in the setting of dietary indiscretion as well as acute infection.  Discussed importance of good glycemic control with regards to recurrent infections and wound healing. Family deferred making medication adjustments today. They will  follow up after his infection clears to discuss further management.   Abscess No areas amenable to I&D today.  No signs of systemic illness.  Given his complicated history including HIV and history of necrotizing fasciitis, we will treat with 2-week course of doxycycline.  Strict return precautions reviewed.  If continues to have recurrent hidradenitis, may benefit from referral to plastic surgery.  Algis Greenhouse. [name], MD 09/24/2017 4:20 PM

## 2017-09-24 NOTE — Assessment & Plan Note (Signed)
A1c elevated to 10.8 today.  Likely elevated in the setting of dietary indiscretion as well as acute infection.  Discussed importance of good glycemic control with regards to recurrent infections and wound healing. Family deferred making medication adjustments today. They will follow up after his infection clears to discuss further management.

## 2017-09-28 ENCOUNTER — Telehealth: Payer: Self-pay | Admitting: Family Medicine

## 2017-09-28 ENCOUNTER — Encounter: Payer: Self-pay | Admitting: Family Medicine

## 2017-09-28 ENCOUNTER — Ambulatory Visit: Payer: BLUE CROSS/BLUE SHIELD | Admitting: Family Medicine

## 2017-09-28 VITALS — BP 100/72 | HR 67 | Temp 97.8°F

## 2017-09-28 DIAGNOSIS — L0231 Cutaneous abscess of buttock: Secondary | ICD-10-CM | POA: Diagnosis not present

## 2017-09-28 MED ORDER — TRAMADOL HCL 50 MG PO TABS: 50.0000 mg | ORAL_TABLET | Freq: Four times a day (QID) | ORAL | 0 refills | Status: DC | PRN

## 2017-09-28 MED ORDER — SULFAMETHOXAZOLE-TRIMETHOPRIM 800-160 MG PO TABS: 1.0000 | ORAL_TABLET | Freq: Two times a day (BID) | ORAL | 0 refills | Status: DC

## 2017-09-28 NOTE — Patient Instructions (Signed)
Abscess- this does appear to have gotten larger since valentine's day but is now improving after rupturing yesterday. I would use warm compresses at least 4x a day to see if we can promote further drainage/rupture.   I do not see any area that I can do an incision and drainage today to help the area drain more- it would likely take deep exploration and even then it may just be the inflamed area that has now drained.   Lets monitor for now and schedule a repeat check with Dr. [name] either late in the week or early next week. If area worsens- start the printed antibiotic and see [place] back immediately.

## 2017-09-28 NOTE — Telephone Encounter (Signed)
Please advise as soon as possible.

## 2017-09-28 NOTE — Telephone Encounter (Signed)
Pt will be seeing Dr Yong Channel later today.  Francisco Hutchinson. [name], MD 09/28/2017 11:43 AM

## 2017-09-28 NOTE — Telephone Encounter (Signed)
Copied from [place]. >> Sep 28, 2017  9:52 AM Francisco Hutchinson wrote: Pt's mother called saying her son's cyst has gotten bigger and worse. She was told to call Dr. [name] if it gets bigger. Pt has been taking the Rx Dr. [name] prescribed.  They are not helping. She wants to know if she needs to take him to surgeon.   Pleae call her asap to know what needs to be done.

## 2017-09-28 NOTE — Telephone Encounter (Signed)
Copied from [place]. Topic: Inquiry >> Sep 28, 2017  9:52 AM Neva Seat wrote: Pt's mother called saying her son's cyst has gotten bigger and worse. She was told to call Dr. [name] if it gets bigger. Pt has been taking the Rx Dr. [name] prescribed.  They are not helping. She wants to know if she needs to take him to surgeon.   Pleae call her asap to know what needs to be done.

## 2017-09-28 NOTE — Telephone Encounter (Signed)
Please advise 

## 2017-09-28 NOTE — Progress Notes (Signed)
Subjective:  Francisco Hutchinson is a 47 y.o. year old very pleasant male patient who presents for/with See problem oriented charting ROS- no fever or chills. Had some worsening redness and pain in left buttocks that improved after rupture last night.    Past Medical History-  Patient Active Problem List   Diagnosis Date Noted  . Depression, major, single episode, in partial remission ([place]) 07/30/2017  . Hyperlipidemia associated with type 2 diabetes mellitus ([place]) 06/24/2017  . Hypertension associated with diabetes ([place]) 06/24/2017  . Hidradenitis 05/29/2017  . [place] intertrigo 05/29/2017  . Stroke ([place])   . HIV disease ([place])   . CKD (chronic kidney disease), stage III ([place]) 03/25/2017  . Type 2 diabetes mellitus with diabetic chronic kidney disease ([place]) 03/25/2017  . Necrotizing fasciitis ([place]) 03/23/2017    Medications- reviewed and updated Current Outpatient Medications  Medication Sig Dispense Refill  . acetaminophen (TYLENOL) 325 MG tablet Take 2 tablets (650 mg total) by mouth every 6 (six) hours as needed for mild pain, fever or headache (or Fever >/= 101).    Marland Kitchen amLODipine (NORVASC) 5 MG tablet Take 1 tablet (5 mg total) by mouth daily. 30 tablet 6  . aspirin EC 81 MG tablet Take 81 mg by mouth at bedtime.    Marland Kitchen atorvastatin (LIPITOR) 40 MG tablet TAKE 1 TABLET BY MOUTH ONCE DAILY 30 tablet 2  . chlorhexidine (HIBICLENS) 4 % external liquid Apply daily as needed topically. 120 mL 11  . DESCOVY 200-25 MG tablet Take 1 tablet by mouth daily.  11  . doxycycline (VIBRA-TABS) 100 MG tablet Take 1 tablet (100 mg total) by mouth 2 (two) times daily. 28 tablet 0  . fenofibrate 160 MG tablet Take 0.5 tablets (80 mg total) by mouth at bedtime. 90 tablet 0  . Ferrous Gluconate (IRON 27 PO) Take one tablet daily.    . hydrocerin (EUCERIN) CREA Apply 1 application topically 2 (two) times daily.  0  . hydrochlorothiazide (HYDRODIURIL) 25 MG tablet Take 1 tablet (25 mg total) by mouth  daily. 90 tablet 1  . LANTUS SOLOSTAR 100 UNIT/ML Solostar Pen Inject 50 Units into the skin at bedtime. 45 mL 3  . metoprolol tartrate (LOPRESSOR) 100 MG tablet Take 100 mg by mouth 2 (two) times daily.   0  . nystatin (MYCOSTATIN/NYSTOP) powder Apply topically 4 (four) times daily. 15 g 11  . sertraline (ZOLOFT) 100 MG tablet Take 1 tablet (100 mg total) by mouth daily. (Patient taking differently: Take 200 mg by mouth daily. ) 30 tablet 11  . TIVICAY 50 MG tablet Take 50 mg by mouth at bedtime.   11  . TRULICITY 1.5 FB/5.1WC SOPN INJECT 1.5MG  SUBCUTANEOUSLY ONCE A WEEK ON SUNDAY 4 pen 1   No current facility-administered medications for this visit.     Objective: BP 100/72 (BP Location: Left Arm, Patient Position: Sitting, Cuff Size: Large)   Pulse 67   Temp 97.8 F (36.6 C) (Oral)   SpO2 97%  Gen: NAD, resting comfortably CV: RRR  Lungs: nonlabored breathing, normal respiratory rate Ext: some edema Skin: warm, dry. On left buttocks. 3x4 indurated area but minimal warmth - some erythema. Moderately tender in this area. No fluctuance.  Neuro: grossly normal, moves all extremities   Assessment/Plan:  Abscess of left buttock S: patient seen for abscess last week on left buttocks. No fluctuant area to I+D. Placed on doxycycline 14 days- high risk patient with HIV (though undetectable viral load), high a1c at 10.8,  wheelchair bound, history necrotizing fasciitis.   Today, states area was getting worse until last night when it ruptured and now pain is finally improving. With rupture, he wanted to make sure everything looked ok today. Has continued to have light amount of purulent drainage today.  A/P: Patient is very high risk with HIV, history of necrotizing fasciitis. Appears after rupture last night that the abscess is improving. I advised him to continue doxycycline. If he fails to continue to improve advised him to take bactrim (written rx today) immediatley and then get into see [place]  as soon as possible (additional MRSA coverage). No obvious signs of necrotizing fasciitis at this time or need for added strep coverage. Also advised follow up within a week. At end of visit due to patient sitting right on area- asked for something for pain control and we provided a short supply of tramadol.   Future Appointments  Date Time Provider [place]  10/08/2017  2:00 PM [name], MD [facility]  10/29/2017 11:20 AM [name], MD [facility]    Meds ordered this encounter  Medications  . sulfamethoxazole-trimethoprim (BACTRIM DS,SEPTRA DS) 800-160 MG tablet    Sig: Take 1 tablet by mouth 2 (two) times daily.    Dispense:  14 tablet    Refill:  0  . traMADol (ULTRAM) 50 MG tablet    Sig: Take 1 tablet (50 mg total) by mouth every 6 (six) hours as needed for moderate pain or severe pain.    Dispense:  20 tablet    Refill:  0    Return precautions advised.  Garret Reddish, MD

## 2017-10-02 ENCOUNTER — Other Ambulatory Visit: Payer: Self-pay | Admitting: Family Medicine

## 2017-10-05 ENCOUNTER — Other Ambulatory Visit: Payer: Self-pay | Admitting: Family Medicine

## 2017-10-08 ENCOUNTER — Telehealth: Payer: Self-pay | Admitting: Family Medicine

## 2017-10-08 ENCOUNTER — Ambulatory Visit: Payer: BLUE CROSS/BLUE SHIELD | Admitting: Family Medicine

## 2017-10-08 NOTE — Telephone Encounter (Signed)
Copied from [place]. Topic: Inquiry >> Oct 08, 2017 10:54 AM Oliver Pila B wrote: Reason for CRM: pt called and cancelled appt b/c pt states cyst is healing but did ask for another Rx of anitibiotics, pt did not give name of antibiotic, contact pt to advise

## 2017-10-08 NOTE — Telephone Encounter (Signed)
Patient called again to request if the prescription request for doxycycline (VIBRA-TABS) 100 MG tablet could be filled today because his insurance changes tonight at 12am and the new insurance does not cover as much as the one he presently have.

## 2017-10-08 NOTE — Telephone Encounter (Signed)
Patient called again very upset because the prescription was not written and now his insurance changes at midnight and he has to pay money for the prescription.

## 2017-10-08 NOTE — Telephone Encounter (Signed)
See note

## 2017-10-09 ENCOUNTER — Other Ambulatory Visit: Payer: Self-pay

## 2017-10-09 MED ORDER — DOXYCYCLINE HYCLATE 100 MG PO TABS: 100.0000 mg | ORAL_TABLET | Freq: Two times a day (BID) | ORAL | 0 refills | Status: DC

## 2017-10-09 NOTE — Telephone Encounter (Signed)
Per Dr. [name], ok to send in.  Rx was sent to the pharmacy.

## 2017-10-12 ENCOUNTER — Encounter: Payer: Self-pay | Admitting: Family Medicine

## 2017-10-12 DIAGNOSIS — E113513 Type 2 diabetes mellitus with proliferative diabetic retinopathy with macular edema, bilateral: Secondary | ICD-10-CM | POA: Diagnosis not present

## 2017-10-29 ENCOUNTER — Ambulatory Visit: Payer: BLUE CROSS/BLUE SHIELD | Admitting: Family Medicine

## 2017-10-29 ENCOUNTER — Other Ambulatory Visit: Payer: Self-pay | Admitting: Infectious Diseases

## 2017-11-03 ENCOUNTER — Ambulatory Visit (INDEPENDENT_AMBULATORY_CARE_PROVIDER_SITE_OTHER): Payer: PPO | Admitting: Family Medicine

## 2017-11-03 ENCOUNTER — Encounter: Payer: Self-pay | Admitting: Family Medicine

## 2017-11-03 DIAGNOSIS — I10 Essential (primary) hypertension: Secondary | ICD-10-CM | POA: Diagnosis not present

## 2017-11-03 DIAGNOSIS — Z794 Long term (current) use of insulin: Secondary | ICD-10-CM | POA: Diagnosis not present

## 2017-11-03 DIAGNOSIS — E1159 Type 2 diabetes mellitus with other circulatory complications: Secondary | ICD-10-CM | POA: Diagnosis not present

## 2017-11-03 DIAGNOSIS — L732 Hidradenitis suppurativa: Secondary | ICD-10-CM

## 2017-11-03 DIAGNOSIS — E1122 Type 2 diabetes mellitus with diabetic chronic kidney disease: Secondary | ICD-10-CM

## 2017-11-03 NOTE — Assessment & Plan Note (Signed)
Fasting blood sugars much improved.  Patient had A1c done about 6 weeks ago-we will not repeat today.  Given that his fasting sugars are at goal, we will not make any changes today.  Continue Lantus 50 units daily and Trulicity 1.5 mg weekly.  He will follow-up with me in 2 to 3 months.  Check A1c at that time.  Titrate dose of Lantus as needed.

## 2017-11-03 NOTE — Assessment & Plan Note (Signed)
At goal.  Continue HCTZ, metoprolol, and amlodipine.

## 2017-11-03 NOTE — Patient Instructions (Signed)
No changes today.  Please come back in 2-3 months to recheck your A1c, or sooner as needed.  Take care, Dr [name]

## 2017-11-03 NOTE — Assessment & Plan Note (Signed)
No signs of active infection today.  Symptoms seem to have resolved compared to last visit..  We will continue with watchful waiting.

## 2017-11-03 NOTE — Progress Notes (Signed)
   Subjective:  Francisco Hutchinson is a 47 y.o. male who presents today with a chief complaint of T2DM.   HPI:  T2DM, established problem, stable Current regimen of Lantus 50 units daily and Trulicity 1.5 mg weekly.  Sugars have been well controlled over the past several weeks.  Reports fasting sugars in the 120s 130s.  No hypoglycemic symptoms.  A little increased thirst.  No polyuria.  Had eye exam performed last month.  Hypertension, established problem, stable Current medications include amlodipine 5 mg daily, HCTZ 25 mg daily, and metoprolol tartrate 100 mg twice daily lantus 50 units daily.  Tolerates all his medications without side effects.  Cutaneous abscess/hidradenitis Patient seen about a month ago was given a course of Bactrim.  Needed an additional course of doxycycline as well.  Symptoms have significantly improved since then.  No further abscesses.  No fevers or chills.  ROS: Per HPI  PMH: He reports that he has never smoked. He has never used smokeless tobacco. He reports that he drinks about 1.2 oz of alcohol per week. He reports that he does not use drugs.  Objective:  Physical Exam: BP 102/62 (BP Location: Left Arm)   Pulse 85   Temp (!) 97.2 F (36.2 C)   Wt 234 lb (106.1 kg)   SpO2 95%   BMI 32.64 kg/m   Gen: NAD, resting comfortably CV: RRR with no murmurs appreciated Pulm: NWOB, CTAB with no crackles, wheezes, or rhonchi MSK: Diabetic foot exam performed today. See flowsheet. Diabetic Skin: No signs of infection in left axilla.   Assessment/Plan:  Type 2 diabetes mellitus with diabetic chronic kidney disease ([place]) Fasting blood sugars much improved.  Patient had A1c done about 6 weeks ago-we will not repeat today.  Given that his fasting sugars are at goal, we will not make any changes today.  Continue Lantus 50 units daily and Trulicity 1.5 mg weekly.  He will follow-up with me in 2 to 3 months.  Check A1c at that time.  Titrate dose of Lantus as  needed.  Hypertension associated with diabetes ([place]) At goal.  Continue HCTZ, metoprolol, and amlodipine.  Hidradenitis No signs of active infection today.  Symptoms seem to have resolved compared to last visit..  We will continue with watchful waiting.  Algis Greenhouse. [name], MD 11/03/2017 2:08 PM

## 2017-11-13 ENCOUNTER — Encounter: Payer: Self-pay | Admitting: Physical Therapy

## 2017-11-16 ENCOUNTER — Telehealth: Payer: Self-pay | Admitting: Family Medicine

## 2017-11-16 ENCOUNTER — Ambulatory Visit: Payer: Self-pay | Admitting: *Deleted

## 2017-11-16 NOTE — Telephone Encounter (Signed)
Attempted to call Patient  Back x  3  Messages  Were  Left  No  Answer

## 2017-11-16 NOTE — Telephone Encounter (Signed)
LOV:11/03/17  Dr. Agustina Caroli in [place]

## 2017-11-16 NOTE — Telephone Encounter (Signed)
Copied from [place] [id number]. Topic: Quick Communication - Rx Refill/Question >> Nov 16, 2017  6:23 PM Marin Olp L wrote: Medication: doxycycline Has the patient contacted their pharmacy? Yes.   (Agent: If no, request that the patient contact the pharmacy for the refill.) Preferred Pharmacy (with phone number or street name): [facility] [address] [place] [address] Phone: [phone] Fax: [phone] Agent: Please be advised that RX refills may take up to 3 business days. We ask that you follow-up with your pharmacy.

## 2017-11-17 NOTE — Telephone Encounter (Signed)
OK to do-

## 2017-11-17 NOTE — Telephone Encounter (Signed)
Need more info.  Is this for his hidradenitis flare or something else?  Algis Greenhouse. [name], MD 11/17/2017 3:11 PM

## 2017-11-17 NOTE — Telephone Encounter (Signed)
LVMOM for pt to call back with information

## 2017-11-18 ENCOUNTER — Ambulatory Visit: Payer: Self-pay

## 2017-11-18 ENCOUNTER — Other Ambulatory Visit: Payer: Self-pay

## 2017-11-18 MED ORDER — DOXYCYCLINE HYCLATE 100 MG PO TABS: 100.0000 mg | ORAL_TABLET | Freq: Two times a day (BID) | ORAL | 0 refills | Status: DC

## 2017-11-18 NOTE — Telephone Encounter (Signed)
Sent in rx.

## 2017-11-18 NOTE — Telephone Encounter (Signed)
Ok to refill today. Needs to see me soon if not improving.  Francisco Hutchinson. [name], MD 11/18/2017 12:08 PM

## 2017-11-18 NOTE — Telephone Encounter (Signed)
See message.

## 2017-11-18 NOTE — Telephone Encounter (Signed)
Phone call to pt. due to previous call from pt's mother with report of cyst.  Pt. Reported onset of cyst right groin about 1.5 weeks ago.  Stated it gradually increased with swelling and tenderness.  Reported it burst on Sunday night.  Stated the drainage was pus-like, and now is clear.  Reported there is some residual swelling and redness, but that it feels better than it did.  Denied fever.  Advised that due to hx of Hidradenitis, will send message to Dr. [name], to make aware of his symptoms.  Pt. advised to expect call, from office, with further recommendations by Dr. [name].  Verb. understanding.    Reason for Disposition . [1] Small swelling or lump AND [2] unexplained AND [3] present < 1 week  Answer Assessment - Initial Assessment Questions 1. APPEARANCE of SWELLING: "What does it look like?" (e.g., lymph node, insect bite, mole)     Right groin cyst was swelled, red, and fluid filled 2. SIZE: "How large is the swelling?" (inches, cm or compare to coins)     Swelling has improved since the cyst burst on Sunday night 3. LOCATION: "Where is the swelling located?"     Right  4. ONSET: "When did the swelling start?"     About 1.5 weeks 5. PAIN: "Is it painful?" If so, ask: "How much?"     moderate 6. ITCH: "Does it itch?" If so, ask: "How much?"     Denied itching 7. CAUSE: "What do you think caused the swelling?"     Cyst like before. Hx of Hidradenitis  8. OTHER SYMPTOMS: "Do you have any other symptoms?" (e.g., fever)     Clear drainage that is drying up; some residual swelling and redness; no fever  Protocols used: SKIN LUMP OR LOCALIZED SWELLING-A-AH

## 2017-12-10 DIAGNOSIS — E113511 Type 2 diabetes mellitus with proliferative diabetic retinopathy with macular edema, right eye: Secondary | ICD-10-CM | POA: Diagnosis not present

## 2017-12-10 DIAGNOSIS — E113512 Type 2 diabetes mellitus with proliferative diabetic retinopathy with macular edema, left eye: Secondary | ICD-10-CM | POA: Diagnosis not present

## 2017-12-10 DIAGNOSIS — E113513 Type 2 diabetes mellitus with proliferative diabetic retinopathy with macular edema, bilateral: Secondary | ICD-10-CM | POA: Diagnosis not present

## 2017-12-29 ENCOUNTER — Other Ambulatory Visit: Payer: Self-pay

## 2017-12-29 ENCOUNTER — Other Ambulatory Visit: Payer: Self-pay | Admitting: Family Medicine

## 2017-12-29 MED ORDER — ACETAMINOPHEN 325 MG PO TABS: 650.0000 mg | ORAL_TABLET | Freq: Four times a day (QID) | ORAL | Status: AC | PRN

## 2017-12-29 MED ORDER — TRULICITY 1.5 MG/0.5ML ~~LOC~~ SOAJ: SUBCUTANEOUS | 1 refills | Status: DC

## 2018-01-05 ENCOUNTER — Encounter: Payer: Self-pay | Admitting: Family Medicine

## 2018-01-05 ENCOUNTER — Ambulatory Visit (INDEPENDENT_AMBULATORY_CARE_PROVIDER_SITE_OTHER): Payer: PPO | Admitting: Family Medicine

## 2018-01-05 VITALS — BP 116/72 | HR 86 | Temp 98.2°F | Ht 71.0 in | Wt 234.0 lb

## 2018-01-05 DIAGNOSIS — E1122 Type 2 diabetes mellitus with diabetic chronic kidney disease: Secondary | ICD-10-CM

## 2018-01-05 DIAGNOSIS — B351 Tinea unguium: Secondary | ICD-10-CM | POA: Diagnosis not present

## 2018-01-05 DIAGNOSIS — I10 Essential (primary) hypertension: Secondary | ICD-10-CM

## 2018-01-05 DIAGNOSIS — E1159 Type 2 diabetes mellitus with other circulatory complications: Secondary | ICD-10-CM | POA: Diagnosis not present

## 2018-01-05 DIAGNOSIS — S92331A Displaced fracture of third metatarsal bone, right foot, initial encounter for closed fracture: Secondary | ICD-10-CM | POA: Diagnosis not present

## 2018-01-05 DIAGNOSIS — S90811A Abrasion, right foot, initial encounter: Secondary | ICD-10-CM

## 2018-01-05 LAB — POCT GLYCOSYLATED HEMOGLOBIN (HGB A1C): HEMOGLOBIN A1C: 11.3 % — AB (ref 4.0–5.6)

## 2018-01-05 MED ORDER — LANTUS SOLOSTAR 100 UNIT/ML ~~LOC~~ SOPN: 55.0000 [IU] | PEN_INJECTOR | Freq: Every day | SUBCUTANEOUS | 3 refills | Status: DC

## 2018-01-05 NOTE — Assessment & Plan Note (Signed)
Patient's A1c uncontrolled.  11.3 today.  We will increase his Lantus to 55 units daily.  Instructed patient to increase dose of Lantus by 1 unit each day that his sugars are more than 125.  Discussed importance of good glycemic control regarding his overall health.  He will follow-up with me in 3 months.  If continues to be uncontrolled, will likely need addition of mealtime insulin and/or referral to endocrinology.

## 2018-01-05 NOTE — Assessment & Plan Note (Signed)
Stable.  Continue current medications.

## 2018-01-05 NOTE — Patient Instructions (Signed)
Was very nice to see you today!  We will increase your Lantus to 55 units nightly.  Please check your blood sugar every morning.  Every day that your blood sugar is more than 125, please increase your Lantus by 1 unit.  We will refer you to the podiatrist today.  I will see you back in about 3 months, or sooner as needed.  Take care, Dr [name]

## 2018-01-05 NOTE — Progress Notes (Signed)
    Subjective:  Francisco Hutchinson is a 47 y.o. male who presents today with a chief complaint of T2DM.   HPI:  T2DM, chronic problem Patient currently on Lantus 50 units daily and Trulicity 1.5 mg weekly.  He has been compliant with both these medications without any reported side effects.  Fasting sugars have been "up-and-down".  Sugars usually in the 150s to 180s however is found a few in the 200s.  Usually does not take postprandial or preprandial sugars.  No reported hypoglycemic symptoms.  Hypertension, chronic problem, stable Currently on amlodipine 5 mg daily, HCTZ 25 mg daily, and metoprolol 100 mg twice daily.  Tolerates all these well without side effects.  Nail deformity, new problem Patient with bilateral no deformities.  Also noted discoloration of his left great toenail over the last couple weeks.  No obvious precipitating events.  No treatments tried.  Right foot abrasion, new problem Patient with abrasion to the medial aspect of his right foot a few hours before his office visit.  States that he picked a scab off the area and has had difficulty getting the bleeding under control.  ROS: Per HPI  PMH: He reports that he has never smoked. He has never used smokeless tobacco. He reports that he drinks about 1.2 oz of alcohol per week. He reports that he does not use drugs.  Objective:  Physical Exam: BP 116/72 (BP Location: Left Arm, Patient Position: Sitting, Cuff Size: Normal)   Pulse 86   Temp 98.2 F (36.8 C) (Oral)   Ht 5\' 11"  (1.803 m)   Wt 234 lb (106.1 kg)   SpO2 96%   BMI 32.64 kg/m   Gen: NAD, resting comfortably CV: RRR with no murmurs appreciated Pulm: NWOB, CTAB with no crackles, wheezes, or rhonchi MSK: Hammertoe deformity bilaterally.  Left great toe with subungual hematoma.  Onychomycosis in bilateral toenails. Skin: Lower extremities with diffuse xerosis.  Small 1 cm abrasion to the medial aspect of right foot.  Results for orders placed or  performed in visit on 01/05/18 (from the past 24 hour(s))  POCT glycosylated hemoglobin (Hb A1C)     Status: Abnormal   Collection Time: 01/05/18  1:44 PM  Result Value Ref Range   Hemoglobin A1C 11.3 (A) 4.0 - 5.6 %   HbA1c, POC (prediabetic range)  5.7 - 6.4 %   HbA1c, POC (controlled diabetic range)  0.0 - 7.0 %   Assessment/Plan:  Type 2 diabetes mellitus with diabetic chronic kidney disease ([place]) Patient's A1c uncontrolled.  11.3 today.  We will increase his Lantus to 55 units daily.  Instructed patient to increase dose of Lantus by 1 unit each day that his sugars are more than 125.  Discussed importance of good glycemic control regarding his overall health.  He will follow-up with me in 3 months.  If continues to be uncontrolled, will likely need addition of mealtime insulin and/or referral to endocrinology.  Hypertension associated with diabetes ([place]) Stable. Continue current medications.    Onychomycosis/subungual hematoma Referral placed to podiatry.  Right foot abrasion Bandaged today.  Advised patient to avoid skin picking.  Algis Greenhouse. [name], MD 01/05/2018 3:23 PM

## 2018-01-15 DIAGNOSIS — R5383 Other fatigue: Secondary | ICD-10-CM | POA: Diagnosis not present

## 2018-01-15 DIAGNOSIS — M726 Necrotizing fasciitis: Secondary | ICD-10-CM | POA: Diagnosis not present

## 2018-01-15 DIAGNOSIS — R509 Fever, unspecified: Secondary | ICD-10-CM | POA: Diagnosis not present

## 2018-01-15 DIAGNOSIS — R112 Nausea with vomiting, unspecified: Secondary | ICD-10-CM | POA: Diagnosis not present

## 2018-01-18 ENCOUNTER — Encounter: Payer: Self-pay | Admitting: Family Medicine

## 2018-01-18 ENCOUNTER — Ambulatory Visit (INDEPENDENT_AMBULATORY_CARE_PROVIDER_SITE_OTHER): Payer: PPO | Admitting: Family Medicine

## 2018-01-18 ENCOUNTER — Other Ambulatory Visit: Payer: Self-pay

## 2018-01-18 ENCOUNTER — Ambulatory Visit: Payer: PPO | Admitting: Family Medicine

## 2018-01-18 ENCOUNTER — Inpatient Hospital Stay (HOSPITAL_COMMUNITY)
Admission: EM | Admit: 2018-01-18 | Discharge: 2018-01-25 | DRG: 239 | Disposition: A | Discharge status: Skilled Nursing Facility | Payer: PPO | Attending: Internal Medicine | Admitting: Internal Medicine

## 2018-01-18 ENCOUNTER — Encounter (HOSPITAL_COMMUNITY): Payer: Self-pay

## 2018-01-18 ENCOUNTER — Emergency Department (HOSPITAL_COMMUNITY): Payer: PPO

## 2018-01-18 VITALS — BP 116/68 | HR 71 | Temp 100.5°F | Ht 71.0 in

## 2018-01-18 DIAGNOSIS — A419 Sepsis, unspecified organism: Secondary | ICD-10-CM | POA: Diagnosis not present

## 2018-01-18 DIAGNOSIS — F419 Anxiety disorder, unspecified: Secondary | ICD-10-CM | POA: Diagnosis present

## 2018-01-18 DIAGNOSIS — B9735 Human immunodeficiency virus, type 2 [HIV 2] as the cause of diseases classified elsewhere: Secondary | ICD-10-CM | POA: Diagnosis present

## 2018-01-18 DIAGNOSIS — Z7982 Long term (current) use of aspirin: Secondary | ICD-10-CM

## 2018-01-18 DIAGNOSIS — Z833 Family history of diabetes mellitus: Secondary | ICD-10-CM | POA: Diagnosis not present

## 2018-01-18 DIAGNOSIS — Z7401 Bed confinement status: Secondary | ICD-10-CM | POA: Diagnosis not present

## 2018-01-18 DIAGNOSIS — E1142 Type 2 diabetes mellitus with diabetic polyneuropathy: Secondary | ICD-10-CM | POA: Diagnosis not present

## 2018-01-18 DIAGNOSIS — E876 Hypokalemia: Secondary | ICD-10-CM | POA: Diagnosis present

## 2018-01-18 DIAGNOSIS — D631 Anemia in chronic kidney disease: Secondary | ICD-10-CM | POA: Diagnosis not present

## 2018-01-18 DIAGNOSIS — I96 Gangrene, not elsewhere classified: Secondary | ICD-10-CM | POA: Diagnosis not present

## 2018-01-18 DIAGNOSIS — I10 Essential (primary) hypertension: Secondary | ICD-10-CM

## 2018-01-18 DIAGNOSIS — R9431 Abnormal electrocardiogram [ECG] [EKG]: Secondary | ICD-10-CM | POA: Diagnosis not present

## 2018-01-18 DIAGNOSIS — B20 Human immunodeficiency virus [HIV] disease: Secondary | ICD-10-CM | POA: Diagnosis not present

## 2018-01-18 DIAGNOSIS — Z741 Need for assistance with personal care: Secondary | ICD-10-CM | POA: Diagnosis not present

## 2018-01-18 DIAGNOSIS — E43 Unspecified severe protein-calorie malnutrition: Secondary | ICD-10-CM | POA: Diagnosis present

## 2018-01-18 DIAGNOSIS — E441 Mild protein-calorie malnutrition: Secondary | ICD-10-CM | POA: Diagnosis not present

## 2018-01-18 DIAGNOSIS — M7989 Other specified soft tissue disorders: Secondary | ICD-10-CM | POA: Diagnosis not present

## 2018-01-18 DIAGNOSIS — Z872 Personal history of diseases of the skin and subcutaneous tissue: Secondary | ICD-10-CM | POA: Diagnosis not present

## 2018-01-18 DIAGNOSIS — L732 Hidradenitis suppurativa: Secondary | ICD-10-CM | POA: Diagnosis not present

## 2018-01-18 DIAGNOSIS — M6281 Muscle weakness (generalized): Secondary | ICD-10-CM | POA: Diagnosis not present

## 2018-01-18 DIAGNOSIS — R112 Nausea with vomiting, unspecified: Secondary | ICD-10-CM

## 2018-01-18 DIAGNOSIS — Z8673 Personal history of transient ischemic attack (TIA), and cerebral infarction without residual deficits: Secondary | ICD-10-CM | POA: Diagnosis not present

## 2018-01-18 DIAGNOSIS — Z801 Family history of malignant neoplasm of trachea, bronchus and lung: Secondary | ICD-10-CM

## 2018-01-18 DIAGNOSIS — M726 Necrotizing fasciitis: Secondary | ICD-10-CM | POA: Diagnosis not present

## 2018-01-18 DIAGNOSIS — L97319 Non-pressure chronic ulcer of right ankle with unspecified severity: Secondary | ICD-10-CM | POA: Diagnosis not present

## 2018-01-18 DIAGNOSIS — F329 Major depressive disorder, single episode, unspecified: Secondary | ICD-10-CM | POA: Diagnosis not present

## 2018-01-18 DIAGNOSIS — Z79899 Other long term (current) drug therapy: Secondary | ICD-10-CM

## 2018-01-18 DIAGNOSIS — F324 Major depressive disorder, single episode, in partial remission: Secondary | ICD-10-CM | POA: Diagnosis not present

## 2018-01-18 DIAGNOSIS — I129 Hypertensive chronic kidney disease with stage 1 through stage 4 chronic kidney disease, or unspecified chronic kidney disease: Secondary | ICD-10-CM | POA: Diagnosis present

## 2018-01-18 DIAGNOSIS — E785 Hyperlipidemia, unspecified: Secondary | ICD-10-CM | POA: Diagnosis present

## 2018-01-18 DIAGNOSIS — Z794 Long term (current) use of insulin: Secondary | ICD-10-CM | POA: Diagnosis not present

## 2018-01-18 DIAGNOSIS — N183 Chronic kidney disease, stage 3 unspecified: Secondary | ICD-10-CM | POA: Diagnosis present

## 2018-01-18 DIAGNOSIS — E1122 Type 2 diabetes mellitus with diabetic chronic kidney disease: Secondary | ICD-10-CM | POA: Diagnosis present

## 2018-01-18 DIAGNOSIS — Z89511 Acquired absence of right leg below knee: Secondary | ICD-10-CM | POA: Diagnosis not present

## 2018-01-18 DIAGNOSIS — E11649 Type 2 diabetes mellitus with hypoglycemia without coma: Secondary | ICD-10-CM | POA: Diagnosis not present

## 2018-01-18 DIAGNOSIS — Z8249 Family history of ischemic heart disease and other diseases of the circulatory system: Secondary | ICD-10-CM | POA: Diagnosis not present

## 2018-01-18 DIAGNOSIS — E1159 Type 2 diabetes mellitus with other circulatory complications: Secondary | ICD-10-CM

## 2018-01-18 DIAGNOSIS — IMO0002 Reserved for concepts with insufficient information to code with codable children: Secondary | ICD-10-CM

## 2018-01-18 DIAGNOSIS — R278 Other lack of coordination: Secondary | ICD-10-CM | POA: Diagnosis not present

## 2018-01-18 DIAGNOSIS — E119 Type 2 diabetes mellitus without complications: Secondary | ICD-10-CM | POA: Diagnosis not present

## 2018-01-18 DIAGNOSIS — B372 Candidiasis of skin and nail: Secondary | ICD-10-CM | POA: Diagnosis not present

## 2018-01-18 DIAGNOSIS — L97519 Non-pressure chronic ulcer of other part of right foot with unspecified severity: Secondary | ICD-10-CM | POA: Diagnosis not present

## 2018-01-18 DIAGNOSIS — I152 Hypertension secondary to endocrine disorders: Secondary | ICD-10-CM | POA: Diagnosis present

## 2018-01-18 DIAGNOSIS — E1165 Type 2 diabetes mellitus with hyperglycemia: Secondary | ICD-10-CM | POA: Diagnosis not present

## 2018-01-18 DIAGNOSIS — R000 Tachycardia, unspecified: Secondary | ICD-10-CM | POA: Diagnosis not present

## 2018-01-18 DIAGNOSIS — N189 Chronic kidney disease, unspecified: Secondary | ICD-10-CM | POA: Diagnosis not present

## 2018-01-18 DIAGNOSIS — R262 Difficulty in walking, not elsewhere classified: Secondary | ICD-10-CM | POA: Diagnosis not present

## 2018-01-18 DIAGNOSIS — R0902 Hypoxemia: Secondary | ICD-10-CM | POA: Diagnosis not present

## 2018-01-18 DIAGNOSIS — M2550 Pain in unspecified joint: Secondary | ICD-10-CM | POA: Diagnosis not present

## 2018-01-18 DIAGNOSIS — I70261 Atherosclerosis of native arteries of extremities with gangrene, right leg: Secondary | ICD-10-CM | POA: Diagnosis not present

## 2018-01-18 DIAGNOSIS — E1152 Type 2 diabetes mellitus with diabetic peripheral angiopathy with gangrene: Secondary | ICD-10-CM | POA: Diagnosis not present

## 2018-01-18 DIAGNOSIS — I517 Cardiomegaly: Secondary | ICD-10-CM | POA: Diagnosis not present

## 2018-01-18 DIAGNOSIS — N179 Acute kidney failure, unspecified: Secondary | ICD-10-CM | POA: Diagnosis present

## 2018-01-18 LAB — CBC WITH DIFFERENTIAL/PLATELET
BASOS PCT: 0 %
Basophils Absolute: 0 10*3/uL (ref 0.0–0.1)
EOS ABS: 0 10*3/uL (ref 0.0–0.7)
EOS PCT: 0 %
HCT: 33.3 % — ABNORMAL LOW (ref 39.0–52.0)
Hemoglobin: 11.4 g/dL — ABNORMAL LOW (ref 13.0–17.0)
LYMPHS PCT: 2 %
Lymphs Abs: 0.6 10*3/uL — ABNORMAL LOW (ref 0.7–4.0)
MCH: 30.6 pg (ref 26.0–34.0)
MCHC: 34.2 g/dL (ref 30.0–36.0)
MCV: 89.3 fL (ref 78.0–100.0)
Monocytes Absolute: 0.9 10*3/uL (ref 0.1–1.0)
Monocytes Relative: 3 %
Neutro Abs: 29.8 10*3/uL — ABNORMAL HIGH (ref 1.7–7.7)
Neutrophils Relative %: 95 %
PLATELETS: 439 10*3/uL — AB (ref 150–400)
RBC: 3.73 MIL/uL — AB (ref 4.22–5.81)
RDW: 13.6 % (ref 11.5–15.5)
WBC: 31.3 10*3/uL — AB (ref 4.0–10.5)

## 2018-01-18 LAB — COMPREHENSIVE METABOLIC PANEL
ALT: 18 U/L (ref 17–63)
ANION GAP: 14 (ref 5–15)
AST: 33 U/L (ref 15–41)
Albumin: 2.2 g/dL — ABNORMAL LOW (ref 3.5–5.0)
Alkaline Phosphatase: 122 U/L (ref 38–126)
BUN: 33 mg/dL — ABNORMAL HIGH (ref 6–20)
CALCIUM: 8.5 mg/dL — AB (ref 8.9–10.3)
CHLORIDE: 91 mmol/L — AB (ref 101–111)
CO2: 28 mmol/L (ref 22–32)
Creatinine, Ser: 2.19 mg/dL — ABNORMAL HIGH (ref 0.61–1.24)
GFR calc Af Amer: 40 mL/min — ABNORMAL LOW (ref >60)
GFR, EST NON AFRICAN AMERICAN: 35 mL/min — AB (ref >60)
Glucose, Bld: 107 mg/dL — ABNORMAL HIGH (ref 65–99)
Potassium: 3.2 mmol/L — ABNORMAL LOW (ref 3.5–5.1)
SODIUM: 133 mmol/L — AB (ref 135–145)
Total Bilirubin: 0.9 mg/dL (ref 0.3–1.2)
Total Protein: 8.4 g/dL — ABNORMAL HIGH (ref 6.5–8.1)

## 2018-01-18 LAB — URINALYSIS, ROUTINE W REFLEX MICROSCOPIC
BILIRUBIN URINE: NEGATIVE
GLUCOSE, UA: 50 mg/dL — AB
Ketones, ur: NEGATIVE mg/dL
LEUKOCYTES UA: NEGATIVE
NITRITE: NEGATIVE
PH: 5 (ref 5.0–8.0)
Protein, ur: 100 mg/dL — AB
SPECIFIC GRAVITY, URINE: 1.021 (ref 1.005–1.030)

## 2018-01-18 LAB — I-STAT CG4 LACTIC ACID, ED: LACTIC ACID, VENOUS: 1.42 mmol/L (ref 0.5–1.9)

## 2018-01-18 LAB — CBG MONITORING, ED: GLUCOSE-CAPILLARY: 122 mg/dL — AB (ref 65–99)

## 2018-01-18 MED ORDER — SERTRALINE HCL 50 MG PO TABS
75.0000 mg | ORAL_TABLET | Freq: Every day | ORAL | Status: DC
  Administered 2018-01-18 – 2018-01-24 (×7): 75 mg via ORAL
  Filled 2018-01-18 (×8): qty 1

## 2018-01-18 MED ORDER — PIPERACILLIN-TAZOBACTAM 3.375 G IVPB 30 MIN
3.3750 g | Freq: Once | INTRAVENOUS | Status: AC
  Administered 2018-01-18: 3.375 g via INTRAVENOUS
  Filled 2018-01-18: qty 50

## 2018-01-18 MED ORDER — CLINDAMYCIN PHOSPHATE 600 MG/50ML IV SOLN
600.0000 mg | Freq: Three times a day (TID) | INTRAVENOUS | Status: DC
  Administered 2018-01-18 – 2018-01-20 (×5): 600 mg via INTRAVENOUS
  Filled 2018-01-18 (×6): qty 50

## 2018-01-18 MED ORDER — INSULIN ASPART 100 UNIT/ML ~~LOC~~ SOLN
0.0000 [IU] | Freq: Three times a day (TID) | SUBCUTANEOUS | Status: DC
  Administered 2018-01-21: 2 [IU] via SUBCUTANEOUS
  Administered 2018-01-21: 3 [IU] via SUBCUTANEOUS
  Administered 2018-01-21: 5 [IU] via SUBCUTANEOUS
  Administered 2018-01-22: 2 [IU] via SUBCUTANEOUS
  Administered 2018-01-22: 1 [IU] via SUBCUTANEOUS
  Administered 2018-01-23: 5 [IU] via SUBCUTANEOUS
  Administered 2018-01-23: 7 [IU] via SUBCUTANEOUS
  Administered 2018-01-23 – 2018-01-24 (×2): 5 [IU] via SUBCUTANEOUS
  Administered 2018-01-24 (×2): 3 [IU] via SUBCUTANEOUS
  Administered 2018-01-25: 5 [IU] via SUBCUTANEOUS
  Administered 2018-01-25 (×2): 3 [IU] via SUBCUTANEOUS

## 2018-01-18 MED ORDER — VANCOMYCIN HCL IN DEXTROSE 1-5 GM/200ML-% IV SOLN: 1000.0000 mg | Freq: Once | INTRAVENOUS | Status: DC

## 2018-01-18 MED ORDER — ONDANSETRON HCL 4 MG/2ML IJ SOLN
4.0000 mg | Freq: Four times a day (QID) | INTRAMUSCULAR | Status: DC | PRN
  Administered 2018-01-22: 4 mg via INTRAVENOUS
  Filled 2018-01-18: qty 2

## 2018-01-18 MED ORDER — ONDANSETRON HCL 4 MG/2ML IJ SOLN
8.0000 mg | Freq: Once | INTRAMUSCULAR | Status: AC
  Administered 2018-01-18: 8 mg via INTRAMUSCULAR

## 2018-01-18 MED ORDER — ONDANSETRON HCL 4 MG PO TABS: 4.0000 mg | ORAL_TABLET | Freq: Four times a day (QID) | ORAL | Status: DC | PRN

## 2018-01-18 MED ORDER — DOLUTEGRAVIR SODIUM 50 MG PO TABS
50.0000 mg | ORAL_TABLET | Freq: Every day | ORAL | Status: DC
  Administered 2018-01-19 – 2018-01-25 (×7): 50 mg via ORAL
  Filled 2018-01-18 (×7): qty 1

## 2018-01-18 MED ORDER — METOPROLOL TARTRATE 100 MG PO TABS
100.0000 mg | ORAL_TABLET | Freq: Two times a day (BID) | ORAL | Status: DC
  Administered 2018-01-18 – 2018-01-25 (×14): 100 mg via ORAL
  Filled 2018-01-18 (×2): qty 1
  Filled 2018-01-18: qty 4
  Filled 2018-01-18 (×11): qty 1

## 2018-01-18 MED ORDER — SODIUM CHLORIDE 0.9 % IV SOLN
1250.0000 mg | INTRAVENOUS | Status: DC
  Administered 2018-01-19 – 2018-01-24 (×5): 1250 mg via INTRAVENOUS
  Filled 2018-01-18 (×7): qty 1250

## 2018-01-18 MED ORDER — EMTRICITABINE-TENOFOVIR AF 200-25 MG PO TABS
1.0000 | ORAL_TABLET | Freq: Every day | ORAL | Status: DC
  Administered 2018-01-18 – 2018-01-24 (×7): 1 via ORAL
  Filled 2018-01-18 (×8): qty 1

## 2018-01-18 MED ORDER — SERTRALINE HCL 100 MG PO TABS
100.0000 mg | ORAL_TABLET | Freq: Every day | ORAL | Status: DC
  Administered 2018-01-19 – 2018-01-24 (×6): 100 mg via ORAL
  Filled 2018-01-18 (×7): qty 1

## 2018-01-18 MED ORDER — ACETAMINOPHEN 650 MG RE SUPP: 650.0000 mg | Freq: Four times a day (QID) | RECTAL | Status: DC | PRN

## 2018-01-18 MED ORDER — CEFTRIAXONE SODIUM 1 G IJ SOLR
1.0000 g | Freq: Once | INTRAMUSCULAR | Status: AC
  Administered 2018-01-18: 1 g via INTRAMUSCULAR

## 2018-01-18 MED ORDER — FERROUS GLUCONATE 324 (38 FE) MG PO TABS
324.0000 mg | ORAL_TABLET | Freq: Every day | ORAL | Status: DC
  Administered 2018-01-19 – 2018-01-25 (×7): 324 mg via ORAL
  Filled 2018-01-18 (×8): qty 1

## 2018-01-18 MED ORDER — FENOFIBRATE 160 MG PO TABS
80.0000 mg | ORAL_TABLET | Freq: Every day | ORAL | Status: DC
  Administered 2018-01-18: 80 mg via ORAL
  Filled 2018-01-18 (×2): qty 0.5

## 2018-01-18 MED ORDER — SODIUM CHLORIDE 0.9 % IV BOLUS
1000.0000 mL | Freq: Once | INTRAVENOUS | Status: AC
  Administered 2018-01-18: 1000 mL via INTRAVENOUS

## 2018-01-18 MED ORDER — INSULIN GLARGINE 100 UNIT/ML ~~LOC~~ SOLN
20.0000 [IU] | Freq: Every day | SUBCUTANEOUS | Status: DC
Start: 2018-01-18 — End: 2018-01-19
  Administered 2018-01-18: 20 [IU] via SUBCUTANEOUS
  Filled 2018-01-18: qty 0.2

## 2018-01-18 MED ORDER — AMLODIPINE BESYLATE 5 MG PO TABS: 5.0000 mg | ORAL_TABLET | Freq: Every day | ORAL | Status: DC

## 2018-01-18 MED ORDER — VANCOMYCIN HCL 10 G IV SOLR
2000.0000 mg | Freq: Once | INTRAVENOUS | Status: AC
  Administered 2018-01-18: 2000 mg via INTRAVENOUS
  Filled 2018-01-18: qty 2000

## 2018-01-18 MED ORDER — ACETAMINOPHEN 325 MG PO TABS: 650.0000 mg | ORAL_TABLET | Freq: Four times a day (QID) | ORAL | Status: DC | PRN

## 2018-01-18 MED ORDER — ATORVASTATIN CALCIUM 40 MG PO TABS
40.0000 mg | ORAL_TABLET | Freq: Every day | ORAL | Status: DC
  Administered 2018-01-19 – 2018-01-25 (×7): 40 mg via ORAL
  Filled 2018-01-18 (×7): qty 1

## 2018-01-18 MED ORDER — PIPERACILLIN-TAZOBACTAM 3.375 G IVPB
3.3750 g | Freq: Three times a day (TID) | INTRAVENOUS | Status: DC
  Administered 2018-01-19 – 2018-01-24 (×15): 3.375 g via INTRAVENOUS
  Filled 2018-01-18 (×17): qty 50

## 2018-01-18 MED ORDER — SODIUM CHLORIDE 0.9 % IV SOLN: INTRAVENOUS | Status: DC

## 2018-01-18 NOTE — ED Provider Notes (Addendum)
[facility] EMERGENCY DEPARTMENT Provider Note   CSN: [id number] Arrival date & time: 01/18/18  1735     History   Chief Complaint Chief Complaint  Patient presents with  . Wound Infection    HPI Francisco Hutchinson is a 47 y.o. male.  Patient is a 47 year old male with a history of hypertension, diabetes and HIV who presents with a leg infection.  He states that about 4 to 5 days ago he noticed a little bit of redness in his right toes.  Over the last 2 days it has progressed rapidly with redness and swelling throughout his foot and blackened areas to his toes.  He has had increased pain and swelling to his foot.  He started having fevers and chills today.  He has had vomiting over the last 4 to 5 days.  He has a little bit of cough but no other URI symptoms.  No abdominal pain.  No urinary symptoms.  He does have a prior history of necrotizing fasciitis twice.  Once in his right arm and once in his groin.     Past Medical History:  Diagnosis Date  . Allergy   . Anxiety and depression 05/29/2017  . Diabetes mellitus without complication ([place])   . HIV (human immunodeficiency virus infection) ([place])   . Hypertension   . Stroke [facility])     Patient Active Problem List   Diagnosis Date Noted  . Necrotizing fasciitis ([place]) 01/18/2018  . Depression, major, single episode, in partial remission ([place]) 07/30/2017  . Hyperlipidemia associated with type 2 diabetes mellitus ([place]) 06/24/2017  . Hypertension associated with diabetes ([place]) 06/24/2017  . Hidradenitis 05/29/2017  . Candidal intertrigo 05/29/2017  . Stroke ([place])   . HIV disease ([place])   . CKD (chronic kidney disease), stage III ([place]) 03/25/2017  . Type 2 diabetes mellitus with diabetic chronic kidney disease ([place]) 03/25/2017    Past Surgical History:  Procedure Laterality Date  . abscess removed from back and left armpit    . IRRIGATION AND DEBRIDEMENT BUTTOCKS Left 03/23/2017   Procedure: IRRIGATION AND  DEBRIDEMENT LEFT BUTTOCK ABSCESS;  Surgeon: Excell Seltzer, MD;  Location: [facility] ORS;  Service: General;  Laterality: Left;  . IRRIGATION AND DEBRIDEMENT BUTTOCKS N/A 03/25/2017   Procedure: IRRIGATION AND DEBRIDEMENT DRESSING CHANGE AND EXPLORATION OF PERINEAL WOUND; CYSTOSCOPY;  Surgeon: Excell Seltzer, MD;  Location: [facility] ORS;  Service: General;  Laterality: N/A;  . PARS PLANA VITRECTOMY 27 GAUGE WITH 25 GAUGE PORT Left 08/31/2017  . Surgery Right Arm          Home Medications    Prior to Admission medications   Medication Sig Start Date End Date Taking? Authorizing Provider  acetaminophen (TYLENOL) 325 MG tablet Take 2 tablets (650 mg total) by mouth every 6 (six) hours as needed for mild pain, fever or headache (or Fever >/= 101). 12/29/17  Yes [name], MD  amLODipine (NORVASC) 5 MG tablet Take 1 tablet (5 mg total) by mouth daily. 07/08/17  Yes [name], MD  aspirin EC 81 MG tablet Take 81 mg by mouth at bedtime.   Yes [provider]  atorvastatin (LIPITOR) 40 MG tablet TAKE 1 TABLET BY MOUTH ONCE DAILY 10/02/17  Yes [name], MD  DESCOVY 200-25 MG tablet Take 1 tablet by mouth at bedtime.  03/19/17  Yes [provider]  fenofibrate 160 MG tablet Take 0.5 tablets (80 mg total) by mouth at bedtime. 09/03/17  Yes Dimas Chyle  M, MD  Ferrous Gluconate (IRON 27 PO) Take 1 tablet by mouth every other day.    Yes [provider]  hydrochlorothiazide (HYDRODIURIL) 25 MG tablet Take 1 tablet (25 mg total) by mouth daily. Patient taking differently: Take 25 mg by mouth every other day.  06/02/17  Yes [name], MD  LANTUS SOLOSTAR 100 UNIT/ML Solostar Pen Inject 55 Units into the skin at bedtime. 01/05/18  Yes [name], MD  metoprolol tartrate (LOPRESSOR) 100 MG tablet Take 100 mg by mouth 2 (two) times daily.  01/26/17  Yes [provider]  nystatin (MYCOSTATIN/NYSTOP) powder Apply topically 4 (four) times daily. Patient  taking differently: Apply topically 4 (four) times daily as needed (for irritation).  05/28/17  Yes [name], MD  sertraline (ZOLOFT) 100 MG tablet Take 100 mg by mouth every morning. 12/29/17  Yes [provider]  sertraline (ZOLOFT) 50 MG tablet Take 75 mg by mouth at bedtime. 11/16/17  Yes [provider]  TIVICAY 50 MG tablet Take 50 mg by mouth daily.  03/19/17  Yes [provider]  TRULICITY 1.5 UX/3.2GM SOPN  INJECT 1 PEN (1.5MG ) SUBCUTANEOUSLY ONCE A WEEK ON SUNDAY 12/29/17  Yes [name], MD  chlorhexidine (HIBICLENS) 4 % external liquid Apply daily as needed topically. Patient not taking: Reported on 01/18/2018 06/23/17   [name], MD  doxycycline (VIBRA-TABS) 100 MG tablet Take 1 tablet (100 mg total) by mouth 2 (two) times daily. Patient not taking: Reported on 01/18/2018 11/18/17   [name], MD  hydrocerin (EUCERIN) CREA Apply 1 application topically 2 (two) times daily. Patient not taking: Reported on 01/18/2018 04/01/17   Kalman Drape, PA  traMADol (ULTRAM) 50 MG tablet Take 1 tablet (50 mg total) by mouth every 6 (six) hours as needed for moderate pain or severe pain. Patient not taking: Reported on 01/18/2018 09/28/17   Marin Olp, MD    Family History Family History  Problem Relation Age of Onset  . Arthritis Mother   . Hypertension Mother   . Alcohol abuse Father   . Cancer Father   . Diabetes Father   . Early death Father   . Hyperlipidemia Father   . Hypertension Father   . Alcohol abuse Sister   . Heart attack Maternal Grandmother   . Heart disease Maternal Grandmother   . Hypertension Maternal Grandmother   . Cancer Maternal Grandfather        lung  . Cancer Paternal Grandmother   . Diabetes Paternal Grandmother     Social History Social History   Tobacco Use  . Smoking status: Never Smoker  . Smokeless tobacco: Never Used  Substance Use Topics  . Alcohol use: Yes    Alcohol/week: 1.2 oz    Types:  1 Glasses of wine, 1 Cans of beer per week    Comment: rare  . Drug use: No     Allergies   Patient has no known allergies.   Review of Systems Review of Systems  Constitutional: Positive for chills, fatigue and fever. Negative for diaphoresis.  HENT: Negative for congestion, rhinorrhea and sneezing.   Eyes: Negative.   Respiratory: Negative for cough, chest tightness and shortness of breath.   Cardiovascular: Negative for chest pain and leg swelling.  Gastrointestinal: Positive for nausea and vomiting. Negative for abdominal pain, blood in stool and diarrhea.  Genitourinary: Negative for difficulty urinating, flank pain, frequency and hematuria.  Musculoskeletal: Positive for joint swelling  and myalgias. Negative for arthralgias and back pain.  Skin: Positive for color change and wound. Negative for rash.  Neurological: Negative for dizziness, speech difficulty, weakness, numbness and headaches.     Physical Exam Updated Vital Signs BP 113/74   Pulse 78   Temp (!) 101.3 F (38.5 C) (Oral)   Resp (!) 31   Ht 5\' 11"  (1.803 m)   Wt 111.1 kg (245 lb)   SpO2 96%   BMI 34.17 kg/m   Physical Exam  Constitutional: He is oriented to person, place, and time. He appears well-developed and well-nourished.  HENT:  Head: Normocephalic and atraumatic.  Eyes: Pupils are equal, round, and reactive to light.  Neck: Normal range of motion. Neck supple.  Cardiovascular: Normal rate, regular rhythm and normal heart sounds.  Mildly tachycardic  Pulmonary/Chest: Effort normal and breath sounds normal. No respiratory distress. He has no wheezes. He has no rales. He exhibits no tenderness.  Mildly tachypneic  Abdominal: Soft. Bowel sounds are normal. There is no tenderness. There is no rebound and no guarding.  Musculoskeletal: Normal range of motion. He exhibits no edema.  Patient has edema and erythema throughout his toes and foot on the right lower extremity.  He has blackened  discoloration to the toes.  Dorsalis pedis pulses present by Doppler.  There is some redness extending to the ankle and lower leg.  Lymphadenopathy:    He has no cervical adenopathy.  Neurological: He is alert and oriented to person, place, and time.  Skin: Skin is warm and dry. No rash noted.  Psychiatric: He has a normal mood and affect.         ED Treatments / Results  Labs (all labs ordered are listed, but only abnormal results are displayed) Labs Reviewed  COMPREHENSIVE METABOLIC PANEL - Abnormal; Notable for the following components:      Result Value   Sodium 133 (*)    Potassium 3.2 (*)    Chloride 91 (*)    Glucose, Bld 107 (*)    BUN 33 (*)    Creatinine, Ser 2.19 (*)    Calcium 8.5 (*)    Total Protein 8.4 (*)    Albumin 2.2 (*)    GFR calc non Af Amer 35 (*)    GFR calc Af Amer 40 (*)    All other components within normal limits  CBC WITH DIFFERENTIAL/PLATELET - Abnormal; Notable for the following components:   WBC 31.3 (*)    RBC 3.73 (*)    Hemoglobin 11.4 (*)    HCT 33.3 (*)    Platelets 439 (*)    Neutro Abs 29.8 (*)    Lymphs Abs 0.6 (*)    All other components within normal limits  CULTURE, BLOOD (ROUTINE X 2)  CULTURE, BLOOD (ROUTINE X 2)  URINALYSIS, ROUTINE W REFLEX MICROSCOPIC  BASIC METABOLIC PANEL  I-STAT CG4 LACTIC ACID, ED    EKG EKG Interpretation  Date/Time:  Monday January 18 2018 17:48:52 EDT Ventricular Rate:  71 PR Interval:    QRS Duration: 106 QT Interval:  415 QTC Calculation: 451 R Axis:   12 Text Interpretation:  Sinus rhythm Short PR interval Left atrial enlargement Abnormal R-wave progression, early transition Nonspecific T abnormalities, lateral leads Baseline wander in lead(s) V1 V2 V5 No old tracing to compare Confirmed by Malvin Johns [phone]) on 01/18/2018 6:31:13 PM   Radiology Dg Chest Port 1 View  Result Date: 01/18/2018 CLINICAL DATA:  RIGHT foot gangrene. EXAM: PORTABLE CHEST  1 VIEW COMPARISON:  None.  FINDINGS: Cardiac silhouette is mildly enlarged. Mediastinal silhouette is not suspicious. Prominent RIGHT pleural fat. No pleural effusion or focal consolidation. No pneumothorax. Soft tissue planes and included osseous structures are non suspicious. IMPRESSION: Mild cardiomegaly.  No acute pulmonary process. Electronically Signed   By: Elon Alas M.D.   On: 01/18/2018 18:45   Dg Foot Complete Right  Result Date: 01/18/2018 CLINICAL DATA:  RIGHT foot gangrene.  Potential abscess. EXAM: RIGHT FOOT COMPLETE - 3+ VIEW COMPARISON:  None. FINDINGS: There is no evidence of fracture or dislocation. There is no evidence of arthropathy or other focal bone abnormality. Soft tissue swelling with subcutaneous gas mid and forefoot. Advanced vascular calcifications. IMPRESSION: Mid and forefoot subcutaneous gas most consistent with necrotizing fasciitis. Advanced vascular calcifications. No radiographic evidence of osteomyelitis. Acute findings discussed with and reconfirmed by Dr.Arlin Savona on 01/18/2018 at 6:47 pm. Electronically Signed   By: Elon Alas M.D.   On: 01/18/2018 18:47    Procedures Procedures (including critical care time)  Medications Ordered in ED Medications  vancomycin (VANCOCIN) 1,250 mg in sodium chloride 0.9 % 250 mL IVPB (has no administration in time range)  piperacillin-tazobactam (ZOSYN) IVPB 3.375 g (has no administration in time range)  piperacillin-tazobactam (ZOSYN) IVPB 3.375 g (0 g Intravenous Stopped 01/18/18 2027)  sodium chloride 0.9 % bolus 1,000 mL (0 mLs Intravenous Stopped 01/18/18 2131)  vancomycin (VANCOCIN) 2,000 mg in sodium chloride 0.9 % 500 mL IVPB (2,000 mg Intravenous New Bag/Given 01/18/18 1930)     Initial Impression / Assessment and Plan / ED Course  I have reviewed the triage vital signs and the nursing notes.  Pertinent labs & imaging results that were available during my care of the patient were reviewed by me and considered in my medical  decision making (see chart for details).     Patient is a 47 year old male with a history of HIV and diabetes who presents with a rapidly expanding foot infection.  X-ray shows gas in the tissues which is concerning for necrotizing fasciitis.  He was treated per sepsis protocol on arrival with vancomycin and Zosyn and IV fluids.  His lactate is normal but his white count is markedly elevated at 32,000.  I spoke with Dr. Alvan Dame with orthopedics and advised him that patient likely has necrotizing fasciitis of the right foot.  He will consult on the patient and request hospitalist admission.  He requested patient remain n.p.o. after midnight for surgical debridement of the wound.  I spoke with Dr. Hal Hope who will admit the patient.  CRITICAL CARE Performed by: Malvin Johns Total critical care time: 60 minutes Critical care time was exclusive of separately billable procedures and treating other patients. Critical care was necessary to treat or prevent imminent or life-threatening deterioration. Critical care was time spent personally by me on the following activities: development of treatment plan with patient and/or surrogate as well as nursing, discussions with consultants, evaluation of patient's response to treatment, examination of patient, obtaining history from patient or surrogate, ordering and performing treatments and interventions, ordering and review of laboratory studies, ordering and review of radiographic studies, pulse oximetry and re-evaluation of patient's condition.   Final Clinical Impressions(s) / ED Diagnoses   Final diagnoses:  Necrotizing fasciitis [facility])    ED Discharge Orders    None       Malvin Johns, MD 01/18/18 2139    Malvin Johns, MD 02/05/18 1022

## 2018-01-18 NOTE — H&P (Signed)
History and Physical    Francisco Hutchinson GHW:[id number] DOB: February 26, 1971 DOA: 01/18/2018  PCP: [name], MD  Patient coming from: Home.  Chief Complaint: Right foot infection.  HPI: Francisco Hutchinson is a 47 y.o. male with history of diabetes mellitus type 2, HIV, hypertension presents to the ER because of worsening wound on the right foot which has been progressing over the last 4 to 5 days.  Patient noticed initially a redness around the right foot great toe which is progressed proximally with the areas of darkening on the toes.  Denies any fever chills or any pain.  Has had previous episode of necrotizing fasciitis involving the right upper extremity.  ED Course: In the ER on exam patient has right foot swollen and x-rays revealed features concerning for necrotizing fasciitis.  On-call orthopedic surgeon has been consulted.  Patient had been empirically placed on antibiotic.  Patient does not look septic.  Review of Systems: As per HPI, rest all negative.   Past Medical History:  Diagnosis Date  . Allergy   . Anxiety and depression 05/29/2017  . Diabetes mellitus without complication ([name] [employer] [place])   . HIV (human immunodeficiency virus infection) ([place])   . Hypertension   . Stroke [facility])     Past Surgical History:  Procedure Laterality Date  . abscess removed from back and left armpit    . IRRIGATION AND DEBRIDEMENT BUTTOCKS Left 03/23/2017   Procedure: IRRIGATION AND DEBRIDEMENT LEFT BUTTOCK ABSCESS;  Surgeon: Excell Seltzer, MD;  Location: [facility];  Service: General;  Laterality: Left;  . IRRIGATION AND DEBRIDEMENT BUTTOCKS N/A 03/25/2017   Procedure: IRRIGATION AND DEBRIDEMENT DRESSING CHANGE AND EXPLORATION OF PERINEAL WOUND; CYSTOSCOPY;  Surgeon: Excell Seltzer, MD;  Location: [facility];  Service: General;  Laterality: N/A;  . PARS PLANA VITRECTOMY 27 GAUGE WITH 25 GAUGE PORT Left 08/31/2017  . Surgery Right Arm       reports that he has never smoked. He has never  used smokeless tobacco. He reports that he drinks about 1.2 oz of alcohol per week. He reports that he does not use drugs.  No Known Allergies  Family History  Problem Relation Age of Onset  . Arthritis Mother   . Hypertension Mother   . Alcohol abuse Father   . Cancer Father   . Diabetes Father   . Early death Father   . Hyperlipidemia Father   . Hypertension Father   . Alcohol abuse Sister   . Heart attack Maternal Grandmother   . Heart disease Maternal Grandmother   . Hypertension Maternal Grandmother   . Cancer Maternal Grandfather        lung  . Cancer Paternal Grandmother   . Diabetes Paternal Grandmother     Prior to Admission medications   Medication Sig Start Date End Date Taking? Authorizing Provider  acetaminophen (TYLENOL) 325 MG tablet Take 2 tablets (650 mg total) by mouth every 6 (six) hours as needed for mild pain, fever or headache (or Fever >/= 101). 12/29/17  Yes [name], MD  amLODipine (NORVASC) 5 MG tablet Take 1 tablet (5 mg total) by mouth daily. 07/08/17  Yes [name], MD  aspirin EC 81 MG tablet Take 81 mg by mouth at bedtime.   Yes [provider]  atorvastatin (LIPITOR) 40 MG tablet TAKE 1 TABLET BY MOUTH ONCE DAILY 10/02/17  Yes [name], MD  DESCOVY 200-25 MG tablet Take 1 tablet by mouth at bedtime.  03/19/17  Yes  [provider]  fenofibrate 160 MG tablet Take 0.5 tablets (80 mg total) by mouth at bedtime. 09/03/17  Yes [name], MD  Ferrous Gluconate (IRON 27 PO) Take 1 tablet by mouth every other day.    Yes [provider]  hydrochlorothiazide (HYDRODIURIL) 25 MG tablet Take 1 tablet (25 mg total) by mouth daily. Patient taking differently: Take 25 mg by mouth every other day.  06/02/17  Yes [name], MD  LANTUS SOLOSTAR 100 UNIT/ML Solostar Pen Inject 55 Units into the skin at bedtime. 01/05/18  Yes [name], MD  metoprolol tartrate (LOPRESSOR) 100 MG tablet Take 100 mg by mouth 2  (two) times daily.  01/26/17  Yes [provider]  nystatin (MYCOSTATIN/NYSTOP) powder Apply topically 4 (four) times daily. Patient taking differently: Apply topically 4 (four) times daily as needed (for irritation).  05/28/17  Yes [name], MD  sertraline (ZOLOFT) 100 MG tablet Take 100 mg by mouth every morning. 12/29/17  Yes [provider]  sertraline (ZOLOFT) 50 MG tablet Take 75 mg by mouth at bedtime. 11/16/17  Yes [provider]  TIVICAY 50 MG tablet Take 50 mg by mouth daily.  03/19/17  Yes [provider]  TRULICITY 1.5 JX/9.1YN SOPN  INJECT 1 PEN (1.5MG ) SUBCUTANEOUSLY ONCE A WEEK ON SUNDAY 12/29/17  Yes [name], MD  chlorhexidine (HIBICLENS) 4 % external liquid Apply daily as needed topically. Patient not taking: Reported on 01/18/2018 06/23/17   [name], MD  doxycycline (VIBRA-TABS) 100 MG tablet Take 1 tablet (100 mg total) by mouth 2 (two) times daily. Patient not taking: Reported on 01/18/2018 11/18/17   [name], MD  hydrocerin (EUCERIN) CREA Apply 1 application topically 2 (two) times daily. Patient not taking: Reported on 01/18/2018 04/01/17   Kalman Drape, PA  traMADol (ULTRAM) 50 MG tablet Take 1 tablet (50 mg total) by mouth every 6 (six) hours as needed for moderate pain or severe pain. Patient not taking: Reported on 01/18/2018 09/28/17   Marin Olp, MD    Physical Exam: Vitals:   01/18/18 1845 01/18/18 2000 01/18/18 2100 01/18/18 2115  BP:      Pulse: 69 68 73 78  Resp: 19 18 (!) 23 (!) 31  Temp:      TempSrc:      SpO2: 97% 97% 96% 96%  Weight:      Height:          Constitutional: Moderately built and nourished. Vitals:   01/18/18 1845 01/18/18 2000 01/18/18 2100 01/18/18 2115  BP:      Pulse: 69 68 73 78  Resp: 19 18 (!) 23 (!) 31  Temp:      TempSrc:      SpO2: 97% 97% 96% 96%  Weight:      Height:       Eyes: Anicteric no pallor. ENMT: No discharge from the ears eyes nose or  mouth. Neck: No mass felt.  No neck rigidity.  No JVD appreciated. Respiratory: No rhonchi or crepitations. Cardiovascular: S1-S2 heard no murmurs appreciated. Abdomen: Soft nontender bowel sounds present. Musculoskeletal: Right foot has darkened area of the toes with erythema pulses felt. Skin: Right foot has darkened areas around the toes with necrotic looking skin with erythema. Neurologic: Alert awake oriented to time place and person.  Moves all extremities. Psychiatric: Appears normal.  Normal affect.   Labs on Admission: I have personally reviewed following labs and imaging studies  CBC: Recent Labs  Lab 01/18/18 1756  WBC 31.3*  NEUTROABS 29.8*  HGB 11.4*  HCT 33.3*  MCV 89.3  PLT 270*   Basic Metabolic Panel: Recent Labs  Lab 01/18/18 1756  NA 133*  K 3.2*  CL 91*  CO2 28  GLUCOSE 107*  BUN 33*  CREATININE 2.19*  CALCIUM 8.5*   GFR: Estimated Creatinine Clearance: 54.6 mL/min (A) (by C-G formula based on SCr of 2.19 mg/dL (H)). Liver Function Tests: Recent Labs  Lab 01/18/18 1756  AST 33  ALT 18  ALKPHOS 122  BILITOT 0.9  PROT 8.4*  ALBUMIN 2.2*   No results for input(s): LIPASE, AMYLASE in the last 168 hours. No results for input(s): AMMONIA in the last 168 hours. Coagulation Profile: No results for input(s): INR, PROTIME in the last 168 hours. Cardiac Enzymes: No results for input(s): CKTOTAL, CKMB, CKMBINDEX, TROPONINI in the last 168 hours. BNP (last 3 results) No results for input(s): PROBNP in the last 8760 hours. HbA1C: No results for input(s): HGBA1C in the last 72 hours. CBG: No results for input(s): GLUCAP in the last 168 hours. Lipid Profile: No results for input(s): CHOL, HDL, LDLCALC, TRIG, CHOLHDL, LDLDIRECT in the last 72 hours. Thyroid Function Tests: No results for input(s): TSH, T4TOTAL, FREET4, T3FREE, THYROIDAB in the last 72 hours. Anemia Panel: No results for input(s): VITAMINB12, FOLATE, FERRITIN, TIBC, IRON,  RETICCTPCT in the last 72 hours. Urine analysis: No results found for: COLORURINE, APPEARANCEUR, LABSPEC, PHURINE, GLUCOSEU, HGBUR, BILIRUBINUR, KETONESUR, PROTEINUR, UROBILINOGEN, NITRITE, LEUKOCYTESUR Sepsis Labs: @LABRCNTIP (procalcitonin:4,lacticidven:4) )No results found for this or any previous visit (from the past 240 hour(s)).   Radiological Exams on Admission: Dg Chest Port 1 View  Result Date: 01/18/2018 CLINICAL DATA:  RIGHT foot gangrene. EXAM: PORTABLE CHEST 1 VIEW COMPARISON:  None. FINDINGS: Cardiac silhouette is mildly enlarged. Mediastinal silhouette is not suspicious. Prominent RIGHT pleural fat. No pleural effusion or focal consolidation. No pneumothorax. Soft tissue planes and included osseous structures are non suspicious. IMPRESSION: Mild cardiomegaly.  No acute pulmonary process. Electronically Signed   By: Elon Alas M.D.   On: 01/18/2018 18:45   Dg Foot Complete Right  Result Date: 01/18/2018 CLINICAL DATA:  RIGHT foot gangrene.  Potential abscess. EXAM: RIGHT FOOT COMPLETE - 3+ VIEW COMPARISON:  None. FINDINGS: There is no evidence of fracture or dislocation. There is no evidence of arthropathy or other focal bone abnormality. Soft tissue swelling with subcutaneous gas mid and forefoot. Advanced vascular calcifications. IMPRESSION: Mid and forefoot subcutaneous gas most consistent with necrotizing fasciitis. Advanced vascular calcifications. No radiographic evidence of osteomyelitis. Acute findings discussed with and reconfirmed by Dr.MELANIE BELFI on 01/18/2018 at 6:47 pm. Electronically Signed   By: Elon Alas M.D.   On: 01/18/2018 18:47    EKG: Independently reviewed.  Normal sinus rhythm.  Assessment/Plan Principal Problem:   Necrotizing fasciitis ([place]) Active Problems:   CKD (chronic kidney disease), stage III (HCC)   Type 2 diabetes mellitus with diabetic chronic kidney disease ([place])   HIV disease ([place])   Hypertension associated with diabetes  ([place])    1. Necrotizing fasciitis involving the right foot -patient will be kept n.p.o. past midnight in anticipation of surgery.  Patient is placed on empiric antibiotics.  Follow cultures.  Probably will need infectious disease consult in the morning.  [name] will be doing surgery. 2. Diabetes mellitus type 2 -usually takes Lantus 50 units at bedtime.  I have decreased to 20 units and can change to regular dose  after surgery.  Sliding scale coverage.  Hold Trulicity for now. 3. Hypertension -continue metoprolol and amlodipine.  Hold hydrochlorothiazide due to acute renal failure.  PRN IV hydralazine. 4. Acute on chronic renal failure stage III -gently hydrate follow metabolic panel hold hydrochlorothiazide. 5. HIV -patient does not recall his last CD4 count.  Continue antiretrovirals consult infectious disease. 6. Anemia likely from renal disease -follow CBC.  Type and screen.   DVT prophylaxis: SCDs. Code Status: Full code. Family Communication: No family at the bedside. Disposition Plan: Home. Consults called: Orthopedics. Admission status: Inpatient.   Rise Patience MD [facility] Pager [phone].  If 7PM-7AM, please contact night-coverage [email or web address] [facility]  01/18/2018, 9:46 PM

## 2018-01-18 NOTE — Progress Notes (Signed)
Pharmacy Antibiotic Note  Francisco Hutchinson is a 47 y.o. male admitted on 01/18/2018 with wound infection.  Imaging revealed necrotizing fasciitis.  Pharmacy has been consulted for vancomycin and Zosyn dosing.  Patient has AKI with estimated CrCL 44 ml/min.  Tmax 101.3, WBC 31.3, LA 1.42.   Plan: Vanc 2gm IV x 1, then 1250mg  IV Q24H Zosyn EID 3.375gm IV Q8H Monitor renal fxn, clinical progress, vanc trough as indicated F/U KCL supplementation   Height: 5\' 11"  (180.3 cm) Weight: 245 lb (111.1 kg) IBW/kg (Calculated) : 75.3  Temp (24hrs), Avg:100.9 F (38.3 C), Min:100.5 F (38.1 C), Max:101.3 F (38.5 C)  Recent Labs  Lab 01/18/18 1756 01/18/18 1821  WBC 31.3*  --   CREATININE 2.19*  --   LATICACIDVEN  --  1.42    Estimated Creatinine Clearance: 54.6 mL/min (A) (by C-G formula based on SCr of 2.19 mg/dL (H)).    No Known Allergies   Vanc 6/10 >> Zosyn 6/10 >>  6/10 BCx -    [name] D. Mina Marble, PharmD, BCPS, [place] Pager:  [phone] 01/18/2018, 8:39 PM

## 2018-01-18 NOTE — ED Notes (Signed)
Pt verbalized that his parter, kelly best is able to receive medical information.

## 2018-01-18 NOTE — ED Triage Notes (Signed)
Per ems pt from [place] being treated for gangrene right foot. PCP concerned potential for sepsis. EMS called out. bp 119/75 ,hr 75. Temp 97.6. rr 18.

## 2018-01-18 NOTE — Progress Notes (Signed)
   Subjective:  Francisco Hutchinson is a 47 y.o. male who presents today for same-day appointment with a chief complaint of vomiting.   HPI:  Vomiting, acute problem Started a couple days ago. Also with fever and purple discoloration of his right  foot. He has not had anything to eat in 2 days due to the vomiting. No treatments tried. No obvious precipitating factors. Symptoms are worse with strong smells.    Review of Systems  Constitutional: Positive for chills and fever.  HENT: Negative.   Eyes: Negative.   Respiratory: Negative.   Cardiovascular: Negative.   Gastrointestinal: Positive for vomiting.  Genitourinary: Negative.   Musculoskeletal: Positive for myalgias.  Skin: Positive for rash.  Neurological: Negative.   Endo/Heme/Allergies: Negative.   Psychiatric/Behavioral: Negative.    PMH: He reports that he has never smoked. He has never used smokeless tobacco. He reports that he drinks about 1.2 oz of alcohol per week. He reports that he does not use drugs. PMH significant for T2DM and HIV.   Objective:  Physical Exam: BP 116/68 (BP Location: Left Arm, Patient Position: Sitting, Cuff Size: Normal)   Pulse 71   Temp (!) 100.5 F (38.1 C) (Oral)   Ht 5\' 11"  (1.803 m)   SpO2 96%   BMI 32.64 kg/m   Gen: Ill appearing 47 year old man, actively vomiting sitting in wheelchair.  MSK: Right foot with purple discoloration noted to several toes with surrounding erythema and purulent discharge  Assessment/Plan:  Vomiting/Gangrene/Sepsis Patient with sepsis secondary to right foot gangrene. Will give 1G of rocephin in the office. Will also give 8mg  of of zofram for his vomiting/nausea. We attempted to draw blood cultures and start an IV in the office today however we were unsuccessful due to the patient's extent of dehydration. EMS was called to transport patient to the hospital given that he has a high risk of decompensation.   Patient has a HIGH level of MDM.  Algis Greenhouse.  [name], MD 01/18/2018 5:03 PM

## 2018-01-19 ENCOUNTER — Encounter (HOSPITAL_COMMUNITY): Payer: PPO

## 2018-01-19 ENCOUNTER — Encounter (HOSPITAL_COMMUNITY): Payer: Self-pay | Admitting: General Practice

## 2018-01-19 DIAGNOSIS — E43 Unspecified severe protein-calorie malnutrition: Secondary | ICD-10-CM

## 2018-01-19 DIAGNOSIS — Z794 Long term (current) use of insulin: Secondary | ICD-10-CM

## 2018-01-19 DIAGNOSIS — I96 Gangrene, not elsewhere classified: Secondary | ICD-10-CM | POA: Diagnosis present

## 2018-01-19 DIAGNOSIS — B20 Human immunodeficiency virus [HIV] disease: Secondary | ICD-10-CM

## 2018-01-19 DIAGNOSIS — E119 Type 2 diabetes mellitus without complications: Secondary | ICD-10-CM

## 2018-01-19 DIAGNOSIS — I10 Essential (primary) hypertension: Secondary | ICD-10-CM

## 2018-01-19 DIAGNOSIS — E1122 Type 2 diabetes mellitus with diabetic chronic kidney disease: Secondary | ICD-10-CM

## 2018-01-19 DIAGNOSIS — N183 Chronic kidney disease, stage 3 (moderate): Secondary | ICD-10-CM

## 2018-01-19 DIAGNOSIS — M726 Necrotizing fasciitis: Secondary | ICD-10-CM

## 2018-01-19 DIAGNOSIS — I70261 Atherosclerosis of native arteries of extremities with gangrene, right leg: Secondary | ICD-10-CM

## 2018-01-19 LAB — GLUCOSE, CAPILLARY
GLUCOSE-CAPILLARY: 85 mg/dL (ref 65–99)
GLUCOSE-CAPILLARY: 119 mg/dL — AB (ref 65–99)
Glucose-Capillary: 119 mg/dL — ABNORMAL HIGH (ref 65–99)
Glucose-Capillary: 131 mg/dL — ABNORMAL HIGH (ref 65–99)
Glucose-Capillary: 142 mg/dL — ABNORMAL HIGH (ref 65–99)
Glucose-Capillary: 181 mg/dL — ABNORMAL HIGH (ref 65–99)
Glucose-Capillary: 69 mg/dL (ref 65–99)

## 2018-01-19 LAB — CBC WITH DIFFERENTIAL/PLATELET
BASOS ABS: 0 10*3/uL (ref 0.0–0.1)
Basophils Relative: 0 %
EOS ABS: 0 10*3/uL (ref 0.0–0.7)
Eosinophils Relative: 0 %
HEMATOCRIT: 28.3 % — AB (ref 39.0–52.0)
Hemoglobin: 9.8 g/dL — ABNORMAL LOW (ref 13.0–17.0)
LYMPHS ABS: 1.1 10*3/uL (ref 0.7–4.0)
Lymphocytes Relative: 4 %
MCH: 30.9 pg (ref 26.0–34.0)
MCHC: 34.6 g/dL (ref 30.0–36.0)
MCV: 89.3 fL (ref 78.0–100.0)
MONOS PCT: 5 %
Monocytes Absolute: 1.3 10*3/uL — ABNORMAL HIGH (ref 0.1–1.0)
Neutro Abs: 24.4 10*3/uL — ABNORMAL HIGH (ref 1.7–7.7)
Neutrophils Relative %: 91 %
PLATELETS: 353 10*3/uL (ref 150–400)
RBC: 3.17 MIL/uL — AB (ref 4.22–5.81)
RDW: 13.7 % (ref 11.5–15.5)
WBC: 26.8 10*3/uL — AB (ref 4.0–10.5)

## 2018-01-19 LAB — TYPE AND SCREEN
ABO/RH(D): A POS
ANTIBODY SCREEN: NEGATIVE

## 2018-01-19 LAB — COMPREHENSIVE METABOLIC PANEL
ALT: 17 U/L (ref 17–63)
ANION GAP: 9 (ref 5–15)
AST: 28 U/L (ref 15–41)
Albumin: 1.7 g/dL — ABNORMAL LOW (ref 3.5–5.0)
Alkaline Phosphatase: 111 U/L (ref 38–126)
BILIRUBIN TOTAL: 0.8 mg/dL (ref 0.3–1.2)
BUN: 32 mg/dL — AB (ref 6–20)
CALCIUM: 7.7 mg/dL — AB (ref 8.9–10.3)
CO2: 26 mmol/L (ref 22–32)
Chloride: 98 mmol/L — ABNORMAL LOW (ref 101–111)
Creatinine, Ser: 1.98 mg/dL — ABNORMAL HIGH (ref 0.61–1.24)
GFR calc Af Amer: 46 mL/min — ABNORMAL LOW (ref >60)
GFR calc non Af Amer: 39 mL/min — ABNORMAL LOW (ref >60)
Glucose, Bld: 76 mg/dL (ref 65–99)
POTASSIUM: 2.8 mmol/L — AB (ref 3.5–5.1)
Sodium: 133 mmol/L — ABNORMAL LOW (ref 135–145)
Total Protein: 6.4 g/dL — ABNORMAL LOW (ref 6.5–8.1)

## 2018-01-19 LAB — MRSA PCR SCREENING: MRSA BY PCR: NEGATIVE

## 2018-01-19 LAB — T-HELPER CELLS (CD4) COUNT (NOT AT ARMC)
CD4 % Helper T Cell: 27 % — ABNORMAL LOW (ref 33–55)
CD4 T Cell Abs: 300 /uL — ABNORMAL LOW (ref 400–2700)

## 2018-01-19 LAB — ABO/RH: ABO/RH(D): A POS

## 2018-01-19 MED ORDER — POTASSIUM CHLORIDE 10 MEQ/100ML IV SOLN
10.0000 meq | INTRAVENOUS | Status: AC
  Administered 2018-01-19 (×3): 10 meq via INTRAVENOUS
  Filled 2018-01-19 (×4): qty 100

## 2018-01-19 MED ORDER — DEXTROSE 50 % IV SOLN
INTRAVENOUS | Status: AC
  Administered 2018-01-19: 25 mL
  Filled 2018-01-19: qty 50

## 2018-01-19 MED ORDER — INSULIN GLARGINE 100 UNIT/ML ~~LOC~~ SOLN
15.0000 [IU] | Freq: Every day | SUBCUTANEOUS | Status: DC
  Administered 2018-01-19 – 2018-01-24 (×6): 15 [IU] via SUBCUTANEOUS
  Filled 2018-01-19 (×6): qty 0.15

## 2018-01-19 MED ORDER — POTASSIUM CHLORIDE CRYS ER 20 MEQ PO TBCR
40.0000 meq | EXTENDED_RELEASE_TABLET | Freq: Once | ORAL | Status: AC
  Administered 2018-01-19: 40 meq via ORAL
  Filled 2018-01-19: qty 2

## 2018-01-19 MED ORDER — SODIUM CHLORIDE 0.9 % IV SOLN: INTRAVENOUS | Status: DC

## 2018-01-19 MED ORDER — SODIUM CHLORIDE 0.9 % IV SOLN
INTRAVENOUS | Status: DC
  Administered 2018-01-19: 19:00:00 via INTRAVENOUS

## 2018-01-19 MED ORDER — DEXTROSE-NACL 5-0.9 % IV SOLN: INTRAVENOUS | Status: DC

## 2018-01-19 NOTE — Consult Note (Signed)
ORTHOPAEDIC CONSULTATION  REQUESTING PHYSICIAN: Elmarie Shiley, MD  Chief Complaint: Dry gangrene right foot.  HPI: Francisco Hutchinson is a 47 y.o. male who presents with dry gangrene right forefoot.  Patient has uncontrolled diabetic neuropathy with peripheral vascular disease and severe protein caloric malnutrition.  Past Medical History:  Diagnosis Date  . Allergy   . Anxiety and depression 05/29/2017  . Diabetes mellitus without complication ([place])   . HIV (human immunodeficiency virus infection) ([place])   . Hypertension   . Stroke [facility])    Past Surgical History:  Procedure Laterality Date  . abscess removed from back and left armpit    . IRRIGATION AND DEBRIDEMENT BUTTOCKS Left 03/23/2017   Procedure: IRRIGATION AND DEBRIDEMENT LEFT BUTTOCK ABSCESS;  Surgeon: Excell Seltzer, MD;  Location: [facility];  Service: General;  Laterality: Left;  . IRRIGATION AND DEBRIDEMENT BUTTOCKS N/A 03/25/2017   Procedure: IRRIGATION AND DEBRIDEMENT DRESSING CHANGE AND EXPLORATION OF PERINEAL WOUND; CYSTOSCOPY;  Surgeon: Excell Seltzer, MD;  Location: [facility];  Service: General;  Laterality: N/A;  . PARS PLANA VITRECTOMY 27 GAUGE WITH 25 GAUGE PORT Left 08/31/2017  . Surgery Right Arm     Social History   Socioeconomic History  . Marital status: Single    Spouse name: Not on file  . Number of children: Not on file  . Years of education: Not on file  . Highest education level: Not on file  Occupational History  . Not on file  Social Needs  . Financial resource strain: Not on file  . Food insecurity:    Worry: Not on file    Inability: Not on file  . Transportation needs:    Medical: Not on file    Non-medical: Not on file  Tobacco Use  . Smoking status: Never Smoker  . Smokeless tobacco: Never Used  Substance and Sexual Activity  . Alcohol use: Yes    Alcohol/week: 1.2 oz    Types: 1 Glasses of wine, 1 Cans of beer per week    Comment: rare  . Drug use: No  . Sexual  activity: Never  Lifestyle  . Physical activity:    Days per week: Not on file    Minutes per session: Not on file  . Stress: Not on file  Relationships  . Social connections:    Talks on phone: Not on file    Gets together: Not on file    Attends religious service: Not on file    Active member of club or organization: Not on file    Attends meetings of clubs or organizations: Not on file    Relationship status: Not on file  Other Topics Concern  . Not on file  Social History Narrative  . Not on file   Family History  Problem Relation Age of Onset  . Arthritis Mother   . Hypertension Mother   . Alcohol abuse Father   . Cancer Father   . Diabetes Father   . Early death Father   . Hyperlipidemia Father   . Hypertension Father   . Alcohol abuse Sister   . Heart attack Maternal Grandmother   . Heart disease Maternal Grandmother   . Hypertension Maternal Grandmother   . Cancer Maternal Grandfather        lung  . Cancer Paternal Grandmother   . Diabetes Paternal Grandmother    - negative except otherwise stated in the family history section No Known Allergies Prior to Admission medications   Medication  Sig Start Date End Date Taking? Authorizing Provider  acetaminophen (TYLENOL) 325 MG tablet Take 2 tablets (650 mg total) by mouth every 6 (six) hours as needed for mild pain, fever or headache (or Fever >/= 101). 12/29/17  Yes [name], MD  amLODipine (NORVASC) 5 MG tablet Take 1 tablet (5 mg total) by mouth daily. 07/08/17  Yes [name], MD  aspirin EC 81 MG tablet Take 81 mg by mouth at bedtime.   Yes [provider]  atorvastatin (LIPITOR) 40 MG tablet TAKE 1 TABLET BY MOUTH ONCE DAILY 10/02/17  Yes [name], MD  DESCOVY 200-25 MG tablet Take 1 tablet by mouth at bedtime.  03/19/17  Yes [provider]  fenofibrate 160 MG tablet Take 0.5 tablets (80 mg total) by mouth at bedtime. 09/03/17  Yes [name], MD  Ferrous Gluconate (IRON  27 PO) Take 1 tablet by mouth every other day.    Yes [provider]  hydrochlorothiazide (HYDRODIURIL) 25 MG tablet Take 1 tablet (25 mg total) by mouth daily. Patient taking differently: Take 25 mg by mouth every other day.  06/02/17  Yes [name], MD  LANTUS SOLOSTAR 100 UNIT/ML Solostar Pen Inject 55 Units into the skin at bedtime. 01/05/18  Yes [name], MD  metoprolol tartrate (LOPRESSOR) 100 MG tablet Take 100 mg by mouth 2 (two) times daily.  01/26/17  Yes [provider]  nystatin (MYCOSTATIN/NYSTOP) powder Apply topically 4 (four) times daily. Patient taking differently: Apply topically 4 (four) times daily as needed (for irritation).  05/28/17  Yes [name], MD  sertraline (ZOLOFT) 100 MG tablet Take 100 mg by mouth every morning. 12/29/17  Yes [provider]  sertraline (ZOLOFT) 50 MG tablet Take 75 mg by mouth at bedtime. 11/16/17  Yes [provider]  TIVICAY 50 MG tablet Take 50 mg by mouth daily.  03/19/17  Yes [provider]  TRULICITY 1.5 VO/5.3GU SOPN  INJECT 1 PEN (1.5MG ) SUBCUTANEOUSLY ONCE A WEEK ON SUNDAY 12/29/17  Yes [name], MD  chlorhexidine (HIBICLENS) 4 % external liquid Apply daily as needed topically. Patient not taking: Reported on 01/18/2018 06/23/17   [name], MD  doxycycline (VIBRA-TABS) 100 MG tablet Take 1 tablet (100 mg total) by mouth 2 (two) times daily. Patient not taking: Reported on 01/18/2018 11/18/17   [name], MD  hydrocerin (EUCERIN) CREA Apply 1 application topically 2 (two) times daily. Patient not taking: Reported on 01/18/2018 04/01/17   Kalman Drape, PA  traMADol (ULTRAM) 50 MG tablet Take 1 tablet (50 mg total) by mouth every 6 (six) hours as needed for moderate pain or severe pain. Patient not taking: Reported on 01/18/2018 09/28/17   Marin Olp, MD   Dg Chest Port 1 View  Result Date: 01/18/2018 CLINICAL DATA:  RIGHT foot gangrene. EXAM: PORTABLE CHEST  1 VIEW COMPARISON:  None. FINDINGS: Cardiac silhouette is mildly enlarged. Mediastinal silhouette is not suspicious. Prominent RIGHT pleural fat. No pleural effusion or focal consolidation. No pneumothorax. Soft tissue planes and included osseous structures are non suspicious. IMPRESSION: Mild cardiomegaly.  No acute pulmonary process. Electronically Signed   By: Elon Alas M.D.   On: 01/18/2018 18:45   Dg Foot Complete Right  Result Date: 01/18/2018 CLINICAL DATA:  RIGHT foot gangrene.  Potential abscess. EXAM: RIGHT FOOT COMPLETE - 3+ VIEW COMPARISON:  None. FINDINGS: There is no evidence of fracture or dislocation. There is no  evidence of arthropathy or other focal bone abnormality. Soft tissue swelling with subcutaneous gas mid and forefoot. Advanced vascular calcifications. IMPRESSION: Mid and forefoot subcutaneous gas most consistent with necrotizing fasciitis. Advanced vascular calcifications. No radiographic evidence of osteomyelitis. Acute findings discussed with and reconfirmed by Dr.MELANIE BELFI on 01/18/2018 at 6:47 pm. Electronically Signed   By: Elon Alas M.D.   On: 01/18/2018 18:47   - pertinent xrays, CT, MRI studies were reviewed and independently interpreted  Positive ROS: All other systems have been reviewed and were otherwise negative with the exception of those mentioned in the HPI and as above.  Physical Exam: General: Alert, no acute distress Psychiatric: Patient is competent for consent with normal mood and affect Lymphatic: No axillary or cervical lymphadenopathy Cardiovascular: No pedal edema Respiratory: No cyanosis, no use of accessory musculature GI: No organomegaly, abdomen is soft and non-tender    Images:  @ENCIMAGES @  Labs:  Lab Results  Component Value Date   HGBA1C 11.3 (A) 01/05/2018   HGBA1C 10.8 09/24/2017   HGBA1C 10.9 (H) 03/23/2017   ESRSEDRATE 77 (H) 04/23/2017   CRP 2.5 04/23/2017   REPTSTATUS 03/30/2017 FINAL 03/23/2017    GRAMSTAIN  03/23/2017    ABUNDANT WBC PRESENT, PREDOMINANTLY PMN FEW GRAM NEGATIVE RODS MODERATE GRAM POSITIVE COCCI IN PAIRS    CULT  03/23/2017    RARE METHICILLIN RESISTANT STAPHYLOCOCCUS AUREUS RARE PROTEUS MIRABILIS MODERATE VIRIDANS STREPTOCOCCUS FEW BACTEROIDES FRAGILIS BETA LACTAMASE POSITIVE Performed at [facility], [place] [address] [place], [place] [address]    LABORGA METHICILLIN RESISTANT STAPHYLOCOCCUS AUREUS 03/23/2017   LABORGA PROTEUS MIRABILIS 03/23/2017   LABORGA VIRIDANS STREPTOCOCCUS 03/23/2017    Lab Results  Component Value Date   ALBUMIN 2.2 (L) 01/18/2018   ALBUMIN 3.3 (L) 05/28/2017   ALBUMIN 1.8 (L) 03/25/2017    Neurologic: Patient does not have protective sensation bilateral lower extremities.   MUSCULOSKELETAL:   Skin: Examination patient has dry gangrenous changes of the forefoot.  Patient's skin is thin and atrophic in the forefoot.  He has hair growth down to his mid tibia  Patient has a good palpable popliteal pulse he does not have a good palpable dorsalis pedis or posterior tibial pulse.  Patient has a petechial rash on both lower extremities.  Patient's hemoglobin A1c is greater than 11 his albumin has ranged from 1.8-2.2.  Radiographs shows complete calcification of the vessels down to the midfoot with no destructive bony changes with air in the soft tissue up to the midfoot.  There is no ascending cellulitis no crepitation in the calf no clinical signs of necrotizing fasciitis.  Assessment: Diabetic insensate neuropathy poorly controlled  Assessment: With severe peripheral vascular disease with severe protein caloric malnutrition and gangrene of the right forefoot.  Plan: Plan: Ankle-brachial indices are ordered.  Recommend consultation with vascular vein surgery.    A arterial study may be helpful, however patient's best option at this time is a transtibial amputation.  I will await vascular surgery's recommendations.  Thank  you for the consult and the opportunity to see Francisco Hutchinson, [place] [phone] 7:36 AM

## 2018-01-19 NOTE — Progress Notes (Signed)
PROGRESS NOTE    Francisco Hutchinson  JHE:[id number] DOB: 1971-05-14 DOA: 01/18/2018 PCP: [name], MD    Brief Narrative: Francisco Hutchinson is a 47 y.o. male with history of diabetes mellitus type 2, HIV, hypertension presents to the ER because of worsening wound on the right foot which has been progressing over the last 4 to 5 days.  Patient noticed initially a redness around the right foot great toe which is progressed proximally with the areas of darkening on the toes.  Denies any fever chills or any pain.  Has had previous episode of necrotizing fasciitis involving the right upper extremity.  ED Course: In the ER on exam patient has right foot swollen and x-rays revealed features concerning for necrotizing fasciitis.  On-call orthopedic surgeon has been consulted.  Patient had been empirically placed on antibiotic.  Patient does not look septic.     Assessment & Plan:   Principal Problem:   Necrotizing fasciitis ([place]) Active Problems:   CKD (chronic kidney disease), stage III (HCC)   Type 2 diabetes mellitus with diabetic chronic kidney disease ([place])   HIV disease ([place])   Hypertension associated with diabetes ([place])   Gangrene of right foot ([place])   Severe protein-calorie malnutrition ([place])   1-Right foot infection, dry gangrene;  Appreciate Dr Sharol Given evaluation.  Continue with IV antibiotics.  ABI ordered.  I have consulted Vascular for evaluation.   2-Diabetes; type 2. Hypoglycemia; due to NPO status. Received amp D 50.  Decrease lantus tonight, hold if NPO.   HTN;  Continue with metoprolol.  Hold Norvasc.   Acute on Chronic kidney diseases stage III;  Continue with IV fluids.  Last cr per records 1.6.  Peak to 2 on admission. Trending down today with fluids.   HIV -patient does not recall his last CD4 count.  Continue anti-retrovirals.   Anemia likely from renal disease -follow CBC. Follow trend. Drop in hb could be hemodilution.   Severe protein  caloric malnutrition;  Nutrition consulted.      DVT prophylaxis: scd Code Status: full code.  Family Communication: care discussed with patient.  Disposition Plan: to be determine  Consultants:   DR Sharol Given  Vascular  Procedures:  ABI   Antimicrobials:  Vancomycin Zosyn CLinda  Subjective: He denies foot pain. Redness and discoloration since last Thursday./    Objective: Vitals:   01/18/18 2245 01/19/18 0000 01/19/18 0135 01/19/18 0627  BP: 118/77 (!) 142/78 101/81 135/87  Pulse: 70 67 63 67  Resp: (!) 25 (!) 23 20 18   Temp:   98.2 F (36.8 C) 98.8 F (37.1 C)  TempSrc:   Oral Oral  SpO2: 95% 99% 100% 98%  Weight:      Height:   5\' 11"  (1.803 m)     Intake/Output Summary (Last 24 hours) at 01/19/2018 0833 Last data filed at 01/19/2018 0650 Gross per 24 hour  Intake 1725 ml  Output -  Net 1725 ml   Filed Weights   01/18/18 1742  Weight: 111.1 kg (245 lb)    Examination:  General exam: Appears calm and comfortable  Respiratory system: Clear to auscultation. Respiratory effort normal. Cardiovascular system: S1 & S2 heard, RRR. No JVD, murmurs, rubs, gallops or clicks. No pedal edema. Gastrointestinal system: Abdomen is nondistended, soft and nontender. No organomegaly or masses felt. Normal bowel sounds heard. Central nervous system: Alert and oriented. No focal neurological deficits. Extremities: Symmetric 5 x 5 power. Skin: right foot with erythema, and necrosis on  plantar aspect of toes.      Data Reviewed: I have personally reviewed following labs and imaging studies  CBC: Recent Labs  Lab 01/18/18 1756 01/19/18 0616  WBC 31.3* 26.8*  NEUTROABS 29.8* PENDING  HGB 11.4* 9.8*  HCT 33.3* 28.3*  MCV 89.3 89.3  PLT 439* 324   Basic Metabolic Panel: Recent Labs  Lab 01/18/18 1756 01/19/18 0616  NA 133* 133*  K 3.2* 2.8*  CL 91* 98*  CO2 28 26  GLUCOSE 107* 76  BUN 33* 32*  CREATININE 2.19* 1.98*  CALCIUM 8.5* 7.7*    GFR: Estimated Creatinine Clearance: 60.3 mL/min (A) (by C-G formula based on SCr of 1.98 mg/dL (H)). Liver Function Tests: Recent Labs  Lab 01/18/18 1756 01/19/18 0616  AST 33 28  ALT 18 17  ALKPHOS 122 111  BILITOT 0.9 0.8  PROT 8.4* 6.4*  ALBUMIN 2.2* 1.7*   No results for input(s): LIPASE, AMYLASE in the last 168 hours. No results for input(s): AMMONIA in the last 168 hours. Coagulation Profile: No results for input(s): INR, PROTIME in the last 168 hours. Cardiac Enzymes: No results for input(s): CKTOTAL, CKMB, CKMBINDEX, TROPONINI in the last 168 hours. BNP (last 3 results) No results for input(s): PROBNP in the last 8760 hours. HbA1C: No results for input(s): HGBA1C in the last 72 hours. CBG: Recent Labs  Lab 01/18/18 2210 01/19/18 0626 01/19/18 0657  GLUCAP 122* 69 131*   Lipid Profile: No results for input(s): CHOL, HDL, LDLCALC, TRIG, CHOLHDL, LDLDIRECT in the last 72 hours. Thyroid Function Tests: No results for input(s): TSH, T4TOTAL, FREET4, T3FREE, THYROIDAB in the last 72 hours. Anemia Panel: No results for input(s): VITAMINB12, FOLATE, FERRITIN, TIBC, IRON, RETICCTPCT in the last 72 hours. Sepsis Labs: Recent Labs  Lab 01/18/18 1821  LATICACIDVEN 1.42    No results found for this or any previous visit (from the past 240 hour(s)).       Radiology Studies: Dg Chest Port 1 View  Result Date: 01/18/2018 CLINICAL DATA:  RIGHT foot gangrene. EXAM: PORTABLE CHEST 1 VIEW COMPARISON:  None. FINDINGS: Cardiac silhouette is mildly enlarged. Mediastinal silhouette is not suspicious. Prominent RIGHT pleural fat. No pleural effusion or focal consolidation. No pneumothorax. Soft tissue planes and included osseous structures are non suspicious. IMPRESSION: Mild cardiomegaly.  No acute pulmonary process. Electronically Signed   By: Elon Alas M.D.   On: 01/18/2018 18:45   Dg Foot Complete Right  Result Date: 01/18/2018 CLINICAL DATA:  RIGHT foot  gangrene.  Potential abscess. EXAM: RIGHT FOOT COMPLETE - 3+ VIEW COMPARISON:  None. FINDINGS: There is no evidence of fracture or dislocation. There is no evidence of arthropathy or other focal bone abnormality. Soft tissue swelling with subcutaneous gas mid and forefoot. Advanced vascular calcifications. IMPRESSION: Mid and forefoot subcutaneous gas most consistent with necrotizing fasciitis. Advanced vascular calcifications. No radiographic evidence of osteomyelitis. Acute findings discussed with and reconfirmed by Dr.MELANIE BELFI on 01/18/2018 at 6:47 pm. Electronically Signed   By: Elon Alas M.D.   On: 01/18/2018 18:47        Scheduled Meds: . amLODipine  5 mg Oral Daily  . atorvastatin  40 mg Oral Daily  . dolutegravir  50 mg Oral Daily  . emtricitabine-tenofovir AF  1 tablet Oral QHS  . fenofibrate  80 mg Oral QHS  . ferrous gluconate  324 mg Oral Q breakfast  . insulin aspart  0-9 Units Subcutaneous TID WC  . insulin glargine  15 Units Subcutaneous Q2200  .  metoprolol tartrate  100 mg Oral BID  . sertraline  100 mg Oral Daily  . sertraline  75 mg Oral QHS   Continuous Infusions: . sodium chloride    . clindamycin (CLEOCIN) IV Stopped (01/19/18 0748)  . piperacillin-tazobactam (ZOSYN)  IV 3.375 g (01/19/18 0421)  . potassium chloride    . vancomycin       LOS: 1 day    Time spent: 35 minutes.     Elmarie Shiley, MD [facility] Pager [phone]  If 7PM-7AM, please contact night-coverage [email or web address] Password [facility] 01/19/2018, 8:33 AM

## 2018-01-19 NOTE — Plan of Care (Signed)
  Problem: Activity: Goal: Risk for activity intolerance will decrease Outcome: Progressing   Problem: Skin Integrity: Goal: Risk for impaired skin integrity will decrease Outcome: Progressing   

## 2018-01-19 NOTE — Progress Notes (Signed)
Hypoglycemic Event  CBG: 69  Treatment: D50 IV 25 mL  Symptoms: None  Follow-up CBG: [id number] CBG Result:131  Possible Reasons for Event: Inadequate meal intake  Comments/MD notified:no    Francisco Hutchinson

## 2018-01-19 NOTE — Consult Note (Addendum)
VASCULAR SURGERY ASSESSMENT & PLAN:   GANGRENE RIGHT FOOT WITH TIBIAL ARTERY OCCLUSIVE DISEASE: This patient presents with gangrenous wounds of the right foot and evidence of tibial artery occlusive disease on exam.  I think without revascularization he will likely require primary below the knee amputation.  For this reason I have recommended that we proceed with arteriography tomorrow which will be done with limited contrast.  This can be mostly done with CO2.  This has been scheduled for Dr. Servando Snare tomorrow.  Patient is currently being treated with intravenous antibiotics.  He is on clindamycin, Zosyn, and vancomycin.  I have reviewed with the patient the indications for arteriography. In addition, I have reviewed the potential complications of arteriography including but not limited to: Bleeding, arterial injury, arterial thrombosis, dye action, renal insufficiency, or other unpredictable medical problems. I have explained to the patient that if we find disease amenable to angioplasty we could potentially address this at the same time. I have discussed the potential complications of angioplasty and stenting, including but not limited to: Bleeding, arterial thrombosis, arterial injury, dissection, or the need for surgical intervention.  ABI's are pending.   He is HIV positive.   Deitra Mayo, MD, [facility] [phone] Office: [phone]  Reason for Consult:  PVD, gangrene right foot Requesting Physician:  [place]  History of Present Illness: This is a 47 y.o. male who presented to the hospital yesterday with c/o right foot pain.  He states that his foot started swelling on Thursday and it has progressively gotten worse.  He states that the toes started turning black over the weekend.  He does not recall injuring his foot.  He states before this happened, he was ambulating but does describe cramping in his calves when he walks.  He thinks he could walk a city block or less before the  pain starts.  He states that this improves with rest.  He denies rest pain.  He states about 3 years ago in [place], he had a vfib arrest and had 3 strokes that he described as watershed strokes.  He states he does not remember having weakness or numbness of extremities but felt this impacted his speech, which has recovered.  He states he had renal failure at that time as well and his kidneys recovered.   He does have uncontrolled DM with a hgbA1c of 11.3 a couple of weeks ago.  He takes insulin.  He has hypertension and is on a beta blocker as well as a CCB.  He is on a daily aspirin and statin.  He takes an SSRI for depression/anxiety.  He does have HIV and states he is on medication for this.  He states he has never smoked.  He has not had any major surgeries.    Past Medical History:  Diagnosis Date  . Allergy   . Anxiety and depression 05/29/2017  . Diabetes mellitus without complication ([place])   . HIV (human immunodeficiency virus infection) ([place])   . Hypertension   . Stroke [facility])    Past Surgical History:  Procedure Laterality Date  . abscess removed from back and left armpit    . IRRIGATION AND DEBRIDEMENT BUTTOCKS Left 03/23/2017   Procedure: IRRIGATION AND DEBRIDEMENT LEFT BUTTOCK ABSCESS;  Surgeon: Excell Seltzer, MD;  Location: [facility] ORS;  Service: General;  Laterality: Left;  . IRRIGATION AND DEBRIDEMENT BUTTOCKS N/A 03/25/2017   Procedure: IRRIGATION AND DEBRIDEMENT DRESSING CHANGE AND EXPLORATION OF PERINEAL WOUND; CYSTOSCOPY;  Surgeon: Excell Seltzer, MD;  Location: [facility];  Service: General;  Laterality: N/A;  . PARS PLANA VITRECTOMY 27 GAUGE WITH 25 GAUGE PORT Left 08/31/2017  . Surgery Right Arm      No Known Allergies  Prior to Admission medications   Medication Sig Start Date End Date Taking? Authorizing Provider  acetaminophen (TYLENOL) 325 MG tablet Take 2 tablets (650 mg total) by mouth every 6 (six) hours as needed for mild pain, fever or headache (or  Fever >/= 101). 12/29/17  Yes [name], MD  amLODipine (NORVASC) 5 MG tablet Take 1 tablet (5 mg total) by mouth daily. 07/08/17  Yes [name], MD  aspirin EC 81 MG tablet Take 81 mg by mouth at bedtime.   Yes [provider]  atorvastatin (LIPITOR) 40 MG tablet TAKE 1 TABLET BY MOUTH ONCE DAILY 10/02/17  Yes [name], MD  DESCOVY 200-25 MG tablet Take 1 tablet by mouth at bedtime.  03/19/17  Yes [provider]  fenofibrate 160 MG tablet Take 0.5 tablets (80 mg total) by mouth at bedtime. 09/03/17  Yes [name], MD  Ferrous Gluconate (IRON 27 PO) Take 1 tablet by mouth every other day.    Yes [provider]  hydrochlorothiazide (HYDRODIURIL) 25 MG tablet Take 1 tablet (25 mg total) by mouth daily. Patient taking differently: Take 25 mg by mouth every other day.  06/02/17  Yes [name], MD  LANTUS SOLOSTAR 100 UNIT/ML Solostar Pen Inject 55 Units into the skin at bedtime. 01/05/18  Yes [name], MD  metoprolol tartrate (LOPRESSOR) 100 MG tablet Take 100 mg by mouth 2 (two) times daily.  01/26/17  Yes [provider]  nystatin (MYCOSTATIN/NYSTOP) powder Apply topically 4 (four) times daily. Patient taking differently: Apply topically 4 (four) times daily as needed (for irritation).  05/28/17  Yes [name], MD  sertraline (ZOLOFT) 100 MG tablet Take 100 mg by mouth every morning. 12/29/17  Yes [provider]  sertraline (ZOLOFT) 50 MG tablet Take 75 mg by mouth at bedtime. 11/16/17  Yes [provider]  TIVICAY 50 MG tablet Take 50 mg by mouth daily.  03/19/17  Yes [provider]  TRULICITY 1.5 ZO/1.0RU SOPN  INJECT 1 PEN (1.5MG ) SUBCUTANEOUSLY ONCE A WEEK ON SUNDAY 12/29/17  Yes [name], MD  chlorhexidine (HIBICLENS) 4 % external liquid Apply daily as needed topically. Patient not taking: Reported on 01/18/2018 06/23/17   [name], MD  doxycycline (VIBRA-TABS) 100 MG tablet Take 1  tablet (100 mg total) by mouth 2 (two) times daily. Patient not taking: Reported on 01/18/2018 11/18/17   [name], MD  hydrocerin (EUCERIN) CREA Apply 1 application topically 2 (two) times daily. Patient not taking: Reported on 01/18/2018 04/01/17   Kalman Drape, PA  traMADol (ULTRAM) 50 MG tablet Take 1 tablet (50 mg total) by mouth every 6 (six) hours as needed for moderate pain or severe pain. Patient not taking: Reported on 01/18/2018 09/28/17   Marin Olp, MD   Social History   Socioeconomic History  . Marital status: Single    Spouse name: Not on file  . Number of children: Not on file  . Years of education: Not on file  . Highest education level: Not on file  Occupational History  . Not on file  Social Needs  . Financial resource strain: Not on file  . Food insecurity:    Worry: Not on file  Inability: Not on file  . Transportation needs:    Medical: Not on file    Non-medical: Not on file  Tobacco Use  . Smoking status: Never Smoker  . Smokeless tobacco: Never Used  Substance and Sexual Activity  . Alcohol use: Yes    Alcohol/week: 1.2 oz    Types: 1 Glasses of wine, 1 Cans of beer per week    Comment: rare  . Drug use: No  . Sexual activity: Never  Lifestyle  . Physical activity:    Days per week: Not on file    Minutes per session: Not on file  . Stress: Not on file  Relationships  . Social connections:    Talks on phone: Not on file    Gets together: Not on file    Attends religious service: Not on file    Active member of club or organization: Not on file    Attends meetings of clubs or organizations: Not on file    Relationship status: Not on file  . Intimate partner violence:    Fear of current or ex partner: Not on file    Emotionally abused: Not on file    Physically abused: Not on file    Forced sexual activity: Not on file  Other Topics Concern  . Not on file  Social History Narrative  . Not on file     Family History    Problem Relation Age of Onset  . Arthritis Mother   . Hypertension Mother   . Alcohol abuse Father   . Cancer Father   . Diabetes Father   . Early death Father   . Hyperlipidemia Father   . Hypertension Father   . Alcohol abuse Sister   . Heart attack Maternal Grandmother   . Heart disease Maternal Grandmother   . Hypertension Maternal Grandmother   . Cancer Maternal Grandfather        lung  . Cancer Paternal Grandmother   . Diabetes Paternal Grandmother     ROS: [x]  Positive   [ ]  Negative   [ ]  All sytems reviewed and are negative  Cardiac: []  chest pain/pressure []  palpitations []  SOB lying flat []  DOE  Vascular: [x]  pain in legs while walking []  pain in legs at rest []  pain in legs at night [x]  non-healing ulcers/gangrene []  hx of DVT []  swelling in legs  Pulmonary: []  productive cough []  asthma/wheezing []  home O2  Neurologic: []  weakness in []  arms []  legs []  numbness in []  arms []  legs [x]  hx of CVA []  mini stroke [] difficulty speaking or slurred speech []  temporary loss of vision in one eye []  dizziness  Hematologic: []  hx of cancer []  bleeding problems []  problems with blood clotting easily  Infectious: [x]  HIV  Endocrine:   [x]  diabetes []  thyroid disease  GI []  vomiting blood []  blood in stool  GU: [x]  acute on CKD III []  HD--[]  M/W/F or []  T/T/S []  burning with urination []  blood in urine  Psychiatric: [x]  anxiety [x]  depression  Musculoskeletal: []  arthritis []  joint pain  Integumentary: []  rashes [x]  ulcers  Constitutional: []  fever []  chills   Physical Examination  Vitals:   01/19/18 0135 01/19/18 0627  BP: 101/81 135/87  Pulse: 63 67  Resp: 20 18  Temp: 98.2 F (36.8 C) 98.8 F (37.1 C)  SpO2: 100% 98%   Body mass index is 34.17 kg/m.  General:  WDWN in NAD Gait: Not observed HENT: WNL, normocephalic Pulmonary: normal non-labored  breathing, without Rales, rhonchi,  wheezing Cardiac: regular,  without  Murmurs, rubs or gallops; without carotid bruits Abdomen:  soft, NT/ND, no masses Skin: without rashes Vascular Exam/Pulses:  Right Left  Radial 2+ (normal) 2+ (normal)  Ulnar 1+ (weak) Unable to palpate  Popliteal 2+ (normal) 2+ (normal)  DP +doppler signal +doppler signal  PT +doppler signal>DP +doppler signal>DP  Peroneal +doppler signal +doppler signal   Extremities:  Right foot:      Musculoskeletal: no muscle wasting or atrophy  Neurologic: A&O X 3;  No focal weakness or paresthesias are detected; speech is fluent/normal Psychiatric:  The pt has Normal affect.  CBC    Component Value Date/Time   WBC 26.8 (H) 01/19/2018 0616   RBC 3.17 (L) 01/19/2018 0616   HGB 9.8 (L) 01/19/2018 0616   HCT 28.3 (L) 01/19/2018 0616   PLT 353 01/19/2018 0616   MCV 89.3 01/19/2018 0616   MCH 30.9 01/19/2018 0616   MCHC 34.6 01/19/2018 0616   RDW 13.7 01/19/2018 0616   LYMPHSABS 1.1 01/19/2018 0616   MONOABS 1.3 (H) 01/19/2018 0616   EOSABS 0.0 01/19/2018 0616   BASOSABS 0.0 01/19/2018 0616    BMET    Component Value Date/Time   NA 133 (L) 01/19/2018 0616   K 2.8 (L) 01/19/2018 0616   CL 98 (L) 01/19/2018 0616   CO2 26 01/19/2018 0616   GLUCOSE 76 01/19/2018 0616   BUN 32 (H) 01/19/2018 0616   CREATININE 1.98 (H) 01/19/2018 0616   CREATININE 1.20 04/23/2017 1537   CALCIUM 7.7 (L) 01/19/2018 0616   GFRNONAA 39 (L) 01/19/2018 0616   GFRNONAA 73 04/23/2017 1537   GFRAA 46 (L) 01/19/2018 0616   GFRAA 85 04/23/2017 1537    Non-Invasive Vascular Imaging:   Right foot xray 01/18/18: IMPRESSION: Mid and forefoot subcutaneous gas most consistent with necrotizing fasciitis. Advanced vascular calcifications. No radiographic evidence of osteomyelitis.   Statin:  Yes.   Beta Blocker:  Yes.   Aspirin:  Yes.   ACEI:  No. ARB:  No. CCB use:  Yes Other antiplatelets/anticoagulants:  No.    ASSESSMENT/PLAN: This is a 47 y.o. male with ischemic right foot with  uncontrolled diabetes and AKI on CKD III  -[name] is recommending right transtibial amputation.  Pt does have palpable popliteal pulse.  He will most likely need arteriogram, however, this is complicated by his AKI on CKD III.   His creatinine on admit was 2.19 and is down to 1.98 with hydration.  May be able to use CO2 with minimal contrast.  Plain film xray shows advanced calcification of his arteries to mid foot.   -Dr. Scot Dock will be by to see pt  -Dr. Donzetta Matters may be able to perform arteriogram tomorrow.  Leontine Locket, PA-C [facility] [phone]

## 2018-01-20 ENCOUNTER — Encounter (HOSPITAL_COMMUNITY): Payer: Self-pay | Admitting: Vascular Surgery

## 2018-01-20 ENCOUNTER — Other Ambulatory Visit (INDEPENDENT_AMBULATORY_CARE_PROVIDER_SITE_OTHER): Payer: Self-pay | Admitting: Orthopedic Surgery

## 2018-01-20 ENCOUNTER — Encounter (HOSPITAL_COMMUNITY): Payer: PPO

## 2018-01-20 ENCOUNTER — Encounter (HOSPITAL_COMMUNITY)
Admission: EM | Disposition: A | Discharge status: Skilled Nursing Facility | Payer: Self-pay | Source: Home / Self Care | Attending: Internal Medicine

## 2018-01-20 DIAGNOSIS — N189 Chronic kidney disease, unspecified: Secondary | ICD-10-CM

## 2018-01-20 DIAGNOSIS — I96 Gangrene, not elsewhere classified: Secondary | ICD-10-CM

## 2018-01-20 DIAGNOSIS — N179 Acute kidney failure, unspecified: Secondary | ICD-10-CM

## 2018-01-20 DIAGNOSIS — B20 Human immunodeficiency virus [HIV] disease: Secondary | ICD-10-CM

## 2018-01-20 HISTORY — PX: ABDOMINAL AORTOGRAM W/LOWER EXTREMITY: CATH118223

## 2018-01-20 LAB — BASIC METABOLIC PANEL
Anion gap: 9 (ref 5–15)
BUN: 22 mg/dL — AB (ref 6–20)
CALCIUM: 7.8 mg/dL — AB (ref 8.9–10.3)
CHLORIDE: 105 mmol/L (ref 101–111)
CO2: 24 mmol/L (ref 22–32)
CREATININE: 1.56 mg/dL — AB (ref 0.61–1.24)
GFR calc Af Amer: >60 mL/min (ref >60)
GFR, EST NON AFRICAN AMERICAN: 52 mL/min — AB (ref >60)
Glucose, Bld: 93 mg/dL (ref 65–99)
Potassium: 3.3 mmol/L — ABNORMAL LOW (ref 3.5–5.1)
SODIUM: 138 mmol/L (ref 135–145)

## 2018-01-20 LAB — CBC
HCT: 27.5 % — ABNORMAL LOW (ref 39.0–52.0)
Hemoglobin: 9.3 g/dL — ABNORMAL LOW (ref 13.0–17.0)
MCH: 30.3 pg (ref 26.0–34.0)
MCHC: 33.8 g/dL (ref 30.0–36.0)
MCV: 89.6 fL (ref 78.0–100.0)
PLATELETS: 357 10*3/uL (ref 150–400)
RBC: 3.07 MIL/uL — ABNORMAL LOW (ref 4.22–5.81)
RDW: 14.2 % (ref 11.5–15.5)
WBC: 24.6 10*3/uL — AB (ref 4.0–10.5)

## 2018-01-20 LAB — GLUCOSE, CAPILLARY
GLUCOSE-CAPILLARY: 111 mg/dL — AB (ref 65–99)
GLUCOSE-CAPILLARY: 89 mg/dL (ref 65–99)
GLUCOSE-CAPILLARY: 86 mg/dL (ref 65–99)
Glucose-Capillary: 90 mg/dL (ref 65–99)
Glucose-Capillary: 101 mg/dL — ABNORMAL HIGH (ref 65–99)
Glucose-Capillary: 157 mg/dL — ABNORMAL HIGH (ref 65–99)

## 2018-01-20 LAB — MAGNESIUM: MAGNESIUM: 2 mg/dL (ref 1.7–2.4)

## 2018-01-20 SURGERY — ABDOMINAL AORTOGRAM W/LOWER EXTREMITY
Anesthesia: LOCAL

## 2018-01-20 MED ORDER — SODIUM CHLORIDE 0.9 % WEIGHT BASED INFUSION: 1.0000 mL/kg/h | INTRAVENOUS | Status: AC

## 2018-01-20 MED ORDER — FENOFIBRATE 160 MG PO TABS
80.0000 mg | ORAL_TABLET | Freq: Every day | ORAL | Status: DC
  Administered 2018-01-21 – 2018-01-24 (×4): 80 mg via ORAL
  Filled 2018-01-20 (×4): qty 1

## 2018-01-20 MED ORDER — IODIXANOL 320 MG/ML IV SOLN
INTRAVENOUS | Status: DC | PRN
  Administered 2018-01-20: 30 mL via INTRA_ARTERIAL

## 2018-01-20 MED ORDER — HEPARIN (PORCINE) IN NACL 2-0.9 UNITS/ML
INTRAMUSCULAR | Status: AC | PRN
  Administered 2018-01-20: 500 mL

## 2018-01-20 MED ORDER — LIDOCAINE HCL (PF) 1 % IJ SOLN
INTRAMUSCULAR | Status: DC | PRN
  Administered 2018-01-20: 30 mL

## 2018-01-20 MED ORDER — FENTANYL CITRATE (PF) 100 MCG/2ML IJ SOLN
INTRAMUSCULAR | Status: AC
  Filled 2018-01-20: qty 2

## 2018-01-20 MED ORDER — LIDOCAINE HCL (PF) 1 % IJ SOLN
INTRAMUSCULAR | Status: AC
  Filled 2018-01-20: qty 30

## 2018-01-20 MED ORDER — ACETAMINOPHEN 325 MG PO TABS: 650.0000 mg | ORAL_TABLET | ORAL | Status: DC | PRN

## 2018-01-20 MED ORDER — FENTANYL CITRATE (PF) 100 MCG/2ML IJ SOLN
INTRAMUSCULAR | Status: DC | PRN
  Administered 2018-01-20: 50 ug via INTRAVENOUS

## 2018-01-20 MED ORDER — LABETALOL HCL 5 MG/ML IV SOLN: 10.0000 mg | INTRAVENOUS | Status: DC | PRN

## 2018-01-20 MED ORDER — ONDANSETRON HCL 4 MG/2ML IJ SOLN: 4.0000 mg | Freq: Four times a day (QID) | INTRAMUSCULAR | Status: DC | PRN

## 2018-01-20 MED ORDER — SODIUM CHLORIDE 0.9 % IV SOLN
250.0000 mL | INTRAVENOUS | Status: DC | PRN
  Administered 2018-01-21: 250 mL via INTRAVENOUS

## 2018-01-20 MED ORDER — SODIUM CHLORIDE 0.9% FLUSH
3.0000 mL | Freq: Two times a day (BID) | INTRAVENOUS | Status: DC
  Administered 2018-01-21 – 2018-01-25 (×6): 3 mL via INTRAVENOUS

## 2018-01-20 MED ORDER — HEPARIN (PORCINE) IN NACL 1000-0.9 UT/500ML-% IV SOLN
INTRAVENOUS | Status: AC
  Filled 2018-01-20: qty 500

## 2018-01-20 MED ORDER — MIDAZOLAM HCL 2 MG/2ML IJ SOLN
INTRAMUSCULAR | Status: AC
  Filled 2018-01-20: qty 2

## 2018-01-20 MED ORDER — SODIUM CHLORIDE 0.9% FLUSH: 3.0000 mL | INTRAVENOUS | Status: DC | PRN

## 2018-01-20 MED ORDER — MIDAZOLAM HCL 2 MG/2ML IJ SOLN
INTRAMUSCULAR | Status: DC | PRN
  Administered 2018-01-20: 1 mg via INTRAVENOUS

## 2018-01-20 MED ORDER — HYDRALAZINE HCL 20 MG/ML IJ SOLN: 5.0000 mg | INTRAMUSCULAR | Status: DC | PRN

## 2018-01-20 SURGICAL SUPPLY — 11 items
CATH OMNI FLUSH 5F 65CM (CATHETERS) ×2 IMPLANT
CATH TEMPO AQUA 5F 100CM (CATHETERS) ×2 IMPLANT
COVER PRB 48X5XTLSCP FOLD TPE (BAG) ×1 IMPLANT
COVER PROBE 5X48 (BAG) ×1
GUIDEWIRE ANGLED .035X260CM (WIRE) ×2 IMPLANT
KIT MICROPUNCTURE NIT STIFF (SHEATH) ×2 IMPLANT
KIT PV (KITS) ×2 IMPLANT
SHEATH AVANTI 11CM 5FR (SHEATH) ×2 IMPLANT
TRANSDUCER W/STOPCOCK (MISCELLANEOUS) ×2 IMPLANT
TRAY PV CATH (CUSTOM PROCEDURE TRAY) ×2 IMPLANT
WIRE BENTSON .035X145CM (WIRE) ×2 IMPLANT

## 2018-01-20 NOTE — Op Note (Signed)
    Patient name: Francesco Provencal MRN: [id number] DOB: 1970-10-04 Sex: male  01/20/2018 Pre-operative Diagnosis: Critical right lower extremity ischemia with wounds Post-operative diagnosis:  Same Surgeon:  Erlene Quan C. Donzetta Matters, MD Procedure Performed: 1.  Ultrasound-guided cannulation left common femoral artery 2.  CO2 aortogram with right lower extremity angiogram 3.  Moderate sedation with fentanyl Versed for 30 minutes.  Indications: 47 year old male with diabetes has wound on his right foot.  He is indicated for angiogram with possible intervention of the right lower extremity.  He is admitted with acute kidney injury as well as infection we will use CO2 as far down the right lower extremity is tolerable.  Findings: The aorta and iliac segments are free of disease.  There is no flow-limiting stenosis throughout the right lower extremity SFA popliteal.  Below the knee there are 3 vessels with the peroneal then going to the ankle and the posterior tibial going all the way to the distal arch.  There does not appear to be very significant small vessel disease.  No intervention was undertaken.   Procedure:  The patient was identified in the holding area and taken to room 8.  The patient was then placed supine on the table and prepped and draped in the usual sterile fashion.  A time out was called.  Ultrasound was used to evaluate the left common femoral artery.  It was patent .  A digital ultrasound image was saved to the chart.  Left common femoral artery was cannulated with direct ultrasound guidance and visualization with micropuncture needle followed by wire and sheath.  We then placed a Bentson wire and a short 5 [place] sheath.  Omni Flush catheter was placed to the level of L1 and CO2 aortogram was performed.  We then crossed the bifurcation perform right lower extremity angiogram with CO2 to the level of the popliteal which was all noted to be patent.  We then exchanged with a Glidewire for a  long straight catheter to the level of the knee and performed angiogram below the knee.  Findings above we elected to not intervene.  Catheter was pulled.  Sheath will be pulled in postoperative holding.  Patient tolerated procedure without immediate comp occasion.  Next  Contrast: 20 cc.   Donnavin Vandenbrink C. Donzetta Matters, MD [facility] [place] Office: [phone] Pager: [phone]

## 2018-01-20 NOTE — Progress Notes (Signed)
VASCULAR SURGERY:  I have reviewed the arteriogram that was done today by Dr. Servando Snare.  The patient has no significant infrainguinal arterial occlusive disease.  The patient has three-vessel runoff to the foot with small vessel disease.  Thus there are no options from a vascular standpoint.  I suspect that he will need a transtibial amputation of the wound progresses as previously recommended by [name].  Vascular surgery will be available as needed.  Francisco Mayo, MD, [place] [phone] Office: [phone]

## 2018-01-20 NOTE — Progress Notes (Signed)
Patient arrived from the cath lab on a stretcher, assessment completed see flowsheet, placed on tele ccmd notified, patient oriented to room and staff, bed in lowest position, call bell within reach will continue to monitor.

## 2018-01-20 NOTE — Progress Notes (Signed)
Site area:  Left groin fa sheath Site Prior to Removal:  Level 0 Pressure Applied For: 20 minutes Manual:   yes Patient Status During Pull:  stable Post Pull Site:  Level 0 Post Pull Instructions Given:  yes Post Pull Pulses Present: left dp dopplered Dressing Applied:  Gauze and tegaderm Bedrest begins @ 6168 Comments:

## 2018-01-20 NOTE — Progress Notes (Addendum)
PROGRESS NOTE   Francisco Hutchinson  TTS:[id number]    DOB: 02-25-1971    DOA: 01/18/2018  PCP: [name], MD   I have briefly reviewed patients previous medical records in [facility].  Brief Narrative:  47 year old male with PMH of DM 2, HIV, HTN, CVA, anxiety and depression, presented to ED 6/10 due to worsening right foot wound, progressive darkening of toes.  Admitted for gangrene of right foot.  Orthopedics and vascular surgery consulted.  Underwent angiogram by vascular surgery 6/12 without significant blockage.  Await orthopedics to perform transtibial amputation.   Assessment & Plan:   Principal Problem:   Necrotizing fasciitis ([place]) Active Problems:   CKD (chronic kidney disease), stage III ([place])   Type 2 diabetes mellitus with diabetic chronic kidney disease ([place])   HIV disease ([place])   Hypertension associated with diabetes ([place])   Gangrene of right foot ([place])   Severe protein-calorie malnutrition ([place])   Gangrene of right forefoot - Orthopedic/[name] was consulted and recommended PAD evaluation by vascular surgery prior to transtibial amputation. - Vascular surgery consulted and patient underwent arteriogram 6/12 without significant blockage. - Await orthopedic input regarding surgery> with [name] who plans to speak with patient and schedule for 6/14. - Continue IV vancomycin and Zosyn.  After discussing with [name], not necrotizing fasciitis, discontinued clindamycin.  Type II DM with peripheral neuropathy and renal complications Had hypoglycemic episode 6/11.  Continue reduced dose of Lantus 15 units at bedtime and SSI.  Good inpatient control.  Essential hypertension:  Controlled.   Holding HCTZ due to acute kidney injury.  Continue metoprolol.  Holding amlodipine as well.  Acute on stage III chronic kidney disease:  Presented with creatinine of 2.19 which is improved to 1.56 after hydration.  HCTZ on hold.  Continue IV fluids and follow BMP in  a.m.  Baseline creatinine likely 1.6.  HIV Continue ART.  Outpatient follow-up with ID.  Cannot recall last CD4 count.  Anemia, normocytic Some of this may be dilutional complicating anemia of chronic disease and chronic kidney disease.  Stable.  Follow CBC in a.m. and transfuse if hemoglobin 7 or less.  Leukocytosis Secondary to right foot dry gangrene/infectious etiology.  Continue current antibiotics and follow CBC daily.  Hypokalemia:  Replace and follow.  Magnesium 2.  Hyperlipidemia Continue atorvastatin, fenofibrate.  Depression:  Continue sertraline.  Severe protein calorie malnutrition RD consulted.  DVT prophylaxis: SCDs Code Status: Full Family Communication: None at bedside Disposition: To be determined pending clinical improvement   Consultants:  Orthopedics/[name] Vascular surgery  Procedures:  Arteriogram 6/2  Antimicrobials:  IV vancomycin and Zosyn.  Discontinued clindamycin.   Subjective: Patient was seen this morning prior to procedure.  Denied pain or symptoms in right lower extremity.  Denied any other complaints.  ROS: As above  Objective:  Vitals:   01/20/18 1340 01/20/18 1345 01/20/18 1350 01/20/18 1420  BP: 120/74 126/70 127/81 128/86  Pulse: 80 80 80 (!) 59  Resp: (!) 26 (!) 25 (!) 25 17  Temp:    (!) 97.5 F (36.4 C)  TempSrc:    Axillary  SpO2: 95% 97% 96% 98%  Weight:      Height:        Examination:  General exam: Pleasant young male, moderately built and nourished lying comfortably propped up in bed. Respiratory system: Clear to auscultation. Respiratory effort normal. Cardiovascular system: S1 & S2 heard, RRR. No JVD, murmurs, rubs, gallops or clicks. No pedal edema.  Telemetry personally reviewed: Sinus rhythm. Gastrointestinal system: Abdomen is nondistended, soft and nontender. No organomegaly or masses felt. Normal bowel sounds heard. Central nervous system: Alert and oriented. No focal neurological  deficits. Extremities: Symmetric 5 x 5 power.  Right foot exam findings as per pictures below taken 01/18/2018.  No active drainage. Psychiatry: Judgement and insight appear normal. Mood & affect appropriate.         Data Reviewed: I have personally reviewed following labs and imaging studies  CBC: Recent Labs  Lab 01/18/18 1756 01/19/18 0616 01/20/18 0831  WBC 31.3* 26.8* 24.6*  NEUTROABS 29.8* 24.4*  --   HGB 11.4* 9.8* 9.3*  HCT 33.3* 28.3* 27.5*  MCV 89.3 89.3 89.6  PLT 439* 353 761   Basic Metabolic Panel: Recent Labs  Lab 01/18/18 1756 01/19/18 0616 01/20/18 0813 01/20/18 0831  NA 133* 133*  --  138  K 3.2* 2.8*  --  3.3*  CL 91* 98*  --  105  CO2 28 26  --  24  GLUCOSE 107* 76  --  93  BUN 33* 32*  --  22*  CREATININE 2.19* 1.98*  --  1.56*  CALCIUM 8.5* 7.7*  --  7.8*  MG  --   --  2.0  --    Liver Function Tests: Recent Labs  Lab 01/18/18 1756 01/19/18 0616  AST 33 28  ALT 18 17  ALKPHOS 122 111  BILITOT 0.9 0.8  PROT 8.4* 6.4*  ALBUMIN 2.2* 1.7*   Coagulation Profile: No results for input(s): INR, PROTIME in the last 168 hours. Cardiac Enzymes: No results for input(s): CKTOTAL, CKMB, CKMBINDEX, TROPONINI in the last 168 hours. HbA1C: No results for input(s): HGBA1C in the last 72 hours. CBG: Recent Labs  Lab 01/19/18 2347 01/20/18 0453 01/20/18 0844 01/20/18 1324 01/20/18 1455  GLUCAP 142* 111* 89 90 86    Recent Results (from the past 240 hour(s))  Blood Culture (routine x 2)     Status: None (Preliminary result)   Collection Time: 01/18/18  7:23 PM  Result Value Ref Range Status   Specimen Description BLOOD LEFT ANTECUBITAL  Final   Special Requests   Final    BOTTLES DRAWN AEROBIC AND ANAEROBIC Blood Culture adequate volume   Culture   Final    NO GROWTH 2 DAYS Performed at [facility], [address], [place], [place] [address]    Report Status PENDING  Incomplete  Blood Culture (routine x 2)     Status: None  (Preliminary result)   Collection Time: 01/18/18  7:23 PM  Result Value Ref Range Status   Specimen Description BLOOD LEFT HAND  Final   Special Requests   Final    BOTTLES DRAWN AEROBIC AND ANAEROBIC Blood Culture adequate volume   Culture   Final    NO GROWTH 2 DAYS Performed at [facility], [place] [address], [place],  [address]    Report Status PENDING  Incomplete  MRSA PCR Screening     Status: None   Collection Time: 01/19/18  1:02 PM  Result Value Ref Range Status   MRSA by PCR NEGATIVE NEGATIVE Final    Comment:        The GeneXpert MRSA Assay (FDA approved for NASAL specimens only), is one component of a comprehensive MRSA colonization surveillance program. It is not intended to diagnose MRSA infection nor to guide or monitor treatment for MRSA infections. Performed at [facility], [place]  [address] [place], [place] [address]          Radiology Studies: Dg Chest Port 1 View  Result Date: 01/18/2018 CLINICAL DATA:  RIGHT foot gangrene. EXAM: PORTABLE CHEST 1 VIEW COMPARISON:  None. FINDINGS: Cardiac silhouette is mildly enlarged. Mediastinal silhouette is not suspicious. Prominent RIGHT pleural fat. No pleural effusion or focal consolidation. No pneumothorax. Soft tissue planes and included osseous structures are non suspicious. IMPRESSION: Mild cardiomegaly.  No acute pulmonary process. Electronically Signed   By: Elon Alas M.D.   On: 01/18/2018 18:45   Dg Foot Complete Right  Result Date: 01/18/2018 CLINICAL DATA:  RIGHT foot gangrene.  Potential abscess. EXAM: RIGHT FOOT COMPLETE - 3+ VIEW COMPARISON:  None. FINDINGS: There is no evidence of fracture or dislocation. There is no evidence of arthropathy or other focal bone abnormality. Soft tissue swelling with subcutaneous gas mid and forefoot. Advanced vascular calcifications. IMPRESSION: Mid and forefoot subcutaneous gas most consistent with necrotizing fasciitis. Advanced vascular  calcifications. No radiographic evidence of osteomyelitis. Acute findings discussed with and reconfirmed by Dr.MELANIE BELFI on 01/18/2018 at 6:47 pm. Electronically Signed   By: Elon Alas M.D.   On: 01/18/2018 18:47        Scheduled Meds: . atorvastatin  40 mg Oral Daily  . dolutegravir  50 mg Oral Daily  . emtricitabine-tenofovir AF  1 tablet Oral QHS  . fenofibrate  80 mg Oral QHS  . ferrous gluconate  324 mg Oral Q breakfast  . insulin aspart  0-9 Units Subcutaneous TID WC  . insulin glargine  15 Units Subcutaneous Q2200  . metoprolol tartrate  100 mg Oral BID  . sertraline  100 mg Oral Daily  . sertraline  75 mg Oral QHS  . sodium chloride flush  3 mL Intravenous Q12H   Continuous Infusions: . sodium chloride 75 mL/hr at 01/19/18 1846  . sodium chloride    . sodium chloride 1 mL/kg/hr (01/20/18 1350)  . clindamycin (CLEOCIN) IV Stopped (01/20/18 9417)  . piperacillin-tazobactam (ZOSYN)  IV Stopped (01/20/18 0858)  . vancomycin Stopped (01/19/18 2132)     LOS: 2 days     Vernell Leep, MD, FACP, [facility]. [facility] Pager [phone]  If 7PM-7AM, please contact night-coverage [email or web address] Password [email or web address] 01/20/2018, 3:00 PM

## 2018-01-20 NOTE — Progress Notes (Signed)
Patient picked up by transportation for procedure. Patient's glasses and ring are left at bedside. Patient is aware.

## 2018-01-20 NOTE — Progress Notes (Signed)
  Progress Note    01/20/2018 11:58 AM Day of Surgery  Subjective:  Right foot pain  Vitals:   01/19/18 1958 01/20/18 1150  BP: (!) 145/86   Pulse: 75   Resp:    Temp: 99.4 F (37.4 C)   SpO2: 99% 100%    Physical Exam: Awake alert and oriented Nonlabored respirations Palpable femoral pulses bilaterally  CBC    Component Value Date/Time   WBC 24.6 (H) 01/20/2018 0831   RBC 3.07 (L) 01/20/2018 0831   HGB 9.3 (L) 01/20/2018 0831   HCT 27.5 (L) 01/20/2018 0831   PLT 357 01/20/2018 0831   MCV 89.6 01/20/2018 0831   MCH 30.3 01/20/2018 0831   MCHC 33.8 01/20/2018 0831   RDW 14.2 01/20/2018 0831   LYMPHSABS 1.1 01/19/2018 0616   MONOABS 1.3 (H) 01/19/2018 0616   EOSABS 0.0 01/19/2018 0616   BASOSABS 0.0 01/19/2018 0616    BMET    Component Value Date/Time   NA 138 01/20/2018 0831   K 3.3 (L) 01/20/2018 0831   CL 105 01/20/2018 0831   CO2 24 01/20/2018 0831   GLUCOSE 93 01/20/2018 0831   BUN 22 (H) 01/20/2018 0831   CREATININE 1.56 (H) 01/20/2018 0831   CREATININE 1.20 04/23/2017 1537   CALCIUM 7.8 (L) 01/20/2018 0831   GFRNONAA 52 (L) 01/20/2018 0831   GFRNONAA 73 04/23/2017 1537   GFRAA >60 01/20/2018 0831   GFRAA 85 04/23/2017 1537    INR No results found for: INR   Intake/Output Summary (Last 24 hours) at 01/20/2018 1158 Last data filed at 01/20/2018 1122 Gross per 24 hour  Intake 1425 ml  Output 300 ml  Net 1125 ml     Assessment:  47 y.o. male is here with right foot gangrenous changes.    Plan: Aortogram bilateral runoff possible intervention on right today.   Farrie Sann C. Donzetta Matters, MD [facility] Office: [phone] Pager: [phone]  01/20/2018 11:58 AM

## 2018-01-21 ENCOUNTER — Inpatient Hospital Stay (HOSPITAL_COMMUNITY): Payer: PPO

## 2018-01-21 DIAGNOSIS — I96 Gangrene, not elsewhere classified: Secondary | ICD-10-CM

## 2018-01-21 LAB — GLUCOSE, CAPILLARY
GLUCOSE-CAPILLARY: 151 mg/dL — AB (ref 65–99)
GLUCOSE-CAPILLARY: 287 mg/dL — AB (ref 65–99)
Glucose-Capillary: 240 mg/dL — ABNORMAL HIGH (ref 65–99)
Glucose-Capillary: 193 mg/dL — ABNORMAL HIGH (ref 65–99)

## 2018-01-21 LAB — CBC
HEMATOCRIT: 27.9 % — AB (ref 39.0–52.0)
HEMOGLOBIN: 9.3 g/dL — AB (ref 13.0–17.0)
MCH: 30.3 pg (ref 26.0–34.0)
MCHC: 33.3 g/dL (ref 30.0–36.0)
MCV: 90.9 fL (ref 78.0–100.0)
Platelets: 346 10*3/uL (ref 150–400)
RBC: 3.07 MIL/uL — AB (ref 4.22–5.81)
RDW: 14.5 % (ref 11.5–15.5)
WBC: 21.7 10*3/uL — ABNORMAL HIGH (ref 4.0–10.5)

## 2018-01-21 LAB — BASIC METABOLIC PANEL
ANION GAP: 6 (ref 5–15)
BUN: 20 mg/dL (ref 6–20)
CHLORIDE: 108 mmol/L (ref 101–111)
CO2: 23 mmol/L (ref 22–32)
Calcium: 7.3 mg/dL — ABNORMAL LOW (ref 8.9–10.3)
Creatinine, Ser: 1.5 mg/dL — ABNORMAL HIGH (ref 0.61–1.24)
GFR calc non Af Amer: 55 mL/min — ABNORMAL LOW (ref >60)
Glucose, Bld: 170 mg/dL — ABNORMAL HIGH (ref 65–99)
POTASSIUM: 3.4 mmol/L — AB (ref 3.5–5.1)
SODIUM: 137 mmol/L (ref 135–145)

## 2018-01-21 MED ORDER — POTASSIUM CHLORIDE CRYS ER 20 MEQ PO TBCR
40.0000 meq | EXTENDED_RELEASE_TABLET | Freq: Once | ORAL | Status: AC
  Administered 2018-01-21: 40 meq via ORAL
  Filled 2018-01-21: qty 2

## 2018-01-21 MED ORDER — CHLORHEXIDINE GLUCONATE 4 % EX LIQD
60.0000 mL | Freq: Once | CUTANEOUS | Status: AC
  Administered 2018-01-21: 4 via TOPICAL
  Filled 2018-01-21: qty 60

## 2018-01-21 NOTE — Progress Notes (Signed)
PROGRESS NOTE   Francisco Hutchinson  HYI:[id number]    DOB: July 30, 1971    DOA: 01/18/2018  PCP: [name], MD   I have briefly reviewed patients previous medical records in [facility].  Brief Narrative:  47 year old male with PMH of DM 2, HIV, HTN, CVA, anxiety and depression, presented to ED 6/10 due to worsening right foot wound, progressive darkening of toes.  Admitted for gangrene of right foot.  Orthopedics and vascular surgery consulted.  Underwent angiogram by vascular surgery 6/12 without significant blockage.  Await orthopedics to perform transtibial amputation.   Assessment & Plan:   Principal Problem:   Necrotizing fasciitis ([place]) Active Problems:   CKD (chronic kidney disease), stage III ([facility])   Type 2 diabetes mellitus with diabetic chronic kidney disease ([place])   HIV disease ([place])   Hypertension associated with diabetes ([place])   Gangrene of right foot ([place])   Severe protein-calorie malnutrition ([place])   Gangrene of right forefoot - Orthopedic/[name] was consulted and recommended PAD evaluation by vascular surgery prior to transtibial amputation. - Vascular surgery consulted and patient underwent arteriogram 6/12 without significant blockage. -[name] plans right transtibial amputation 6/14. -Continue IV vancomycin and Zosyn.  Clindamycin discontinued.  Type II DM with peripheral neuropathy and renal complications Had hypoglycemic episode 6/11.  Continue reduced dose of Lantus 15 units at bedtime and SSI.  CBGs now starting to increase in the 200s and may need to increase Lantus but will hold off until after surgery.  Essential hypertension:  Controlled.   Holding HCTZ due to acute kidney injury.  Continue metoprolol.  Holding amlodipine as well.  Acute on stage III chronic kidney disease:  Presented with creatinine of 2.19 which is improved to 1.56 after hydration.  HCTZ on hold.  Continue IV fluids and follow BMP in a.m.  Baseline creatinine likely  1.6.  Creatinine stable in the 1.5 range for the last 2 days.  HIV Continue ART.  Outpatient follow-up with ID.  Cannot recall last CD4 count.  Anemia, normocytic Some of this may be dilutional complicating anemia of chronic disease and chronic kidney disease.  Hemoglobin stable.  Follow CBC in a.m. and transfuse if hemoglobin 7 or less.  Leukocytosis Secondary to right foot dry gangrene/infectious etiology.  Continue current antibiotics and follow CBC daily.  Hypokalemia:  Replace and follow.  Magnesium 2.  Hyperlipidemia Continue atorvastatin, fenofibrate.  Depression:  Continue sertraline.  Severe protein calorie malnutrition RD consulted.  DVT prophylaxis: SCDs Code Status: Full Family Communication: Discussed in detail with patient's mother at bedside Disposition: To be determined pending clinical improvement   Consultants:  Orthopedics/[name] Vascular surgery  Procedures:  Arteriogram 6/2  Antimicrobials:  IV vancomycin and Zosyn.  Discontinued clindamycin.   Subjective: Patient denies any complaints.  No pain in right lower extremity.  Reports history of necrotizing fasciitis and right upper extremity 3 to 4 years ago and operated in [place].  Left-handed.  ROS: As above  Objective:  Vitals:   01/21/18 0900 01/21/18 1100 01/21/18 1300 01/21/18 1444  BP:    134/89  Pulse: 76   65  Resp: (!) 22 (!) 30 (!) 27 (!) 23  Temp:    98.2 F (36.8 C)  TempSrc:    Oral  SpO2: 97%   97%  Weight:      Height:        Examination:  General exam: Pleasant young male, moderately built and nourished lying comfortably propped up in bed.  Respiratory system: Clear to auscultation. Respiratory effort normal.  Stable Cardiovascular system: S1 & S2 heard, RRR. No JVD, murmurs, rubs, gallops or clicks. No pedal edema.  Telemetry personally reviewed: Sinus rhythm Gastrointestinal system: Abdomen is nondistended, soft and nontender. No organomegaly or masses felt.  Normal bowel sounds heard.  Stable Central nervous system: Alert and oriented. No focal neurological deficits.  Stable Extremities: Symmetric 5 x 5 power.  Right foot exam findings as per pictures below taken 01/18/2018.  No active drainage.  No change in exam since yesterday.  Right upper extremity scar near the axilla from necrotizing fasciitis.  Right upper extremity with decreased bulk. Psychiatry: Judgement and insight appear normal. Mood & affect appropriate.         Data Reviewed: I have personally reviewed following labs and imaging studies  CBC: Recent Labs  Lab 01/18/18 1756 01/19/18 0616 01/20/18 0831 01/21/18 0411  WBC 31.3* 26.8* 24.6* 21.7*  NEUTROABS 29.8* 24.4*  --   --   HGB 11.4* 9.8* 9.3* 9.3*  HCT 33.3* 28.3* 27.5* 27.9*  MCV 89.3 89.3 89.6 90.9  PLT 439* 353 357 263   Basic Metabolic Panel: Recent Labs  Lab 01/18/18 1756 01/19/18 0616 01/20/18 0813 01/20/18 0831 01/21/18 0411  NA 133* 133*  --  138 137  K 3.2* 2.8*  --  3.3* 3.4*  CL 91* 98*  --  105 108  CO2 28 26  --  24 23  GLUCOSE 107* 76  --  93 170*  BUN 33* 32*  --  22* 20  CREATININE 2.19* 1.98*  --  1.56* 1.50*  CALCIUM 8.5* 7.7*  --  7.8* 7.3*  MG  --   --  2.0  --   --    Liver Function Tests: Recent Labs  Lab 01/18/18 1756 01/19/18 0616  AST 33 28  ALT 18 17  ALKPHOS 122 111  BILITOT 0.9 0.8  PROT 8.4* 6.4*  ALBUMIN 2.2* 1.7*   CBG: Recent Labs  Lab 01/20/18 1629 01/20/18 2145 01/21/18 0626 01/21/18 1119 01/21/18 1615  GLUCAP 101* 157* 151* 240* 287*    Recent Results (from the past 240 hour(s))  Blood Culture (routine x 2)     Status: None (Preliminary result)   Collection Time: 01/18/18  7:23 PM  Result Value Ref Range Status   Specimen Description BLOOD LEFT ANTECUBITAL  Final   Special Requests   Final    BOTTLES DRAWN AEROBIC AND ANAEROBIC Blood Culture adequate volume   Culture   Final    NO GROWTH 3 DAYS Performed at [facility], [address], [place], [place] [address]    Report Status PENDING  Incomplete  Blood Culture (routine x 2)     Status: None (Preliminary result)   Collection Time: 01/18/18  7:23 PM  Result Value Ref Range Status   Specimen Description BLOOD LEFT HAND  Final   Special Requests   Final    BOTTLES DRAWN AEROBIC AND ANAEROBIC Blood Culture adequate volume   Culture   Final    NO GROWTH 3 DAYS Performed at [facility], [place] [address] [place], [place] [address]    Report Status PENDING  Incomplete  MRSA PCR Screening     Status: None   Collection Time: 01/19/18  1:02 PM  Result Value Ref Range Status   MRSA by PCR NEGATIVE NEGATIVE Final    Comment:        The GeneXpert MRSA Assay (FDA  approved for NASAL specimens only), is one component of a comprehensive MRSA colonization surveillance program. It is not intended to diagnose MRSA infection nor to guide or monitor treatment for MRSA infections. Performed at [facility], [place] [address], [place], [place] [address]          Radiology Studies: No results found.      Scheduled Meds: . atorvastatin  40 mg Oral Daily  . chlorhexidine  60 mL Topical Once  . dolutegravir  50 mg Oral Daily  . emtricitabine-tenofovir AF  1 tablet Oral QHS  . fenofibrate  80 mg Oral QHS  . ferrous gluconate  324 mg Oral Q breakfast  . insulin aspart  0-9 Units Subcutaneous TID WC  . insulin glargine  15 Units Subcutaneous Q2200  . metoprolol tartrate  100 mg Oral BID  . sertraline  100 mg Oral Daily  . sertraline  75 mg Oral QHS  . sodium chloride flush  3 mL Intravenous Q12H   Continuous Infusions: . sodium chloride 250 mL (01/21/18 1131)  . piperacillin-tazobactam (ZOSYN)  IV 3.375 g (01/21/18 1143)  . vancomycin Stopped (01/20/18 2345)     LOS: 3 days     Vernell Leep, MD, FACP, [facility]. [facility] Pager [phone]  If 7PM-7AM, please contact night-coverage [email or web address] Password [name] 01/21/2018, 5:15 PM

## 2018-01-21 NOTE — Progress Notes (Signed)
Patient ID: Francisco Hutchinson, male   DOB: [birth year], 47 y.o.   MRN: [id number] Patient is seen in follow-up he is status post arteriogram study yesterday.  Study shows flow to the ankle however patient does have severe microcirculatory disease with  insufficient micro circulation to the midfoot and forefoot.  Discussed with the patient the best option for the gangrenous changes to the midfoot and forefoot is to proceed with a transtibial amputation.  Patient states he understands wished to proceed at this time risks and benefits were discussed.  Plan for right transtibial amputation tomorrow Friday.

## 2018-01-21 NOTE — H&P (View-Only) (Signed)
Patient ID: Francisco Hutchinson, male   DOB: [birth year], 47 y.o.   MRN: [id number] Patient is seen in follow-up he is status post arteriogram study yesterday.  Study shows flow to the ankle however patient does have severe microcirculatory disease with  insufficient micro circulation to the midfoot and forefoot.  Discussed with the patient the best option for the gangrenous changes to the midfoot and forefoot is to proceed with a transtibial amputation.  Patient states he understands wished to proceed at this time risks and benefits were discussed.  Plan for right transtibial amputation tomorrow Friday.

## 2018-01-21 NOTE — Progress Notes (Signed)
VASCULAR LAB PRELIMINARY  ARTERIAL  ABI completed:ABIs are non reliable secondary to calcified vessels.  Right TBI is abnormal  Left TBI is WNL.    RIGHT    LEFT    PRESSURE WAVEFORM  PRESSURE WAVEFORM  BRACHIAL restricted T BRACHIAL 136 T  DP 161 B DP 174 B  AT   AT    PT 218 M PT 201 B  PER   PER    GREAT TOE 0.37 NA GREAT TOE 0.88 NA    RIGHT LEFT  ABI 1.6 1.48     Rayshaun Needle, RVT 01/21/2018, 1:27 PM

## 2018-01-21 NOTE — Progress Notes (Signed)
Pharmacy Antibiotic Note  Francisco Hutchinson is a 47 y.o. male on day # 4 Vancomycin and Zosyn for right foot cellulitis/dry gangrene.  Clindamycin stopped 6/12 as not nectrotizing fasciitis.  Planning transtibial amputation on 6/14.    Scr back to baseline. Zosyn and Vancomycin regimens still acceptable.    Tmax 99.4, WBC down to 21.7.  Plan:  Continue Vancomycin 1250 mg IV q24hrs  Continue Zosyn 3.375 gm IV q8hrs (each over 4 hours)  Follow renal function, culture data, and post-op antibiotic plans.  Height: 5\' 11"  (180.3 cm) Weight: 245 lb (111.1 kg) IBW/kg (Calculated) : 75.3  Temp (24hrs), Avg:98.3 F (36.8 C), Min:97.5 F (36.4 C), Max:99.4 F (37.4 C)  Recent Labs  Lab 01/18/18 1756 01/18/18 1821 01/19/18 0616 01/20/18 0831 01/21/18 0411  WBC 31.3*  --  26.8* 24.6* 21.7*  CREATININE 2.19*  --  1.98* 1.56* 1.50*  LATICACIDVEN  --  1.42  --   --   --     Estimated Creatinine Clearance: 79.6 mL/min (A) (by C-G formula based on SCr of 1.5 mg/dL (H)).    No Known Allergies  Antimicrobials this admission:  Vancomycin 6/10 >>  Zosyn 6/10 >>  Clindamycin 6/10 >> 6/12  Dose adjustments this admission:  n/a  Microbiology results: 6/10 Blood x 2 - no growth x 2 days to date 6/11 MRSA PCR- negative  Thank you for allowing pharmacy to be a part of this patient's care.  Arty Baumgartner, [place] Phone: [phone] 01/21/2018 12:01 PM

## 2018-01-22 ENCOUNTER — Inpatient Hospital Stay (HOSPITAL_COMMUNITY): Payer: PPO | Admitting: Anesthesiology

## 2018-01-22 ENCOUNTER — Encounter (HOSPITAL_COMMUNITY)
Admission: EM | Disposition: A | Discharge status: Skilled Nursing Facility | Payer: Self-pay | Source: Home / Self Care | Attending: Internal Medicine

## 2018-01-22 ENCOUNTER — Encounter (HOSPITAL_COMMUNITY): Payer: Self-pay | Admitting: Certified Registered"

## 2018-01-22 HISTORY — PX: AMPUTATION: SHX166

## 2018-01-22 LAB — BASIC METABOLIC PANEL
Anion gap: 4 — ABNORMAL LOW (ref 5–15)
BUN: 14 mg/dL (ref 6–20)
CHLORIDE: 108 mmol/L (ref 101–111)
CO2: 23 mmol/L (ref 22–32)
Calcium: 7.4 mg/dL — ABNORMAL LOW (ref 8.9–10.3)
Creatinine, Ser: 1.41 mg/dL — ABNORMAL HIGH (ref 0.61–1.24)
GFR calc Af Amer: >60 mL/min (ref >60)
GFR, EST NON AFRICAN AMERICAN: 59 mL/min — AB (ref >60)
GLUCOSE: 171 mg/dL — AB (ref 65–99)
POTASSIUM: 3.6 mmol/L (ref 3.5–5.1)
Sodium: 135 mmol/L (ref 135–145)

## 2018-01-22 LAB — CBC
HCT: 26.8 % — ABNORMAL LOW (ref 39.0–52.0)
Hemoglobin: 9 g/dL — ABNORMAL LOW (ref 13.0–17.0)
MCH: 30.4 pg (ref 26.0–34.0)
MCHC: 33.6 g/dL (ref 30.0–36.0)
MCV: 90.5 fL (ref 78.0–100.0)
Platelets: 355 10*3/uL (ref 150–400)
RBC: 2.96 MIL/uL — AB (ref 4.22–5.81)
RDW: 14.6 % (ref 11.5–15.5)
WBC: 21.2 10*3/uL — ABNORMAL HIGH (ref 4.0–10.5)

## 2018-01-22 LAB — GLUCOSE, CAPILLARY
Glucose-Capillary: 152 mg/dL — ABNORMAL HIGH (ref 65–99)
Glucose-Capillary: 117 mg/dL — ABNORMAL HIGH (ref 65–99)
Glucose-Capillary: 133 mg/dL — ABNORMAL HIGH (ref 65–99)
Glucose-Capillary: 279 mg/dL — ABNORMAL HIGH (ref 65–99)

## 2018-01-22 SURGERY — AMPUTATION BELOW KNEE
Anesthesia: General | Laterality: Right

## 2018-01-22 MED ORDER — FENTANYL CITRATE (PF) 250 MCG/5ML IJ SOLN
INTRAMUSCULAR | Status: AC
  Filled 2018-01-22: qty 5

## 2018-01-22 MED ORDER — MUPIROCIN 2 % EX OINT
1.0000 "application " | TOPICAL_OINTMENT | Freq: Two times a day (BID) | CUTANEOUS | Status: AC
  Administered 2018-01-22 – 2018-01-24 (×5): 1 via NASAL
  Filled 2018-01-22 (×2): qty 22

## 2018-01-22 MED ORDER — FENTANYL CITRATE (PF) 100 MCG/2ML IJ SOLN
INTRAMUSCULAR | Status: DC | PRN
  Administered 2018-01-22 (×2): 100 ug via INTRAVENOUS

## 2018-01-22 MED ORDER — LACTATED RINGERS IV SOLN
INTRAVENOUS | Status: DC
  Administered 2018-01-22: 12:00:00 via INTRAVENOUS

## 2018-01-22 MED ORDER — ONDANSETRON HCL 4 MG PO TABS: 4.0000 mg | ORAL_TABLET | Freq: Four times a day (QID) | ORAL | Status: DC | PRN

## 2018-01-22 MED ORDER — SODIUM CHLORIDE 0.9 % IV SOLN: INTRAVENOUS | Status: DC

## 2018-01-22 MED ORDER — PROPOFOL 10 MG/ML IV BOLUS
INTRAVENOUS | Status: AC
  Filled 2018-01-22: qty 20

## 2018-01-22 MED ORDER — JUVEN PO PACK
1.0000 | PACK | Freq: Two times a day (BID) | ORAL | Status: DC
  Administered 2018-01-23 – 2018-01-25 (×4): 1 via ORAL
  Filled 2018-01-22 (×6): qty 1

## 2018-01-22 MED ORDER — METHOCARBAMOL 1000 MG/10ML IJ SOLN
500.0000 mg | Freq: Four times a day (QID) | INTRAMUSCULAR | Status: DC | PRN
  Filled 2018-01-22: qty 5

## 2018-01-22 MED ORDER — PHENYLEPHRINE 40 MCG/ML (10ML) SYRINGE FOR IV PUSH (FOR BLOOD PRESSURE SUPPORT)
PREFILLED_SYRINGE | INTRAVENOUS | Status: DC | PRN
  Administered 2018-01-22: 80 ug via INTRAVENOUS
  Administered 2018-01-22: 120 ug via INTRAVENOUS
  Administered 2018-01-22: 80 ug via INTRAVENOUS

## 2018-01-22 MED ORDER — LIDOCAINE 2% (20 MG/ML) 5 ML SYRINGE
INTRAMUSCULAR | Status: AC
  Filled 2018-01-22: qty 5

## 2018-01-22 MED ORDER — METHOCARBAMOL 500 MG PO TABS: 500.0000 mg | ORAL_TABLET | Freq: Four times a day (QID) | ORAL | Status: DC | PRN

## 2018-01-22 MED ORDER — OXYCODONE HCL 5 MG PO TABS: 10.0000 mg | ORAL_TABLET | ORAL | Status: DC | PRN

## 2018-01-22 MED ORDER — BISACODYL 10 MG RE SUPP: 10.0000 mg | Freq: Every day | RECTAL | Status: DC | PRN

## 2018-01-22 MED ORDER — ALBUMIN HUMAN 5 % IV SOLN
INTRAVENOUS | Status: DC | PRN
  Administered 2018-01-22: 13:00:00 via INTRAVENOUS

## 2018-01-22 MED ORDER — POLYETHYLENE GLYCOL 3350 17 G PO PACK: 17.0000 g | PACK | Freq: Every day | ORAL | Status: DC | PRN

## 2018-01-22 MED ORDER — LACTATED RINGERS IV SOLN
INTRAVENOUS | Status: DC | PRN
Start: 2018-01-22 — End: 2018-01-22
  Administered 2018-01-22: 12:00:00 via INTRAVENOUS

## 2018-01-22 MED ORDER — METOCLOPRAMIDE HCL 5 MG PO TABS: 5.0000 mg | ORAL_TABLET | Freq: Three times a day (TID) | ORAL | Status: DC | PRN

## 2018-01-22 MED ORDER — PROMETHAZINE HCL 25 MG/ML IJ SOLN: 6.2500 mg | INTRAMUSCULAR | Status: DC | PRN

## 2018-01-22 MED ORDER — LIDOCAINE 2% (20 MG/ML) 5 ML SYRINGE
INTRAMUSCULAR | Status: DC | PRN
  Administered 2018-01-22: 100 mg via INTRAVENOUS

## 2018-01-22 MED ORDER — CEFAZOLIN SODIUM-DEXTROSE 2-4 GM/100ML-% IV SOLN
2.0000 g | INTRAVENOUS | Status: AC
  Administered 2018-01-22: 2 g via INTRAVENOUS
  Filled 2018-01-22: qty 100

## 2018-01-22 MED ORDER — DEXAMETHASONE SODIUM PHOSPHATE 4 MG/ML IJ SOLN
INTRAMUSCULAR | Status: DC | PRN
  Administered 2018-01-22: 4 mg via INTRAVENOUS

## 2018-01-22 MED ORDER — ONDANSETRON HCL 4 MG/2ML IJ SOLN: 4.0000 mg | Freq: Four times a day (QID) | INTRAMUSCULAR | Status: DC | PRN

## 2018-01-22 MED ORDER — DEXAMETHASONE SODIUM PHOSPHATE 10 MG/ML IJ SOLN
INTRAMUSCULAR | Status: AC
  Filled 2018-01-22: qty 1

## 2018-01-22 MED ORDER — MIDAZOLAM HCL 2 MG/2ML IJ SOLN
INTRAMUSCULAR | Status: AC
  Filled 2018-01-22: qty 2

## 2018-01-22 MED ORDER — OXYCODONE HCL 5 MG PO TABS: 5.0000 mg | ORAL_TABLET | ORAL | Status: DC | PRN

## 2018-01-22 MED ORDER — MIDAZOLAM HCL 5 MG/5ML IJ SOLN
INTRAMUSCULAR | Status: DC | PRN
  Administered 2018-01-22: 1 mg via INTRAVENOUS

## 2018-01-22 MED ORDER — ONDANSETRON HCL 4 MG/2ML IJ SOLN
INTRAMUSCULAR | Status: AC
  Filled 2018-01-22: qty 2

## 2018-01-22 MED ORDER — CEFAZOLIN SODIUM 1 G IJ SOLR
INTRAMUSCULAR | Status: AC
  Filled 2018-01-22: qty 20

## 2018-01-22 MED ORDER — ONDANSETRON HCL 4 MG/2ML IJ SOLN
INTRAMUSCULAR | Status: DC | PRN
  Administered 2018-01-22: 4 mg via INTRAVENOUS

## 2018-01-22 MED ORDER — METOCLOPRAMIDE HCL 5 MG/ML IJ SOLN: 5.0000 mg | Freq: Three times a day (TID) | INTRAMUSCULAR | Status: DC | PRN

## 2018-01-22 MED ORDER — PHENYLEPHRINE 40 MCG/ML (10ML) SYRINGE FOR IV PUSH (FOR BLOOD PRESSURE SUPPORT)
PREFILLED_SYRINGE | INTRAVENOUS | Status: AC
  Filled 2018-01-22: qty 10

## 2018-01-22 MED ORDER — DIPHENHYDRAMINE HCL 50 MG/ML IJ SOLN
INTRAMUSCULAR | Status: DC | PRN
  Administered 2018-01-22: 12.5 mg via INTRAVENOUS

## 2018-01-22 MED ORDER — DOCUSATE SODIUM 100 MG PO CAPS
100.0000 mg | ORAL_CAPSULE | Freq: Two times a day (BID) | ORAL | Status: DC
  Administered 2018-01-22 – 2018-01-25 (×3): 100 mg via ORAL
  Filled 2018-01-22 (×5): qty 1

## 2018-01-22 MED ORDER — HYDROMORPHONE HCL 2 MG/ML IJ SOLN: 0.2500 mg | INTRAMUSCULAR | Status: DC | PRN

## 2018-01-22 MED ORDER — 0.9 % SODIUM CHLORIDE (POUR BTL) OPTIME
TOPICAL | Status: DC | PRN
  Administered 2018-01-22: 1000 mL

## 2018-01-22 MED ORDER — DIPHENHYDRAMINE HCL 50 MG/ML IJ SOLN
INTRAMUSCULAR | Status: AC
  Filled 2018-01-22: qty 1

## 2018-01-22 MED ORDER — ACETAMINOPHEN 325 MG PO TABS: 325.0000 mg | ORAL_TABLET | Freq: Four times a day (QID) | ORAL | Status: DC | PRN

## 2018-01-22 MED ORDER — SUCCINYLCHOLINE CHLORIDE 200 MG/10ML IV SOSY
PREFILLED_SYRINGE | INTRAVENOUS | Status: DC | PRN
  Administered 2018-01-22: 120 mg via INTRAVENOUS

## 2018-01-22 MED ORDER — MAGNESIUM CITRATE PO SOLN: 1.0000 | Freq: Once | ORAL | Status: DC | PRN

## 2018-01-22 MED ORDER — PROPOFOL 10 MG/ML IV BOLUS
INTRAVENOUS | Status: DC | PRN
  Administered 2018-01-22: 150 mg via INTRAVENOUS

## 2018-01-22 MED ORDER — HYDROMORPHONE HCL 1 MG/ML IJ SOLN: 0.5000 mg | INTRAMUSCULAR | Status: DC | PRN

## 2018-01-22 SURGICAL SUPPLY — 39 items
BENZOIN TINCTURE PRP APPL 2/3 (GAUZE/BANDAGES/DRESSINGS) ×12 IMPLANT
BLADE SAW RECIP 87.9 MT (BLADE) ×3 IMPLANT
BLADE SURG 21 STRL SS (BLADE) ×3 IMPLANT
BNDG COHESIVE 6X5 TAN STRL LF (GAUZE/BANDAGES/DRESSINGS) ×6 IMPLANT
BNDG GAUZE ELAST 4 BULKY (GAUZE/BANDAGES/DRESSINGS) ×6 IMPLANT
COVER SURGICAL LIGHT HANDLE (MISCELLANEOUS) ×3 IMPLANT
CUFF TOURNIQUET SINGLE 34IN LL (TOURNIQUET CUFF) ×2 IMPLANT
CUFF TOURNIQUET SINGLE 44IN (TOURNIQUET CUFF) IMPLANT
DRAPE INCISE IOBAN 66X45 STRL (DRAPES) IMPLANT
DRAPE U-SHAPE 47X51 STRL (DRAPES) ×3 IMPLANT
DRESSING PREVENA PLUS CUSTOM (GAUZE/BANDAGES/DRESSINGS) ×1 IMPLANT
DRSG PREVENA PLUS CUSTOM (GAUZE/BANDAGES/DRESSINGS) ×3
DURAPREP 26ML APPLICATOR (WOUND CARE) ×3 IMPLANT
ELECT REM PT RETURN 9FT ADLT (ELECTROSURGICAL) ×3
ELECTRODE REM PT RTRN 9FT ADLT (ELECTROSURGICAL) ×1 IMPLANT
GLOVE BIOGEL PI IND STRL 9 (GLOVE) ×1 IMPLANT
GLOVE BIOGEL PI INDICATOR 9 (GLOVE) ×2
GLOVE SURG ORTHO 9.0 STRL STRW (GLOVE) ×3 IMPLANT
GOWN STRL REUS W/ TWL XL LVL3 (GOWN DISPOSABLE) ×2 IMPLANT
GOWN STRL REUS W/TWL XL LVL3 (GOWN DISPOSABLE) ×4
KIT BASIN OR (CUSTOM PROCEDURE TRAY) ×3 IMPLANT
KIT DRSG PREVENA PLUS 7DAY 125 (MISCELLANEOUS) ×2 IMPLANT
KIT TURNOVER KIT B (KITS) ×3 IMPLANT
MANIFOLD NEPTUNE II (INSTRUMENTS) ×3 IMPLANT
NS IRRIG 1000ML POUR BTL (IV SOLUTION) ×3 IMPLANT
PACK ORTHO EXTREMITY (CUSTOM PROCEDURE TRAY) ×3 IMPLANT
PAD ARMBOARD 7.5X6 YLW CONV (MISCELLANEOUS) ×3 IMPLANT
SPONGE LAP 18X18 X RAY DECT (DISPOSABLE) IMPLANT
STAPLER VISISTAT 35W (STAPLE) IMPLANT
STOCKINETTE IMPERVIOUS LG (DRAPES) ×3 IMPLANT
SUT ETHILON 2 0 PSLX (SUTURE) ×2 IMPLANT
SUT SILK 2 0 (SUTURE) ×2
SUT SILK 2-0 18XBRD TIE 12 (SUTURE) ×1 IMPLANT
SUT VIC AB 1 CTX 27 (SUTURE) IMPLANT
TOWEL OR 17X26 10 PK STRL BLUE (TOWEL DISPOSABLE) ×3 IMPLANT
TUBE CONNECTING 12'X1/4 (SUCTIONS) ×1
TUBE CONNECTING 12X1/4 (SUCTIONS) ×2 IMPLANT
WND VAC CANISTER 500ML (MISCELLANEOUS) ×2 IMPLANT
YANKAUER SUCT BULB TIP NO VENT (SUCTIONS) ×3 IMPLANT

## 2018-01-22 NOTE — Progress Notes (Signed)
Consult per nurse.  Visited with Mr. Holzheimer anticipating amputation of lower leg surgery today.  Patient was pretty quiet and not interested in talking but was open to receiving prayer. Chaplain  prayed for his peace of mind and for healing in his body and for all workers helping him and for his family during this time.  Provided pastoral support and shared chaplains are available 24/7 if he would like a chaplain to come just request through his nurse.   01/22/18 1000  Clinical Encounter Type  Visited With Patient  Visit Type Initial;Spiritual support;Other (Comment) (loss-preparing for amputation surgery)  Referral From Nurse  Consult/Referral To Chaplain  Spiritual Encounters  Spiritual Needs Prayer  Stress Factors  Patient Stress Factors Health changes;Loss  Family Stress Factors None identified   Conard Novak, Chaplain

## 2018-01-22 NOTE — Transfer of Care (Signed)
Immediate Anesthesia Transfer of Care Note  Patient: Francisco Hutchinson  Procedure(s) Performed: RIGHT BELOW KNEE AMPUTATION (Right )  Patient Location: PACU  Anesthesia Type:General  Level of Consciousness: awake, oriented and patient cooperative  Airway & Oxygen Therapy: Patient Spontanous Breathing and Patient connected to face mask oxygen  Post-op Assessment: Report given to RN and Post -op Vital signs reviewed and stable  Post vital signs: Reviewed and stable  Last Vitals:  Vitals Value Taken Time  BP 115/83 01/22/2018  1:17 PM  Temp    Pulse 84 01/22/2018  1:18 PM  Resp 20 01/22/2018  1:18 PM  SpO2 100 % 01/22/2018  1:18 PM  Vitals shown include unvalidated device data.  Last Pain:  Vitals:   01/22/18 1010  TempSrc: Oral  PainSc:          Complications: No apparent anesthesia complications

## 2018-01-22 NOTE — Op Note (Signed)
   Date of Surgery: 01/22/2018  INDICATIONS: Francisco Hutchinson is a 47 y.o.-year-old male who has peripheral vascular disease without revascularization options with gangrene of the right foot.  PREOPERATIVE DIAGNOSIS: Gangrene right foot  POSTOPERATIVE DIAGNOSIS: Same.  PROCEDURE: Transtibial amputation Application of Prevena wound VAC  SURGEON: Sharol Given, M.D.  ANESTHESIA:  general  IV FLUIDS AND URINE: See anesthesia.  ESTIMATED BLOOD LOSS: 100 mL.  COMPLICATIONS: None.  DESCRIPTION OF PROCEDURE: The patient was brought to the operating room and underwent a general anesthetic. After adequate levels of anesthesia were obtained patient's lower extremity was prepped using DuraPrep draped into a sterile field. A timeout was called. The foot was draped out of the sterile field with impervious stockinette. A transverse incision was made 11 cm distal to the tibial tubercle. This curved proximally and a large posterior flap was created. The tibia was transected 1 cm proximal to the skin incision. The fibula was transected just proximal to the tibial incision. The tibia was beveled anteriorly. A large posterior flap was created. The sciatic nerve was pulled cut and allowed to retract. The vascular bundles were suture ligated with 2-0 silk. The deep and superficial fascial layers were closed using #1 Vicryl. The skin was closed using staples and 2-0 nylon. The wound was covered with a Prevena wound VAC. There was a good suction fit. A prosthetic shrinker was applied. Patient was extubated taken to the PACU in stable condition.  Findings: Muscle was contractile but had extreme amount of fibrinous tissue   DISCHARGE PLANNING:  Antibiotic duration: 24 hours postoperatively  Weightbearing: Nonweightbearing on the right  Pain medication: High-dose opiate pathway ordered  Dressing care/ Wound VAC: Continue wound VAC at discharge with attachment to the Praveena plus portable pump.  Discharge to: Skilled  nursing facility.  Follow-up: In the office 1 week post operative.  Meridee Score, MD [facility] 1:17 PM

## 2018-01-22 NOTE — Anesthesia Preprocedure Evaluation (Addendum)
Anesthesia Evaluation  Patient identified by MRN, date of birth, ID band Patient awake    Reviewed: Allergy & Precautions, NPO status , Patient's Chart, lab work & pertinent test results  History of Anesthesia Complications Negative for: history of anesthetic complications  Airway Mallampati: II       Dental   Pulmonary shortness of breath,    breath sounds clear to auscultation       Cardiovascular hypertension,  Rhythm:Regular Rate:Normal     Neuro/Psych PSYCHIATRIC DISORDERS Anxiety Depression CVA    GI/Hepatic   Endo/Other  diabetes, Poorly Controlled  Renal/GU Renal InsufficiencyRenal disease     Musculoskeletal   Abdominal   Peds  Hematology  (+) HIV, HIV   Anesthesia Other Findings   Reproductive/Obstetrics                            Anesthesia Physical  Anesthesia Plan  ASA: III  Anesthesia Plan: General   Post-op Pain Management:    Induction: Intravenous, Rapid sequence and Cricoid pressure planned  PONV Risk Score and Plan: 3 and Ondansetron, Dexamethasone and Diphenhydramine  Airway Management Planned: Oral ETT  Additional Equipment:   Intra-op Plan:   Post-operative Plan: Extubation in OR  Informed Consent: I have reviewed the patients History and Physical, chart, labs and discussed the procedure including the risks, benefits and alternatives for the proposed anesthesia with the patient or authorized representative who has indicated his/her understanding and acceptance.   Dental advisory given  Plan Discussed with: CRNA, Anesthesiologist and Surgeon  Anesthesia Plan Comments:        Anesthesia Quick Evaluation

## 2018-01-22 NOTE — Anesthesia Postprocedure Evaluation (Signed)
Anesthesia Post Note  Patient: Francisco Hutchinson  Procedure(s) Performed: RIGHT BELOW KNEE AMPUTATION (Right )     Patient location during evaluation: PACU Anesthesia Type: General Level of consciousness: sedated Pain management: pain level controlled Vital Signs Assessment: post-procedure vital signs reviewed and stable Respiratory status: spontaneous breathing and respiratory function stable Cardiovascular status: stable Postop Assessment: no apparent nausea or vomiting Anesthetic complications: no    Last Vitals:  Vitals:   01/22/18 1345 01/22/18 1358  BP: 131/83   Pulse: 81   Resp: (!) 22   Temp:  (!) 36.4 C  SpO2: 95%     Last Pain:  Vitals:   01/22/18 1317  TempSrc:   PainSc: 0-No pain                 Sieara Bremer DANIEL

## 2018-01-22 NOTE — Anesthesia Procedure Notes (Signed)
Procedure Name: Intubation Date/Time: 01/22/2018 12:22 PM Performed by: Orlie Dakin, CRNA Pre-anesthesia Checklist: Patient identified, Emergency Drugs available, Suction available, Patient being monitored and Timeout performed Patient Re-evaluated:Patient Re-evaluated prior to induction Oxygen Delivery Method: Circle system utilized Preoxygenation: Pre-oxygenation with 100% oxygen Induction Type: Cricoid Pressure applied, Rapid sequence and IV induction Laryngoscope Size: Miller and 3 Grade View: Grade I Tube type: Oral Tube size: 7.0 mm Number of attempts: 1 Airway Equipment and Method: Stylet Placement Confirmation: ETT inserted through vocal cords under direct vision,  positive ETCO2 and breath sounds checked- equal and bilateral Secured at: 23 cm Tube secured with: Tape Dental Injury: Teeth and Oropharynx as per pre-operative assessment  Comments: RSI due to C/O nausea in Short-Stay, no vomiting.  4x4s bite block used.

## 2018-01-22 NOTE — Progress Notes (Signed)
Initial Nutrition Assessment  DOCUMENTATION CODES:   Obesity unspecified  INTERVENTION:   -1 packet Juven BID, each packet provides 80 calories, 8 grams of carbohydrate, and 14 grams of amino acids; supplement contains CaHMB, glutamine, and arginine, to promote wound healing  NUTRITION DIAGNOSIS:   Increased nutrient needs related to wound healing, post-op healing as evidenced by estimated needs.  GOAL:   Patient will meet greater than or equal to 90% of their needs  MONITOR:   PO intake, Supplement acceptance, Labs, Weight trends, Skin, I & O's  REASON FOR ASSESSMENT:   Consult Assessment of nutrition requirement/status  ASSESSMENT:   Koran Seabrook is a 47 y.o. male with history of diabetes mellitus type 2, HIV, hypertension presents to the ER because of worsening wound on the right foot which has been progressing over the last 4 to 5 days.   Pt admitted with rt foot necrotizing fascitis.   6/11- hypoglycemic event 6/12- s/p US-guided cannulation of lt femoral artery and CO2 aortogram with rt lower extremity angiogram (revealed no significant infrainguinal arterial occlusive disease and three-vessel runoff to foot with small vessel disease; will likely need transtibial amputation 6/14- s/p rt BKA with vac placement  Pt sleepy at time of visit. Most of hx obtain from pt partner at bedside (Mr. [name]). He reports pt generally has a very good appetite, however, has nausea 5 days PTA. Noted meal completion 50-100%. Partner has also been bringing pt outside food, such as a small hamburger and tater tots from [employer].   Pt partner denies that pt has any weight loss. Reviewed wt hx; noted wt increase over the past 3 months.   Last Hgb A1c: 11.3 (01/05/18). PTA DM medications 55 units lantus solostar daily and 1.5 mg trulicity weekly. Discussed home DM control with pt partner. He reports pt checks CBGS twice daily (bbllod sugars were running in the 200's in the AM and between the  300-400's in the evening, however, have improved significantly since increasing lantus dose from 50 to 55 units daily- 90-120 every morning and below 200 at night). Per partner, pt has always struggled with poor DM control due to love of high carbohydrate foods, such as sweets, potatoes, rice, and corn. He has been trying to work on decreasing portion sizes at home, but expresses concern with pt being on carb modified diet. Discussed how small changes, such as decreasing portion sizes and adding fresh fruits and vegetables could benefit pt, however, he still expresses concern that carb modified diet "will not fly at home". Stressed importance of limiting foods with high simple sugars, particularly in the hospital; educated pt partner on importance of optimizing glycemic control, especially in presence of surgical healing and infection.   Partner shared with this RD that pt has had many struggles and transitions in the past several years, including previous necrotizing fascitis, rt arm deficits from CVA (with prominent fat and muscle wasting), and now BKA; pt is very accomplished and was a former Biomedical scientist, pianist, and enjoyed clogging. Offered emotional support.   Labs reviewed: CBGS: 117-152(inpatient orders for glycemic control are 0-9 units insulin aspart TID with meals and 15 units insulin glargine q HS).   NUTRITION - FOCUSED PHYSICAL EXAM:    Most Recent Value  Orbital Region  No depletion  Upper Arm Region  Moderate depletion [lt side WDL,  rt side with depletions related to deficits from CVA and prior sx from NF]  Thoracic and Lumbar Region  No depletion  Buccal Region  No depletion  Temple Region  No depletion  Clavicle Bone Region  No depletion  Clavicle and Acromion Bone Region  No depletion  Scapular Bone Region  No depletion  Dorsal Hand  Moderate depletion [lt side WDL,  rt side with depletions related to deficits from CVA and prior sx from NF]  Patellar Region  Mild depletion  Anterior  Thigh Region  Mild depletion  Posterior Calf Region  Mild depletion  Edema (RD Assessment)  None  Hair  Reviewed  Eyes  Reviewed  Mouth  Reviewed  Skin  Reviewed  Nails  Reviewed      Diet Order:   Diet Order           Diet Carb Modified Fluid consistency: Thin; Room service appropriate? Yes  Diet effective now          EDUCATION NEEDS:   Education needs have been addressed  Skin:  Skin Assessment: Skin Integrity Issues: Skin Integrity Issues:: Wound VAC Wound Vac: rt BKA site  Last BM:  01/21/18  Height:   Ht Readings from Last 1 Encounters:  01/22/18 5\' 11"  (1.803 m)    Weight:   Wt Readings from Last 1 Encounters:  01/22/18 245 lb (111.1 kg)    Ideal Body Weight:  73.1 kg  BMI:  Body mass index is 34.17 kg/m.  Estimated Nutritional Needs:   Kcal:  2200-2400  Protein:  115-130 grams  Fluid:  > 2.2 L    Hisako Bugh A. Jimmye Norman, RD, LDN, CDE Pager: [phone] After hours Pager: [phone]

## 2018-01-22 NOTE — Care Management Note (Signed)
Case Management Note Marvetta Gibbons RN, BSN Unit 4E-Case Manager [phone]  Patient Details  Name: Kawon Willcutt MRN: [id number] Date of Birth: [birth year]  Subjective/Objective:     Pt admitted with necrotizing fascitis/gangrene- s/p BKA on 01/22/18               Action/Plan: PTA Pt lived at home, partner for support person, PT/OT ordered post BKA- pt will most likely need rehab (CIR vs SNF) prior to transition home. - CM to follow for recommendations.   Expected Discharge Date:                  Expected Discharge Plan:     In-House Referral:  Clinical Social Work  Discharge planning Services  CM Consult  Post Acute Care Choice:    Choice offered to:     DME Arranged:    DME Agency:     HH Arranged:    HH Agency:     Status of Service:  In process, will continue to follow  If discussed at Long Length of Stay Meetings, dates discussed:    Discharge Disposition:   Additional Comments:  Dawayne Patricia, RN 01/22/2018, 2:35 PM

## 2018-01-22 NOTE — Interval H&P Note (Signed)
History and Physical Interval Note:  01/22/2018 6:44 AM  Francisco Hutchinson  has presented today for surgery, with the diagnosis of Gangrene Right Foot  The various methods of treatment have been discussed with the patient and family. After consideration of risks, benefits and other options for treatment, the patient has consented to  Procedure(s): RIGHT BELOW KNEE AMPUTATION (Right) as a surgical intervention .  The patient's history has been reviewed, patient examined, no change in status, stable for surgery.  I have reviewed the patient's chart and labs.  Questions were answered to the patient's satisfaction.     [name]

## 2018-01-22 NOTE — Progress Notes (Signed)
PROGRESS NOTE   Francisco Hutchinson  UYQ:[id number]    DOB: 1970/12/17    DOA: 01/18/2018  PCP: [name], MD   I have briefly reviewed patients previous medical records in [facility].  Brief Narrative:  47 year old male with PMH of DM 2, HIV, HTN, CVA, anxiety and depression, presented to ED 6/10 due to worsening right foot wound, progressive darkening of toes.  Admitted for gangrene of right foot.  Orthopedics and vascular surgery consulted.  Underwent angiogram by vascular surgery 6/12 without significant blockage.  Orthopedics planning right transtibial amputation on 6/14..   Assessment & Plan:   Principal Problem:   Necrotizing fasciitis ([place]) Active Problems:   CKD (chronic kidney disease), stage III ([facility])   Type 2 diabetes mellitus with diabetic chronic kidney disease ([place])   HIV disease ([place])   Hypertension associated with diabetes ([place])   Gangrene of right foot ([place])   Severe protein-calorie malnutrition ([place])   Gangrene of right forefoot - Orthopedic/[name] was consulted and recommended PAD evaluation by vascular surgery prior to transtibial amputation. - Vascular surgery consulted and patient underwent arteriogram 6/12 without significant blockage. -[name] plans right transtibial amputation 6/14.  As per [name], patient has severe microcirculatory disease with insufficient microcirculation to the midfoot and forefoot. -Continue IV vancomycin and Zosyn.  Clindamycin discontinued. -Orthopedics to advise duration of postop antibiotics.  Blood cultures x2: Negative to date.  Type II DM with peripheral neuropathy and renal complications Had hypoglycemic episode 6/11.  Continue reduced dose of Lantus 15 units at bedtime and SSI.  CBGs mildly uncontrolled and fluctuating.  Currently n.p.o. for surgery this morning.  Postop may need adjustment of his insulins based on CBGs.  Essential hypertension:  Controlled.   Holding HCTZ due to acute kidney injury.   Continue metoprolol.  Holding amlodipine as well.  Blood pressure starting to mildly increase and may consider resuming amlodipine.  Reassess in a.m.  Acute on stage III chronic kidney disease:  Presented with creatinine of 2.19 which is improved to 1.56 after hydration.  HCTZ on hold.  Continue IV fluids and follow BMP in a.m.  Baseline creatinine likely 1.6.  Creatinine has dropped down to 1.4 on 6/14.  Follow BMP in a.m.  HIV Continue ART.  Outpatient follow-up with ID.  Cannot recall last CD4 count.  Anemia, normocytic Some of this may be dilutional complicating anemia of chronic disease and chronic kidney disease.  Hemoglobin stable in the 9 g range for the last 3 days.  Leukocytosis Secondary to right foot dry gangrene/infectious etiology.  Continue current antibiotics as above and follow postop CBC in a.m.  Hypokalemia:  Replaced.  Magnesium 2.  Hyperlipidemia Continue atorvastatin, fenofibrate.  Depression:  Continue sertraline.  Severe protein calorie malnutrition RD consulted.  DVT prophylaxis: SCDs Code Status: Full Family Communication: None at bedside this morning. Disposition: To be determined pending clinical improvement   Consultants:  Orthopedics/[name] Vascular surgery  Procedures:  Arteriogram 6/2  Antimicrobials:  IV vancomycin and Zosyn.  Discontinued clindamycin.   Subjective: Patient seen this morning prior to surgery.  Denied complaints.  ROS: As above  Objective:  Vitals:   01/21/18 2017 01/22/18 0559 01/22/18 1010 01/22/18 1139  BP: (!) 149/93 129/84 (!) 143/83   Pulse: 65 64 65   Resp: 20 20 18    Temp: 99.1 F (37.3 C) 98.3 F (36.8 C) (!) 97.4 F (36.3 C)   TempSrc: Oral Oral Oral   SpO2: 99% 98% 97%  Weight:    111.1 kg (245 lb)  Height:    5\' 11"  (1.803 m)    Examination: No significant change in clinical exam compared to yesterday.  General exam: Pleasant young male, moderately built and nourished lying comfortably  propped up in bed. Respiratory system: Clear to auscultation. Respiratory effort normal.  Stable Cardiovascular system: S1 & S2 heard, RRR. No JVD, murmurs, rubs, gallops or clicks. No pedal edema.  Telemetry personally reviewed: Sinus rhythm. Gastrointestinal system: Abdomen is nondistended, soft and nontender. No organomegaly or masses felt. Normal bowel sounds heard.  Stable Central nervous system: Alert and oriented. No focal neurological deficits.  Stable Extremities: Symmetric 5 x 5 power.  Right foot exam findings as per pictures below taken 01/18/2018.  No active drainage.  No change in exam since yesterday.  Right upper extremity scar near the axilla from necrotizing fasciitis.  Right upper extremity with decreased bulk. Psychiatry: Judgement and insight appear normal. Mood & affect appropriate.         Data Reviewed: I have personally reviewed following labs and imaging studies  CBC: Recent Labs  Lab 01/18/18 1756 01/19/18 0616 01/20/18 0831 01/21/18 0411 01/22/18 0345  WBC 31.3* 26.8* 24.6* 21.7* 21.2*  NEUTROABS 29.8* 24.4*  --   --   --   HGB 11.4* 9.8* 9.3* 9.3* 9.0*  HCT 33.3* 28.3* 27.5* 27.9* 26.8*  MCV 89.3 89.3 89.6 90.9 90.5  PLT 439* 353 357 346 297   Basic Metabolic Panel: Recent Labs  Lab 01/18/18 1756 01/19/18 0616 01/20/18 0813 01/20/18 0831 01/21/18 0411 01/22/18 0345  NA 133* 133*  --  138 137 135  K 3.2* 2.8*  --  3.3* 3.4* 3.6  CL 91* 98*  --  105 108 108  CO2 28 26  --  24 23 23   GLUCOSE 107* 76  --  93 170* 171*  BUN 33* 32*  --  22* 20 14  CREATININE 2.19* 1.98*  --  1.56* 1.50* 1.41*  CALCIUM 8.5* 7.7*  --  7.8* 7.3* 7.4*  MG  --   --  2.0  --   --   --    Liver Function Tests: Recent Labs  Lab 01/18/18 1756 01/19/18 0616  AST 33 28  ALT 18 17  ALKPHOS 122 111  BILITOT 0.9 0.8  PROT 8.4* 6.4*  ALBUMIN 2.2* 1.7*   CBG: Recent Labs  Lab 01/21/18 0626 01/21/18 1119 01/21/18 1615 01/21/18 2137 01/22/18 0605  GLUCAP 151*  240* 287* 193* 152*    Recent Results (from the past 240 hour(s))  Blood Culture (routine x 2)     Status: None (Preliminary result)   Collection Time: 01/18/18  7:23 PM  Result Value Ref Range Status   Specimen Description BLOOD LEFT ANTECUBITAL  Final   Special Requests   Final    BOTTLES DRAWN AEROBIC AND ANAEROBIC Blood Culture adequate volume   Culture   Final    NO GROWTH 3 DAYS Performed at [facility], [address] [place], [place] [address]    Report Status PENDING  Incomplete  Blood Culture (routine x 2)     Status: None (Preliminary result)   Collection Time: 01/18/18  7:23 PM  Result Value Ref Range Status   Specimen Description BLOOD LEFT HAND  Final   Special Requests   Final    BOTTLES DRAWN AEROBIC AND ANAEROBIC Blood Culture adequate volume   Culture   Final    NO GROWTH 3 DAYS  Performed at [facility], [place] [address], [place], [place] [address]    Report Status PENDING  Incomplete  MRSA PCR Screening     Status: None   Collection Time: 01/19/18  1:02 PM  Result Value Ref Range Status   MRSA by PCR NEGATIVE NEGATIVE Final    Comment:        The GeneXpert MRSA Assay (FDA approved for NASAL specimens only), is one component of a comprehensive MRSA colonization surveillance program. It is not intended to diagnose MRSA infection nor to guide or monitor treatment for MRSA infections. Performed at [facility], [place] [address], [place], [place] [address]          Radiology Studies: No results found.      Scheduled Meds: . [MAR Hold] atorvastatin  40 mg Oral Daily  . [MAR Hold] dolutegravir  50 mg Oral Daily  . [MAR Hold] emtricitabine-tenofovir AF  1 tablet Oral QHS  . [MAR Hold] fenofibrate  80 mg Oral QHS  . [MAR Hold] ferrous gluconate  324 mg Oral Q breakfast  . [MAR Hold] insulin aspart  0-9 Units Subcutaneous TID WC  . [MAR Hold] insulin glargine  15 Units Subcutaneous Q2200  . [MAR Hold] metoprolol tartrate  100 mg  Oral BID  . [MAR Hold] mupirocin ointment  1 application Nasal BID  . [MAR Hold] sertraline  100 mg Oral Daily  . [MAR Hold] sertraline  75 mg Oral QHS  . [MAR Hold] sodium chloride flush  3 mL Intravenous Q12H   Continuous Infusions: . [MAR Hold] sodium chloride 250 mL (01/21/18 1131)  .  ceFAZolin (ANCEF) IV    . lactated ringers 10 mL/hr at 01/22/18 1141  . [MAR Hold] piperacillin-tazobactam (ZOSYN)  IV 3.375 g (01/22/18 0341)  . [MAR Hold] vancomycin Stopped (01/22/18 0210)     LOS: 4 days     Vernell Leep, MD, FACP, [facility]. [facility] Pager [phone]  If 7PM-7AM, please contact night-coverage [email or web address] Password [facility] 01/22/2018, 12:21 PM

## 2018-01-23 ENCOUNTER — Encounter (HOSPITAL_COMMUNITY): Payer: Self-pay | Admitting: Orthopedic Surgery

## 2018-01-23 LAB — GLUCOSE, CAPILLARY
GLUCOSE-CAPILLARY: 297 mg/dL — AB (ref 65–99)
Glucose-Capillary: 277 mg/dL — ABNORMAL HIGH (ref 65–99)
Glucose-Capillary: 303 mg/dL — ABNORMAL HIGH (ref 65–99)
Glucose-Capillary: 337 mg/dL — ABNORMAL HIGH (ref 65–99)

## 2018-01-23 LAB — BASIC METABOLIC PANEL
ANION GAP: 7 (ref 5–15)
BUN: 16 mg/dL (ref 6–20)
CHLORIDE: 107 mmol/L (ref 101–111)
CO2: 24 mmol/L (ref 22–32)
Calcium: 7.5 mg/dL — ABNORMAL LOW (ref 8.9–10.3)
Creatinine, Ser: 1.37 mg/dL — ABNORMAL HIGH (ref 0.61–1.24)
GFR calc Af Amer: >60 mL/min (ref >60)
GLUCOSE: 269 mg/dL — AB (ref 65–99)
POTASSIUM: 3.5 mmol/L (ref 3.5–5.1)
Sodium: 138 mmol/L (ref 135–145)

## 2018-01-23 LAB — CULTURE, BLOOD (ROUTINE X 2)
Culture: NO GROWTH
Culture: NO GROWTH
SPECIAL REQUESTS: ADEQUATE
Special Requests: ADEQUATE

## 2018-01-23 LAB — CBC
HCT: 27.4 % — ABNORMAL LOW (ref 39.0–52.0)
Hemoglobin: 8.9 g/dL — ABNORMAL LOW (ref 13.0–17.0)
MCH: 30 pg (ref 26.0–34.0)
MCHC: 32.5 g/dL (ref 30.0–36.0)
MCV: 92.3 fL (ref 78.0–100.0)
Platelets: 366 10*3/uL (ref 150–400)
RBC: 2.97 MIL/uL — ABNORMAL LOW (ref 4.22–5.81)
RDW: 14.9 % (ref 11.5–15.5)
WBC: 14.6 10*3/uL — ABNORMAL HIGH (ref 4.0–10.5)

## 2018-01-23 NOTE — Evaluation (Signed)
Physical Therapy Evaluation Patient Details Name: Francisco Hutchinson MRN: [id number] DOB: 1971/07/18 Today's Date: 01/23/2018   History of Present Illness  47 year old male with PMH of DM 2, HIV, HTN, CVA, anxiety and depression, presented to ED 6/10 due to worsening right foot wound, progressive darkening of toes.  Admitted for gangrene of right foot.  Orthopedics and vascular surgery consulted.  Underwent angiogram by vascular surgery 6/12 without significant blockage.  Orthopedics planning right transtibial amputation on 6/14. s/p R BKA    Clinical Impression  Patient is s/p above surgery resulting in functional limitations due to the deficits listed below (see PT Problem List). PTA, patient living with partner and aunt, ambulating with RW. Upon eval pt presents with generalized weakness and deconditioning. Unable to stand today with RW or Stedy and max A from PT. Pt reports he feelings weaker than normal. RN notified need for hoyer lift at this time, will cont to follow and progress as tolerated. Patient will benefit from skilled PT to increase their independence and safety with mobility to allow discharge to the venue listed below.        Follow Up Recommendations SNF;Supervision/Assistance - 24 hour    Equipment Recommendations  (TBD next venue)    Recommendations for Other Services OT consult     Precautions / Restrictions Precautions Precautions: Fall Restrictions Weight Bearing Restrictions: Yes RLE Weight Bearing: Non weight bearing      Mobility  Bed Mobility Overal bed mobility: Needs Assistance Bed Mobility: Supine to Sit;Sit to Supine     Supine to sit: Min guard Sit to supine: Min guard   General bed mobility comments: increased time and effort  Transfers Overall transfer level: Needs assistance Equipment used: Rolling walker (2 wheeled) Transfers: Sit to/from Stand Sit to Stand: Max assist;+2 physical assistance         General transfer comment: Max  A to stand, patient attmepting to parcipiate but unable to with RW or Stedy this visit.   Ambulation/Gait             General Gait Details: unable at this time  Stairs            Wheelchair Mobility    Modified Rankin (Stroke Patients Only)       Balance                                             Pertinent Vitals/Pain Pain Assessment: No/denies pain    Home Living Family/patient expects to be discharged to:: Skilled nursing facility                      Prior Function Level of Independence: Needs assistance   Gait / Transfers Assistance Needed: Pt states he ambulated with RW but informed RN that he was largely wc bound  ADL's / Homemaking Assistance Needed: Aunt helps with adls        Hand Dominance        Extremity/Trunk Assessment   Upper Extremity Assessment Upper Extremity Assessment: Generalized weakness    Lower Extremity Assessment Lower Extremity Assessment: Generalized weakness       Communication   Communication: No difficulties  Cognition Arousal/Alertness: Awake/alert   Overall Cognitive Status: Within Functional Limits for tasks assessed  General Comments      Exercises Amputee Exercises Gluteal Sets: 10 reps Hip Extension: 10 reps Hip ABduction/ADduction: 10 reps   Assessment/Plan    PT Assessment Patient needs continued PT services  PT Problem List Decreased strength;Decreased range of motion;Decreased activity tolerance;Decreased mobility;Decreased balance       PT Treatment Interventions DME instruction;Gait training;Stair training;Functional mobility training;Therapeutic activities;Therapeutic exercise;Balance training    PT Goals (Current goals can be found in the Care Plan section)  Acute Rehab PT Goals Patient Stated Goal: walk and golf again PT Goal Formulation: With patient Time For Goal Achievement: 02/06/18 Potential to  Achieve Goals: Good    Frequency Min 2X/week   Barriers to discharge        Co-evaluation               AM-PAC PT "6 Clicks" Daily Activity  Outcome Measure Difficulty turning over in bed (including adjusting bedclothes, sheets and blankets)?: A Lot Difficulty moving from lying on back to sitting on the side of the bed? : A Lot Difficulty sitting down on and standing up from a chair with arms (e.g., wheelchair, bedside commode, etc,.)?: A Lot Help needed moving to and from a bed to chair (including a wheelchair)?: Total Help needed walking in hospital room?: Total Help needed climbing 3-5 steps with a railing? : Total 6 Click Score: 9    End of Session Equipment Utilized During Treatment: Gait belt Activity Tolerance: Patient limited by fatigue Patient left: in bed;with call bell/phone within reach Nurse Communication: Mobility status PT Visit Diagnosis: Unsteadiness on feet (R26.81);Other abnormalities of gait and mobility (R26.89);Muscle weakness (generalized) (M62.81);Difficulty in walking, not elsewhere classified (R26.2)    Time: 2979-8921 PT Time Calculation (min) (ACUTE ONLY): 25 min   Charges:   PT Evaluation $PT Eval Moderate Complexity: 1 Mod PT Treatments $Therapeutic Activity: 8-22 mins   PT G Codes:        Reinaldo Berber, PT, DPT Acute Rehab Services Pager: [phone]    Reinaldo Berber 01/23/2018, 11:37 AM

## 2018-01-23 NOTE — Progress Notes (Signed)
**Note Francisco-Identified via Obfuscation** PROGRESS NOTE   Francisco Hutchinson  FAO:[id number]    DOB: 16-Jul-1971    DOA: 01/18/2018  PCP: [name], MD   I have briefly reviewed patients previous medical records in [facility].  Brief Summary:  47 year old male with PMH of DM 2, HIV, HTN, CVA, anxiety and depression, presented to ED 6/10 due to worsening right foot wound, progressive darkening of toes.  Admitted for gangrene of right foot.  Orthopedics and vascular surgery consulted.  Underwent angiogram by vascular surgery 6/12 without significant blockage.  Orthopedics planning right transtibial amputation on 6/14..   Assessment & Plan:   Principal Problem:   Necrotizing fasciitis (Francisco Hutchinson) Active Problems:   CKD (chronic kidney disease), stage III (HCC)   Type 2 diabetes mellitus with diabetic chronic kidney disease (Francisco Hutchinson)   HIV disease (Francisco Hutchinson)   Hypertension associated with diabetes (Francisco Hutchinson)   Gangrene of right foot (Francisco Hutchinson)   Severe protein-calorie malnutrition (Francisco Hutchinson)   Gangrene of right forefoot - Orthopedic/[name] was consulted and recommended PAD evaluation by vascular surgery prior to transtibial amputation. - Vascular surgery consulted and patient underwent arteriogram 6/12 without significant blockage. -[name] plans right transtibial amputation 6/14.  As per [name], patient has severe microcirculatory disease with insufficient microcirculation to the midfoot and forefoot. -Continue IV vancomycin and Zosyn.  Clindamycin discontinued. -Orthopedics to advise duration of postop antibiotics.  Blood cultures x2: Negative to date. -Patient is status post right transtibial amputation. -Orthopedic input is appreciated.  Type II DM with peripheral neuropathy and renal complications Had hypoglycemic episode 6/11.  Continue reduced dose of Lantus 15 units at bedtime and SSI.  CBGs mildly uncontrolled and fluctuating.  Currently n.p.o. for surgery this morning.  Postop may need adjustment of his insulins based on  CBGs. -01/23/2018: Continue to optimize  Essential hypertension:  Controlled.   Holding HCTZ due to acute kidney injury.  Continue metoprolol.  Holding amlodipine as well.  Blood pressure starting to mildly increase and may consider resuming amlodipine.  Reassess in a.m.  Acute on stage III chronic kidney disease:  Presented with creatinine of 2.19 which is improved to 1.56 after hydration.  HCTZ on hold.  Continue IV fluids and follow BMP in a.m.  Baseline creatinine likely 1.6.  Creatinine has dropped down to 1.4 on 6/14.  Follow BMP in a.m.  HIV Continue ART.  Outpatient follow-up with ID.  Cannot recall last CD4 count.  Anemia, normocytic Some of this may be dilutional complicating anemia of chronic disease and chronic kidney disease.  Hemoglobin stable in the 9 g range for the last 3 days.  Leukocytosis Secondary to right foot dry gangrene/infectious etiology.  Continue current antibiotics as above and follow postop CBC in a.m.  Hypokalemia:  Replaced.  Magnesium 2.  Hyperlipidemia Continue atorvastatin, fenofibrate.  Depression:  Continue sertraline.  Severe protein calorie malnutrition RD consulted.  DVT prophylaxis: SCDs Code Status: Full Family Communication: None at bedside this morning. Disposition: To be determined pending clinical improvement   Consultants:  Orthopedics/[name] Vascular surgery  Procedures:  Arteriogram 6/2  Antimicrobials:  IV vancomycin and Zosyn.  Discontinued clindamycin.   Subjective: Patient seen this morning prior to surgery.  Denied complaints.  ROS: As above  Objective:  Vitals:   01/22/18 2135 01/23/18 0435 01/23/18 0825 01/23/18 0901  BP: (!) 144/89 (!) 138/91 (!) 158/97 119/65  Pulse: 64   60  Resp:  20 14 (!) 22  Temp:  98.1 F (36.7 C) 97.9 F (36.6 C)  TempSrc:  Oral Oral   SpO2:  99% 100% 99%  Weight:      Height:        Examination: No significant change in clinical exam compared to  yesterday.  General exam: Pleasant young male, moderately built and nourished lying comfortably propped up in bed. Respiratory system: Clear to auscultation. Respiratory effort normal.  Stable Cardiovascular system: S1 & S2 heard, RRR. No JVD, murmurs, rubs, gallops or clicks. No pedal edema.  Telemetry personally reviewed: Sinus rhythm. Gastrointestinal system: Abdomen is nondistended, soft and nontender. No organomegaly or masses felt. Normal bowel sounds heard.  Stable Central nervous system: Alert and oriented. No focal neurological deficits.  Stable Extremities: Symmetric 5 x 5 power.  Right foot exam findings as per pictures below taken 01/18/2018.  No active drainage.  No change in exam since yesterday.  Right upper extremity scar near the axilla from necrotizing fasciitis.  Right upper extremity with decreased bulk. Psychiatry: Judgement and insight appear normal. Mood & affect appropriate.         Data Reviewed: I have personally reviewed following labs and imaging studies  CBC: Recent Labs  Lab 01/18/18 1756 01/19/18 0616 01/20/18 0831 01/21/18 0411 01/22/18 0345 01/23/18 0157  WBC 31.3* 26.8* 24.6* 21.7* 21.2* 14.6*  NEUTROABS 29.8* 24.4*  --   --   --   --   HGB 11.4* 9.8* 9.3* 9.3* 9.0* 8.9*  HCT 33.3* 28.3* 27.5* 27.9* 26.8* 27.4*  MCV 89.3 89.3 89.6 90.9 90.5 92.3  PLT 439* 353 357 346 355 427   Basic Metabolic Panel: Recent Labs  Lab 01/19/18 0616 01/20/18 0813 01/20/18 0831 01/21/18 0411 01/22/18 0345 01/23/18 0157  NA 133*  --  138 137 135 138  K 2.8*  --  3.3* 3.4* 3.6 3.5  CL 98*  --  105 108 108 107  CO2 26  --  24 23 23 24   GLUCOSE 76  --  93 170* 171* 269*  BUN 32*  --  22* 20 14 16   CREATININE 1.98*  --  1.56* 1.50* 1.41* 1.37*  CALCIUM 7.7*  --  7.8* 7.3* 7.4* 7.5*  MG  --  2.0  --   --   --   --    Liver Function Tests: Recent Labs  Lab 01/18/18 1756 01/19/18 0616  AST 33 28  ALT 18 17  ALKPHOS 122 111  BILITOT 0.9 0.8  PROT 8.4*  6.4*  ALBUMIN 2.2* 1.7*   CBG: Recent Labs  Lab 01/22/18 0605 01/22/18 1321 01/22/18 1625 01/22/18 2108 01/23/18 0615  GLUCAP 152* 117* 133* 279* 277*    Recent Results (from the past 240 hour(s))  Blood Culture (routine x 2)     Status: None (Preliminary result)   Collection Time: 01/18/18  7:23 PM  Result Value Ref Range Status   Specimen Description BLOOD LEFT ANTECUBITAL  Final   Special Requests   Final    BOTTLES DRAWN AEROBIC AND ANAEROBIC Blood Culture adequate volume   Culture   Final    NO GROWTH 4 DAYS Performed at [facility], [address], [place], [place] [address]    Report Status PENDING  Incomplete  Blood Culture (routine x 2)     Status: None (Preliminary result)   Collection Time: 01/18/18  7:23 PM  Result Value Ref Range Status   Specimen Description BLOOD LEFT HAND  Final   Special Requests   Final    BOTTLES DRAWN AEROBIC AND ANAEROBIC Blood  Culture adequate volume   Culture   Final    NO GROWTH 4 DAYS Performed at [facility], [place] [address], [place], [place] [address]    Report Status PENDING  Incomplete  MRSA PCR Screening     Status: None   Collection Time: 01/19/18  1:02 PM  Result Value Ref Range Status   MRSA by PCR NEGATIVE NEGATIVE Final    Comment:        The GeneXpert MRSA Assay (FDA approved for NASAL specimens only), is one component of a comprehensive MRSA colonization surveillance program. It is not intended to diagnose MRSA infection nor to guide or monitor treatment for MRSA infections. Performed at [facility], [place] [address] [place], [place] [address]          Radiology Studies: No results found.      Scheduled Meds: . atorvastatin  40 mg Oral Daily  . docusate sodium  100 mg Oral BID  . dolutegravir  50 mg Oral Daily  . emtricitabine-tenofovir AF  1 tablet Oral QHS  . fenofibrate  80 mg Oral QHS  . ferrous gluconate  324 mg Oral Q breakfast  . insulin aspart  0-9 Units  Subcutaneous TID WC  . insulin glargine  15 Units Subcutaneous Q2200  . metoprolol tartrate  100 mg Oral BID  . mupirocin ointment  1 application Nasal BID  . nutrition supplement (JUVEN)  1 packet Oral BID BM  . sertraline  100 mg Oral Daily  . sertraline  75 mg Oral QHS  . sodium chloride flush  3 mL Intravenous Q12H   Continuous Infusions: . sodium chloride 250 mL (01/21/18 1131)  . sodium chloride Stopped (01/22/18 2355)  . lactated ringers Stopped (01/22/18 2200)  . methocarbamol (ROBAXIN)  IV    . piperacillin-tazobactam (ZOSYN)  IV Stopped (01/23/18 3903)  . vancomycin Stopped (01/23/18 0111)     LOS: 5 days     Bonnell Public, MD, FACP, [facility]. [facility] Pager [phone]  If 7PM-7AM, please contact night-coverage [email or web address] Password [email or web address] 01/23/2018, 11:04 AM

## 2018-01-23 NOTE — Plan of Care (Signed)
Adequate for discharge.

## 2018-01-24 LAB — GLUCOSE, CAPILLARY
GLUCOSE-CAPILLARY: 222 mg/dL — AB (ref 65–99)
GLUCOSE-CAPILLARY: 202 mg/dL — AB (ref 65–99)
Glucose-Capillary: 204 mg/dL — ABNORMAL HIGH (ref 65–99)
Glucose-Capillary: 281 mg/dL — ABNORMAL HIGH (ref 65–99)
Glucose-Capillary: 356 mg/dL — ABNORMAL HIGH (ref 65–99)

## 2018-01-24 NOTE — Clinical Social Work Note (Signed)
Clinical Social Work Assessment  Patient Details  Name: Francisco Hutchinson MRN: [id number] Date of Birth: 12-21-1970  Date of referral:  01/24/18               Reason for consult:  Discharge Planning                Permission sought to share information with:  Case Manager Permission granted to share information::  Yes, Verbal Permission Granted  Name::     Best,Kelly B.  Agency::     Relationship::  Partner  Contact Information:  [phone]  Housing/Transportation Living arrangements for the past 2 months:  Single Family Home Source of Information:  Partner, Parent Patient Interpreter Needed:  None Criminal Activity/Legal Involvement Pertinent to Current Situation/Hospitalization:  No - Comment as needed  Significant Relationships:  Soil scientist Lives with:  Significant Other, Relatives Do you feel safe going back to the place where you live?  Yes Need for family participation in patient care:  Yes (Comment)  Care giving concerns:  Pt is alert and oriented. Pt lives at home with partner and aunt prior to admission. Mother also listed as an available caregiver.  Social Worker assessment / plan: Pt is alert and oriented. CSW consulted for SNF placement. CSW met with patient at bedside. Pt is agreeable to SNF placement at discharge. Pt's partner and mother will help determine choice of facility. Pt gave CSW verbal permission to discuss discharge with partner and mother. CSW with f/u with family and fax out accordingly.   Employment status:  Disabled (Comment on whether or not currently receiving Disability) Insurance information:  Managed Medicare PT Recommendations:  [place] / Referral to community resources:  [place]  Patient/Family's Response to care:  Patient/Family's Understanding of and Emotional Response to Diagnosis, Current Treatment, and Prognosis:  Pt agreeable to SNF at discharge. Pt denies any concerns regarding care at  this time.  Emotional Assessment Appearance:  Appears older than stated age Attitude/Demeanor/Rapport:  Sedated Affect (typically observed):  Flat, Calm Orientation:  Oriented to Self, Oriented to Place, Oriented to  Time, Oriented to Situation Alcohol / Substance use:  Not Applicable Psych involvement (Current and /or in the community):  No (Comment)  Discharge Needs  Concerns to be addressed:  Discharge Planning Concerns Readmission within the last 30 days:  No Current discharge risk:  None Barriers to Discharge:  Continued Medical Work up   [employer], LCSW 01/24/2018, 2:49 PM

## 2018-01-24 NOTE — Progress Notes (Signed)
PROGRESS NOTE   Francisco Hutchinson  LGX:[id number]    DOB: 1971/02/25    DOA: 01/18/2018  PCP: Francisco Barrack, MD    Brief Summary:  47 year old male with PMH of DM 2, HIV, HTN, CVA, anxiety and depression, presented to ED 6/10 due to worsening right foot wound, progressive darkening of toes.  Admitted for gangrene of right foot.  Orthopedics and vascular surgery consulted.  Underwent angiogram by vascular surgery 6/12 without significant blockage.  Patient underwent right transtibial amputation on 01/22/2018.  Patient has remained stable.  We will proceed disposition to skilled nursing facility/rehab once a bed is available.    Assessment & Plan:   Principal Problem:   Necrotizing fasciitis ([place]) Active Problems:   CKD (chronic kidney disease), stage III ([place])   Type 2 diabetes mellitus with diabetic chronic kidney disease ([place])   HIV disease ([place])   Hypertension associated with diabetes ([place])   Gangrene of right foot ([place])   Severe protein-calorie malnutrition ([place])   Gangrene of right forefoot - Orthopedic/[name] was consulted and recommended PAD evaluation by vascular surgery prior to transtibial amputation. - Vascular surgery consulted and patient underwent arteriogram 6/12 without significant blockage. -[name] plans right transtibial amputation 6/14.  As per [name], patient has severe microcirculatory disease with insufficient microcirculation to the midfoot and forefoot. -Continue IV vancomycin and Zosyn.  Clindamycin discontinued. -Orthopedics to advise duration of postop antibiotics.  Blood cultures x2: Negative to date. -Patient is status post right transtibial amputation. -Orthopedic input is appreciated. -01/24/2018: Pursue disposition once a bed is available.  Type II DM with peripheral neuropathy and renal complications Had hypoglycemic episode 6/11.  Continue reduced dose of Lantus 15 units at bedtime and SSI.  CBGs mildly uncontrolled and fluctuating.   Currently n.p.o. for surgery this morning.  Postop may need adjustment of his insulins based on CBGs. -01/23/2018: Continue to optimize  Essential hypertension:  Controlled.   Holding HCTZ due to acute kidney injury.  Continue metoprolol.  Holding amlodipine as well.  Blood pressure starting to mildly increase and may consider resuming amlodipine.  Reassess in a.m.  Acute on stage III chronic kidney disease:  Presented with creatinine of 2.19 which is improved to 1.56 after hydration.  HCTZ on hold.  Continue IV fluids and follow BMP in a.m.  Baseline creatinine likely 1.6.  Creatinine has dropped down to 1.4 on 6/14.  Follow BMP in a.m.  HIV Continue ART.  Outpatient follow-up with ID.  Cannot recall last CD4 count.  Anemia, normocytic Some of this may be dilutional complicating anemia of chronic disease and chronic kidney disease.  Hemoglobin stable in the 9 g range for the last 3 days.  Leukocytosis Secondary to right foot dry gangrene/infectious etiology.  Continue current antibiotics as above and follow postop CBC in a.m.  Hypokalemia:  Replaced.   Repeat labs in a.m.    Hyperlipidemia Continue atorvastatin, fenofibrate.  Depression:  Continue sertraline.  Severe protein calorie malnutrition RD consulted.  DVT prophylaxis: SCDs Code Status: Full Family Communication: None at bedside this morning. Disposition: To be determined pending clinical improvement   Consultants:  Orthopedics/[name] Vascular surgery  Procedures:  Arteriogram 6/2  Antimicrobials:  IV vancomycin and Zosyn.  Discontinued clindamycin.   Subjective: Patient seen alongside patient's mom.  No new complaints.  No fever or chills.  No chest pain.  No shortness of breath.    ROS: As above  Objective:  Vitals:   01/23/18 2111 01/23/18 2209 01/24/18  0410 01/24/18 0827  BP: 140/85  (!) 138/96 117/73  Pulse: 66 66 61 66  Resp: 18  20   Temp: 98.1 F (36.7 C)  98.2 F (36.8 C)   TempSrc:  Oral  Oral   SpO2: 100%  100%   Weight:      Height:        Examination: No significant change in clinical exam compared to yesterday.  General exam: Pleasant young male, moderately built and nourished lying comfortably propped up in bed. Respiratory system: Clear to auscultation. Respiratory effort normal.  Stable Cardiovascular system: S1 & S2 heard, RRR. No JVD, murmurs, rubs, gallops or clicks. No pedal edema.  Telemetry personally reviewed: Sinus rhythm. Gastrointestinal system: Abdomen is nondistended, soft and nontender. No organomegaly or masses felt. Normal bowel sounds heard.  Stable Central nervous system: Alert and oriented. No focal neurological deficits.  Stable Extremities: Symmetric 5 x 5 power.  Right foot exam findings as per pictures below taken 01/18/2018.  No active drainage.  No change in exam since yesterday.  Right upper extremity scar near the axilla from necrotizing fasciitis.  Right upper extremity with decreased bulk. Psychiatry: Judgement and insight appear normal. Mood & affect appropriate.         Data Reviewed: I have personally reviewed following labs and imaging studies  CBC: Recent Labs  Lab 01/18/18 1756 01/19/18 0616 01/20/18 0831 01/21/18 0411 01/22/18 0345 01/23/18 0157  WBC 31.3* 26.8* 24.6* 21.7* 21.2* 14.6*  NEUTROABS 29.8* 24.4*  --   --   --   --   HGB 11.4* 9.8* 9.3* 9.3* 9.0* 8.9*  HCT 33.3* 28.3* 27.5* 27.9* 26.8* 27.4*  MCV 89.3 89.3 89.6 90.9 90.5 92.3  PLT 439* 353 357 346 355 671   Basic Metabolic Panel: Recent Labs  Lab 01/19/18 0616 01/20/18 0813 01/20/18 0831 01/21/18 0411 01/22/18 0345 01/23/18 0157  NA 133*  --  138 137 135 138  K 2.8*  --  3.3* 3.4* 3.6 3.5  CL 98*  --  105 108 108 107  CO2 26  --  24 23 23 24   GLUCOSE 76  --  93 170* 171* 269*  BUN 32*  --  22* 20 14 16   CREATININE 1.98*  --  1.56* 1.50* 1.41* 1.37*  CALCIUM 7.7*  --  7.8* 7.3* 7.4* 7.5*  MG  --  2.0  --   --   --   --    Liver  Function Tests: Recent Labs  Lab 01/18/18 1756 01/19/18 0616  AST 33 28  ALT 18 17  ALKPHOS 122 111  BILITOT 0.9 0.8  PROT 8.4* 6.4*  ALBUMIN 2.2* 1.7*   CBG: Recent Labs  Lab 01/23/18 1618 01/23/18 2113 01/24/18 0633 01/24/18 0812 01/24/18 1139  GLUCAP 297* 337* 222* 202* 204*    Recent Results (from the past 240 hour(s))  Blood Culture (routine x 2)     Status: None   Collection Time: 01/18/18  7:23 PM  Result Value Ref Range Status   Specimen Description BLOOD LEFT ANTECUBITAL  Final   Special Requests   Final    BOTTLES DRAWN AEROBIC AND ANAEROBIC Blood Culture adequate volume   Culture   Final    NO GROWTH 5 DAYS Performed at [facility], [address], [place], [place] [address]    Report Status 01/23/2018 FINAL  Final  Blood Culture (routine x 2)     Status: None   Collection Time: 01/18/18  7:23  PM  Result Value Ref Range Status   Specimen Description BLOOD LEFT HAND  Final   Special Requests   Final    BOTTLES DRAWN AEROBIC AND ANAEROBIC Blood Culture adequate volume   Culture   Final    NO GROWTH 5 DAYS Performed at [facility], [address], [place], [place] [address]    Report Status 01/23/2018 FINAL  Final  MRSA PCR Screening     Status: None   Collection Time: 01/19/18  1:02 PM  Result Value Ref Range Status   MRSA by PCR NEGATIVE NEGATIVE Final    Comment:        The GeneXpert MRSA Assay (FDA approved for NASAL specimens only), is one component of a comprehensive MRSA colonization surveillance program. It is not intended to diagnose MRSA infection nor to guide or monitor treatment for MRSA infections. Performed at [facility], [place] [address], [place], [place] [address]          Radiology Studies: No results found.      Scheduled Meds: . atorvastatin  40 mg Oral Daily  . docusate sodium  100 mg Oral BID  . dolutegravir  50 mg Oral Daily  . emtricitabine-tenofovir AF  1 tablet Oral QHS  . fenofibrate   80 mg Oral QHS  . ferrous gluconate  324 mg Oral Q breakfast  . insulin aspart  0-9 Units Subcutaneous TID WC  . insulin glargine  15 Units Subcutaneous Q2200  . metoprolol tartrate  100 mg Oral BID  . nutrition supplement (JUVEN)  1 packet Oral BID BM  . sertraline  100 mg Oral Daily  . sertraline  75 mg Oral QHS  . sodium chloride flush  3 mL Intravenous Q12H   Continuous Infusions: . sodium chloride 250 mL (01/21/18 1131)  . sodium chloride Stopped (01/22/18 2355)  . lactated ringers Stopped (01/22/18 2200)  . methocarbamol (ROBAXIN)  IV    . piperacillin-tazobactam (ZOSYN)  IV 3.375 g (01/24/18 1233)  . vancomycin Stopped (01/24/18 0240)     LOS: 6 days     Bonnell Public, MD, FACP, [facility]. [facility] Pager [phone]  If 7PM-7AM, please contact night-coverage [email or web address] Password [facility] 01/24/2018, 2:08 PM

## 2018-01-25 DIAGNOSIS — B372 Candidiasis of skin and nail: Secondary | ICD-10-CM | POA: Diagnosis not present

## 2018-01-25 DIAGNOSIS — M2550 Pain in unspecified joint: Secondary | ICD-10-CM | POA: Diagnosis not present

## 2018-01-25 DIAGNOSIS — Z741 Need for assistance with personal care: Secondary | ICD-10-CM | POA: Diagnosis not present

## 2018-01-25 DIAGNOSIS — Z89511 Acquired absence of right leg below knee: Secondary | ICD-10-CM

## 2018-01-25 DIAGNOSIS — E43 Unspecified severe protein-calorie malnutrition: Secondary | ICD-10-CM | POA: Diagnosis not present

## 2018-01-25 DIAGNOSIS — N183 Chronic kidney disease, stage 3 (moderate): Secondary | ICD-10-CM | POA: Diagnosis not present

## 2018-01-25 DIAGNOSIS — E441 Mild protein-calorie malnutrition: Secondary | ICD-10-CM | POA: Diagnosis not present

## 2018-01-25 DIAGNOSIS — N179 Acute kidney failure, unspecified: Secondary | ICD-10-CM | POA: Diagnosis not present

## 2018-01-25 DIAGNOSIS — I10 Essential (primary) hypertension: Secondary | ICD-10-CM | POA: Diagnosis not present

## 2018-01-25 DIAGNOSIS — R262 Difficulty in walking, not elsewhere classified: Secondary | ICD-10-CM | POA: Diagnosis not present

## 2018-01-25 DIAGNOSIS — F419 Anxiety disorder, unspecified: Secondary | ICD-10-CM | POA: Diagnosis not present

## 2018-01-25 DIAGNOSIS — L732 Hidradenitis suppurativa: Secondary | ICD-10-CM | POA: Diagnosis not present

## 2018-01-25 DIAGNOSIS — Z794 Long term (current) use of insulin: Secondary | ICD-10-CM | POA: Diagnosis not present

## 2018-01-25 DIAGNOSIS — Z872 Personal history of diseases of the skin and subcutaneous tissue: Secondary | ICD-10-CM | POA: Diagnosis not present

## 2018-01-25 DIAGNOSIS — R278 Other lack of coordination: Secondary | ICD-10-CM | POA: Diagnosis not present

## 2018-01-25 DIAGNOSIS — M726 Necrotizing fasciitis: Secondary | ICD-10-CM | POA: Diagnosis not present

## 2018-01-25 DIAGNOSIS — B20 Human immunodeficiency virus [HIV] disease: Secondary | ICD-10-CM | POA: Diagnosis not present

## 2018-01-25 DIAGNOSIS — Z7401 Bed confinement status: Secondary | ICD-10-CM | POA: Diagnosis not present

## 2018-01-25 DIAGNOSIS — E119 Type 2 diabetes mellitus without complications: Secondary | ICD-10-CM | POA: Diagnosis not present

## 2018-01-25 DIAGNOSIS — Z8673 Personal history of transient ischemic attack (TIA), and cerebral infarction without residual deficits: Secondary | ICD-10-CM | POA: Diagnosis not present

## 2018-01-25 DIAGNOSIS — E1165 Type 2 diabetes mellitus with hyperglycemia: Secondary | ICD-10-CM | POA: Diagnosis not present

## 2018-01-25 DIAGNOSIS — E785 Hyperlipidemia, unspecified: Secondary | ICD-10-CM | POA: Diagnosis not present

## 2018-01-25 DIAGNOSIS — I96 Gangrene, not elsewhere classified: Secondary | ICD-10-CM | POA: Diagnosis not present

## 2018-01-25 DIAGNOSIS — F324 Major depressive disorder, single episode, in partial remission: Secondary | ICD-10-CM | POA: Diagnosis not present

## 2018-01-25 DIAGNOSIS — M6281 Muscle weakness (generalized): Secondary | ICD-10-CM | POA: Diagnosis not present

## 2018-01-25 DIAGNOSIS — E1122 Type 2 diabetes mellitus with diabetic chronic kidney disease: Secondary | ICD-10-CM | POA: Diagnosis not present

## 2018-01-25 LAB — RENAL FUNCTION PANEL
Albumin: 1.7 g/dL — ABNORMAL LOW (ref 3.5–5.0)
Anion gap: 4 — ABNORMAL LOW (ref 5–15)
BUN: 17 mg/dL (ref 6–20)
CO2: 26 mmol/L (ref 22–32)
Calcium: 7.5 mg/dL — ABNORMAL LOW (ref 8.9–10.3)
Chloride: 109 mmol/L (ref 101–111)
Creatinine, Ser: 1.24 mg/dL (ref 0.61–1.24)
GFR calc Af Amer: >60 mL/min (ref >60)
GFR calc non Af Amer: >60 mL/min (ref >60)
Glucose, Bld: 246 mg/dL — ABNORMAL HIGH (ref 65–99)
Phosphorus: 2 mg/dL — ABNORMAL LOW (ref 2.5–4.6)
Potassium: 3.8 mmol/L (ref 3.5–5.1)
Sodium: 139 mmol/L (ref 135–145)

## 2018-01-25 LAB — PHOSPHORUS: Phosphorus: 2.1 mg/dL — ABNORMAL LOW (ref 2.5–4.6)

## 2018-01-25 LAB — GLUCOSE, CAPILLARY
GLUCOSE-CAPILLARY: 248 mg/dL — AB (ref 65–99)
GLUCOSE-CAPILLARY: 286 mg/dL — AB (ref 65–99)
Glucose-Capillary: 235 mg/dL — ABNORMAL HIGH (ref 65–99)

## 2018-01-25 LAB — MAGNESIUM: Magnesium: 2.1 mg/dL (ref 1.7–2.4)

## 2018-01-25 MED ORDER — POTASSIUM PHOSPHATE MONOBASIC 500 MG PO TABS
500.0000 mg | ORAL_TABLET | Freq: Three times a day (TID) | ORAL | Status: DC
  Filled 2018-01-25: qty 1

## 2018-01-25 MED ORDER — LANTUS SOLOSTAR 100 UNIT/ML ~~LOC~~ SOPN: 55.0000 [IU] | PEN_INJECTOR | Freq: Every day | SUBCUTANEOUS | 3 refills | Status: DC

## 2018-01-25 MED ORDER — INSULIN ASPART 100 UNIT/ML ~~LOC~~ SOLN
4.0000 [IU] | Freq: Three times a day (TID) | SUBCUTANEOUS | Status: DC
  Administered 2018-01-25 (×2): 4 [IU] via SUBCUTANEOUS

## 2018-01-25 MED ORDER — DOCUSATE SODIUM 100 MG PO CAPS: 100.0000 mg | ORAL_CAPSULE | Freq: Every day | ORAL | 0 refills | Status: DC | PRN

## 2018-01-25 MED ORDER — POLYETHYLENE GLYCOL 3350 17 G PO PACK: 17.0000 g | PACK | Freq: Every day | ORAL | 0 refills | Status: DC | PRN

## 2018-01-25 MED ORDER — K PHOS MONO-SOD PHOS DI & MONO 155-852-130 MG PO TABS
500.0000 mg | ORAL_TABLET | Freq: Three times a day (TID) | ORAL | Status: DC
  Administered 2018-01-25 (×2): 500 mg via ORAL
  Filled 2018-01-25 (×5): qty 2

## 2018-01-25 MED ORDER — FERROUS GLUCONATE 324 (38 FE) MG PO TABS: 324.0000 mg | ORAL_TABLET | Freq: Every day | ORAL | 0 refills | Status: AC

## 2018-01-25 MED ORDER — JUVEN PO PACK
1.0000 | PACK | Freq: Two times a day (BID) | ORAL | 0 refills | Status: DC
Start: 2018-01-26 — End: 2018-04-01

## 2018-01-25 MED ORDER — INSULIN GLARGINE 100 UNIT/ML ~~LOC~~ SOLN
20.0000 [IU] | Freq: Every day | SUBCUTANEOUS | Status: DC
  Filled 2018-01-25: qty 0.2

## 2018-01-25 MED ORDER — INSULIN ASPART 100 UNIT/ML ~~LOC~~ SOLN: 4.0000 [IU] | Freq: Three times a day (TID) | SUBCUTANEOUS | 11 refills | Status: DC

## 2018-01-25 MED ORDER — METHOCARBAMOL 500 MG PO TABS: 500.0000 mg | ORAL_TABLET | Freq: Three times a day (TID) | ORAL | 0 refills | Status: DC | PRN

## 2018-01-25 MED ORDER — ACETAMINOPHEN 325 MG PO TABS: 325.0000 mg | ORAL_TABLET | Freq: Four times a day (QID) | ORAL | 0 refills | Status: DC | PRN

## 2018-01-25 MED ORDER — OXYCODONE HCL 5 MG PO TABS: 5.0000 mg | ORAL_TABLET | ORAL | 0 refills | Status: DC | PRN

## 2018-01-25 NOTE — Progress Notes (Signed)
Patient ID: Francisco Hutchinson, male   DOB: 07/01/1971, 47 y.o.   MRN: [id number] Postoperative day 3 transtibial amputation.  Patient has no complaints he has full extension of his knee the wound VAC canister is dry.  Plan for discharge with the Praveena plus portable wound VAC that will stay in place for 1 week.  Plan for discharge to skilled nursing.

## 2018-01-25 NOTE — Progress Notes (Signed)
Clinical Social Worker is following pateint and family fir support and discharge needs. Patient has been  worked up by weekend Education officer, museum and was faxed out. CSW contacted patients spouse this morning to make him aware of MD plan to discharge patient today. CSW stated to patients partner that insurance has given authorization but they just need a facility that patient will discharge too. CSW and patients partner [facility]) went back and forth with several bed offers until kelly made decision.   4:30:  CSW met patient, mom and [name] at bedside per family's request. [name] stated they have decided on [facility]. Family stated they hope this facility is a better fit for patient.  Rhea Pink, MSW,  [place]

## 2018-01-25 NOTE — Progress Notes (Signed)
Occupational Therapy Treatment Patient Details Name: Nafis Farnan MRN: [id number] DOB: 19-Sep-1970 Today's Date: 01/25/2018    History of present illness 47 year old male with PMH of DM 2, HIV, HTN, CVA, anxiety and depression, presented to ED 6/10 due to worsening right foot wound, progressive darkening of toes.  Admitted for gangrene of right foot.  Orthopedics and vascular surgery consulted.  Underwent angiogram by vascular surgery 6/12 without significant blockage.  Orthopedics planning right transtibial amputation on 6/14.    OT comments  Return for second visit upon RN request to assist in safe functional mobility and return to bed. Pt performing lateral scoot with Max A +2 and requiring Max cues for hand placement, weight shifting, and sequencing. Pt with fatigue since sitting up in recliner and requiring increased assistance compared to prior session in AM. Continue to recommend dc to SNF for further OT and will continue to follow acutely as admitted.    Follow Up Recommendations  SNF;Supervision/Assistance - 24 hour    Equipment Recommendations  Other (comment)(Defer to next venue)    Recommendations for Other Services PT consult    Precautions / Restrictions Precautions Precautions: Fall Restrictions Weight Bearing Restrictions: No RLE Weight Bearing: Non weight bearing       Mobility Bed Mobility Overal bed mobility: Needs Assistance Bed Mobility: Sit to Supine Rolling: Supervision Sidelying to sit: Min assist   Sit to supine: Supervision   General bed mobility comments: supervision for safety. Pt with impulsive movements and demonstrating decreased awareness  Transfers Overall transfer level: Needs assistance Equipment used: Rolling walker (2 wheeled) Transfers: Lateral/Scoot Transfers;Sit to/from Stand Sit to Stand: Total assist;+2 physical assistance        Lateral/Scoot Transfers: +2 physical assistance;Max assist General transfer comment: Lateral  scoot from recliner to EOB with Max A +2 and Max cues for hand placement and weight shifting.     Balance Overall balance assessment: Needs assistance Sitting-balance support: No upper extremity supported;Feet supported Sitting balance-Leahy Scale: Fair                                     ADL either performed or assessed with clinical judgement   ADL Overall ADL's : Needs assistance/impaired Eating/Feeding: Set up;Sitting   Grooming: Set up;Sitting   Upper Body Bathing: Minimal assistance;Sitting   Lower Body Bathing: Maximal assistance;Sitting/lateral leans;Bed level   Upper Body Dressing : Minimal assistance;Sitting   Lower Body Dressing: Maximal assistance;Sitting/lateral leans;Bed level   Toilet Transfer: Requires drop arm;+2 for physical assistance;+2 for safety/equipment;Maximal assistance;Cueing for sequencing(laterally scooting) Toilet Transfer Details (indicate cue type and reason): Laterally scooting towards left with Max A +2. Pt requiring increased cues for sequencing and intating of steps in transfer.          Functional mobility during ADLs: +2 for safety/equipment;+2 for physical assistance;Maximal assistance(Lateral scoot to bed) General ADL Comments: Returning upon RN request to assist in transfer to bed from drop arm recliner. Pt requiring increased assistance with Max A +2 with left knee blocked and use of bed pad to pull hips towards EOB.      Vision       Perception     Praxis      Cognition Arousal/Alertness: Awake/alert Behavior During Therapy: WFL for tasks assessed/performed Overall Cognitive Status: Within Functional Limits for tasks assessed  Exercises     Shoulder Instructions       General Comments Mother present during session    Pertinent Vitals/ Pain       Pain Assessment: No/denies pain  Home Living Family/patient expects to be discharged to:: Skilled  nursing facility Living Arrangements: Spouse/significant other                               Additional Comments: Pt living with his aunt and partner PTA.      Prior Functioning/Environment Level of Independence: Needs assistance  Gait / Transfers Assistance Needed: Pt reports he laterally scoots to w/c ADL's / Homemaking Assistance Needed:  Pt's aunt [name]) assisted him with BADLs. Including sponge bathes at EOB, dressing, and toileting.       Frequency  Min 2X/week        Progress Toward Goals  OT Goals(current goals can now be found in the care plan section)  Progress towards OT goals: Progressing toward goals  Acute Rehab OT Goals Patient Stated Goal: walk and golf again OT Goal Formulation: With patient Time For Goal Achievement: 02/08/18 Potential to Achieve Goals: Good ADL Goals Pt Will Perform Lower Body Dressing: with set-up;with supervision;sitting/lateral leans;bed level Pt Will Transfer to Toilet: with set-up;with supervision;with transfer board;bedside commode(drop arm) Pt Will Perform Toileting - Clothing Manipulation and hygiene: with set-up;with supervision;sitting/lateral leans Additional ADL Goal #1: Pt will perform bed mobility at Mod I level in preparation for ADLs  Plan Discharge plan remains appropriate    Co-evaluation                 AM-PAC PT "6 Clicks" Daily Activity     Outcome Measure   Help from another person eating meals?: None Help from another person taking care of personal grooming?: A Little Help from another person toileting, which includes using toliet, bedpan, or urinal?: A Lot Help from another person bathing (including washing, rinsing, drying)?: A Lot Help from another person to put on and taking off regular upper body clothing?: A Little Help from another person to put on and taking off regular lower body clothing?: A Lot 6 Click Score: 16    End of Session Equipment Utilized During Treatment: Gait  belt  OT Visit Diagnosis: Unsteadiness on feet (R26.81);Other abnormalities of gait and mobility (R26.89);Muscle weakness (generalized) (M62.81)   Activity Tolerance Patient tolerated treatment well   Patient Left in bed;with call bell/phone within reach;with family/visitor present   Nurse Communication Mobility status        Time: 7793-9030 OT Time Calculation (min): 11 min  Charges: OT General Charges $OT Visit: 1 Visit OT Evaluation $OT Eval Moderate Complexity: 1 Mod OT Treatments $Self Care/Home Management : 8-22 mins  [place], OTR/L Acute Rehab Pager: [phone] Office: [place] 01/25/2018, 4:53 PM

## 2018-01-25 NOTE — Progress Notes (Signed)
Changed wound vac to proveena plus for discharge to [facility] [phone]). Called report to RN receiving pt at [facility] and all questions answered. Pt awaiting PTAR for transport to facility. Pt resting with call bell within reach.  Will continue to monitor. Mother at bedside.  Payton Emerald, RN  '

## 2018-01-25 NOTE — Progress Notes (Signed)
Clinical Social Worker facilitated patient discharge including contacting patient family and facility to confirm patient discharge plans.  Clinical information faxed to facility and family agreeable with plan.Facility has received authorization through insurance.  CSW arranged ambulance transport via PT[facility] to [facility] .  RN to call [phone] (pt will go in rm# [phone], ask for 200 RN) report prior to discharge.  Clinical Social Worker will sign off for now as social work intervention is no longer needed. Please consult [place] again if new need arises.  Rhea Pink, MSW, [place]

## 2018-01-25 NOTE — NC FL2 (Signed)
[place] MEDICAID FL2 LEVEL OF CARE SCREENING TOOL     IDENTIFICATION  Patient Name: Francisco Hutchinson Birthdate: [birth year] Sex: male Admission Date (Current Location): 01/18/2018  [facility] and [place] Number:  Herbalist and Address:  [facility] [place], [place] [address], [place], [place] [address]      Provider Number: [id number]  Attending Physician Name and Address:  Louellen Molder, MD  Relative Name and Phone Number:  Heather Roberts.    Current Level of Care: Hospital Recommended Level of Care: [place] Prior Approval Number:    Date Approved/Denied:   PASRR Number: [id number]  Discharge Plan: SNF    Current Diagnoses: Patient Active Problem List   Diagnosis Date Noted  . Gangrene of right foot ([place])   . Severe protein-calorie malnutrition ([place])   . Necrotizing fasciitis ([place]) 01/18/2018  . Depression, major, single episode, in partial remission ([place]) 07/30/2017  . Hyperlipidemia associated with type 2 diabetes mellitus ([place]) 06/24/2017  . Hypertension associated with diabetes ([place]) 06/24/2017  . Hidradenitis 05/29/2017  . Candidal intertrigo 05/29/2017  . Stroke ([place])   . HIV disease ([place])   . CKD (chronic kidney disease), stage III ([place]) 03/25/2017  . Type 2 diabetes mellitus with diabetic chronic kidney disease ([place]) 03/25/2017    Orientation RESPIRATION BLADDER Height & Weight     Self, Time, Situation, Place  Normal External catheter, Incontinent Weight: 245 lb (111.1 kg) Height:  5\' 11"  (180.3 cm)  BEHAVIORAL SYMPTOMS/MOOD NEUROLOGICAL BOWEL NUTRITION STATUS      Incontinent Diet(carb diet, thin fluid)  AMBULATORY STATUS COMMUNICATION OF NEEDS Skin   Extensive Assist Verbally Other (Comment)(incision right leg wound. )                       Personal Care Assistance Level of Assistance  Bathing, Feeding, Dressing Bathing Assistance: Limited assistance Feeding assistance: Independent Dressing  Assistance: Limited assistance     Functional Limitations Info  Sight, Hearing, Speech Sight Info: Adequate Hearing Info: Adequate Speech Info: Adequate    SPECIAL CARE FACTORS FREQUENCY  PT (By licensed PT)     PT Frequency: 2x              Contractures Contractures Info: Not present    Additional Factors Info  Code Status, Allergies Code Status Info: Full Allergies Info: None           Current Medications (01/25/2018):  This is the current hospital active medication list Current Facility-Administered Medications  Medication Dose Route Frequency Provider Last Rate Last Dose  . acetaminophen (TYLENOL) tablet 325-650 mg  325-650 mg Oral Q6H PRN [name], MD      . atorvastatin (LIPITOR) tablet 40 mg  40 mg Oral Daily [name], MD   40 mg at 01/25/18 1035  . bisacodyl (DULCOLAX) suppository 10 mg  10 mg Rectal Daily PRN [name], MD      . docusate sodium (COLACE) capsule 100 mg  100 mg Oral BID [name], MD   100 mg at 01/25/18 1034  . dolutegravir (TIVICAY) tablet 50 mg  50 mg Oral Daily [name], MD   50 mg at 01/25/18 1034  . emtricitabine-tenofovir AF (DESCOVY) 200-25 MG per tablet 1 tablet  1 tablet Oral QHS [name], MD   1 tablet at 01/24/18 2152  . fenofibrate tablet 80 mg  80 mg Oral QHS [name], MD   80  mg at 01/24/18 2154  . ferrous gluconate (FERGON) tablet 324 mg  324 mg Oral Q breakfast [name], MD   324 mg at 01/25/18 1034  . hydrALAZINE (APRESOLINE) injection 5 mg  5 mg Intravenous Q20 Min PRN [name], MD      . HYDROmorphone (DILAUDID) injection 0.5-1 mg  0.5-1 mg Intravenous Q4H PRN [name], MD      . insulin aspart (novoLOG) injection 0-9 Units  0-9 Units Subcutaneous TID WC [name], MD   3 Units at 01/25/18 [phone]  . insulin aspart (novoLOG) injection 4 Units  4 Units Subcutaneous TID WC Dhungel, Nishant, MD      . insulin glargine (LANTUS) injection 20 Units  20 Units Subcutaneous  Q2200 Dhungel, Nishant, MD      . labetalol (NORMODYNE,TRANDATE) injection 10 mg  10 mg Intravenous Q10 min PRN [name], MD      . lactated ringers infusion   Intravenous Continuous [name], MD   Stopped at 01/22/18 2200  . magnesium citrate solution 1 Bottle  1 Bottle Oral Once PRN [name], MD      . methocarbamol (ROBAXIN) tablet 500 mg  500 mg Oral Q6H PRN [name], MD       Or  . methocarbamol (ROBAXIN) 500 mg in dextrose 5 % 50 mL IVPB  500 mg Intravenous Q6H PRN [name], MD      . metoCLOPramide (REGLAN) tablet 5-10 mg  5-10 mg Oral Q8H PRN [name], MD       Or  . metoCLOPramide (REGLAN) injection 5-10 mg  5-10 mg Intravenous Q8H PRN [name], MD      . metoprolol tartrate (LOPRESSOR) tablet 100 mg  100 mg Oral BID [name], MD   100 mg at 01/25/18 1035  . nutrition supplement (JUVEN) (JUVEN) powder packet 1 packet  1 packet Oral BID BM Modena Jansky, MD   1 packet at 01/25/18 1034  . ondansetron (ZOFRAN) tablet 4 mg  4 mg Oral Q6H PRN [name], MD       Or  . ondansetron [facility]) injection 4 mg  4 mg Intravenous Q6H PRN [name], MD      . oxyCODONE (Oxy IR/ROXICODONE) immediate release tablet 10-15 mg  10-15 mg Oral Q4H PRN [name], MD      . oxyCODONE (Oxy IR/ROXICODONE) immediate release tablet 5-10 mg  5-10 mg Oral Q4H PRN [name], MD      . phosphorus (K PHOS NEUTRAL) tablet 500 mg  500 mg Oral TID WC & HS Dhungel, Nishant, MD   500 mg at 01/25/18 1035  . polyethylene glycol (MIRALAX / GLYCOLAX) packet 17 g  17 g Oral Daily PRN [name], MD      . sertraline (ZOLOFT) tablet 75 mg  75 mg Oral QHS [name], MD   75 mg at 01/24/18 2153  . sodium chloride flush (NS) 0.9 % injection 3 mL  3 mL Intravenous Q12H [name], MD   3 mL at 01/25/18 1036  . sodium chloride flush (NS) 0.9 % injection 3 mL  3 mL Intravenous PRN [name], MD         Discharge Medications: Please see  discharge summary for a list of discharge medications.  Relevant Imaging Results:  Relevant Lab Results:   Additional Information SN: [id number]  Gorden Harms  Sherral Hammers, LCSW

## 2018-01-25 NOTE — Evaluation (Signed)
Occupational Therapy Evaluation Patient Details Name: Francisco Hutchinson MRN: [id number] DOB: 10-20-1970 Today's Date: 01/25/2018    History of Present Illness 47 year old male with PMH of DM 2, HIV, HTN, CVA, anxiety and depression, presented to ED 6/10 due to worsening right foot wound, progressive darkening of toes.  Admitted for gangrene of right foot.  Orthopedics and vascular surgery consulted.  Underwent angiogram by vascular surgery 6/12 without significant blockage.  Orthopedics planning right transtibial amputation on 6/14.    Clinical Impression   PTA, pt was living with his Aunt and significant other and required assistance for bathing/dressing and laterally scooted to his w/c. Pt currently requiring Min A for UB ADLs, Max A for LB ADLs, and Mod A +2 for lateral scooting to recliner. Pt presenting with decreased strength, balance, and activity tolerance. Pt would benefit from further acute OT to facilitate safe dc. Recommend dc to SNF for further OT to optimize safety, independence with ADLs, and return to PLOF.      Follow Up Recommendations  SNF;Supervision/Assistance - 24 hour    Equipment Recommendations  Other (comment)(Defer to next venue)    Recommendations for Other Services PT consult     Precautions / Restrictions Precautions Precautions: Fall Restrictions Weight Bearing Restrictions: No RLE Weight Bearing: Non weight bearing      Mobility Bed Mobility Overal bed mobility: Needs Assistance Bed Mobility: Rolling;Sidelying to Sit Rolling: Supervision Sidelying to sit: Min assist       General bed mobility comments: Supervision for rolling and then Min A to push into sitting.   Transfers Overall transfer level: Needs assistance Equipment used: Rolling walker (2 wheeled) Transfers: Lateral/Scoot Transfers;Sit to/from Stand Sit to Stand: Total assist;+2 physical assistance        Lateral/Scoot Transfers: Min assist;Mod assist;+2 safety/equipment;From  elevated surface General transfer comment: Total A +2 for attempt to sit<>Stand with RW, but pt unable to push into standing. Min A for blocking left knee and assistance for transitioning hips with use of bed pad    Balance Overall balance assessment: Needs assistance Sitting-balance support: No upper extremity supported;Feet supported Sitting balance-Leahy Scale: Fair                                     ADL either performed or assessed with clinical judgement   ADL Overall ADL's : Needs assistance/impaired Eating/Feeding: Set up;Sitting   Grooming: Set up;Sitting   Upper Body Bathing: Minimal assistance;Sitting   Lower Body Bathing: Maximal assistance;Sitting/lateral leans;Bed level   Upper Body Dressing : Minimal assistance;Sitting   Lower Body Dressing: Maximal assistance;Sitting/lateral leans;Bed level   Toilet Transfer: Minimal assistance;+2 for safety/equipment;Requires drop arm;Moderate assistance(laterally scooting)           Functional mobility during ADLs: Minimal assistance;+2 for safety/equipment;Moderate assistance(lateral scoot to drop arm recliner) General ADL Comments: Pt demonstrating decreased strength, balance, and activity tolerance. Pt performing lateral scoot to right with MIn A for blocking left knee and occasional Mod A with bedpad to transition hips to recliner     Vision         Perception     Praxis      Pertinent Vitals/Pain Pain Assessment: No/denies pain     Hand Dominance Right   Extremity/Trunk Assessment Upper Extremity Assessment Upper Extremity Assessment: Generalized weakness   Lower Extremity Assessment Lower Extremity Assessment: RLE deficits/detail RLE Deficits / Details: right BKA 6/14   Cervical / Trunk  Assessment Cervical / Trunk Assessment: Other exceptions Cervical / Trunk Exceptions: Increased body habitus   Communication Communication Communication: No difficulties   Cognition  Arousal/Alertness: Awake/alert Behavior During Therapy: WFL for tasks assessed/performed Overall Cognitive Status: Within Functional Limits for tasks assessed                                     General Comments  VSS    Exercises     Shoulder Instructions      Home Living Family/patient expects to be discharged to:: Skilled nursing facility Living Arrangements: Spouse/significant other                               Additional Comments: Pt living with his aunt and partner PTA.      Prior Functioning/Environment Level of Independence: Needs assistance  Gait / Transfers Assistance Needed: Pt reports he laterally scoots to w/c ADL's / Homemaking Assistance Needed:  Pt's aunt Francisco Hutchinson) assisted him with BADLs. Including sponge bathes at EOB, dressing, and toileting.            OT Problem List: Decreased strength;Decreased range of motion;Decreased activity tolerance;Impaired balance (sitting and/or standing)      OT Treatment/Interventions: Self-care/ADL training;Therapeutic exercise;Energy conservation;DME and/or AE instruction;Therapeutic activities;Patient/family education    OT Goals(Current goals can be found in the care plan section) Acute Rehab OT Goals Patient Stated Goal: walk and golf again OT Goal Formulation: With patient Time For Goal Achievement: 02/08/18 Potential to Achieve Goals: Good ADL Goals Pt Will Perform Lower Body Dressing: with set-up;with supervision;sitting/lateral leans;bed level Pt Will Transfer to Toilet: with set-up;with supervision;with transfer board;bedside commode(drop arm) Pt Will Perform Toileting - Clothing Manipulation and hygiene: with set-up;with supervision;sitting/lateral leans Additional ADL Goal #1: Pt will perform bed mobility at Mod I level in preparation for ADLs  OT Frequency: Min 2X/week   Barriers to D/C:            Co-evaluation              AM-PAC PT "6 Clicks" Daily Activity      Outcome Measure Help from another person eating meals?: None Help from another person taking care of personal grooming?: A Little Help from another person toileting, which includes using toliet, bedpan, or urinal?: A Lot Help from another person bathing (including washing, rinsing, drying)?: A Lot Help from another person to put on and taking off regular upper body clothing?: A Little Help from another person to put on and taking off regular lower body clothing?: A Lot 6 Click Score: 16   End of Session Equipment Utilized During Treatment: Gait belt Nurse Communication: Mobility status  Activity Tolerance: Patient tolerated treatment well Patient left: in chair  OT Visit Diagnosis: Unsteadiness on feet (R26.81);Other abnormalities of gait and mobility (R26.89);Muscle weakness (generalized) (M62.81)                Time: 4580-9983 OT Time Calculation (min): 20 min Charges:  OT General Charges $OT Visit: 1 Visit OT Evaluation $OT Eval Moderate Complexity: 1 Mod G-Codes:     [place] MSOT, OTR/L Acute Rehab Pager: [phone] Office: [place] 01/25/2018, 1:28 PM

## 2018-01-25 NOTE — Care Management Important Message (Signed)
Important Message  Patient Details  Name: Francisco Hutchinson MRN: [id number] Date of Birth: [birth year]   Medicare Important Message Given:  Yes    Celestine Prim P Cassidy Tashiro 01/25/2018, 5:02 PM

## 2018-01-25 NOTE — Discharge Summary (Addendum)
Physician Discharge Summary  Francisco Hutchinson HKV:[id number] DOB: 01/04/1971 DOA: 01/18/2018  PCP: [name], MD  Admit date: 01/18/2018 Discharge date: 01/25/2018  Admitted From: Home Disposition: Skilled nursing facility  Recommendations for Outpatient Follow-up:  1. Follow-up with MD at SNF in 1 week.  Follow-up with [name] in 2 weeks with Praveena plus portable wound VAC and needs to stay in for 1 week. 2.   Home Health: None Equipment/Devices:As per therapy at the facility  Discharge Condition: Fair CODE STATUS: Full code Diet recommendation: Heart Healthy / Carb Modified     Discharge Diagnoses:  Principal Problem: Gangrene of right forefoot ([place])   Active Problems:   CKD (chronic kidney disease), stage III ([place])   Type 2 diabetes mellitus with diabetic chronic kidney disease, uncontrolled with hyperglycemia ([place])   HIV disease ([place])   Hypertension associated with diabetes ([place])   Severe protein-calorie malnutrition ([facility]) Obesity (BMI 34.17 kg/m)  Brief narrative/HPI Please refer to admission H&P for details, in brief,47 year old male with PMH of DM 2, HIV, HTN, CVA, anxiety and depression, presented to ED 6/10 due to worsening right foot wound, progressive darkening of toes.  Admitted for gangrene of right foot.  Orthopedics and vascular surgery consulted.  Underwent angiogram by vascular surgery 6/12 without significant blockage.  Patient underwent right transtibial amputation on 01/22/2018.  Patient has remained stable.    Hospital course Gangrene of right forefoot Patient underwent arteriogram on 6/12 without significant blockage.  Underwent transtibial amputation on 6/14.  As per [name] he has severe microcirculatory disease with insufficient microcirculation to the midfoot and forefoot.  Received empiric vancomycin and Zosyn which has been discontinued.  Blood cultures  negative. Patient will be discharged to SNF on Praveena wound VAC for 1 week.   Should follow-up with orthopedics in 2 weeks. Pain control with PRN Vicodin and Robaxin.  Bowel regimen added.  Uncontrolled type 2 diabetes mellitus with chronic kidney disease, peripheral neuropathy and hyperglycemia.  A1c >11.  Due to hypoglycemic episode on 6/11 Lantus dose reduced to 15 units at bedtime with sliding scale coverage.  Now CBG elevated.  Patient was on 55 units Lantus daily at home along with weekly Trulicity.  Will change Lantus to 40 units at bedtime and add pre-meal aspart 4 units 3 times daily.  Needs tighter blood glucose control as outpatient.  Essential hypertension HCTZ was held due to acute kidney injury, now resumed.  Continue metoprolol and amlodipine.  Acute on chronic kidney disease stage III Possibly due to dehydration.  HCTZ held and hydrated.  Renal function now stable.  Type-2 (HIV 2) as cause of disease classified elsewhere Continue ART.  Outpatient follow-up with ID.  CD4 of 300.  Protein calorie malnutrition, severe Added supplement  Disposition: SNF  Consults: [name] Procedure: Right trans-tibial amputation   Discharge Instructions  Discharge Instructions    Negative Pressure Wound Therapy - Incisional   Complete by:  As directed    Pravena pump for 1 week     Allergies as of 01/25/2018   No Known Allergies     Medication List    STOP taking these medications   chlorhexidine 4 % external liquid Commonly known as:  HIBICLENS   doxycycline 100 MG tablet Commonly known as:  VIBRA-TABS   hydrocerin Crea   nystatin powder Commonly known as:  MYCOSTATIN/NYSTOP   traMADol 50 MG tablet Commonly known as:  ULTRAM     TAKE these medications   acetaminophen 325 MG tablet  Commonly known as:  TYLENOL Take 2 tablets (650 mg total) by mouth every 6 (six) hours as needed for mild pain, fever or headache (or Fever >/= 101). What changed:  Another medication with the same name was added. Make sure you understand how and when to take  each.   acetaminophen 325 MG tablet Commonly known as:  TYLENOL Take 1-2 tablets (325-650 mg total) by mouth every 6 (six) hours as needed for mild pain (pain score 1-3 or temp > 100.5). What changed:  You were already taking a medication with the same name, and this prescription was added. Make sure you understand how and when to take each.   amLODipine 5 MG tablet Commonly known as:  NORVASC Take 1 tablet (5 mg total) by mouth daily.   aspirin EC 81 MG tablet Take 81 mg by mouth at bedtime.   atorvastatin 40 MG tablet Commonly known as:  LIPITOR TAKE 1 TABLET BY MOUTH ONCE DAILY   DESCOVY 200-25 MG tablet Generic drug:  emtricitabine-tenofovir AF Take 1 tablet by mouth at bedtime.   docusate sodium 100 MG capsule Commonly known as:  COLACE Take 1 capsule (100 mg total) by mouth daily as needed for mild constipation.   fenofibrate 160 MG tablet Take 0.5 tablets (80 mg total) by mouth at bedtime.   ferrous gluconate 324 MG tablet Commonly known as:  FERGON Take 1 tablet (324 mg total) by mouth daily with breakfast. Start taking on:  01/26/2018 What changed:    medication strength  how much to take  when to take this   hydrochlorothiazide 25 MG tablet Commonly known as:  HYDRODIURIL Take 1 tablet (25 mg total) by mouth daily. What changed:  when to take this   insulin aspart 100 UNIT/ML injection Commonly known as:  novoLOG Inject 4 Units into the skin 3 (three) times daily with meals.   LANTUS SOLOSTAR 100 UNIT/ML Solostar Pen Generic drug:  Insulin Glargine Inject 55 Units into the skin at bedtime.   methocarbamol 500 MG tablet Commonly known as:  ROBAXIN Take 1 tablet (500 mg total) by mouth every 8 (eight) hours as needed for muscle spasms.   metoprolol tartrate 100 MG tablet Commonly known as:  LOPRESSOR Take 100 mg by mouth 2 (two) times daily.   nutrition supplement (JUVEN) Pack Take 1 packet by mouth 2 (two) times daily between meals. Start taking  on:  01/26/2018   oxyCODONE 5 MG immediate release tablet Commonly known as:  Oxy IR/ROXICODONE Take 1-2 tablets (5-10 mg total) by mouth every 4 (four) hours as needed for moderate pain.   polyethylene glycol packet Commonly known as:  MIRALAX / GLYCOLAX Take 17 g by mouth daily as needed for mild constipation.   sertraline 50 MG tablet Commonly known as:  ZOLOFT Take 75 mg by mouth at bedtime.   sertraline 100 MG tablet Commonly known as:  ZOLOFT Take 100 mg by mouth every morning.   TIVICAY 50 MG tablet Generic drug:  dolutegravir Take 50 mg by mouth daily.   TRULICITY 1.5 ZO/1.0RU Sopn Generic drug:  Dulaglutide INJECT 1 PEN (1.5MG ) SUBCUTANEOUSLY ONCE A WEEK ON SUNDAY      Follow-up Information    [name], MD In 1 week.   Specialty:  Orthopedic Surgery Contact information: [place] [address] [phone]        MD at SNF in 1 week Follow up.          No Known  Allergies    Procedures/Studies: Dg Chest Port 1 View  Result Date: 01/18/2018 CLINICAL DATA:  RIGHT foot gangrene. EXAM: PORTABLE CHEST 1 VIEW COMPARISON:  None. FINDINGS: Cardiac silhouette is mildly enlarged. Mediastinal silhouette is not suspicious. Prominent RIGHT pleural fat. No pleural effusion or focal consolidation. No pneumothorax. Soft tissue planes and included osseous structures are non suspicious. IMPRESSION: Mild cardiomegaly.  No acute pulmonary process. Electronically Signed   By: Elon Alas M.D.   On: 01/18/2018 18:45   Dg Foot Complete Right  Result Date: 01/18/2018 CLINICAL DATA:  RIGHT foot gangrene.  Potential abscess. EXAM: RIGHT FOOT COMPLETE - 3+ VIEW COMPARISON:  None. FINDINGS: There is no evidence of fracture or dislocation. There is no evidence of arthropathy or other focal bone abnormality. Soft tissue swelling with subcutaneous gas mid and forefoot. Advanced vascular calcifications. IMPRESSION: Mid and forefoot subcutaneous gas most  consistent with necrotizing fasciitis. Advanced vascular calcifications. No radiographic evidence of osteomyelitis. Acute findings discussed with and reconfirmed by Dr.MELANIE BELFI on 01/18/2018 at 6:47 pm. Electronically Signed   By: Elon Alas M.D.   On: 01/18/2018 18:47      Subjective: Denies pain in his foot.  CBG elevated.  Discharge Exam: Vitals:   01/24/18 2125 01/25/18 0350  BP: (!) 161/89 (!) 151/89  Pulse: 70 63  Resp: 20 20  Temp: 98.2 F (36.8 C) 98.2 F (36.8 C)  SpO2: 96% 99%   Vitals:   01/24/18 1400 01/24/18 1733 01/24/18 2125 01/25/18 0350  BP:  (!) 153/76 (!) 161/89 (!) 151/89  Pulse:   70 63  Resp: (!) 26 (!) 28 20 20   Temp:  98.6 F (37 C) 98.2 F (36.8 C) 98.2 F (36.8 C)  TempSrc:  Oral Oral Oral  SpO2: (!) 87%  96% 99%  Weight:      Height:        General: Not in distress HEENT: Moist mucosa, supple neck Chest: Clear bilaterally CVS: Normal S1 and S2, no murmurs GI: Soft, nondistended, nontender Musculoskeletal: transTibial amputation site is clean with wound VAC in place    The results of significant diagnostics from this hospitalization (including imaging, microbiology, ancillary and laboratory) are listed below for reference.     Microbiology: Recent Results (from the past 240 hour(s))  Blood Culture (routine x 2)     Status: None   Collection Time: 01/18/18  7:23 PM  Result Value Ref Range Status   Specimen Description BLOOD LEFT ANTECUBITAL  Final   Special Requests   Final    BOTTLES DRAWN AEROBIC AND ANAEROBIC Blood Culture adequate volume   Culture   Final    NO GROWTH 5 DAYS Performed at [facility], [address], [place], [place] [address]    Report Status 01/23/2018 FINAL  Final  Blood Culture (routine x 2)     Status: None   Collection Time: 01/18/18  7:23 PM  Result Value Ref Range Status   Specimen Description BLOOD LEFT HAND  Final   Special Requests   Final    BOTTLES DRAWN AEROBIC AND ANAEROBIC  Blood Culture adequate volume   Culture   Final    NO GROWTH 5 DAYS Performed at [facility], [place] [address] [place], [place] [address]    Report Status 01/23/2018 FINAL  Final  MRSA PCR Screening     Status: None   Collection Time: 01/19/18  1:02 PM  Result Value Ref Range Status   MRSA by PCR NEGATIVE  NEGATIVE Final    Comment:        The GeneXpert MRSA Assay (FDA approved for NASAL specimens only), is one component of a comprehensive MRSA colonization surveillance program. It is not intended to diagnose MRSA infection nor to guide or monitor treatment for MRSA infections. Performed at [facility], [place] [address] [place], [place] [address]      Labs: BNP (last 3 results) No results for input(s): BNP in the last 8760 hours. Basic Metabolic Panel: Recent Labs  Lab 01/20/18 0813 01/20/18 0831 01/21/18 0411 01/22/18 0345 01/23/18 0157 01/25/18 0341  NA  --  138 137 135 138 139  K  --  3.3* 3.4* 3.6 3.5 3.8  CL  --  105 108 108 107 109  CO2  --  24 23 23 24 26   GLUCOSE  --  93 170* 171* 269* 246*  BUN  --  22* 20 14 16 17   CREATININE  --  1.56* 1.50* 1.41* 1.37* 1.24  CALCIUM  --  7.8* 7.3* 7.4* 7.5* 7.5*  MG 2.0  --   --   --   --  2.1  PHOS  --   --   --   --   --  2.0*  2.1*   Liver Function Tests: Recent Labs  Lab 01/18/18 1756 01/19/18 0616 01/25/18 0341  AST 33 28  --   ALT 18 17  --   ALKPHOS 122 111  --   BILITOT 0.9 0.8  --   PROT 8.4* 6.4*  --   ALBUMIN 2.2* 1.7* 1.7*   No results for input(s): LIPASE, AMYLASE in the last 168 hours. No results for input(s): AMMONIA in the last 168 hours. CBC: Recent Labs  Lab 01/18/18 1756 01/19/18 0616 01/20/18 0831 01/21/18 0411 01/22/18 0345 01/23/18 0157  WBC 31.3* 26.8* 24.6* 21.7* 21.2* 14.6*  NEUTROABS 29.8* 24.4*  --   --   --   --   HGB 11.4* 9.8* 9.3* 9.3* 9.0* 8.9*  HCT 33.3* 28.3* 27.5* 27.9* 26.8* 27.4*  MCV 89.3 89.3 89.6 90.9 90.5 92.3  PLT 439* 353 357 346 355 366    Cardiac Enzymes: No results for input(s): CKTOTAL, CKMB, CKMBINDEX, TROPONINI in the last 168 hours. BNP: Invalid input(s): POCBNP CBG: Recent Labs  Lab 01/24/18 1139 01/24/18 1655 01/24/18 2123 01/25/18 0627 01/25/18 1118  GLUCAP 204* 281* 356* 235* 248*   D-Dimer No results for input(s): DDIMER in the last 72 hours. Hgb A1c No results for input(s): HGBA1C in the last 72 hours. Lipid Profile No results for input(s): CHOL, HDL, LDLCALC, TRIG, CHOLHDL, LDLDIRECT in the last 72 hours. Thyroid function studies No results for input(s): TSH, T4TOTAL, T3FREE, THYROIDAB in the last 72 hours.  Invalid input(s): FREET3 Anemia work up No results for input(s): VITAMINB12, FOLATE, FERRITIN, TIBC, IRON, RETICCTPCT in the last 72 hours. Urinalysis    Component Value Date/Time   COLORURINE AMBER (A) 01/18/2018 2200   APPEARANCEUR CLOUDY (A) 01/18/2018 2200   LABSPEC 1.021 01/18/2018 2200   PHURINE 5.0 01/18/2018 2200   GLUCOSEU 50 (A) 01/18/2018 2200   HGBUR MODERATE (A) 01/18/2018 2200   BILIRUBINUR NEGATIVE 01/18/2018 2200   KETONESUR NEGATIVE 01/18/2018 2200   PROTEINUR 100 (A) 01/18/2018 2200   NITRITE NEGATIVE 01/18/2018 2200   LEUKOCYTESUR NEGATIVE 01/18/2018 2200   Sepsis Labs Invalid input(s): PROCALCITONIN,  WBC,  LACTICIDVEN Microbiology Recent Results (from the past 240 hour(s))  Blood Culture (routine x 2)  Status: None   Collection Time: 01/18/18  7:23 PM  Result Value Ref Range Status   Specimen Description BLOOD LEFT ANTECUBITAL  Final   Special Requests   Final    BOTTLES DRAWN AEROBIC AND ANAEROBIC Blood Culture adequate volume   Culture   Final    NO GROWTH 5 DAYS Performed at [facility], [address] [place], [place] [address]    Report Status 01/23/2018 FINAL  Final  Blood Culture (routine x 2)     Status: None   Collection Time: 01/18/18  7:23 PM  Result Value Ref Range Status   Specimen Description BLOOD LEFT HAND  Final   Special  Requests   Final    BOTTLES DRAWN AEROBIC AND ANAEROBIC Blood Culture adequate volume   Culture   Final    NO GROWTH 5 DAYS Performed at [facility], [place] [address] [place], [place] [address]    Report Status 01/23/2018 FINAL  Final  MRSA PCR Screening     Status: None   Collection Time: 01/19/18  1:02 PM  Result Value Ref Range Status   MRSA by PCR NEGATIVE NEGATIVE Final    Comment:        The GeneXpert MRSA Assay (FDA approved for NASAL specimens only), is one component of a comprehensive MRSA colonization surveillance program. It is not intended to diagnose MRSA infection nor to guide or monitor treatment for MRSA infections. Performed at [facility], [place] [address] [place], [place] [address]      Time coordinating discharge: Over 30 minutes  SIGNED:   Louellen Molder, MD  [facility] 01/25/2018, 4:18 PM Pager   If 7PM-7AM, please contact night-coverage [email or web address] Password [email or web address]

## 2018-02-04 DIAGNOSIS — F324 Major depressive disorder, single episode, in partial remission: Secondary | ICD-10-CM | POA: Diagnosis not present

## 2018-02-04 DIAGNOSIS — F419 Anxiety disorder, unspecified: Secondary | ICD-10-CM | POA: Diagnosis not present

## 2018-02-05 ENCOUNTER — Encounter (INDEPENDENT_AMBULATORY_CARE_PROVIDER_SITE_OTHER): Payer: Self-pay | Admitting: Orthopedic Surgery

## 2018-02-05 ENCOUNTER — Ambulatory Visit (INDEPENDENT_AMBULATORY_CARE_PROVIDER_SITE_OTHER): Payer: PPO | Admitting: Orthopedic Surgery

## 2018-02-05 VITALS — Ht 71.0 in | Wt 245.0 lb

## 2018-02-05 DIAGNOSIS — Z89511 Acquired absence of right leg below knee: Secondary | ICD-10-CM

## 2018-02-05 NOTE — Progress Notes (Signed)
Office Visit Note   Patient: Francisco Hutchinson           Date of Birth: May 12, 1971           MRN: [id number] Visit Date: 02/05/2018              Requested by: [name], MD [address] [place], [place] [address] PCP: [name], MD  Chief Complaint  Patient presents with  . Right Leg - Routine Post Op    01/22/18 right below the knee amputation       HPI: Patient is a 47 year old gentleman who is 2 weeks status post right transtibial amputation he has had a wound VAC on for 2 weeks he presents the wound VAC is turned off.  Assessment & Plan: Visit Diagnoses:  1. S/P unilateral BKA (below knee amputation), right ([place])     Plan: Sutures and staples removed he is given a prescription for biotech for a stump shrinker and a K3 level prosthesis.  Follow-Up Instructions: Return in about 3 weeks (around 02/26/2018).   Ortho Exam  Patient is alert, oriented, no adenopathy, well-dressed, normal affect, normal respiratory effort. Examination the incision has healed nicely there is no swelling there is no gaping of the incision no drainage no cellulitis no wound dehiscence.  Patient has full extension of his knee.  Imaging: No results found. No images are attached to the encounter.  Labs: Lab Results  Component Value Date   HGBA1C 11.3 (A) 01/05/2018   HGBA1C 10.8 09/24/2017   HGBA1C 10.9 (H) 03/23/2017   ESRSEDRATE 77 (H) 04/23/2017   CRP 2.5 04/23/2017   REPTSTATUS 01/23/2018 FINAL 01/18/2018   REPTSTATUS 01/23/2018 FINAL 01/18/2018   GRAMSTAIN  03/23/2017    ABUNDANT WBC PRESENT, PREDOMINANTLY PMN FEW GRAM NEGATIVE RODS MODERATE GRAM POSITIVE COCCI IN PAIRS    CULT  01/18/2018    NO GROWTH 5 DAYS Performed at [facility], [place] [address] [place], [place] [address]    CULT  01/18/2018    NO GROWTH 5 DAYS Performed at [facility] [address], [place], [place] [address]    LABORGA METHICILLIN RESISTANT STAPHYLOCOCCUS AUREUS 03/23/2017   LABORGA PROTEUS MIRABILIS 03/23/2017   LABORGA VIRIDANS STREPTOCOCCUS 03/23/2017     Lab Results  Component Value Date   ALBUMIN 1.7 (L) 01/25/2018   ALBUMIN 1.7 (L) 01/19/2018   ALBUMIN 2.2 (L) 01/18/2018    Body mass index is 34.17 kg/m.  Orders:  No orders of the defined types were placed in this encounter.  No orders of the defined types were placed in this encounter.    Procedures: No procedures performed  Clinical Data: No additional findings.  ROS:  All other systems negative, except as noted in the HPI. Review of Systems  Objective: Vital Signs: Ht 5\' 11"  (1.803 m)   Wt 245 lb (111.1 kg)   BMI 34.17 kg/m   Specialty Comments:  No specialty comments available.  PMFS History: Patient Active Problem List   Diagnosis Date Noted  . S/P unilateral BKA (below knee amputation), right ([place]) 02/05/2018  . Gangrene of right foot ([place])   . Severe protein-calorie malnutrition ([place])   . Necrotizing fasciitis ([place]) 01/18/2018  . Depression, major, single episode, in partial remission ([place]) 07/30/2017  . Hyperlipidemia associated with type 2 diabetes mellitus ([place]) 06/24/2017  . Hypertension associated with diabetes ([place]) 06/24/2017  . Hidradenitis 05/29/2017  . Candidal intertrigo 05/29/2017  . Stroke ([place])   .  HIV disease ([place])   . CKD (chronic kidney disease), stage III ([place]) 03/25/2017  . Type 2 diabetes mellitus with diabetic chronic kidney disease ([place]) 03/25/2017   Past Medical History:  Diagnosis Date  . Allergy   . Anxiety and depression 05/29/2017  . Diabetes mellitus without complication ([place])   . HIV (human immunodeficiency virus infection) ([place])   . Hypertension   . Stroke [facility])     Family History  Problem Relation Age of Onset  . Arthritis Mother   . Hypertension Mother   . Alcohol abuse Father   . Cancer Father   . Diabetes Father   . Early death Father   . Hyperlipidemia Father   . Hypertension Father   . Alcohol abuse Sister   .  Heart attack Maternal Grandmother   . Heart disease Maternal Grandmother   . Hypertension Maternal Grandmother   . Cancer Maternal Grandfather        lung  . Cancer Paternal Grandmother   . Diabetes Paternal Grandmother     Past Surgical History:  Procedure Laterality Date  . ABDOMINAL AORTOGRAM W/LOWER EXTREMITY N/A 01/20/2018   Procedure: ABDOMINAL AORTOGRAM W/LOWER EXTREMITY;  Surgeon: Waynetta Sandy, MD;  Location: [facility];  Service: Cardiovascular;  Laterality: N/A;  . abscess removed from back and left armpit    . AMPUTATION Right 01/22/2018   Procedure: RIGHT BELOW KNEE AMPUTATION;  Surgeon: [name], MD;  Location: [facility];  Service: Orthopedics;  Laterality: Right;  . IRRIGATION AND DEBRIDEMENT BUTTOCKS Left 03/23/2017   Procedure: IRRIGATION AND DEBRIDEMENT LEFT BUTTOCK ABSCESS;  Surgeon: Excell Seltzer, MD;  [facility];  Service: General;  Laterality: Left;  . IRRIGATION AND DEBRIDEMENT BUTTOCKS N/A 03/25/2017   Procedure: IRRIGATION AND DEBRIDEMENT DRESSING CHANGE AND EXPLORATION OF PERINEAL WOUND; CYSTOSCOPY;  Surgeon: Excell Seltzer, MD;  [facility];  Service: General;  Laterality: N/A;  . PARS PLANA VITRECTOMY 27 GAUGE WITH 25 GAUGE PORT Left 08/31/2017  . Surgery Right Arm     Social History   Occupational History  . Not on file  Tobacco Use  . Smoking status: Never Smoker  . Smokeless tobacco: Never Used  Substance and Sexual Activity  . Alcohol use: Yes    Alcohol/week: 1.2 oz    Types: 1 Glasses of wine, 1 Cans of beer per week    Comment: rare  . Drug use: No  . Sexual activity: Not Currently

## 2018-02-18 ENCOUNTER — Other Ambulatory Visit: Payer: Self-pay | Admitting: *Deleted

## 2018-02-18 NOTE — Patient Outreach (Signed)
HIGH RISK HTA Patient, s/p RBKA and recovering at [facility] in [place]. Mr. [name], patient's partner states he is taking him home tomorrow. He is primary care giver and pt's aunt also resides with them and is caregiver when Mr. [name] is at work. It is best to call him on his mobile and if he doesn't pick up, please leave a message.  I have advised him of our alliance with HTA and that it would be desirable for him to be followed by [facility]. He agrees to this referral.  I am referring to one of our CCM and LCSW. I have advised that he will be contacted next week.  Eulah Pont. Myrtie Neither, MSN, [facility] Gerontological Nurse Practitioner [facility] Care Management [phone]

## 2018-02-18 NOTE — Addendum Note (Signed)
Addended by: Deloria Lair on: 02/18/2018 10:08 AM   Modules accepted: Orders

## 2018-02-22 ENCOUNTER — Telehealth: Payer: Self-pay | Admitting: Family Medicine

## 2018-02-22 ENCOUNTER — Other Ambulatory Visit: Payer: Self-pay

## 2018-02-22 NOTE — Telephone Encounter (Signed)
Verbal orders for home health given.

## 2018-02-22 NOTE — Telephone Encounter (Signed)
See note.  Copied from [place] [phone]. Topic: General - Other >> Feb 22, 2018 11:39 AM Keene Breath wrote: Reason for CRM: Anda Kraft with [facility] called to verify referral for home health care for the above patient.  She stated that they will be coming out on 02/23/18 for care.  CB# [phone].

## 2018-02-23 ENCOUNTER — Other Ambulatory Visit: Payer: Self-pay | Admitting: *Deleted

## 2018-02-23 ENCOUNTER — Other Ambulatory Visit: Payer: Self-pay

## 2018-02-23 NOTE — Patient Outreach (Signed)
Transition of care: 02/22/2018:  Placed call to patient. No answer. Called friend [name] and requested patient call me back. Mr. [name] ( partner) quite upset that I would not talk to him. I explained I needed patients permission first.  Patient called me back and reports that he is doing well since he was discharged from [facility]. Reports he is waiting for home health to come. Reports that he has not planned a follow up primary MD appointment but has an appointment with [name] next week.  Patient reports DM is not doing well. Reports latest A1c of greater than 11. Reports that he is taking all his medications as prescribed. Reports stump is well healed and he is anxious to get his prosthetic.    Offered home visit and patient has accepted.  PLAN: home visit for 03/02/2018.  Confirmed address. Patient granted permission to speak with partner Gwendlyn Deutscher. Patient requested I call Mr. [name] about home visit.   Placed call to Mr. Best who seems very overwhelmed. Reports no transportation to MD visits. Reports patient eats a half a cheesecake at one time. Reports no primary MD follow up because of lack of transportation.  Reviewed with Mr. [name] that [facility] social worker will assist with MD appointments in [place] due to lack of transportation. Reviewed importance of timely follow and review of insulin.   Tomasa Rand, RN, BSN, CEN [facility] [employer] [phone]

## 2018-02-23 NOTE — Patient Outreach (Signed)
[place] [facility]) Care Management  02/23/2018  [place] 08/15/1970 [id number]   CSW was able to make initial phone contact with Gwendlyn Deutscher, pt's caregiver/significant other. Identity of pt confirmed and CSW introduced self, role and reason for call.  [name] reports pt was released from SNF rehab on 02/19/2018; stating "he was kicked out". He indicates pt was admitted to SNF rehab after hospitalization with Right leg amputation. [name] reports that the pt had previously had 2 CVA's and has little mobility; requiring bed to chair transfers. Pt's aunt lives in the home and helps care for pt while [name] works [phone]- Friday,gone from 7:30am-6:30pm). They are awaiting HH therapies to begin- per SNF, [facility] has been arranged.   [name] voices a lot of concerns related to transporting pt to/from appointments; starting with Dr Sharol Given office visit planned for Monday, 03/01/2018.  They have used [facility] in the past but he was not pleased with the service and their limited days/times for transport to [place] providers. CSW has advised him that there may not be any other resource; he is voices interest in finding some financial assistance with "uplifting my Lucianne Lei" to make it wheelchair accessible.  CSW has offered to collaborate with SNF and [facility] RNCM and [facility] BSW to further assess and provide for their needs.  CSW advised [name] of plans to call and update him later this week.  Eduard Clos, MSW, [place] Worker  [place] [phone]

## 2018-02-23 NOTE — Patient Outreach (Signed)
[place] [facility]) Care Management  02/23/2018  [place] 1971/05/14 [id number]   CSW made contact with patient today by phone and confirmed his identity. Pt reports Gwendlyn Deutscher is his HCPOA .  CSW explained reason for call regarding transportation needs and he states he is able to go in a wheelchair [place] or vehicle (using a transfer board). His partner works full time and cannot provide ride to appointment next Monday at Dr Jess Barters office. Pt reports he can use his electric wheelchair and get out of house, down the ramp to get in the [place]. He also indicates he can maneuver and go to the appointment alone and/or his aunt may ride with them.    CSW has asked pt to arrange a PCP appointment and to expect a phone call from [employer], [place], [place], who will be assessing and assisting further with his transportation needs.   CSW called pt's HCPOA, [name], to update on above. His main concern seems to be that pt cannot use [facility] if he will have to "be away for 8 hours".  CSW advised of uncertainty regarding this and also that this may be the only resource available for them to use for ongoing transportation needs.   CSW has also spoken with Tomasa Rand, [place], and will be following along for further assessment of needs.   Eduard Clos, MSW, [place] Worker  [place] [phone]

## 2018-02-23 NOTE — Patient Outreach (Signed)
[facility]) Care Management  02/23/2018  Francisco Hutchinson [facility] 02-27-1971 [id number]   Social work referral for transportation resources received today.  BSW called [place] to inquire about transportation options from [place] to [place].  The Transportation Coordinator reported that transportation to [place] occurs on Tuesdays between 9:00 AM and 1:00 PM.  Per Coordinator, it is best to schedule appointments at least two weeks in advance due to the amount of patients that request transport to [place]. BSW called Francisco Hutchinson and communicated this information to him.  BSW mailed a brochure for [facility] to him with this information included.   BSW arranged transportation via My [employer] for appointment with [name] on 03/01/18 at 4:00 and appointment with Dr. [name] on 03/03/18 @ 10:20. BSW will follow up with Francisco Hutchinson next week to ensure receipt of [facility] brochure.   Ronn Melena, BSW Social Worker [phone]

## 2018-02-24 DIAGNOSIS — T879 Unspecified complications of amputation stump: Secondary | ICD-10-CM | POA: Diagnosis not present

## 2018-02-24 DIAGNOSIS — N183 Chronic kidney disease, stage 3 (moderate): Secondary | ICD-10-CM | POA: Diagnosis not present

## 2018-02-24 DIAGNOSIS — E1165 Type 2 diabetes mellitus with hyperglycemia: Secondary | ICD-10-CM | POA: Diagnosis not present

## 2018-03-01 ENCOUNTER — Encounter (INDEPENDENT_AMBULATORY_CARE_PROVIDER_SITE_OTHER): Payer: Self-pay | Admitting: Orthopedic Surgery

## 2018-03-01 ENCOUNTER — Ambulatory Visit (INDEPENDENT_AMBULATORY_CARE_PROVIDER_SITE_OTHER): Payer: PPO | Admitting: Orthopedic Surgery

## 2018-03-01 DIAGNOSIS — Z89511 Acquired absence of right leg below knee: Secondary | ICD-10-CM

## 2018-03-01 NOTE — Progress Notes (Signed)
Office Visit Note   Patient: Francisco Hutchinson           Date of Birth: 08/11/1971           MRN: [id number] Visit Date: 03/01/2018              Requested by: [name], MD [address] [place], [place] [address] PCP: [name], MD  Chief Complaint  Patient presents with  . Right Knee - Pain, Routine Post Op      HPI: Patient is a 47 year old gentleman status post right transtibial amputation he is approximately 5 weeks out.  Patient states that he has been measured for prosthesis and this will be ready for him August 5.  Patient denies any drainage.  Assessment & Plan: Visit Diagnoses:  1. S/P unilateral BKA (below knee amputation), right ([place])     Plan: Patient to be fit with his prosthesis.  4 staples were removed.  Continue with the stump shrinker.  Continue with Dial soap cleansing and knee extension exercises.  Follow-Up Instructions: Return in about 2 months (around 05/02/2018).   Ortho Exam  Patient is alert, oriented, no adenopathy, well-dressed, normal affect, normal respiratory effort. Patient has full extension of his knee there are 4 staples that are removed.  The incision is well approximated there is no cellulitis no drainage no signs of infection.  Imaging: No results found. No images are attached to the encounter.  Labs: Lab Results  Component Value Date   HGBA1C 11.3 (A) 01/05/2018   HGBA1C 10.8 09/24/2017   HGBA1C 10.9 (H) 03/23/2017   ESRSEDRATE 77 (H) 04/23/2017   CRP 2.5 04/23/2017   REPTSTATUS 01/23/2018 FINAL 01/18/2018   REPTSTATUS 01/23/2018 FINAL 01/18/2018   GRAMSTAIN  03/23/2017    ABUNDANT WBC PRESENT, PREDOMINANTLY PMN FEW GRAM NEGATIVE RODS MODERATE GRAM POSITIVE COCCI IN PAIRS    CULT  01/18/2018    NO GROWTH 5 DAYS Performed at [facility], [place] [address], [place], [place] [address]    CULT  01/18/2018    NO GROWTH 5 DAYS Performed at [facility] [address] [place], [place] [address]    LABORGA METHICILLIN RESISTANT STAPHYLOCOCCUS AUREUS 03/23/2017   LABORGA PROTEUS MIRABILIS 03/23/2017   LABORGA VIRIDANS STREPTOCOCCUS 03/23/2017     Lab Results  Component Value Date   ALBUMIN 1.7 (L) 01/25/2018   ALBUMIN 1.7 (L) 01/19/2018   ALBUMIN 2.2 (L) 01/18/2018    There is no height or weight on file to calculate BMI.  Orders:  No orders of the defined types were placed in this encounter.  No orders of the defined types were placed in this encounter.    Procedures: No procedures performed  Clinical Data: No additional findings.  ROS:  All other systems negative, except as noted in the HPI. Review of Systems  Objective: Vital Signs: There were no vitals taken for this visit.  Specialty Comments:  No specialty comments available.  PMFS History: Patient Active Problem List   Diagnosis Date Noted  . S/P unilateral BKA (below knee amputation), right ([place]) 02/05/2018  . Gangrene of right foot ([place])   . Severe protein-calorie malnutrition ([place])   . Necrotizing fasciitis ([place]) 01/18/2018  . Depression, major, single episode, in partial remission ([place]) 07/30/2017  . Hyperlipidemia associated with type 2 diabetes mellitus ([place]) 06/24/2017  . Hypertension associated with diabetes ([place]) 06/24/2017  . Hidradenitis 05/29/2017  . Candidal intertrigo 05/29/2017  . Stroke ([place])   .  HIV disease ([place])   . CKD (chronic kidney disease), stage III ([place]) 03/25/2017  . Type 2 diabetes mellitus with diabetic chronic kidney disease ([place]) 03/25/2017   Past Medical History:  Diagnosis Date  . Allergy   . Anxiety and depression 05/29/2017  . Diabetes mellitus without complication ([place])   . HIV (human immunodeficiency virus infection) ([place])   . Hypertension   . Stroke [facility])     Family History  Problem Relation Age of Onset  . Arthritis Mother   . Hypertension Mother   . Alcohol abuse Father   . Cancer Father   . Diabetes Father   . Early death Father   .  Hyperlipidemia Father   . Hypertension Father   . Alcohol abuse Sister   . Heart attack Maternal Grandmother   . Heart disease Maternal Grandmother   . Hypertension Maternal Grandmother   . Cancer Maternal Grandfather        lung  . Cancer Paternal Grandmother   . Diabetes Paternal Grandmother     Past Surgical History:  Procedure Laterality Date  . ABDOMINAL AORTOGRAM W/LOWER EXTREMITY N/A 01/20/2018   Procedure: ABDOMINAL AORTOGRAM W/LOWER EXTREMITY;  Surgeon: Waynetta Sandy, MD;  Location: [facility];  Service: Cardiovascular;  Laterality: N/A;  . abscess removed from back and left armpit    . AMPUTATION Right 01/22/2018   Procedure: RIGHT BELOW KNEE AMPUTATION;  Surgeon: [name], MD;  Location: [facility];  Service: Orthopedics;  Laterality: Right;  . IRRIGATION AND DEBRIDEMENT BUTTOCKS Left 03/23/2017   Procedure: IRRIGATION AND DEBRIDEMENT LEFT BUTTOCK ABSCESS;  Surgeon: Excell Seltzer, MD;  [facility];  Service: General;  Laterality: Left;  . IRRIGATION AND DEBRIDEMENT BUTTOCKS N/A 03/25/2017   Procedure: IRRIGATION AND DEBRIDEMENT DRESSING CHANGE AND EXPLORATION OF PERINEAL WOUND; CYSTOSCOPY;  Surgeon: Excell Seltzer, MD;  [facility];  Service: General;  Laterality: N/A;  . PARS PLANA VITRECTOMY 27 GAUGE WITH 25 GAUGE PORT Left 08/31/2017  . Surgery Right Arm     Social History   Occupational History  . Not on file  Tobacco Use  . Smoking status: Never Smoker  . Smokeless tobacco: Never Used  Substance and Sexual Activity  . Alcohol use: Yes    Alcohol/week: 1.2 oz    Types: 1 Glasses of wine, 1 Cans of beer per week    Comment: rare  . Drug use: No  . Sexual activity: Not Currently

## 2018-03-02 ENCOUNTER — Ambulatory Visit: Payer: Self-pay

## 2018-03-02 ENCOUNTER — Other Ambulatory Visit: Payer: Self-pay

## 2018-03-02 NOTE — Patient Outreach (Signed)
[facility] [facility]) Care Management  03/02/2018  [place] Tatar [facility] 10-09-1970 [id number]  Follow up call to Mr. Loeffelholz to ensure receipt of resources mailed last week.  Mr. Givler did not answer and his voicemail box was full.  BSW will make another attempt within four business days.   Ronn Melena, BSW Social Worker [phone]

## 2018-03-02 NOTE — Patient Outreach (Signed)
[facility]) Care Management   03/02/2018  [place] 1971-03-01 [id number]  Francisco Hutchinson is an 47 y.o. male  Subjective: Patient reports that he thinks he is doing good since he got home from rehab on July 12. Saw [name] yesterday and had staples removed.  Reports used transportation service provided by [facility]. Patient has follow up with Dr. [name] tomorrow. Patient reports that he is not able to get into a car without assistance due to amputation and weakness ( from stroke) of the left leg. He is unable to stand.   Patient reports that his A1c is high ( 11.3) Reports that he knows what to eat but chooses to eat pasta and carbs instead. Reports he is not able to exercise very well.  Reports that he takes his medications as prescribed. Patient reports that he only monitors his CBG once a day and does not record.  Reports interest in diet teaching.   Objective:  Lives in trailer with aunt and partner. Kitchen very cluttered. Sitting in motorized scooter. Able to navigate scooter well.  Today's Vitals   03/02/18 1341 03/02/18 1344  BP: 128/80   Pulse: 69   SpO2: 98%   Weight: 230 lb (104.3 kg)   Height: 1.778 m (5\' 10" )   PainSc:  0-No pain   Review of Systems  Constitutional: Negative.   HENT: Negative.   Eyes:       Wears glasses  Respiratory: Negative.   Cardiovascular: Negative.   Gastrointestinal: Negative.   Genitourinary: Negative.   Musculoskeletal:       Right lower leg amputation  Skin:       Dry skin   Neurological: Positive for weakness.       Reports left leg weakness since stoke in 2013.  Endo/Heme/Allergies: Negative.   Psychiatric/Behavioral: Negative.     Physical Exam  Constitutional: He is oriented to person, place, and time. He appears well-developed and well-nourished.  Cardiovascular: Normal rate and normal heart sounds.  Respiratory: Effort normal and breath sounds normal.  GI: Soft. Bowel sounds are normal.   Musculoskeletal: Normal range of motion. He exhibits no edema.  Neurological: He is alert and oriented to person, place, and time.  2019, [place], Tuesday, July  Skin: Skin is warm and dry.  Open area to the bottom of the left foot between toe 3 and 4.  Left 2nd toenail is black.   Dry skin noted to the left lower leg.  Right stump below the knee with 4 open areas of a scab.   Psychiatric: He has a normal mood and affect. His behavior is normal. Judgment and thought content normal.    Encounter Medications:   Outpatient Encounter Medications as of 03/02/2018  Medication Sig Note  . acetaminophen (TYLENOL) 325 MG tablet Take 2 tablets (650 mg total) by mouth every 6 (six) hours as needed for mild pain, fever or headache (or Fever >/= 101).   Marland Kitchen acetaminophen (TYLENOL) 325 MG tablet Take 1-2 tablets (325-650 mg total) by mouth every 6 (six) hours as needed for mild pain (pain score 1-3 or temp > 100.5).   Marland Kitchen amLODipine (NORVASC) 5 MG tablet Take 1 tablet (5 mg total) by mouth daily.   Marland Kitchen aspirin EC 81 MG tablet Take 81 mg by mouth at bedtime.   Marland Kitchen atorvastatin (LIPITOR) 40 MG tablet TAKE 1 TABLET BY MOUTH ONCE DAILY   . DESCOVY 200-25 MG tablet Take 1 tablet by mouth at bedtime.    Marland Kitchen  fenofibrate 160 MG tablet Take 0.5 tablets (80 mg total) by mouth at bedtime. 01/18/2018: This tablet MUST be cut in half, then halved again (patient doesn't swallow tablets very well)  . ferrous gluconate (FERGON) 324 MG tablet Take 1 tablet (324 mg total) by mouth daily with breakfast.   . hydrochlorothiazide (HYDRODIURIL) 25 MG tablet Take 1 tablet (25 mg total) by mouth daily. (Patient taking differently: Take 25 mg by mouth every other day. )   . LANTUS SOLOSTAR 100 UNIT/ML Solostar Pen Inject 55 Units into the skin at bedtime.   . methocarbamol (ROBAXIN) 500 MG tablet Take 1 tablet (500 mg total) by mouth every 8 (eight) hours as needed for muscle spasms.   . metoprolol tartrate (LOPRESSOR) 100 MG tablet Take 100 mg  by mouth 2 (two) times daily.    . sertraline (ZOLOFT) 100 MG tablet Take 100 mg by mouth every morning. 01/18/2018: Patient takes 2 strengths of this med  . sertraline (ZOLOFT) 50 MG tablet Take 75 mg by mouth at bedtime. 01/18/2018: Patient takes 2 strengths of this med  . TIVICAY 50 MG tablet Take 50 mg by mouth daily.    . TRULICITY 1.5 GU/4.4IH SOPN  INJECT 1 PEN (1.5MG ) SUBCUTANEOUSLY ONCE A WEEK ON SUNDAY 01/18/2018: Regimen confirmed to be accurate by the patient's significant other  . docusate sodium (COLACE) 100 MG capsule Take 1 capsule (100 mg total) by mouth daily as needed for mild constipation. (Patient not taking: Reported on 03/02/2018)   . insulin aspart (NOVOLOG) 100 UNIT/ML injection Inject 4 Units into the skin 3 (three) times daily with meals. (Patient not taking: Reported on 03/02/2018)   . nutrition supplement, JUVEN, (JUVEN) PACK Take 1 packet by mouth 2 (two) times daily between meals. (Patient not taking: Reported on 03/02/2018)   . oxyCODONE (OXY IR/ROXICODONE) 5 MG immediate release tablet Take 1-2 tablets (5-10 mg total) by mouth every 4 (four) hours as needed for moderate pain. (Patient not taking: Reported on 03/02/2018)   . polyethylene glycol (MIRALAX / GLYCOLAX) packet Take 17 g by mouth daily as needed for mild constipation. (Patient not taking: Reported on 03/02/2018)    No facility-administered encounter medications on file as of 03/02/2018.     Functional Status:   In your present state of health, do you have any difficulty performing the following activities: 03/02/2018 01/19/2018  Hearing? N N  Vision? N N  Difficulty concentrating or making decisions? Y N  Walking or climbing stairs? Y Y  Dressing or bathing? Y N  Doing errands, shopping? [name]  Preparing Food and eating ? N -  Using the Toilet? N -  In the past six months, have you accidently leaked urine? N -  Do you have problems with loss of bowel control? N -  Managing your Medications? [place]  Manages medications -  Managing your Finances? N -  Housekeeping or managing your Housekeeping? N -  Some recent data might be hidden    Fall/Depression Screening:    Fall Risk  03/02/2018 04/23/2017  Falls in the past year? Yes Yes  Number falls in past yr: 2 or more 2 or more  Injury with Fall? No No  Risk Factor Category  High Fall Risk -  Risk for fall due to : Impaired balance/gait;Impaired mobility -   PHQ 2/9 Scores 03/02/2018 05/28/2017 04/23/2017  PHQ - 2 Score 0 0 0    Assessment:   (1) reviewed [facility] transition of care  program. Provided a new patient packet. Provided 24 hour nurse line magnet, [facility] calendar and my contact card.  (2) recent amputation:  Using motorized scooter and doing well. Stapes removed from stump. Stump with scabs noted. No redness or drainage.  (3) open area to the left bottom of foot between 3rd and 4th toe. Bloody drainage noted. Skin very dry to the left leg. (4) no advanced directives (5) follow up appointment with primary MD on 03/03/2018.   Transportation arranged by [facility]. (6) A1c greater than 11.  Not following diet. No exercise, reports taking medications as prescribed.  Reports knowing what to eat but chooses not to eat the correct foods.  Plan:  (1) consent obtained and scanned into chart. (2) reviewed fall precautions. Reviewed MD recommendation in Epic from [name] and reviewed these with patient. Encouraged patient to do continue to do exercises per PT from [place]. (3) This note sent to MD. Showed open area to patient and aunt by using a mirror. Reviewed concern of open area to bottom of foot. Reviewed signs of infection. Encouraged patient to discuss with primary MD at office visit. (4)Provided advanced directive packet and reviewed with patient how to complete. (5) Encouraged patient to discuss concerns with MD. Encouraged patient to make appointments in the future when [facility] can take patient. Provided [facility] phone number and address  with patient. (6) Provided education on DM. Provided living well with DM, Carb tear off page, Card counting booking and samples of men diets from [facility]. Reviewed goals with patient to monitor CBG daily and record. Reviewed and encouraged patient to follow DM diet.    Care planning and goal setting during home visit and primary goal is to decrease A1c to 8 in the next 90 days.  This note and barrier letter sent to MD.    [facility] CM Care Plan Problem One     Most Recent Value  Care Plan Problem One  recent amputation   Role Documenting the Problem One  Care Management [place] for Problem One  Active  [facility] Long Term Goal   Patient will report decreased in Hgb A1c  to 8 in the next days,.  [facility] Long Term Goal Start Date  03/02/18 Barrie Folk revised after home visit. ]  Interventions for Problem One Long Term Goal  Home visit completed. DM education provided. Encouraged patient to monitor and record CBG daily.   [facility] CM Short Term Goal #1   Patient will report attending MD appoitnment as suggested in the next 2 weeks.   [facility] CM Short Term Goal #1 Start Date  02/22/18  Interventions for Short Term Goal #1  Reviewed pending appointments and reviewed and confrimed transportation.   [facility] CM Short Term Goal #2   Patient will report no falls in the next 30 days.  [facility] CM Short Term Goal #2 Start Date  02/22/18  Interventions for Short Term Goal #2  Reviewed fall precautions . Encouraged patient to do home exercises and work with PT on transfers.     [facility] CM Care Plan Problem Two     Most Recent Value  Care Plan Problem Two  Alteration in skin intergirty related to sore on bottom of left foot.  Role Documenting the Problem Two  Care Management Coordinator  Care Plan for Problem Two  Active  [facility] CM Short Term Goal #1   Patient will report healed sore on the bottom of the left foot in the next 30  days.   [facility] CM Short Term Goal #1 Start Date  03/03/18  Interventions for Short Term Goal #2    Showed patient the sore on the bottom of his foot. Reviewed need for daily inspection. Reviewed signs of infections and when to call MD. Encouraged patient to keep clean and dry. This note sent to MD.  Reviewed with patient to discuss with MD at office visit on 03/03/2018     Tomasa Rand, RN, BSN, [place] Coordinator [phone]

## 2018-03-03 ENCOUNTER — Other Ambulatory Visit: Payer: Self-pay | Admitting: *Deleted

## 2018-03-03 ENCOUNTER — Ambulatory Visit: Payer: PPO | Admitting: Family Medicine

## 2018-03-03 ENCOUNTER — Telehealth: Payer: Self-pay

## 2018-03-03 NOTE — Telephone Encounter (Signed)
Patient was scheduled for appointment today, 03/03/2018, and rescheduled his appointment for 03/09/2018.  Attempted to contact patient to encourage patient to follow up with Dr. [name] sooner due to new open wound on bottom of left foot.  Patient did not answer phone and voicemail was full.  Will attempt to contact again, but will be out of the office after 1:00 today until Tuesday, 03/09/2018.

## 2018-03-04 ENCOUNTER — Other Ambulatory Visit: Payer: Self-pay

## 2018-03-04 ENCOUNTER — Ambulatory Visit: Payer: Self-pay

## 2018-03-04 NOTE — Patient Outreach (Signed)
[place] [facility]) Care Management  03/04/2018  Francisco Hutchinson [facility] Sep 06, 1970 [id number]   Follow up call to [name] regarding social work referral for transportation resources.  BSW reminded [name] that [facility] can provide transportation from [facility] to [facility]  on Tuesdays between 9:00 AM and 1:00 PM and that it is best to schedule this two weeks in advance.  [name] reported that his appointment with Dr. [name] was rescheduled to 04/09/18 @ 4:00 PM.  BSW arranged transportation for this appointment via [facility] due to it not being in the required timeframe to schedule with [facility].  BSW is closing at this time due to no other social work needs being identified but can reopen if other needs arise.  Ronn Melena, BSW Social Worker [phone]

## 2018-03-05 ENCOUNTER — Ambulatory Visit: Payer: Self-pay

## 2018-03-05 DIAGNOSIS — N183 Chronic kidney disease, stage 3 (moderate): Secondary | ICD-10-CM | POA: Diagnosis not present

## 2018-03-05 DIAGNOSIS — E1122 Type 2 diabetes mellitus with diabetic chronic kidney disease: Secondary | ICD-10-CM | POA: Diagnosis not present

## 2018-03-05 DIAGNOSIS — Z4781 Encounter for orthopedic aftercare following surgical amputation: Secondary | ICD-10-CM | POA: Diagnosis not present

## 2018-03-05 DIAGNOSIS — I1310 Hypertensive heart and chronic kidney disease without heart failure, with stage 1 through stage 4 chronic kidney disease, or unspecified chronic kidney disease: Secondary | ICD-10-CM | POA: Diagnosis not present

## 2018-03-05 DIAGNOSIS — E1151 Type 2 diabetes mellitus with diabetic peripheral angiopathy without gangrene: Secondary | ICD-10-CM | POA: Diagnosis not present

## 2018-03-09 ENCOUNTER — Other Ambulatory Visit: Payer: Self-pay | Admitting: *Deleted

## 2018-03-09 ENCOUNTER — Ambulatory Visit: Payer: Self-pay

## 2018-03-09 ENCOUNTER — Ambulatory Visit: Payer: PPO | Admitting: Family Medicine

## 2018-03-09 NOTE — Patient Outreach (Signed)
[place] [facility]) Care Management  03/09/2018  Keymari Sato [facility] June 11, 1971 [id number]  Transition of care   Covering for assigned care coordinator.  Patient is s/p Right BKA, recent discharge from [facility].   Placed call to patient mobile number listed, no answer able to leave a HIPAA compliant message for return call.  Received return call from [facility], patient friend listed on [facility] consent, HIPAA information verified. Mr.Best reports patient preferred contact number provided is actually his number, he states patient does not answer phone unless he is notified by Mr.Best that someone is trying to contact him.  Mr.Best discussed that the patient is doing okay. He discussed patient stump site is healing very well, reports area on bottom of left foot is dry no drainage.  He reports that the patient has an follow up appointment with PCP on today and transportation has been arranged by [facility] social worker.  Mr.Best discussed that patient blood sugars have been running in the 80 to 200 range in the morning , he is unsure about the evening . He states he encourages patient to choose healthier options rather than snacks, but he has his own mind and when he is in his scooter he is able to back a cake if he wants it.  Mr.Best discussed concerns related to wanting to know about resources, grants, available to help with extending porch to make it a little safer for patient to get down ramp, home modifications to get rid of the tub and just have a shower. Discussed with caregiver that I am unsure of resources available for these modifications.   Placed call to patient , HIPAA verified, reports that he is feeling good on today, report his blood sugar was 94 on this am unable to recall reading from yesterday.  Patient able to state transportation to PCP visit today will arrive around 2 pm and that his is  ready and in his scooter.   Plan Will continue with weekly transition of care  calls, will update assigned care manager of visit today.  Will discuss with Social worker patient care giver concerns regarding community resource needs,after discussion with [employer], will place social work consult related to community resources needs.   Joylene Draft, RN, [place] Management Coordinator  [phone]- Mobile [phone]- Toll Free Main Office

## 2018-03-10 ENCOUNTER — Other Ambulatory Visit: Payer: Self-pay

## 2018-03-10 NOTE — Patient Outreach (Signed)
Francisco Hutchinson) Care Management  03/10/2018  Francisco Hutchinson [facility] Oct 27, 1970 [id number]   BSW received call from patient and RNCM, Landis Martins, reporting that transportation had not arrived to take Francisco Hutchinson to his appointment with Dr. [name].  BSW called [facility]Nurse Care to inquire about this and they had the appointment scheduled for 8/30 instead of 7/30.  BSW called Francisco Hutchinson back to inform him of this.  Francisco Hutchinson rescheduled the appointment for Thursday, 03/11/18 @ 4:00 and transportation was arranged via [employer] Medical [employer].  Ronn Melena, BSW Social Worker [phone]

## 2018-03-11 ENCOUNTER — Ambulatory Visit (INDEPENDENT_AMBULATORY_CARE_PROVIDER_SITE_OTHER): Payer: PPO | Admitting: Family Medicine

## 2018-03-11 ENCOUNTER — Telehealth: Payer: Self-pay | Admitting: Family Medicine

## 2018-03-11 ENCOUNTER — Encounter: Payer: Self-pay | Admitting: Family Medicine

## 2018-03-11 VITALS — BP 124/78 | HR 101 | Temp 98.4°F | Ht 70.0 in

## 2018-03-11 DIAGNOSIS — R300 Dysuria: Secondary | ICD-10-CM

## 2018-03-11 DIAGNOSIS — Z89511 Acquired absence of right leg below knee: Secondary | ICD-10-CM

## 2018-03-11 DIAGNOSIS — R319 Hematuria, unspecified: Secondary | ICD-10-CM | POA: Diagnosis not present

## 2018-03-11 DIAGNOSIS — I152 Hypertension secondary to endocrine disorders: Secondary | ICD-10-CM

## 2018-03-11 DIAGNOSIS — E1122 Type 2 diabetes mellitus with diabetic chronic kidney disease: Secondary | ICD-10-CM

## 2018-03-11 DIAGNOSIS — N509 Disorder of male genital organs, unspecified: Secondary | ICD-10-CM | POA: Diagnosis not present

## 2018-03-11 DIAGNOSIS — E1169 Type 2 diabetes mellitus with other specified complication: Secondary | ICD-10-CM

## 2018-03-11 DIAGNOSIS — E1159 Type 2 diabetes mellitus with other circulatory complications: Secondary | ICD-10-CM

## 2018-03-11 DIAGNOSIS — E785 Hyperlipidemia, unspecified: Secondary | ICD-10-CM | POA: Diagnosis not present

## 2018-03-11 DIAGNOSIS — L97529 Non-pressure chronic ulcer of other part of left foot with unspecified severity: Secondary | ICD-10-CM

## 2018-03-11 DIAGNOSIS — B372 Candidiasis of skin and nail: Secondary | ICD-10-CM

## 2018-03-11 DIAGNOSIS — N183 Chronic kidney disease, stage 3 (moderate): Secondary | ICD-10-CM

## 2018-03-11 DIAGNOSIS — I10 Essential (primary) hypertension: Secondary | ICD-10-CM | POA: Diagnosis not present

## 2018-03-11 DIAGNOSIS — E11621 Type 2 diabetes mellitus with foot ulcer: Secondary | ICD-10-CM | POA: Diagnosis not present

## 2018-03-11 DIAGNOSIS — Z794 Long term (current) use of insulin: Secondary | ICD-10-CM

## 2018-03-11 DIAGNOSIS — I1310 Hypertensive heart and chronic kidney disease without heart failure, with stage 1 through stage 4 chronic kidney disease, or unspecified chronic kidney disease: Secondary | ICD-10-CM | POA: Diagnosis not present

## 2018-03-11 DIAGNOSIS — E1151 Type 2 diabetes mellitus with diabetic peripheral angiopathy without gangrene: Secondary | ICD-10-CM | POA: Diagnosis not present

## 2018-03-11 DIAGNOSIS — Z4781 Encounter for orthopedic aftercare following surgical amputation: Secondary | ICD-10-CM | POA: Diagnosis not present

## 2018-03-11 LAB — POCT URINALYSIS DIPSTICK
Bilirubin, UA: NEGATIVE
Glucose, UA: POSITIVE — AB
Ketones, UA: NEGATIVE
Leukocytes, UA: NEGATIVE
Nitrite, UA: NEGATIVE
Protein, UA: POSITIVE — AB
Spec Grav, UA: 1.01 (ref 1.010–1.025)
Urobilinogen, UA: 0.2 E.U./dL
pH, UA: 8.5 — AB (ref 5.0–8.0)

## 2018-03-11 MED ORDER — HYDROCHLOROTHIAZIDE 25 MG PO TABS: 25.0000 mg | ORAL_TABLET | Freq: Every day | ORAL | 1 refills | Status: DC

## 2018-03-11 MED ORDER — FREESTYLE LIBRE SENSOR SYSTEM MISC: 0 refills | Status: DC

## 2018-03-11 MED ORDER — FENOFIBRATE MICRONIZED 43 MG PO CAPS: 43.0000 mg | ORAL_CAPSULE | Freq: Every day | ORAL | 1 refills | Status: DC

## 2018-03-11 MED ORDER — DOXYCYCLINE HYCLATE 100 MG PO TABS: 100.0000 mg | ORAL_TABLET | Freq: Two times a day (BID) | ORAL | 0 refills | Status: DC

## 2018-03-11 MED ORDER — FLUCONAZOLE 150 MG PO TABS: 150.0000 mg | ORAL_TABLET | Freq: Every day | ORAL | 0 refills | Status: AC

## 2018-03-11 NOTE — Telephone Encounter (Signed)
I have spoken with Mr. Francisco Hutchinson and questions have been addressed.

## 2018-03-11 NOTE — Assessment & Plan Note (Signed)
Healing well.  Defer further management to orthopedics.

## 2018-03-11 NOTE — Progress Notes (Signed)
Subjective:  Francisco Hutchinson is a 47 y.o. male who presents today with a chief complaint of follow-up for right BKA and type 2 diabetes.   HPI:  Status post right BKA, new problem Patient seen in office about 2 months ago for acute onset right foot gangrene.  He was subsequently transported to the emergency department and admitted for sepsis.  He underwent right BKA at that time.  He has been followed by orthopedics and rehab.  Recently discharged from rehab.  Overall, feels like he is doing well.  Is able to get around the house normally.  Type 2 diabetes, chronic problem, uncontrolled Patient's last A1c was in the 11 range.  He is currently on Lantus 55 units daily and Trulicity 1.5 mg weekly.  He is compliant with these medications without reported side effects.  Patient reports that his sugars are usually in the 70s to 90s range.  Patient also reports he was on a different medication while in rehab but does not remember the name of this medication.  Patient request freestyle libre monitor to be sent for him today.  Hyperlipidemia, chronic problem, stable Currently on atorvastatin.  He has also on fenofibrate and requests an alternative formulation be sent in.  Dysuria, new problem Started a few days ago.  Actually improved over that time.  No obvious precipitating events.  No treatments tried.  No fevers or chills.  Scrotal wound, new problem Started several days ago.  Located along the posterior aspect of the scrotum.  He has had some bleeding and drainage to the area as well.  No specific treatments tried.  No fevers or chills.  Left foot wound, new problem Patient is also noticed wound between his toes on his left foot.  Denies any surrounding erythema or drainage.  No pain in the area.  ROS: Per HPI, otherwise a complete review of systems was negative.   PMH:  The following were reviewed and entered/updated in epic: Past Medical History:  Diagnosis Date  . Allergy   .  Anxiety and depression 05/29/2017  . Diabetes mellitus without complication ([place])   . HIV (human immunodeficiency virus infection) ([place])   . Hypertension   . Stroke [facility])    Patient Active Problem List   Diagnosis Date Noted  . Hematuria 03/11/2018  . Diabetic ulcer of left foot associated with type 2 diabetes mellitus ([place]) 03/11/2018  . S/P unilateral BKA (below knee amputation), right ([place]) 02/05/2018  . Severe protein-calorie malnutrition ([place])   . Depression, major, single episode, in partial remission ([place]) 07/30/2017  . Hyperlipidemia associated with type 2 diabetes mellitus ([place]) 06/24/2017  . Hypertension associated with diabetes ([place]) 06/24/2017  . Hidradenitis 05/29/2017  . Candidal intertrigo 05/29/2017  . Stroke ([place])   . HIV disease ([place])   . CKD (chronic kidney disease), stage III ([place]) 03/25/2017  . Type 2 diabetes mellitus with diabetic chronic kidney disease ([place]) 03/25/2017   Past Surgical History:  Procedure Laterality Date  . ABDOMINAL AORTOGRAM W/LOWER EXTREMITY N/A 01/20/2018   Procedure: ABDOMINAL AORTOGRAM W/LOWER EXTREMITY;  Surgeon: Waynetta Sandy, MD;  Location: [place] [facility];  Service: Cardiovascular;  Laterality: N/A;  . abscess removed from back and left armpit    . AMPUTATION Right 01/22/2018   Procedure: RIGHT BELOW KNEE AMPUTATION;  Surgeon: [name], MD;  Location: [place];  Service: Orthopedics;  Laterality: Right;  . IRRIGATION AND DEBRIDEMENT BUTTOCKS Left 03/23/2017   Procedure: IRRIGATION AND DEBRIDEMENT LEFT BUTTOCK ABSCESS;  Surgeon: Excell Seltzer, MD;  Location: [facility];  Service: General;  Laterality: Left;  . IRRIGATION AND DEBRIDEMENT BUTTOCKS N/A 03/25/2017   Procedure: IRRIGATION AND DEBRIDEMENT DRESSING CHANGE AND EXPLORATION OF PERINEAL WOUND; CYSTOSCOPY;  Surgeon: Excell Seltzer, MD;  Location: [facility];  Service: General;  Laterality: N/A;  . PARS PLANA VITRECTOMY 27 GAUGE WITH 25 GAUGE PORT Left 08/31/2017  .  Surgery Right Arm      Family History  Problem Relation Age of Onset  . Arthritis Mother   . Hypertension Mother   . Alcohol abuse Father   . Cancer Father   . Diabetes Father   . Early death Father   . Hyperlipidemia Father   . Hypertension Father   . Alcohol abuse Sister   . Heart attack Maternal Grandmother   . Heart disease Maternal Grandmother   . Hypertension Maternal Grandmother   . Cancer Maternal Grandfather        lung  . Cancer Paternal Grandmother   . Diabetes Paternal Grandmother     Medications- reviewed and updated Current Outpatient Medications  Medication Sig Dispense Refill  . acetaminophen (TYLENOL) 325 MG tablet Take 2 tablets (650 mg total) by mouth every 6 (six) hours as needed for mild pain, fever or headache (or Fever >/= 101).    Marland Kitchen acetaminophen (TYLENOL) 325 MG tablet Take 1-2 tablets (325-650 mg total) by mouth every 6 (six) hours as needed for mild pain (pain score 1-3 or temp > 100.5). 15 tablet 0  . amLODipine (NORVASC) 5 MG tablet Take 1 tablet (5 mg total) by mouth daily. 30 tablet 6  . aspirin EC 81 MG tablet Take 81 mg by mouth at bedtime.    Marland Kitchen atorvastatin (LIPITOR) 40 MG tablet TAKE 1 TABLET BY MOUTH ONCE DAILY 90 tablet 0  . DESCOVY 200-25 MG tablet Take 1 tablet by mouth at bedtime.   11  . docusate sodium (COLACE) 100 MG capsule Take 1 capsule (100 mg total) by mouth daily as needed for mild constipation. 10 capsule 0  . fenofibrate 160 MG tablet Take 0.5 tablets (80 mg total) by mouth at bedtime. 90 tablet 0  . ferrous gluconate (FERGON) 324 MG tablet Take 1 tablet (324 mg total) by mouth daily with breakfast. 30 tablet 0  . hydrochlorothiazide (HYDRODIURIL) 25 MG tablet Take 1 tablet (25 mg total) by mouth daily. 90 tablet 1  . insulin aspart (NOVOLOG) 100 UNIT/ML injection Inject 4 Units into the skin 3 (three) times daily with meals. 10 mL 11  . LANTUS SOLOSTAR 100 UNIT/ML Solostar Pen Inject 55 Units into the skin at bedtime. 45 mL 3    . methocarbamol (ROBAXIN) 500 MG tablet Take 1 tablet (500 mg total) by mouth every 8 (eight) hours as needed for muscle spasms. 15 tablet 0  . metoprolol tartrate (LOPRESSOR) 100 MG tablet Take 100 mg by mouth 2 (two) times daily.   0  . nutrition supplement, JUVEN, (JUVEN) PACK Take 1 packet by mouth 2 (two) times daily between meals. 30 packet 0  . polyethylene glycol (MIRALAX / GLYCOLAX) packet Take 17 g by mouth daily as needed for mild constipation. 14 each 0  . sertraline (ZOLOFT) 100 MG tablet Take 100 mg by mouth every morning.  11  . sertraline (ZOLOFT) 50 MG tablet Take 75 mg by mouth at bedtime.  0  . TIVICAY 50 MG tablet Take 50 mg by mouth daily.   11  . TRULICITY 1.5 HG/9.9ME SOPN  INJECT  1 PEN (1.5MG ) SUBCUTANEOUSLY ONCE A WEEK ON SUNDAY 4 pen 1  . Continuous Blood Gluc Sensor (FREESTYLE LIBRE SENSOR SYSTEM) MISC Use 4 times daily as needed to check blood sugar. 1 each 0  . doxycycline (VIBRA-TABS) 100 MG tablet Take 1 tablet (100 mg total) by mouth 2 (two) times daily. 14 tablet 0  . fenofibrate micronized (ANTARA) 43 MG capsule Take 1 capsule (43 mg total) by mouth daily before breakfast. 30 capsule 1  . fluconazole (DIFLUCAN) 150 MG tablet Take 1 tablet (150 mg total) by mouth daily for 7 days. 7 tablet 0   No current facility-administered medications for this visit.     Allergies-reviewed and updated No Known Allergies  Social History   Socioeconomic History  . Marital status: Single    Spouse name: Not on file  . Number of children: Not on file  . Years of education: Not on file  . Highest education level: Not on file  Occupational History  . Not on file  Social Needs  . Financial resource strain: Not on file  . Food insecurity:    Worry: Not on file    Inability: Not on file  . Transportation needs:    Medical: Not on file    Non-medical: Not on file  Tobacco Use  . Smoking status: Never Smoker  . Smokeless tobacco: Never Used  Substance and Sexual  Activity  . Alcohol use: Yes    Alcohol/week: 1.2 oz    Types: 1 Glasses of wine, 1 Cans of beer per week    Comment: rare  . Drug use: No  . Sexual activity: Not Currently  Lifestyle  . Physical activity:    Days per week: Not on file    Minutes per session: Not on file  . Stress: Not on file  Relationships  . Social connections:    Talks on phone: Not on file    Gets together: Not on file    Attends religious service: Not on file    Active member of club or organization: Not on file    Attends meetings of clubs or organizations: Not on file    Relationship status: Not on file  Other Topics Concern  . Not on file  Social History Narrative  . Not on file    Objective:  Physical Exam: BP 124/78 (BP Location: Left Arm, Patient Position: Sitting, Cuff Size: Normal)   Pulse (!) 101   Temp 98.4 F (36.9 C) (Oral)   Ht 5\' 10"  (1.778 m)   SpO2 94%   BMI 33.00 kg/m   Gen: NAD, resting comfortably CV: RRR with no murmurs appreciated Pulm: NWOB, CTAB with no crackles, wheezes, or rhonchi GI: Normal bowel sounds present. Soft, Nontender, Nondistended. GU: Posterior scrotum erythematous with central ulceration and purulent debris.  Erythema does not involve perineum.  Also with significant inguinal candidiasis. MSK: No edema, cyanosis, or clubbing noted.  Right lower extremity with BKA and is well healing.  Left lower extremity with several areas of skin breakdown and dry skin on feet.  No significant signs of infection. Skin: Warm, dry Neuro: Grossly normal, moves all extremities Psych: Normal affect and thought content  Results for orders placed or performed in visit on 03/11/18 (from the past 24 hour(s))  POCT urinalysis dipstick     Status: Abnormal   Collection Time: 03/11/18  4:54 PM  Result Value Ref Range   Color, UA Yellow    Clarity, UA Cloudy    Glucose,  UA Positive (A) Negative   Bilirubin, UA Negative    Ketones, UA Negative    Spec Grav, UA 1.010 1.010 - 1.025    Blood, UA Small (1+)    pH, UA 8.5 (A) 5.0 - 8.0   Protein, UA Positive (A) Negative   Urobilinogen, UA 0.2 0.2 or 1.0 E.U./dL   Nitrite, UA Negative    Leukocytes, UA Negative Negative   Appearance     Odor      Assessment/Plan:  Type 2 diabetes mellitus with diabetic chronic kidney disease (HCC) Very poorly controlled.  Patient has difficult time stating his current regimen.  Does not have a log of blood sugars with him either.  Given his reported sugars in the 70s to 90s range, will not make any changes today.  We will continue with his reported Lantus 55 units daily and Trulicity 1.5 mg weekly.  Advised patient to bring a log of blood sugars to his next appointment.  He will follow-up with me in about 1 month.  Stressed importance of good glycemic control to prevent recurrent infections and promote wound healing.  S/P unilateral BKA (below knee amputation), right ([place]) Healing well.  Defer further management to orthopedics.  Hypertension associated with diabetes ([place]) At goal.  Refilled HCTZ today.  Continue metoprolol.  Hyperlipidemia associated with type 2 diabetes mellitus ([place]) Send an alternative formulation for fenofibrate.  Continue current dose of atorvastatin.  Hematuria Noted on his UA.  Possibly consistent with UTI.  Will check urine culture to rule out.  Will need repeat UA in 4 to 6 weeks.  Candidal intertrigo Patient with significant candidiasis.  Given his poorly controlled diabetes, history of HIV, and recent bout of gangrene with sepsis, we will treat aggressively to prevent recurrent cellulitis.  Start Diflucan 150 mg daily for the next week.  Scrotal lesion Concern for cellulitis.  Does not have any signs of systemic infection or foreign years gangrene.  He also has severe candidal intertrigo which is also playing a role.  We will start a course of doxycycline to treat any underlying bacterial infection.  We will also start Diflucan as noted above.  Left foot  ulcer No signs of infection.  Advised patient to keep area clean and dry.  He also has home health nursing that we will continue to evaluate.  Discussed reasons to return to care.  Time Spent: I spent >40 minutes face-to-face with the patient, with more than half spent on counseling for management for his type 2 diabetes, hypertension, hyperlipidemia, possible UTI, intertrigo, scrotal lesion, and left foot ulcer.  Algis Greenhouse. [name], MD 03/11/2018 5:35 PM

## 2018-03-11 NOTE — Assessment & Plan Note (Signed)
Patient with significant candidiasis.  Given his poorly controlled diabetes, history of HIV, and recent bout of gangrene with sepsis, we will treat aggressively to prevent recurrent cellulitis.  Start Diflucan 150 mg daily for the next week.

## 2018-03-11 NOTE — Assessment & Plan Note (Signed)
Send an alternative formulation for fenofibrate.  Continue current dose of atorvastatin.

## 2018-03-11 NOTE — Assessment & Plan Note (Signed)
Noted on his UA.  Possibly consistent with UTI.  Will check urine culture to rule out.  Will need repeat UA in 4 to 6 weeks.

## 2018-03-11 NOTE — Telephone Encounter (Signed)
Copied from [place] [id number]. Topic: General - Other >> Mar 11, 2018 11:57 AM Keene Breath wrote: Reason for CRM: Gwendlyn Deutscher called to leave message for nurse to call when patient arrives for appt. Today at 4pm.  She has some questions to ask doctor while patient is there and would like a call back.  CB# [phone].

## 2018-03-11 NOTE — Assessment & Plan Note (Signed)
At goal.  Refilled HCTZ today.  Continue metoprolol.

## 2018-03-11 NOTE — Assessment & Plan Note (Signed)
Very poorly controlled.  Patient has difficult time stating his current regimen.  Does not have a log of blood sugars with him either.  Given his reported sugars in the 70s to 90s range, will not make any changes today.  We will continue with his reported Lantus 55 units daily and Trulicity 1.5 mg weekly.  Advised patient to bring a log of blood sugars to his next appointment.  He will follow-up with me in about 1 month.  Stressed importance of good glycemic control to prevent recurrent infections and promote wound healing.

## 2018-03-11 NOTE — Patient Instructions (Addendum)
It was very nice to see you today!  I will send in doxycycline for you. We will check a urine culture to make sure you dont have a UTI. I would also like for you to start diflucan which will help with your areas of skin breakdown.  Please keep a close eye on your foot and other areas of open skin and let me know if you start having any signs of infection.  I will send in a refill for your HCTZ and fenifibrate.  I do not think that we need to start novolog at this point due to your sugars being in the 70s-90s range. Please continue taking lantus 55 units daily along with the trulicity.   Come back to see me in 1 month to follow up on your blood sugars. Please bring a log of your blood sugars to your next appointment.   Please seek medical care if your symptoms worsen or do not improve over the next few days.   Take care, Dr [name]

## 2018-03-12 ENCOUNTER — Other Ambulatory Visit: Payer: Self-pay

## 2018-03-12 NOTE — Patient Outreach (Signed)
[facility]) Care Management  03/12/2018  Francisco Hutchinson [facility] [birth year] [id number]   Request received to arrange transportation to appointment at [place] on 03/15/18 @ 1:00.  Transportation arranged via [place], [place] Worker [phone]

## 2018-03-14 LAB — URINE CULTURE
MICRO NUMBER: 90910900
SPECIMEN QUALITY: ADEQUATE

## 2018-03-15 ENCOUNTER — Telehealth (INDEPENDENT_AMBULATORY_CARE_PROVIDER_SITE_OTHER): Payer: Self-pay | Admitting: Orthopedic Surgery

## 2018-03-15 NOTE — Telephone Encounter (Signed)
Patient significant other called wanted to inform Dr.Duda that the physical Therapy office, denied patients physical therapy, due to lack of information.

## 2018-03-16 ENCOUNTER — Other Ambulatory Visit: Payer: Self-pay

## 2018-03-16 ENCOUNTER — Telehealth: Payer: Self-pay

## 2018-03-16 MED ORDER — CEFIXIME 400 MG PO CAPS: 400.0000 mg | ORAL_CAPSULE | Freq: Every day | ORAL | 0 refills | Status: DC

## 2018-03-16 NOTE — Progress Notes (Signed)
Please inform patient of the following:  His urine culture confirms a UTI. His doxycycline will not adequately treat this. Please send in suprax 400mg  daily for 7 days.  He should let us know if his symptoms worsen or are not improving with the antibiotic.  Algis Greenhouse. [name], MD 03/16/2018 8:03 AM

## 2018-03-16 NOTE — Telephone Encounter (Signed)
PA for patient's antibiotic is in progress.  Waiting for insurance decision.

## 2018-03-16 NOTE — Telephone Encounter (Signed)
Pt is s/p a BKA and partner states that dues to the pt's history of stroke that physical therapy would not be beneficial to him. He is requesting therapy to help him stand, pivot and sit. Advised that he will call back tomorrow with the fax number and contact at the insurance company. I will write order and fax over information to approve this. Will hold message pending return call with information.

## 2018-03-16 NOTE — Patient Outreach (Signed)
Transition of care: Placed call to patient who's phone is not working. Placed call to Gwendlyn Deutscher ( partner)who answered and reports patient has a UTI, yeast infection and is on new antibiotics.. Reports wound to the left foot is healing. No signs of infection. Reports keeping a log of CBG an range is 80-110. Reports following a diet is hard for patient.   Francisco Hutchinson reports PT was discontinued due to non payment of insurance. Francisco Hutchinson reports he ha filed an appeal and MD is sending in additional documentation about necessity of PT. Patient went to [facility]otech yesterday to be measured for prosthesis.  PLAN: will continue weekly transition of care calls. Encouraged patient to call me sooner if needed.  Reviewed importance of DM control. Reviewed signs of infection and when to call md.  Tomasa Rand, RN, BSN, CEN [place] Coordinator [phone]

## 2018-03-16 NOTE — Telephone Encounter (Signed)
PA has been approved.  Patient's pharmacy notified.

## 2018-03-16 NOTE — Patient Outreach (Signed)
[facility]) Care Management  03/16/2018  Ardith Lewman [facility] [birth year] [id number]  BSW attempted to contact Mr. Bolls regarding social work referral for home modifications and DME. BSW was unable to reach Mr. Lowe so a call was placed to Gwendlyn Deutscher.   Mr. [name] reported that it would be helpful for the tub to be modified into a shower only but he acknowledged a lack of resources for this type of modification.  He is requesting a particular type of shower chair that should be easier for Mr. Schwer to use.  He also reported the need for a power wheelchair and possibly a new hospital bed.  BSW informed him that any DME has to be ordered by an MD first in order for insurance to cover a portion of the cost.   Mr. [name] also reported that there is the need for repairs to the deck outside that leads to the handicap ramp.  He agreed to [facility] making a referral to [facility]; referral was submitted.   BSW is mailing information on [place] and [place] as these agencies require that referral come directly from patient.  BSW will follow up next week to ensure receipt of resources mailed.   Ronn Melena, BSW Social Worker [phone]

## 2018-03-22 ENCOUNTER — Other Ambulatory Visit: Payer: Self-pay

## 2018-03-22 ENCOUNTER — Ambulatory Visit: Payer: PPO | Admitting: Podiatry

## 2018-03-22 NOTE — Patient Outreach (Signed)
Transition of care:  Placed call to patient who answered and reports that he is doing well. Reports CBG 61 and 49 in the last 2 days. Reports  No symptoms. States that he is following his diet and deceasing his insulin doses.  Reports not eating a bedtime snack.  Reports left foot is healing and right stump continues to have some drainage. No new problems or concerns. Continues to be active with home health PT and reports that he has 5 more visits. Reports that he is working on transfers. States that he was able to transfer from wheelchair to couch .  Reports that he is making progress.   PLAN: reviewed hypoglycemia and treatments. Encouraged patient to call MD for continued low readings. Reviewed progress and informed patient that I was very proud of him.  Will continue weekly transition of care calls.  Tomasa Rand, RN, BSN, CEN [facility] [employer] [phone]

## 2018-03-23 ENCOUNTER — Other Ambulatory Visit: Payer: Self-pay | Admitting: Family Medicine

## 2018-03-23 ENCOUNTER — Ambulatory Visit: Payer: PPO

## 2018-03-23 ENCOUNTER — Other Ambulatory Visit: Payer: Self-pay

## 2018-03-23 NOTE — Patient Outreach (Signed)
[facility]) Care Management  03/23/2018  Brandin Stetzer [facility] [birth year] [id number]   Follow up call to Mr. Paulino partner, Gwendlyn Deutscher to ensure receipt of ramp resources mailed.  Mr. [name] denied receiving these resources at this point.  BSW will follow up again before the end of the week and resend documentation if still not received.   BSW informed Mr. [name] that [name] from [place] has been out of the office and is returning today.  She reported that she will reach out to them this week regarding the referral.  BSW will follow up with her next week to get an update.  Ronn Melena, BSW Social Worker [phone]

## 2018-03-25 ENCOUNTER — Other Ambulatory Visit: Payer: Self-pay

## 2018-03-25 NOTE — Patient Outreach (Signed)
Francisco Hutchinson) Care Management  03/25/2018  Francisco Hutchinson [birth year] [id number]   Follow up call to patient's partner, Francisco Hutchinson, to ensure receipt of ramp resources.  Francisco Hutchinson said that he did not check the mail yesterday so he was about whether or not these resources arrived.  BSW communicated with Francisco Hutchinson from [facility] and said that she has been unable to make contact with Francisco Hutchinson or Francisco Hutchinson.  BSW provided Francisco Hutchinson with her contact information so that he can follow up with her regarding the next step in the referral process.  BSW will call again next week to determine if other ramp resources were received.   Francisco Hutchinson, BSW Social Worker [phone]

## 2018-03-29 DIAGNOSIS — Z4781 Encounter for orthopedic aftercare following surgical amputation: Secondary | ICD-10-CM

## 2018-03-29 DIAGNOSIS — F419 Anxiety disorder, unspecified: Secondary | ICD-10-CM

## 2018-03-29 DIAGNOSIS — E1122 Type 2 diabetes mellitus with diabetic chronic kidney disease: Secondary | ICD-10-CM

## 2018-03-29 DIAGNOSIS — I69354 Hemiplegia and hemiparesis following cerebral infarction affecting left non-dominant side: Secondary | ICD-10-CM

## 2018-03-29 DIAGNOSIS — Z794 Long term (current) use of insulin: Secondary | ICD-10-CM

## 2018-03-29 DIAGNOSIS — M150 Primary generalized (osteo)arthritis: Secondary | ICD-10-CM

## 2018-03-29 DIAGNOSIS — E441 Mild protein-calorie malnutrition: Secondary | ICD-10-CM

## 2018-03-29 DIAGNOSIS — I1310 Hypertensive heart and chronic kidney disease without heart failure, with stage 1 through stage 4 chronic kidney disease, or unspecified chronic kidney disease: Secondary | ICD-10-CM

## 2018-03-29 DIAGNOSIS — Z7982 Long term (current) use of aspirin: Secondary | ICD-10-CM

## 2018-03-29 DIAGNOSIS — N183 Chronic kidney disease, stage 3 (moderate): Secondary | ICD-10-CM

## 2018-03-29 DIAGNOSIS — E1151 Type 2 diabetes mellitus with diabetic peripheral angiopathy without gangrene: Secondary | ICD-10-CM

## 2018-03-29 DIAGNOSIS — Z89511 Acquired absence of right leg below knee: Secondary | ICD-10-CM

## 2018-03-29 DIAGNOSIS — F324 Major depressive disorder, single episode, in partial remission: Secondary | ICD-10-CM

## 2018-03-29 DIAGNOSIS — E785 Hyperlipidemia, unspecified: Secondary | ICD-10-CM

## 2018-03-29 DIAGNOSIS — B20 Human immunodeficiency virus [HIV] disease: Secondary | ICD-10-CM

## 2018-03-29 DIAGNOSIS — Z993 Dependence on wheelchair: Secondary | ICD-10-CM

## 2018-03-29 DIAGNOSIS — Z6832 Body mass index (BMI) 32.0-32.9, adult: Secondary | ICD-10-CM

## 2018-03-29 DIAGNOSIS — Z8674 Personal history of sudden cardiac arrest: Secondary | ICD-10-CM

## 2018-03-30 ENCOUNTER — Ambulatory Visit: Payer: Self-pay

## 2018-03-30 ENCOUNTER — Ambulatory Visit: Payer: PPO

## 2018-03-31 ENCOUNTER — Other Ambulatory Visit: Payer: Self-pay

## 2018-03-31 ENCOUNTER — Ambulatory Visit: Payer: Self-pay

## 2018-03-31 NOTE — Patient Outreach (Signed)
[facility]) Care Management  03/31/2018  Nameer Summer [facility] 1970/10/30 [id number]   Follow up call to patient's spouse, Gwendlyn Deutscher regarding social work referral for home modification and resources provided.  BSW left voicemail message.  Will attempt to reach him again within four business days.   Unsuccessful outreach letter mailed.   Ronn Melena, BSW Social Worker [phone]

## 2018-04-01 ENCOUNTER — Other Ambulatory Visit: Payer: Self-pay

## 2018-04-01 NOTE — Patient Outreach (Signed)
Francisco Hutchinson) Care Management   04/01/2018  [place] 01-01-1971 [id number]  Francisco Hutchinson is an 47 y.o. male Arrived for home visit, found patient sitting in motorized scooter eating lunch.  Subjective: Patient reports DM is better. Recording daily CBG readings in calendar.  Range is 57-224.  However mostly around 100.  Todays CBG of 123.   Reports wounds are healing and he will get his "leg" next Monday. Reports that he will need transportation to appointment.   Able to transfer from chair to sofa,  Chair to [facility], chair to bed but not from chair to car.   Reports UTI is gone. Finished antibiotics.   Denies falls.  Objective:  Awake and alert. Able to move chair without difficulty.   Today's Vitals   04/01/18 1315 04/01/18 1317  BP: 112/68   Pulse: 74   Resp: 16   SpO2: 97%   PainSc:  0-No pain   Review of Systems  Constitutional: Negative.   HENT: Negative.   Eyes: Negative.   Respiratory: Negative.   Cardiovascular: Negative.   Gastrointestinal: Negative.   Genitourinary: Negative.   Musculoskeletal: Negative for falls and joint pain.  Skin:       Scabbed area to  Right stump  Neurological: Negative.   Endo/Heme/Allergies: Negative.   Psychiatric/Behavioral: Negative.     Physical Exam  Constitutional: He is oriented to person, place, and time. He appears well-developed and well-nourished.  Cardiovascular: Normal rate, regular rhythm and normal heart sounds.  Respiratory: Effort normal and breath sounds normal.  GI: Soft. Bowel sounds are normal.  Musculoskeletal: Normal range of motion. He exhibits no edema.  Neurological: He is alert and oriented to person, place, and time.  Skin: Skin is warm and dry.  Right stump with 3 scabbed areas.  Left lateral foot with scabbed area and left 4th toe on posterior aspect with scab. No signs of infection.  Left leg dry and flaky.   Psychiatric: He has a normal mood and affect. His behavior is  normal. Judgment and thought content normal.    Encounter Medications:   Outpatient Encounter Medications as of 04/01/2018  Medication Sig Note  . acetaminophen (TYLENOL) 325 MG tablet Take 2 tablets (650 mg total) by mouth every 6 (six) hours as needed for mild pain, fever or headache (or Fever >/= 101).   Marland Kitchen acetaminophen (TYLENOL) 325 MG tablet Take 1-2 tablets (325-650 mg total) by mouth every 6 (six) hours as needed for mild pain (pain score 1-3 or temp > 100.5).   Marland Kitchen amLODipine (NORVASC) 5 MG tablet Take 1 tablet (5 mg total) by mouth daily.   Marland Kitchen aspirin EC 81 MG tablet Take 81 mg by mouth at bedtime.   Marland Kitchen atorvastatin (LIPITOR) 40 MG tablet TAKE 1 TABLET BY MOUTH ONCE DAILY   . Continuous Blood Gluc Sensor (FREESTYLE LIBRE SENSOR SYSTEM) MISC Use 4 times daily as needed to check blood sugar.   . DESCOVY 200-25 MG tablet Take 1 tablet by mouth at bedtime.    . fenofibrate 160 MG tablet Take 0.5 tablets (80 mg total) by mouth at bedtime. 01/18/2018: This tablet MUST be cut in half, then halved again (patient doesn't swallow tablets very well)  . ferrous gluconate (FERGON) 324 MG tablet Take 1 tablet (324 mg total) by mouth daily with breakfast.   . hydrochlorothiazide (HYDRODIURIL) 25 MG tablet Take 1 tablet (25 mg total) by mouth daily.   Marland Kitchen LANTUS SOLOSTAR 100 UNIT/ML Solostar Pen Inject  55 Units into the skin at bedtime.   . metoprolol tartrate (LOPRESSOR) 100 MG tablet Take 100 mg by mouth 2 (two) times daily.    . sertraline (ZOLOFT) 100 MG tablet Take 100 mg by mouth every morning. 01/18/2018: Patient takes 2 strengths of this med  . sertraline (ZOLOFT) 50 MG tablet Take 75 mg by mouth at bedtime. 01/18/2018: Patient takes 2 strengths of this med  . TIVICAY 50 MG tablet Take 50 mg by mouth daily.    . TRULICITY 1.5 YT/0.1SW SOPN  INJECT 1 PEN (1.'5MG'$ ) SUBCUTANEOUSLY ONCE A WEEK ON SUNDAY 01/18/2018: Regimen confirmed to be accurate by the patient's significant other  . TRULICITY 1.5 FU/9.3AT  SOPN INJECT 1.'5MG'$  SUBCUTANEOUSLY ONCE A WEEK ON SUNDAY   . cefixime (SUPRAX) 400 MG CAPS capsule Take 1 capsule (400 mg total) by mouth daily. (Patient not taking: Reported on 04/01/2018)   . docusate sodium (COLACE) 100 MG capsule Take 1 capsule (100 mg total) by mouth daily as needed for mild constipation. (Patient not taking: Reported on 04/01/2018)   . doxycycline (VIBRA-TABS) 100 MG tablet Take 1 tablet (100 mg total) by mouth 2 (two) times daily. (Patient not taking: Reported on 04/01/2018)   . fenofibrate micronized (ANTARA) 43 MG capsule Take 1 capsule (43 mg total) by mouth daily before breakfast.   . insulin aspart (NOVOLOG) 100 UNIT/ML injection Inject 4 Units into the skin 3 (three) times daily with meals. (Patient not taking: Reported on 04/01/2018)   . methocarbamol (ROBAXIN) 500 MG tablet Take 1 tablet (500 mg total) by mouth every 8 (eight) hours as needed for muscle spasms. (Patient not taking: Reported on 04/01/2018)   . polyethylene glycol (MIRALAX / GLYCOLAX) packet Take 17 g by mouth daily as needed for mild constipation. (Patient not taking: Reported on 04/01/2018)   . [DISCONTINUED] nutrition supplement, JUVEN, (JUVEN) PACK Take 1 packet by mouth 2 (two) times daily between meals. (Patient not taking: Reported on 04/01/2018)    No facility-administered encounter medications on file as of 04/01/2018.     Functional Status:   In your present state of health, do you have any difficulty performing the following activities: 03/02/2018 01/19/2018  Hearing? N N  Vision? N N  Difficulty concentrating or making decisions? Y N  Walking or climbing stairs? Y Y  Dressing or bathing? Y N  Doing errands, shopping? [name]  Preparing Food and eating ? N -  Using the Toilet? N -  In the past six months, have you accidently leaked urine? N -  Do you have problems with loss of bowel control? N -  Managing your Medications? [name] Manages medications -  Managing your Finances? N -   Housekeeping or managing your Housekeeping? N -  Some recent data might be hidden    Fall/Depression Screening:    Fall Risk  03/02/2018 04/23/2017  Falls in the past year? Yes Yes  Number falls in past yr: 2 or more 2 or more  Injury with Fall? No No  Risk Factor Category  High Fall Risk -  Risk for fall due to : Impaired balance/gait;Impaired mobility -   PHQ 2/9 Scores 03/02/2018 05/28/2017 04/23/2017  PHQ - 2 Score 0 0 0    Assessment:   (1) DM: taking medications as prescribed. CBG log kept and range around 100. Pending A1c in 2 weeks.  Encouraged patient to continue to monitor CBG and record. Take log to MD appointment. Trying to follow diet.  (  2) Skin:  Wound healing to right stump, left foot. No signs of infection (3) needs transportation to MD appointments as patient is not able to transfer to private car. (4) home health has stopped and will restart when patient gets his leg in 1 week.   Plan:  (1) reviewed with patient to continue to monitor and record daily. Reviewed follow up planned with MD for repeat A1c. (2)reviewed signs of infection and when to call MD. Encouraged patient to report any changes to wounds to MD.  (3) reviewed with patient. Patient reports he will call social worker to assist. Provided contact phone number for social worker.  (4) reviewed with patient to continue to do his home exercises that he was taught to improve strength.   Next home visit planned for 1 month.  THN CM Care Plan Problem One     Most Recent Value  Care Plan Problem One  recent amputation   Role Documenting the Problem One  Care Management Coordinator  Care Plan for Problem One  Active  THN Long Term Goal   Patient will report decreased in Hgb A1c  to 8 in the next 60 days,.  THN Long Term Goal Start Date  03/02/18 Barrie Folk revised after home visit. ]  Interventions for Problem One Long Term Goal  appointment for labs in 2 weeks. Encouraged patient to attend appointment. Reviewed log  and CBG ranges. Encouraged diet.   THN CM Short Term Goal #1   Patient will report attending MD appoitnment as suggested in the next 2 weeks.   THN CM Short Term Goal #1 Start Date  02/22/18  THN CM Short Term Goal #1 Met Date  03/16/18  THN CM Short Term Goal #2   Patient will report no falls in the next 30 days.  THN CM Short Term Goal #2 Start Date  02/22/18  [facility] CM Short Term Goal #2 Met Date  04/01/18    [facility] CM Care Plan Problem Two     Most Recent Value  Care Plan Problem Two  Alteration in skin intergirty related to sore on bottom of left foot.  Role Documenting the Problem Two  Care Management Coordinator  Care Plan for Problem Two  Active  THN CM Short Term Goal #1   Patient will report healed sore on the bottom of the left foot in the next 30 days.   THN CM Short Term Goal #1 Start Date  03/03/18  [facility] CM Short Term Goal #1 Met Date   04/01/18     Tomasa Rand, RN, BSN, CEN [place] Coordinator [phone]

## 2018-04-02 ENCOUNTER — Other Ambulatory Visit: Payer: Self-pay

## 2018-04-02 NOTE — Patient Outreach (Signed)
[facility]) Care Management  04/02/2018  Tadhg Eskew [facility] Apr 12, 1971 [id number]   Follow up call to Mr. Schaible regarding referral for home modification.  BSW informed Mr. Niven that a referral was made to [facility] but the Human Services Coordinator, [name], has been unable to reach him or Mr. [name].  BSW provided Mr. Haliburton with contact information for Ezzard Flax and encouraged him to call her so that she can continue with the referral process.  BSW asked Mr. Hicks if he received the other resources that were mailed for [place] and [place].  He was not sure if they were received.  He said that he would check with Mr. [name] and call back to let me know if they need to be resent.  Mr. Miller requested transportation for appointment at [facility] on Monday, 04/05/18 @ 1:00.  This was arranged through [employer].  BSW reminded Mr. Bohr that [facility] cannot provide transportation indefinitely.  BSW reminded him that [employer] provides transportation to [place] on Tuesdays and encouraged him to utilize this service.    Ronn Melena, BSW Social Worker [phone]

## 2018-04-07 ENCOUNTER — Other Ambulatory Visit: Payer: Self-pay

## 2018-04-07 NOTE — Patient Outreach (Signed)
[facility]) Care Management  04/07/2018  Francisco Hutchinson [facility] 02-02-1971 [id number]   BSW received voicemail message from Francisco Hutchinson on 04/06/18 requesting transportation to an appointment at [facility] on 04/27/18.  BSW attempted to call him back but had to leave a voicemail message.  BSW talked with Francisco Hutchinson partner, Francisco Hutchinson.  BSW requested that he or Francisco Hutchinson contact [place] to schedule a ride for his 04/06/18 appointment as they provide rides to [facility] on Tuesdays.  BSW reiterated to Francisco Hutchinson that [facility] cannot provide transportation indefinitely and that other resources should be utilized.  Francisco Hutchinson expressed his understanding of this.  Francisco Hutchinson reported that Francisco Hutchinson has a spot on his scrotum that they are concerned about and said that they intend to schedule an appointment with his primary MD as soon as possible.  BSW agreed to schedule transportation via [place] if the appointment is scheduled within the next couple of days.  If it is scheduled three business days or more from now, Francisco Hutchinson can utilize [place] asked Francisco Hutchinson if he or Francisco Hutchinson have contacted Francisco Hutchinson from [place] regarding the referral submitted for home modifications.  Francisco Hutchinson reported that they have not yet contacted her so BSW provided him with her contact information again.  Francisco Hutchinson is unsure if they received the information that was mailed for [place] [employer] and requested that it be mailed again.  Mailed today.  BSW will follow up next week to ensure receipt.   Ronn Melena, BSW Social Worker [phone]

## 2018-04-08 ENCOUNTER — Telehealth: Payer: Self-pay | Admitting: Family Medicine

## 2018-04-08 ENCOUNTER — Ambulatory Visit: Payer: Self-pay

## 2018-04-08 ENCOUNTER — Ambulatory Visit: Payer: PPO | Admitting: Family Medicine

## 2018-04-08 NOTE — Telephone Encounter (Signed)
Copied from [place] [id number]. Topic: General - Other >> Apr 08, 2018  2:05 PM Alfredia Ferguson R wrote: Francisco Hutchinson is calling in for pt to see what steps he needs to follow to where insurance will cover depends, alcohol wipes, gloves, and power wheelchair and bed   Cb# [id number]

## 2018-04-09 NOTE — Telephone Encounter (Signed)
Called patient let him know that you are out of office today but will give message for when you come back next week.

## 2018-04-13 NOTE — Telephone Encounter (Signed)
Has he called his insurance to check? Sometimes they may need a face-to-face encounter.  Algis Greenhouse. [name], MD 04/13/2018 1:39 PM

## 2018-04-14 NOTE — Telephone Encounter (Signed)
Spoke with Gwendlyn Deutscher.  He states patient has an appointment on 04/27/2018 for face to face encounter.

## 2018-04-15 ENCOUNTER — Ambulatory Visit: Payer: PPO | Admitting: Family Medicine

## 2018-04-15 ENCOUNTER — Other Ambulatory Visit: Payer: Self-pay

## 2018-04-15 NOTE — Patient Outreach (Addendum)
[facility]) Care Management  04/15/2018  Gustav Knueppel [facility] 15-Oct-1970 [id number]   BSW talked with patient's partner, Gwendlyn Deutscher, to ensure receipt of home modification resources that were resent on 04/07/18. Mr. [name] reported that, due to his own hospitalization, he is not sure that resources were received. He reported that he and [name] with [facility] have been playing "phone tag" regarding the referral submitted.  BSW offered to connect with Mar[place] on his and patient's behalf but Mr. Best declined at this time.  He reported that he recently lost his job and is almost out of minutes on his phone.  He said that he would call Marva when he or Mr. Everett have a reliable contact number.   Mr. [name] reported that patient has an appointment with Dr. Dimas Chyle on 04/27/18 but he feels he needs to be seen before then but does not have transportation.  BSW agreed to facilitate transportation if he is able to get an earlier appointment. BSW will follow up again next week if no update from Mr. [name].     Addendum: Gwendlyn Deutscher called back and reported that appointment with Dr. [name] was changed to 04/16/18 @ 1:25.  BSW arranged transportation via [employer].  Ronn Melena, BSW Social Worker [phone]

## 2018-04-16 ENCOUNTER — Encounter: Payer: Self-pay | Admitting: Family Medicine

## 2018-04-16 ENCOUNTER — Ambulatory Visit (INDEPENDENT_AMBULATORY_CARE_PROVIDER_SITE_OTHER): Payer: PPO | Admitting: Family Medicine

## 2018-04-16 VITALS — BP 114/64 | HR 69 | Temp 98.3°F | Ht 70.0 in

## 2018-04-16 DIAGNOSIS — N5089 Other specified disorders of the male genital organs: Secondary | ICD-10-CM | POA: Diagnosis not present

## 2018-04-16 DIAGNOSIS — Z89511 Acquired absence of right leg below knee: Secondary | ICD-10-CM | POA: Diagnosis not present

## 2018-04-16 DIAGNOSIS — Z794 Long term (current) use of insulin: Secondary | ICD-10-CM | POA: Diagnosis not present

## 2018-04-16 DIAGNOSIS — L98421 Non-pressure chronic ulcer of back limited to breakdown of skin: Secondary | ICD-10-CM

## 2018-04-16 DIAGNOSIS — E1122 Type 2 diabetes mellitus with diabetic chronic kidney disease: Secondary | ICD-10-CM | POA: Diagnosis not present

## 2018-04-16 DIAGNOSIS — N183 Chronic kidney disease, stage 3 (moderate): Secondary | ICD-10-CM

## 2018-04-16 DIAGNOSIS — E1169 Type 2 diabetes mellitus with other specified complication: Secondary | ICD-10-CM | POA: Diagnosis not present

## 2018-04-16 DIAGNOSIS — R5381 Other malaise: Secondary | ICD-10-CM | POA: Diagnosis not present

## 2018-04-16 DIAGNOSIS — L98429 Non-pressure chronic ulcer of back with unspecified severity: Secondary | ICD-10-CM | POA: Insufficient documentation

## 2018-04-16 DIAGNOSIS — E785 Hyperlipidemia, unspecified: Secondary | ICD-10-CM

## 2018-04-16 LAB — CBC
HCT: 34.7 % — ABNORMAL LOW (ref 39.0–52.0)
Hemoglobin: 11.4 g/dL — ABNORMAL LOW (ref 13.0–17.0)
MCHC: 32.7 g/dL (ref 30.0–36.0)
MCV: 90.1 fl (ref 78.0–100.0)
Platelets: 287 10*3/uL (ref 150.0–400.0)
RBC: 3.86 Mil/uL — ABNORMAL LOW (ref 4.22–5.81)
RDW: 15.3 % (ref 11.5–15.5)
WBC: 11.3 10*3/uL — ABNORMAL HIGH (ref 4.0–10.5)

## 2018-04-16 LAB — COMPREHENSIVE METABOLIC PANEL
ALT: 15 U/L (ref 0–53)
AST: 17 U/L (ref 0–37)
Albumin: 3.2 g/dL — ABNORMAL LOW (ref 3.5–5.2)
Alkaline Phosphatase: 93 U/L (ref 39–117)
BILIRUBIN TOTAL: 0.3 mg/dL (ref 0.2–1.2)
BUN: 34 mg/dL — ABNORMAL HIGH (ref 6–23)
CO2: 28 meq/L (ref 19–32)
CREATININE: 1.35 mg/dL (ref 0.40–1.50)
Calcium: 8.7 mg/dL (ref 8.4–10.5)
Chloride: 99 mEq/L (ref 96–112)
GFR: 60.71 mL/min (ref >60.00)
GLUCOSE: 367 mg/dL — AB (ref 70–99)
Potassium: 4 mEq/L (ref 3.5–5.1)
Sodium: 134 mEq/L — ABNORMAL LOW (ref 135–145)
Total Protein: 7.1 g/dL (ref 6.0–8.3)

## 2018-04-16 LAB — TSH: TSH: 1.25 u[IU]/mL (ref 0.35–4.50)

## 2018-04-16 LAB — HEMOGLOBIN A1C: Hgb A1c MFr Bld: 10 % — ABNORMAL HIGH (ref 4.6–6.5)

## 2018-04-16 MED ORDER — METOPROLOL TARTRATE 100 MG PO TABS: 100.0000 mg | ORAL_TABLET | Freq: Two times a day (BID) | ORAL | 3 refills | Status: DC

## 2018-04-16 MED ORDER — INCONTINENCE SUPPLIES KIT: PACK | 0 refills | Status: AC

## 2018-04-16 MED ORDER — LOPERAMIDE HCL 2 MG PO TABS: 4.0000 mg | ORAL_TABLET | Freq: Four times a day (QID) | ORAL | 0 refills | Status: DC | PRN

## 2018-04-16 MED ORDER — FENOFIBRATE MICRONIZED 134 MG PO CAPS: 134.0000 mg | ORAL_CAPSULE | Freq: Every day | ORAL | 5 refills | Status: DC

## 2018-04-16 MED ORDER — SERTRALINE HCL 25 MG PO TABS: 25.0000 mg | ORAL_TABLET | Freq: Every day | ORAL | 3 refills | Status: DC

## 2018-04-16 NOTE — Assessment & Plan Note (Addendum)
Referral for home health nursing and home health aide placed.  DME order also given for incontinence supplies.

## 2018-04-16 NOTE — Patient Instructions (Addendum)
It was very nice to see you today!  I will place orders for a bed, wheelchair, and home health.  I will also order fenofibrate and zoloft.  We will check blood work today.  Please take imodium 4mg  4 times daily for loose stools. Let me know if not improving or worsening in 1-2 weeks.   Take care, Dr [name]

## 2018-04-16 NOTE — Assessment & Plan Note (Signed)
Check A1c.  Check CBC, CMET, and TSH.  Continue Trulicity 1.5 mg weekly, Lantus 55 units daily at bedtime

## 2018-04-16 NOTE — Assessment & Plan Note (Signed)
DME order for hospital bed and electric wheelchair placed.

## 2018-04-16 NOTE — Assessment & Plan Note (Signed)
Refilled fenofibrate.  Continue atorvastatin.

## 2018-04-16 NOTE — Assessment & Plan Note (Signed)
Secondary to pressure injury.  Will place referral to home health nursing for wound care as noted above.  No signs of infection.

## 2018-04-16 NOTE — Progress Notes (Signed)
   Subjective:  Francisco Hutchinson is a 47 y.o. male who presents today with a chief complaint of scrotal wound.   HPI:  Scrotal Wound, established problem Patient seen about a month ago for this. He was started on a course of doxycycline for possible cellulitis. Unfortunately since then, his wound has progressed. No reported fevers or chills. Has had several episodes of drainage.   He also has another lesion on his low back near his gluteal cleft.   Diarrhea, acute problem Started a couple of weeks ago. No fevers or chills. Stools has been very loose and runny. Improving over the last few days.   HLD, chronic problem, stable Doing well on fenofibrate but needs refill.   Depression Currently on zoloft on 175mg . Would like to have a 25mg  tablet sent in.   ROS: Per HPI  PMH: He reports that he has never smoked. He has never used smokeless tobacco. He reports that he drinks about 2.0 standard drinks of alcohol per week. He reports that he does not use drugs.  Objective:  Physical Exam: BP 114/64 (BP Location: Left Arm, Patient Position: Sitting, Cuff Size: Normal)   Pulse 69   Temp 98.3 F (36.8 C) (Oral)   Ht 5\' 10"  (1.778 m)   SpO2 96%   BMI 33.00 kg/m   Gen: NAD, resting comfortably CV: RRR with no murmurs appreciated Pulm: NWOB, CTAB with no crackles, wheezes, or rhonchi GI: Normal bowel sounds present. Soft, Nontender, Nondistended. GU: Several superficial ulcers noted on posterior scrotum.  No surrounding erythema.  No purulent drainage.  Superficial ulcer noted also at the apex of gluteal cleft.  Assessment/Plan:  Type 2 diabetes mellitus with diabetic chronic kidney disease (HCC) Check A1c.  Check CBC, CMET, and TSH.  Continue Trulicity 1.5 mg weekly, Lantus 55 units daily at bedtime  S/P unilateral BKA (below knee amputation), right [facility]) DME order for hospital bed and electric wheelchair placed.  Debility Referral for home health nursing and home health aide  placed.  DME order also given for incontinence supplies.  Hyperlipidemia associated with type 2 diabetes mellitus (HCC) Refilled fenofibrate.  Continue atorvastatin.  Sacral ulcer ([place]) Secondary to debility secondary to status post right BKA.  Will place home health nursing referral for monitoring and management.  No signs of infection.  Scrotal ulcer Secondary to pressure injury.  Will place referral to home health nursing for wound care as noted above.  No signs of infection.  Diarrhea Unclear etiology.  May be viral.  Abdominal exam benign.  No red flag signs or symptoms.  Recommended patient use Imodium as needed up to 16 mg daily.  Patient voiced understanding.  Discussed reasons return to care.  If no improvement, would consider referral to GI.  Time Spent: I spent >40 minutes face-to-face with the patient, with more than half spent on counseling for management plan for his pressure wounds, scrotal ulcer, diarrhea, hyperlipidemia, and debility.    Algis Greenhouse. [name], MD 04/16/2018 5:39 PM

## 2018-04-16 NOTE — Assessment & Plan Note (Addendum)
Secondary to debility secondary to status post right BKA.  Will place home health nursing referral for monitoring and management.  No signs of infection.

## 2018-04-20 NOTE — Progress Notes (Signed)
Please inform patient of the following:  His blood sugar is improving, but still much higher than where we need. The rest of his blood work is normal.  I would like for him to increase his Lantus to 60 units daily. Keep everything else the same.  Would like for him to keep a log of his blood sugars and come back to see me in 4-6 weeks.  Algis Greenhouse. [name], MD 04/20/2018 9:52 AM

## 2018-04-22 ENCOUNTER — Other Ambulatory Visit: Payer: Self-pay

## 2018-04-22 NOTE — Patient Outreach (Signed)
[facility]) Care Management  04/22/2018  Francisco Hutchinson [facility] 04-20-1971 [id number]   Follow up call to patient's partner, Francisco Hutchinson, to ensure receipt of resources resent on 04/15/18 and to determine if he has been able to connect with [name] from [place] about the home modification referral that was submitted.  Mr. [name] requested that BSW contact Ms. [name] as he has not had luck connecting with her.  Mr. [name] said he and Mr. [name] have not gone through their mail recently so he was unsure if resources have been received.  BSW will contact Ms [name] from [place] and follow up with Mr. [name] and or Mr. [name].     Ronn Melena, BSW Social Worker [phone]

## 2018-04-23 ENCOUNTER — Telehealth: Payer: Self-pay | Admitting: Family Medicine

## 2018-04-23 DIAGNOSIS — I129 Hypertensive chronic kidney disease with stage 1 through stage 4 chronic kidney disease, or unspecified chronic kidney disease: Secondary | ICD-10-CM | POA: Diagnosis not present

## 2018-04-23 DIAGNOSIS — B20 Human immunodeficiency virus [HIV] disease: Secondary | ICD-10-CM | POA: Diagnosis not present

## 2018-04-23 DIAGNOSIS — N183 Chronic kidney disease, stage 3 (moderate): Secondary | ICD-10-CM | POA: Diagnosis not present

## 2018-04-23 DIAGNOSIS — Z89511 Acquired absence of right leg below knee: Secondary | ICD-10-CM | POA: Diagnosis not present

## 2018-04-23 DIAGNOSIS — E785 Hyperlipidemia, unspecified: Secondary | ICD-10-CM | POA: Diagnosis not present

## 2018-04-23 DIAGNOSIS — Z8673 Personal history of transient ischemic attack (TIA), and cerebral infarction without residual deficits: Secondary | ICD-10-CM | POA: Diagnosis not present

## 2018-04-23 DIAGNOSIS — Z794 Long term (current) use of insulin: Secondary | ICD-10-CM | POA: Diagnosis not present

## 2018-04-23 DIAGNOSIS — E1122 Type 2 diabetes mellitus with diabetic chronic kidney disease: Secondary | ICD-10-CM | POA: Diagnosis not present

## 2018-04-23 DIAGNOSIS — N492 Inflammatory disorders of scrotum: Secondary | ICD-10-CM | POA: Diagnosis not present

## 2018-04-23 NOTE — Telephone Encounter (Signed)
Copied from [place] [id number]. Topic: Quick Communication - See Telephone Encounter >> Apr 23, 2018  1:52 PM Burchel, Abbi R wrote: CRM for notification. See Telephone encounter for: 04/23/18.  [facility] care) requesting v/o for home health 1x 1wk, 2x 1wk, 1x 2wk, and 2 PRN visits for wound care for scrotum and sacrum.  Orders to include: cleanse daily with saline, use protective ointment on scrotum and apply Santyl to sacrum (requires rx that will need to be called in).   [phone] ok to leave VM

## 2018-04-23 NOTE — Telephone Encounter (Signed)
See note

## 2018-04-23 NOTE — Telephone Encounter (Signed)
I have given verbal orders.  Please advise on Santyl.

## 2018-04-27 ENCOUNTER — Ambulatory Visit: Payer: PPO | Admitting: Family Medicine

## 2018-04-27 NOTE — Telephone Encounter (Signed)
[place] with santyl rx.  Algis Greenhouse. [name], MD 04/27/2018 9:21 AM

## 2018-04-28 ENCOUNTER — Other Ambulatory Visit: Payer: Self-pay

## 2018-04-28 ENCOUNTER — Telehealth: Payer: Self-pay | Admitting: Family Medicine

## 2018-04-28 DIAGNOSIS — Z794 Long term (current) use of insulin: Secondary | ICD-10-CM | POA: Diagnosis not present

## 2018-04-28 DIAGNOSIS — B20 Human immunodeficiency virus [HIV] disease: Secondary | ICD-10-CM | POA: Diagnosis not present

## 2018-04-28 DIAGNOSIS — E785 Hyperlipidemia, unspecified: Secondary | ICD-10-CM | POA: Diagnosis not present

## 2018-04-28 DIAGNOSIS — N183 Chronic kidney disease, stage 3 (moderate): Secondary | ICD-10-CM | POA: Diagnosis not present

## 2018-04-28 DIAGNOSIS — Z8673 Personal history of transient ischemic attack (TIA), and cerebral infarction without residual deficits: Secondary | ICD-10-CM | POA: Diagnosis not present

## 2018-04-28 DIAGNOSIS — N492 Inflammatory disorders of scrotum: Secondary | ICD-10-CM | POA: Diagnosis not present

## 2018-04-28 DIAGNOSIS — I129 Hypertensive chronic kidney disease with stage 1 through stage 4 chronic kidney disease, or unspecified chronic kidney disease: Secondary | ICD-10-CM | POA: Diagnosis not present

## 2018-04-28 DIAGNOSIS — Z89511 Acquired absence of right leg below knee: Secondary | ICD-10-CM | POA: Diagnosis not present

## 2018-04-28 DIAGNOSIS — E1122 Type 2 diabetes mellitus with diabetic chronic kidney disease: Secondary | ICD-10-CM | POA: Diagnosis not present

## 2018-04-28 MED ORDER — COLLAGENASE 250 UNIT/GM EX OINT: TOPICAL_OINTMENT | CUTANEOUS | 3 refills | Status: DC

## 2018-04-28 NOTE — Telephone Encounter (Signed)
Copied from [place]. Topic: Quick Communication - See Telephone Encounter >> Apr 28, 2018  2:28 PM Berneta Levins wrote: CRM for notification. See Telephone encounter for: 04/28/18.  Allen from [facility] calling to get verbal orders for PT 2x week for 2 weeks 1x week for 3 weeks Francisco Hutchinson can be reached at [phone], [place] to leave a message

## 2018-04-28 NOTE — Telephone Encounter (Signed)
Rx for Francisco Hutchinson has been sent to patient's pharmacy.

## 2018-04-28 NOTE — Telephone Encounter (Signed)
See note

## 2018-04-29 ENCOUNTER — Other Ambulatory Visit: Payer: Self-pay

## 2018-04-29 NOTE — Telephone Encounter (Signed)
Verbal orders given  

## 2018-04-29 NOTE — Patient Outreach (Signed)
[facility]) Care Management  04/28/18  Francisco Hutchinson [facility] 03/03/1971 [id number]   BSW received return phone call from [name] at [place]. BSW requested that she attempt to contact patient again regarding home modifications.  Ezzard Flax reported that she has called several times and has not received a return call.  She said that she will attempt to contact again tomorrow.  BSW will follow up with patient and Marva next week.  Ronn Melena, BSW Social Worker [phone]

## 2018-05-03 ENCOUNTER — Ambulatory Visit: Payer: Self-pay

## 2018-05-03 ENCOUNTER — Other Ambulatory Visit: Payer: Self-pay

## 2018-05-03 NOTE — Patient Outreach (Signed)
[facility]) Care Management   05/03/2018  [place] [birth year] [id number]  Francisco Hutchinson is an 47 y.o. male Arrived for home visit. Patient sitting in his scooter.  Subjective: Patient reports that he gets his artifical leg tomorrow.  Patient reports that he could not get a ride with [employer]; so he reports that he will be driven by family.  DM: Todays CBG of 76 14 day average of 143 30 day average of 140.  Not following diet but taking medications as prescribed. Recent increase in Lantus to 60 units per day.  04/16/2018  A1c of 10.0  Recording daily CBG's.  No recent falls. Still active with home health to monitors scrotum for skin break down.  Patient reports healed scrotum.   Objective:  Patient actively picking at skin until he bleeds. Awake and alert. Partner present.  Today's Vitals   05/03/18 1324 05/03/18 1327  BP: 102/62   Pulse: 95   Resp: 18   SpO2: 97%   PainSc:  0-No pain   Review of Systems  Constitutional: Negative.   HENT: Negative.   Eyes: Negative.   Respiratory: Negative.   Cardiovascular: Negative.   Gastrointestinal: Negative.   Genitourinary: Positive for frequency.  Musculoskeletal: Negative.   Skin:       Picking at skin   Neurological: Negative.   Endo/Heme/Allergies: Negative.   Psychiatric/Behavioral: Negative.     Physical Exam  Constitutional: He is oriented to person, place, and time. He appears well-developed and well-nourished.  Cardiovascular: Normal rate, normal heart sounds and intact distal pulses.  Respiratory: Effort normal and breath sounds normal.  GI: Soft. Bowel sounds are normal.  Musculoskeletal: Normal range of motion.  Neurological: He is alert and oriented to person, place, and time.  Skin: Skin is warm and dry.  3 hard scabs to the right stump,  Left foot 4th toe with open area. Left foot with open area to bottom of foot  X 2 and left foot open area to top of foot.   Psychiatric: He has  a normal mood and affect. His behavior is normal. Judgment and thought content normal.    Encounter Medications:   Outpatient Encounter Medications as of 05/03/2018  Medication Sig Note  . acetaminophen (TYLENOL) 325 MG tablet Take 2 tablets (650 mg total) by mouth every 6 (six) hours as needed for mild pain, fever or headache (or Fever >/= 101).   Marland Kitchen amLODipine (NORVASC) 5 MG tablet Take 1 tablet (5 mg total) by mouth daily.   Marland Kitchen aspirin EC 81 MG tablet Take 81 mg by mouth at bedtime.   Marland Kitchen atorvastatin (LIPITOR) 40 MG tablet TAKE 1 TABLET BY MOUTH ONCE DAILY   . DESCOVY 200-25 MG tablet Take 1 tablet by mouth at bedtime.    . fenofibrate micronized (LOFIBRA) 134 MG capsule Take 1 capsule (134 mg total) by mouth daily before breakfast.   . ferrous gluconate (FERGON) 324 MG tablet Take 1 tablet (324 mg total) by mouth daily with breakfast.   . hydrochlorothiazide (HYDRODIURIL) 25 MG tablet Take 1 tablet (25 mg total) by mouth daily. 05/03/2018: Takes 4 times per week  . Incontinence Supplies KIT Use as needed for incontinence   . LANTUS SOLOSTAR 100 UNIT/ML Solostar Pen Inject 55 Units into the skin at bedtime. (Patient taking differently: Inject 60 Units into the skin at bedtime. )   . loperamide (IMODIUM A-D) 2 MG tablet Take 2 tablets (4 mg total) by mouth 4 (four) times  daily as needed for diarrhea or loose stools.   . methocarbamol (ROBAXIN) 500 MG tablet Take 1 tablet (500 mg total) by mouth every 8 (eight) hours as needed for muscle spasms.   . metoprolol tartrate (LOPRESSOR) 100 MG tablet Take 1 tablet (100 mg total) by mouth 2 (two) times daily.   . sertraline (ZOLOFT) 100 MG tablet Take 100 mg by mouth every morning. Takes '175mg'$  total daily.  01/18/2018: Patient takes 2 strengths of this med  . sertraline (ZOLOFT) 25 MG tablet Take 1 tablet (25 mg total) by mouth daily.   . sertraline (ZOLOFT) 50 MG tablet Take 75 mg by mouth at bedtime. 01/18/2018: Patient takes 2 strengths of this med  .  TIVICAY 50 MG tablet Take 50 mg by mouth daily.    . TRULICITY 1.5 FU/9.3AT SOPN  INJECT 1 PEN (1.'5MG'$ ) SUBCUTANEOUSLY ONCE A WEEK ON SUNDAY 01/18/2018: Regimen confirmed to be accurate by the patient's significant other  . [DISCONTINUED] collagenase (SANTYL) ointment Apply one application to sacrum daily. (Patient not taking: Reported on 05/03/2018)    No facility-administered encounter medications on file as of 05/03/2018.     Functional Status:   In your present state of health, do you have any difficulty performing the following activities: 03/02/2018 01/19/2018  Hearing? N N  Vision? N N  Difficulty concentrating or making decisions? Y N  Walking or climbing stairs? Y Y  Dressing or bathing? Y N  Doing errands, shopping? [name]  Preparing Food and eating ? N -  Using the Toilet? N -  In the past six months, have you accidently leaked urine? N -  Do you have problems with loss of bowel control? N -  Managing your Medications? [place] Manages medications -  Managing your Finances? N -  Housekeeping or managing your Housekeeping? N -  Some recent data might be hidden    Fall/Depression Screening:    Fall Risk  03/02/2018 04/23/2017  Falls in the past year? Yes Yes  Number falls in past yr: 2 or more 2 or more  Injury with Fall? No No  Risk Factor Category  High Fall Risk -  Risk for fall due to : Impaired balance/gait;Impaired mobility -   PHQ 2/9 Scores 03/02/2018 05/28/2017 04/23/2017  PHQ - 2 Score 0 0 0    Assessment:   (1) reviewed CBG today of 76,   A1c 2 weeks ago of 10.0.   See above averages. Patient is taking his medications as prescribed but not following his diet.  (2) appointment with biotech tomorrow for prosthetic    (3) skin: reviewed open areas of skin. See assessment. (4) requesting assistance with bathing.  Plan:  (1) encouraged patient to monitor his CBG at bedtime before taking daily dose of Lantus.  Again reviewed with patient importance of diet and  patient is not interested in following diet. Patient has the knowledge but lacks the motivation of following his diet. Encouraged patient to continue to take his medications as prescribed. Reviewed goal of A1c to be under 8.0 (2) reviewed with patient fall precautions and following directions from PT and OT. (3) reviewed signs of infections and when to call MD. Reviewed increased risk of infections with picking of the skin. Showed partner open areas on foot. Encouraged patient to use lotion to dry skin and report any signs of infection.  (4) in basket message sent to Education officer, museum.  Reviewed goals with patient who is no longer in  need of home visits. Will refer to [place] coach.   THN CM Care Plan Problem One     Most Recent Value  Care Plan Problem One  recent amputation   Role Documenting the Problem One  Care Management Coordinator  Care Plan for Problem One  Active  THN Long Term Goal   Patient will report decreased in Hgb A1c  to 8 in the next 60 days,.  THN Long Term Goal Start Date  05/03/18 Barrie Folk revised after home visit. ]  THN Long Term Goal Met Date  -- [goal not met, date restarted. ]  Interventions for Problem One Long Term Goal  reviewed goal and not achieved at this time. Reviewed averages and showed patient how to review his own averagees on his meter. Encouraged patient to keep an eye on his carbs and food intake. Referral to [place] coach completed. Home visit completed today.   THN CM Short Term Goal #1   Patient will report attending MD appoitnment as suggested in the next 2 weeks.   THN CM Short Term Goal #1 Start Date  02/22/18  THN CM Short Term Goal #1 Met Date  03/16/18  THN CM Short Term Goal #2   Patient will report no falls in the next 30 days.  THN CM Short Term Goal #2 Start Date  02/22/18  [facility] CM Short Term Goal #2 Met Date  04/01/18    [facility] CM Care Plan Problem Two     Most Recent Value  Care Plan Problem Two  Alteration in skin intergirty related to sore  on bottom of left foot.  Role Documenting the Problem Two  Care Management Coordinator  Care Plan for Problem Two  Active  THN CM Short Term Goal #1   Patient will report healed sore on the bottom of the left foot in the next 30 days.   THN CM Short Term Goal #1 Start Date  03/03/18  [facility] CM Short Term Goal #1 Met Date   04/01/18    Update letter sent to MD  Tomasa Rand, RN, BSN, CEN [place] Coordinator [phone]

## 2018-05-04 ENCOUNTER — Telehealth: Payer: Self-pay | Admitting: Family Medicine

## 2018-05-04 ENCOUNTER — Other Ambulatory Visit: Payer: Self-pay

## 2018-05-04 NOTE — Patient Outreach (Signed)
[facility]) Care Management  05/04/2018  Francisco Hutchinson 28-Jul-1971 [id number]   Outreach call regarding status of referral for home modification and request for home health aide.   BSW left voicemail message for Francisco Hutchinson.  BSW was able to reach his partner, Francisco Hutchinson.  Francisco Hutchinson reported that he has not recently heard from Francisco Hutchinson at [facility].  He did acknowledge that she previously contacted him but it was typically at a time that he was unable to talk.  BSW provided him with her contact number again and encouraged him to call.  Francisco Hutchinson inquired about other resources for home modifications.  BSW reminded him that additional resources have been mailed to them on two occassions but they have been unable to confirm receipt of these.  BSW provided him with contact information for [place] [facility] [place] received in basket message from [employer], Francisco Hutchinson, on 05/03/18 regarding Francisco Hutchinson request for a home health aide to assist with bathing once per week.  BSW spoke with Francisco Hutchinson about this and informed him that this service can be provided by [place] [facility] in conjunction with physical therapy.  BSW contacted Dr. Warrick Parisian office regarding the order for these services.  BSW contacted [facility] to inquire about their free [place].  Unfortunately, these services are not an option for Francisco Hutchinson upon discharge of physical therapy because their funding source requires that recipients be 52 years of age or older.  BSW called Francisco Hutchinson back to inform him of this information.  Francisco Hutchinson is hoping that Francisco Hutchinson progresses appropriately through physical therapy and that he can assist with bathing upon discharge of these services versus having to private pay for an aide.  BSW will follow up next week to ensure home health aide is in place.   Ronn Melena, BSW Social  Worker [phone]

## 2018-05-04 NOTE — Telephone Encounter (Signed)
Copied from [place] [id number]. Topic: Quick Communication - See Telephone Encounter >> May 04, 2018 11:07 AM Conception Chancy, NT wrote: CRM for notification. See Telephone encounter for: 05/04/18.  Amber is a Nurse, learning disability with [facility] and states the patient would like a home health aid to come in and assist with bathing atleast 1x a week.  Cb# [phone]

## 2018-05-05 NOTE — Telephone Encounter (Signed)
Called and gave verbal orders on VM.  Asked that she call back if anything else is needed.

## 2018-05-06 ENCOUNTER — Telehealth: Payer: Self-pay | Admitting: Family Medicine

## 2018-05-06 NOTE — Telephone Encounter (Signed)
Copied from [place] [phone]. Topic: Quick Communication - See Telephone Encounter >> May 06, 2018  8:14 AM [place] Chancy, NT wrote: CRM for notification. See Telephone encounter for: 05/06/18.  Zenia Resides is a physical therapist with [facility] and states they have placed on hold the gate training with right prosthesis. The patient will not receive the prosthesis for 2-4 weeks due to his right stump still healing. Zenia Resides will call back for orders when the patient receives the prosthesis.    Allen# [phone]

## 2018-05-06 NOTE — Telephone Encounter (Signed)
Noted  

## 2018-05-06 NOTE — Telephone Encounter (Signed)
See note

## 2018-05-07 ENCOUNTER — Encounter: Payer: Self-pay | Admitting: *Deleted

## 2018-05-10 ENCOUNTER — Other Ambulatory Visit: Payer: Self-pay

## 2018-05-10 NOTE — Patient Outreach (Signed)
[facility]) Care Management  05/10/2018  Francisco Hutchinson Francisco Hutchinson 09/17/1970 [id number]  Follow up call regarding request for CNA services in conjunction with physical therapy. BSW spoke with patient's partner, Francisco Hutchinson.  [facility] has placed a hold on physical therapy because Francisco Hutchinson will not receive his prothesis for another 2-4 weeks.  BSW explained that CNA services can be requested again when physical therapy starts.  Francisco Hutchinson inquired about orders for DME that were placed with [facility] approximately one month ago.  BSW encouraged him to contact [facility] for an update.  BSW is closing case at this time due to multiple home modification resources and transportation resources being provided.  No other social work needs at this time.   Francisco Hutchinson, BSW Social Worker [phone]

## 2018-05-12 ENCOUNTER — Other Ambulatory Visit: Payer: Self-pay

## 2018-05-12 ENCOUNTER — Telehealth: Payer: Self-pay | Admitting: Family Medicine

## 2018-05-12 DIAGNOSIS — N492 Inflammatory disorders of scrotum: Secondary | ICD-10-CM | POA: Diagnosis not present

## 2018-05-12 DIAGNOSIS — Z794 Long term (current) use of insulin: Secondary | ICD-10-CM | POA: Diagnosis not present

## 2018-05-12 DIAGNOSIS — E1122 Type 2 diabetes mellitus with diabetic chronic kidney disease: Secondary | ICD-10-CM | POA: Diagnosis not present

## 2018-05-12 DIAGNOSIS — B20 Human immunodeficiency virus [HIV] disease: Secondary | ICD-10-CM | POA: Diagnosis not present

## 2018-05-12 DIAGNOSIS — N183 Chronic kidney disease, stage 3 (moderate): Secondary | ICD-10-CM | POA: Diagnosis not present

## 2018-05-12 DIAGNOSIS — Z89511 Acquired absence of right leg below knee: Secondary | ICD-10-CM | POA: Diagnosis not present

## 2018-05-12 DIAGNOSIS — Z8673 Personal history of transient ischemic attack (TIA), and cerebral infarction without residual deficits: Secondary | ICD-10-CM | POA: Diagnosis not present

## 2018-05-12 DIAGNOSIS — I129 Hypertensive chronic kidney disease with stage 1 through stage 4 chronic kidney disease, or unspecified chronic kidney disease: Secondary | ICD-10-CM | POA: Diagnosis not present

## 2018-05-12 DIAGNOSIS — E785 Hyperlipidemia, unspecified: Secondary | ICD-10-CM | POA: Diagnosis not present

## 2018-05-12 MED ORDER — COLLAGENASE 250 UNIT/GM EX OINT: 1.0000 "application " | TOPICAL_OINTMENT | Freq: Every day | CUTANEOUS | 1 refills | Status: DC

## 2018-05-12 NOTE — Telephone Encounter (Signed)
Called and gave verbal orders.  Santyl sent to pharmacy.

## 2018-05-12 NOTE — Telephone Encounter (Signed)
Copied from [place] [phone] [phone]. Topic: General - Other >> May 12, 2018  2:06 PM Margot Ables wrote: Reason for CRM: Francisco Hutchinson is requesting extension on SN visits for 1x week for 3 weeks. Also following up on request for Santyl for wound bed. Please call to advise. Ok to leave VM on secure line.  Santyl would go to [facility] in [facility] [address] [place] [phone] (Phone) [phone] (Fax)

## 2018-05-18 ENCOUNTER — Other Ambulatory Visit: Payer: Self-pay | Admitting: Family Medicine

## 2018-05-20 ENCOUNTER — Telehealth: Payer: Self-pay | Admitting: Family Medicine

## 2018-05-20 DIAGNOSIS — Z89511 Acquired absence of right leg below knee: Secondary | ICD-10-CM | POA: Diagnosis not present

## 2018-05-20 DIAGNOSIS — Z8673 Personal history of transient ischemic attack (TIA), and cerebral infarction without residual deficits: Secondary | ICD-10-CM | POA: Diagnosis not present

## 2018-05-20 DIAGNOSIS — E785 Hyperlipidemia, unspecified: Secondary | ICD-10-CM | POA: Diagnosis not present

## 2018-05-20 DIAGNOSIS — N492 Inflammatory disorders of scrotum: Secondary | ICD-10-CM | POA: Diagnosis not present

## 2018-05-20 DIAGNOSIS — E1122 Type 2 diabetes mellitus with diabetic chronic kidney disease: Secondary | ICD-10-CM | POA: Diagnosis not present

## 2018-05-20 DIAGNOSIS — Z794 Long term (current) use of insulin: Secondary | ICD-10-CM | POA: Diagnosis not present

## 2018-05-20 DIAGNOSIS — B20 Human immunodeficiency virus [HIV] disease: Secondary | ICD-10-CM | POA: Diagnosis not present

## 2018-05-20 DIAGNOSIS — N183 Chronic kidney disease, stage 3 (moderate): Secondary | ICD-10-CM | POA: Diagnosis not present

## 2018-05-20 DIAGNOSIS — I129 Hypertensive chronic kidney disease with stage 1 through stage 4 chronic kidney disease, or unspecified chronic kidney disease: Secondary | ICD-10-CM | POA: Diagnosis not present

## 2018-05-20 NOTE — Telephone Encounter (Signed)
See note

## 2018-05-20 NOTE — Telephone Encounter (Signed)
Copied from [place] [phone]. Topic: Quick Communication - Home Health Verbal Orders >> May 20, 2018  2:39 PM Gardiner Ramus wrote: Caller/Agency: [facility] Number: [phone] Requesting OT/PT/Skilled Nursing/Social Work: needs to change wound care orders Frequency:1x3

## 2018-05-21 NOTE — Telephone Encounter (Signed)
Spoke with Morey Hummingbird and gave verbal orders.

## 2018-05-25 ENCOUNTER — Ambulatory Visit: Payer: PPO | Admitting: Family Medicine

## 2018-05-27 DIAGNOSIS — N492 Inflammatory disorders of scrotum: Secondary | ICD-10-CM | POA: Diagnosis not present

## 2018-05-27 DIAGNOSIS — E1122 Type 2 diabetes mellitus with diabetic chronic kidney disease: Secondary | ICD-10-CM | POA: Diagnosis not present

## 2018-05-27 DIAGNOSIS — M6281 Muscle weakness (generalized): Secondary | ICD-10-CM | POA: Diagnosis not present

## 2018-05-27 DIAGNOSIS — L03115 Cellulitis of right lower limb: Secondary | ICD-10-CM | POA: Diagnosis not present

## 2018-05-27 DIAGNOSIS — I10 Essential (primary) hypertension: Secondary | ICD-10-CM | POA: Diagnosis not present

## 2018-05-27 DIAGNOSIS — E119 Type 2 diabetes mellitus without complications: Secondary | ICD-10-CM | POA: Diagnosis not present

## 2018-05-27 DIAGNOSIS — I639 Cerebral infarction, unspecified: Secondary | ICD-10-CM | POA: Diagnosis not present

## 2018-05-27 DIAGNOSIS — Z89511 Acquired absence of right leg below knee: Secondary | ICD-10-CM | POA: Diagnosis not present

## 2018-05-27 DIAGNOSIS — I509 Heart failure, unspecified: Secondary | ICD-10-CM | POA: Diagnosis not present

## 2018-05-27 DIAGNOSIS — N183 Chronic kidney disease, stage 3 (moderate): Secondary | ICD-10-CM | POA: Diagnosis not present

## 2018-05-28 ENCOUNTER — Other Ambulatory Visit: Payer: Self-pay | Admitting: *Deleted

## 2018-05-28 NOTE — Patient Outreach (Signed)
[place] [facility] Hutchinson) Care Management  05/28/2018  Francisco Hutchinson [facility] Oct 27, 1970 [id number]   RN Health Coach Initial Assessment  Referral Date:  05/03/2018 Referral Source:  Transfer from [place] Reason for Referral:  Continued Disease Management Insurance:  Health Team Advantage   Outreach Attempt:  Outreach attempt #1 to patient for introduction and initial telephone assessment. No answer and unable to leave voicemail message due to voicemail box being full.  Plan:  RN Health Coach will make another outreach attempt for completion of initial telephone assessment within the month of October.   Francisco Hutchinson [phone] [email or web address]

## 2018-06-02 ENCOUNTER — Telehealth: Payer: Self-pay | Admitting: Family Medicine

## 2018-06-02 NOTE — Telephone Encounter (Signed)
Copied from [place] [id number]. Topic: Quick Communication - Home Health Verbal Orders >> Jun 02, 2018  1:06 PM Rutherford Nail, [place] wrote: Caller/Agency: Morey Hummingbird with [facility] Number: [phone] Requesting OT/PT/Skilled Nursing/Social Work: Skilled Nursing-- Would care Frequency: 1x a week for 3 weeks

## 2018-06-02 NOTE — Telephone Encounter (Signed)
Copied from [place] [id number]. Topic: General - Other >> Jun 02, 2018  2:51 PM Waldemar Dickens, Valda Favia wrote: Marita Kansas from [facility] ( Infectious Disease Doctor) is calling in requesting most recent lab work to be faxed over for patient by tomorrow at 2pm  Fax# [phone] CB# [phone]

## 2018-06-02 NOTE — Telephone Encounter (Signed)
See note

## 2018-06-02 NOTE — Telephone Encounter (Signed)
Verbal orders given  

## 2018-06-03 LAB — BASIC METABOLIC PANEL
BUN: 29 — AB (ref 4–21)
CREATININE: 1.8 — AB (ref 0.6–1.3)
Glucose: 380
Potassium: 4.1 (ref 3.4–5.3)
Sodium: 138 (ref 137–147)

## 2018-06-03 LAB — LIPID PANEL
CHOLESTEROL: 103 (ref 0–200)
HDL: 31 — AB (ref 35–70)
LDL Cholesterol: 35
TRIGLYCERIDES: 187 — AB (ref 40–160)

## 2018-06-03 LAB — HEMOGLOBIN A1C: HEMOGLOBIN A1C: 9.2 — AB (ref 4.0–6.0)

## 2018-06-03 LAB — HEPATIC FUNCTION PANEL
ALK PHOS: 80 (ref 25–125)
ALT: 16 (ref 10–40)
AST: 18 (ref 14–40)
Bilirubin, Total: 0.5

## 2018-06-03 LAB — CBC AND DIFFERENTIAL
HCT: 35 — AB (ref 41–53)
HEMOGLOBIN: 11.7 — AB (ref 13.5–17.5)
NEUTROS ABS: 5
PLATELETS: 256 (ref 150–399)
WBC: 6.9

## 2018-06-03 NOTE — Telephone Encounter (Signed)
Labs have been faxed as requested

## 2018-06-08 ENCOUNTER — Ambulatory Visit: Payer: PPO | Admitting: Family Medicine

## 2018-06-08 DIAGNOSIS — Z89511 Acquired absence of right leg below knee: Secondary | ICD-10-CM | POA: Diagnosis not present

## 2018-06-08 DIAGNOSIS — M21372 Foot drop, left foot: Secondary | ICD-10-CM | POA: Diagnosis not present

## 2018-06-09 ENCOUNTER — Other Ambulatory Visit: Payer: Self-pay | Admitting: *Deleted

## 2018-06-09 NOTE — Patient Outreach (Signed)
[facility]) Care Management  06/09/2018  [place] Ang [facility] [birth year] [id number]   RN Health Coach Initial Assessment  Referral Date:  05/03/2018 Referral Source:  Transfer from [place] Reason for Referral:  Continued Disease Management Insurance:  Health Team Advantage   Outreach Attempt:  Outreach attempt #2 to patient for initial telephone assessment. No answer and unable to leave voicemail message due to voicemail box being full.  Plan:  RN Health Coach will make another outreach attempt within the month of November.  [place] [phone] Francisco [email or web address]

## 2018-06-10 DIAGNOSIS — N492 Inflammatory disorders of scrotum: Secondary | ICD-10-CM | POA: Diagnosis not present

## 2018-06-10 DIAGNOSIS — N183 Chronic kidney disease, stage 3 (moderate): Secondary | ICD-10-CM | POA: Diagnosis not present

## 2018-06-10 DIAGNOSIS — B20 Human immunodeficiency virus [HIV] disease: Secondary | ICD-10-CM

## 2018-06-10 DIAGNOSIS — I129 Hypertensive chronic kidney disease with stage 1 through stage 4 chronic kidney disease, or unspecified chronic kidney disease: Secondary | ICD-10-CM | POA: Diagnosis not present

## 2018-06-10 DIAGNOSIS — E785 Hyperlipidemia, unspecified: Secondary | ICD-10-CM

## 2018-06-10 DIAGNOSIS — Z794 Long term (current) use of insulin: Secondary | ICD-10-CM

## 2018-06-10 DIAGNOSIS — Z89511 Acquired absence of right leg below knee: Secondary | ICD-10-CM

## 2018-06-10 DIAGNOSIS — E1122 Type 2 diabetes mellitus with diabetic chronic kidney disease: Secondary | ICD-10-CM | POA: Diagnosis not present

## 2018-06-10 DIAGNOSIS — Z8673 Personal history of transient ischemic attack (TIA), and cerebral infarction without residual deficits: Secondary | ICD-10-CM

## 2018-06-14 ENCOUNTER — Telehealth: Payer: Self-pay | Admitting: Family Medicine

## 2018-06-14 NOTE — Telephone Encounter (Signed)
Copied from [place] [id number]. Topic: Quick Communication - See Telephone Encounter >> Jun 14, 2018  1:25 PM Ivar Drape wrote: CRM for notification. See Telephone encounter for: 06/14/18. Jewel Baize PT w/[facility] [phone] needs verbal orders for a pt evaluation for a prostatic leg training with the patient. He needs to start this week.

## 2018-06-14 NOTE — Telephone Encounter (Signed)
Verbal orders given  

## 2018-06-15 ENCOUNTER — Ambulatory Visit (INDEPENDENT_AMBULATORY_CARE_PROVIDER_SITE_OTHER): Payer: PPO | Admitting: Family Medicine

## 2018-06-15 ENCOUNTER — Encounter: Payer: Self-pay | Admitting: Family Medicine

## 2018-06-15 VITALS — BP 108/68 | HR 74 | Temp 98.4°F | Ht 70.0 in

## 2018-06-15 DIAGNOSIS — E785 Hyperlipidemia, unspecified: Secondary | ICD-10-CM

## 2018-06-15 DIAGNOSIS — N183 Chronic kidney disease, stage 3 unspecified: Secondary | ICD-10-CM

## 2018-06-15 DIAGNOSIS — E1169 Type 2 diabetes mellitus with other specified complication: Secondary | ICD-10-CM

## 2018-06-15 DIAGNOSIS — E1122 Type 2 diabetes mellitus with diabetic chronic kidney disease: Secondary | ICD-10-CM | POA: Diagnosis not present

## 2018-06-15 DIAGNOSIS — Z794 Long term (current) use of insulin: Secondary | ICD-10-CM

## 2018-06-15 DIAGNOSIS — B351 Tinea unguium: Secondary | ICD-10-CM | POA: Insufficient documentation

## 2018-06-15 DIAGNOSIS — I10 Essential (primary) hypertension: Secondary | ICD-10-CM

## 2018-06-15 DIAGNOSIS — F324 Major depressive disorder, single episode, in partial remission: Secondary | ICD-10-CM | POA: Diagnosis not present

## 2018-06-15 DIAGNOSIS — B20 Human immunodeficiency virus [HIV] disease: Secondary | ICD-10-CM

## 2018-06-15 DIAGNOSIS — E1159 Type 2 diabetes mellitus with other circulatory complications: Secondary | ICD-10-CM

## 2018-06-15 DIAGNOSIS — I152 Hypertension secondary to endocrine disorders: Secondary | ICD-10-CM

## 2018-06-15 MED ORDER — FENOFIBRATE MICRONIZED 43 MG PO CAPS: ORAL_CAPSULE | ORAL | 1 refills | Status: DC

## 2018-06-15 MED ORDER — METOPROLOL TARTRATE 100 MG PO TABS: 100.0000 mg | ORAL_TABLET | Freq: Two times a day (BID) | ORAL | 3 refills | Status: DC

## 2018-06-15 MED ORDER — AMLODIPINE BESYLATE 5 MG PO TABS: 5.0000 mg | ORAL_TABLET | Freq: Every day | ORAL | 3 refills | Status: DC

## 2018-06-15 MED ORDER — TRULICITY 1.5 MG/0.5ML ~~LOC~~ SOAJ: SUBCUTANEOUS | 3 refills | Status: DC

## 2018-06-15 MED ORDER — LANTUS SOLOSTAR 100 UNIT/ML ~~LOC~~ SOPN: 55.0000 [IU] | PEN_INJECTOR | Freq: Every day | SUBCUTANEOUS | 3 refills | Status: DC

## 2018-06-15 MED ORDER — ATORVASTATIN CALCIUM 40 MG PO TABS: 40.0000 mg | ORAL_TABLET | Freq: Every day | ORAL | 3 refills | Status: DC

## 2018-06-15 MED ORDER — SERTRALINE HCL 100 MG PO TABS: 100.0000 mg | ORAL_TABLET | Freq: Two times a day (BID) | ORAL | 3 refills | Status: DC

## 2018-06-15 MED ORDER — HYDROCHLOROTHIAZIDE 25 MG PO TABS: 25.0000 mg | ORAL_TABLET | Freq: Every day | ORAL | 1 refills | Status: DC

## 2018-06-15 NOTE — Patient Instructions (Signed)
It was very nice to see you today!  I will refill of your medications today.  No medication changes.  Please keep a close eye on your sugars and let me know if they continue to be low or if you start running high again.  We will send you to a podiatrist.  They will call you with this appointment.  Come back to see me in 2 to 3 months, or sooner as needed.   Take care, Dr [name]

## 2018-06-15 NOTE — Assessment & Plan Note (Signed)
Continue fibroid and atorvastatin.  Check lipid panel with next blood draw.

## 2018-06-15 NOTE — Assessment & Plan Note (Signed)
Referral placed to podiatry.

## 2018-06-15 NOTE — Progress Notes (Signed)
   Subjective:  Francisco Hutchinson is a 47 y.o. male who presents today with a chief complaint of T2DM.   HPI:  T2DM, chronic problem Last seen 2 months ago for this.  A1c was elevated to 10.  We increased his Lantus to 60 units daily.  He has done well on this dose however over the last week is noticed some sugars down into the 40s and 50s.  He has since backed off on his 1 dose back to 55 units daily.  He has done this for the past week and sugars have been reportedly well controlled in the 100s.  He has continue with Trulicity 1.5 mg weekly as well.  Onychomycosis/xerosis cutis Several year history.  This worsened on his left foot.  Requests referral to podiatry today.  Depression/Anxiety He recently increased his dose of Zoloft to 100 mg twice daily.  Symptoms seem to be well controlled at this dose.  He would like to continue with this for the time being.  Mood is stable.  HLD Currently on Lipitor 40 mg and fenofibrate 43 mg daily.  Tolerating both these well.  No reported myalgias.  HTN Currently on Norvasc 5 mg daily, metoprolol 100 mg twice daily, and HCTZ 25 mg daily.  Tolerating well.  No reported chest pain or shortness of breath.  ROS: Per HPI  PMH: He reports that he has never smoked. He has never used smokeless tobacco. He reports that he drinks about 2.0 standard drinks of alcohol per week. He reports that he does not use drugs.  Objective:  Physical Exam: BP 108/68 (BP Location: Left Arm, Patient Position: Sitting, Cuff Size: Normal)   Pulse 74   Temp 98.4 F (36.9 C) (Oral)   Ht 5\' 10"  (1.778 m)   SpO2 97%   BMI 33.00 kg/m   Gen: NAD, resting comfortably CV: RRR with no murmurs appreciated Pulm: NWOB, CTAB with no crackles, wheezes, or rhonchi MSK: Status post right BKA.  Left foot with severe xerosis cutis and onychomycosis.  Assessment/Plan:  Type 2 diabetes mellitus with diabetic chronic kidney disease (HCC) Continue Trulicity 1.5 mg weekly and Lantus 55  units daily.  It is too early to recheck his A1c.  We will continue checking CBGs at home.  Follow-up with me in 2 or 3 months for repeat A1c.  Onychomycosis Referral placed to podiatry.  Hypertension associated with diabetes ([place]) At goal.  Continue Norvasc 5 mg daily, metoprolol 100 mg twice daily, HCTZ 25 mg daily.  Hyperlipidemia associated with type 2 diabetes mellitus (HCC) Continue fibroid and atorvastatin.  Check lipid panel with next blood draw.  HIV disease ([place]) Managed by ID.  Continue ART.  Depression, major, single episode, in partial remission (HCC) Continue Zoloft 100 mg twice daily.   Algis Greenhouse. [name], MD 06/15/2018 11:47 AM

## 2018-06-15 NOTE — Assessment & Plan Note (Signed)
Continue Zoloft 100 mg twice daily.

## 2018-06-15 NOTE — Assessment & Plan Note (Signed)
Managed by ID.  Continue ART.

## 2018-06-15 NOTE — Assessment & Plan Note (Signed)
At goal.  Continue Norvasc 5 mg daily, metoprolol 100 mg twice daily, HCTZ 25 mg daily.

## 2018-06-15 NOTE — Assessment & Plan Note (Signed)
Continue Trulicity 1.5 mg weekly and Lantus 55 units daily.  It is too early to recheck his A1c.  We will continue checking CBGs at home.  Follow-up with me in 2 or 3 months for repeat A1c.

## 2018-06-17 ENCOUNTER — Encounter: Payer: Self-pay | Admitting: Physical Therapy

## 2018-06-18 ENCOUNTER — Telehealth (INDEPENDENT_AMBULATORY_CARE_PROVIDER_SITE_OTHER): Payer: Self-pay

## 2018-06-18 ENCOUNTER — Telehealth: Payer: Self-pay | Admitting: Family Medicine

## 2018-06-18 NOTE — Telephone Encounter (Signed)
Noted  

## 2018-06-18 NOTE — Telephone Encounter (Signed)
Copied from [place] [id number] [phone]. Topic: Quick Communication - See Telephone Encounter >> Jun 18, 2018  1:36 PM Blase Mess A wrote: CRM for notification. See Telephone encounter for: 06/18/18. Zenia Resides PT with [facility] is calling regarding the patient, Zenia Resides received orders for his right below knee amputation.  Services have been discontinued. There are 3 small areas for scabs areas that are unhealed.  The patient is not able to resume day training with prothesis. Callback # for Zenia Resides is [phone]

## 2018-06-18 NOTE — Telephone Encounter (Signed)
Allen with [facility] called to give Dr Sharol Given a heads up that ptaients PCP initiated prosthetic gait training for patient.  However, when they went out to initiate, patient has red skin, with 3 areas on incision that are not healed so they did not start gait training but recommended patient contact office to schedule follow up appt to have this evaluated.  Patient has been fitted for prosthesis and does have it.

## 2018-06-21 ENCOUNTER — Telehealth (INDEPENDENT_AMBULATORY_CARE_PROVIDER_SITE_OTHER): Payer: Self-pay | Admitting: Orthopedic Surgery

## 2018-06-21 NOTE — Telephone Encounter (Signed)
Called patient left message on voicemail to return call to schedule an appointment with [name] or [facility] for eval of prosthetic leg.

## 2018-06-21 NOTE — Telephone Encounter (Signed)
Can you please call pt and make appt to see [name] or [facility] for follow up please?

## 2018-07-02 ENCOUNTER — Other Ambulatory Visit: Payer: Self-pay | Admitting: *Deleted

## 2018-07-02 NOTE — Patient Outreach (Signed)
[facility]) Care Management  07/02/2018  Francisco Hutchinson [facility] May 27, 1971 [id number]   [place] Initial Assessment  Referral Date:05/03/2018 Referral Source:Transfer from [place] Reason for Referral:Continued Disease Management Insurance:Health Team Advantage   Outreach Attempt:  Outreach attempt #3 to patient for introduction call and initial telephone assessment. No answer. RN Health Coach left HIPAA compliant voicemail message along with contact information.  Plan:  RN Health Coach will make another outreach attempt within the month of December.  [place] [phone] [email or web address]

## 2018-07-06 ENCOUNTER — Telehealth: Payer: Self-pay | Admitting: Physical Therapy

## 2018-07-06 ENCOUNTER — Ambulatory Visit: Payer: PPO | Attending: Family Medicine | Admitting: Physical Therapy

## 2018-07-06 NOTE — Telephone Encounter (Signed)
Pt scheduled for wheelchair evaluation today with [facility].  Pt did not arrive for appointment.  Contacted pt and left message reminding pt of his appointment and advising pt to call back to clinic to reschedule.  Rico Junker, PT, DPT 07/06/18    2:10 PM

## 2018-07-28 ENCOUNTER — Ambulatory Visit: Payer: Self-pay | Admitting: *Deleted
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
## 2018-07-29 ENCOUNTER — Telehealth (INDEPENDENT_AMBULATORY_CARE_PROVIDER_SITE_OTHER): Payer: Self-pay | Admitting: Physician Assistant

## 2018-07-29 NOTE — Telephone Encounter (Signed)
Tried to call patient again to schedule an appointment for prosthetic leg eval    Both lines had rang busy

## 2018-07-29 NOTE — Telephone Encounter (Signed)
Could you please contact this patient and get him on the schedule to be seen?  We have a same day slot this afternoon or he can come in to be seen tomorrow.  Thank you!

## 2018-07-29 NOTE — Telephone Encounter (Signed)
See note

## 2018-07-30 ENCOUNTER — Ambulatory Visit (HOSPITAL_COMMUNITY): Payer: PPO

## 2018-07-30 ENCOUNTER — Ambulatory Visit (INDEPENDENT_AMBULATORY_CARE_PROVIDER_SITE_OTHER): Payer: PPO | Admitting: Family Medicine

## 2018-07-30 ENCOUNTER — Encounter (HOSPITAL_COMMUNITY): Payer: Self-pay

## 2018-07-30 ENCOUNTER — Ambulatory Visit (HOSPITAL_COMMUNITY)
Admission: RE | Admit: 2018-07-30 | Discharge: 2018-07-30 | Disposition: A | Discharge status: Home / Self Care | Payer: PPO | Source: Ambulatory Visit | Attending: Family Medicine | Admitting: Family Medicine

## 2018-07-30 ENCOUNTER — Encounter: Payer: Self-pay | Admitting: Family Medicine

## 2018-07-30 ENCOUNTER — Telehealth (INDEPENDENT_AMBULATORY_CARE_PROVIDER_SITE_OTHER): Payer: Self-pay | Admitting: Orthopedic Surgery

## 2018-07-30 ENCOUNTER — Ambulatory Visit (INDEPENDENT_AMBULATORY_CARE_PROVIDER_SITE_OTHER): Payer: PPO

## 2018-07-30 VITALS — BP 126/74 | HR 76 | Temp 97.7°F

## 2018-07-30 DIAGNOSIS — M545 Low back pain, unspecified: Secondary | ICD-10-CM

## 2018-07-30 DIAGNOSIS — M5126 Other intervertebral disc displacement, lumbar region: Secondary | ICD-10-CM | POA: Insufficient documentation

## 2018-07-30 DIAGNOSIS — S3992XA Unspecified injury of lower back, initial encounter: Secondary | ICD-10-CM | POA: Diagnosis not present

## 2018-07-30 DIAGNOSIS — L0291 Cutaneous abscess, unspecified: Secondary | ICD-10-CM | POA: Diagnosis not present

## 2018-07-30 DIAGNOSIS — M546 Pain in thoracic spine: Secondary | ICD-10-CM | POA: Diagnosis not present

## 2018-07-30 DIAGNOSIS — S299XXA Unspecified injury of thorax, initial encounter: Secondary | ICD-10-CM | POA: Diagnosis not present

## 2018-07-30 MED ORDER — DOXYCYCLINE HYCLATE 100 MG PO TABS: 100.0000 mg | ORAL_TABLET | Freq: Two times a day (BID) | ORAL | 0 refills | Status: DC

## 2018-07-30 MED ORDER — CYCLOBENZAPRINE HCL 10 MG PO TABS: 10.0000 mg | ORAL_TABLET | Freq: Three times a day (TID) | ORAL | 0 refills | Status: AC | PRN

## 2018-07-30 NOTE — Patient Instructions (Addendum)
It was very nice to see you today!  Please get your MRI ASAP.   Please try the flexeril for your back.  Please start the doxycycline.  Let me know if your symptoms worsen or do not improve.  Take care, Dr [name]

## 2018-07-30 NOTE — Telephone Encounter (Signed)
Called patient left message for him to call back to schedule an appointment with Shawn or [name] for Prosthetic eval
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
## 2018-07-31 ENCOUNTER — Other Ambulatory Visit: Payer: Self-pay | Admitting: Family Medicine

## 2018-08-01 ENCOUNTER — Ambulatory Visit (HOSPITAL_COMMUNITY): Payer: PPO

## 2018-08-02 ENCOUNTER — Other Ambulatory Visit: Payer: Self-pay | Admitting: Family Medicine

## 2018-08-02 ENCOUNTER — Other Ambulatory Visit: Payer: Self-pay | Admitting: *Deleted

## 2018-08-02 ENCOUNTER — Encounter: Payer: Self-pay | Admitting: *Deleted

## 2018-08-02 DIAGNOSIS — M5126 Other intervertebral disc displacement, lumbar region: Secondary | ICD-10-CM
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
## 2018-08-02 NOTE — Progress Notes (Signed)
Please inform patient of the following:  His MRI shows that he has a protruding disc in his low-mid back. He needs an urgent referral to neurosurgery - please place this order.  He needs to go to the ED if he has had any worsening pain or worsening bowel/bladder incontinence, weakness, numbness, etc.  Francisco Hutchinson Pain, MD 08/02/2018 1:01 PM

## 2018-08-19 DIAGNOSIS — M549 Dorsalgia, unspecified: Secondary | ICD-10-CM | POA: Diagnosis not present

## 2018-08-19 DIAGNOSIS — Z6835 Body mass index (BMI) 35.0-35.9, adult: Secondary | ICD-10-CM | POA: Diagnosis not present

## 2018-08-27 ENCOUNTER — Telehealth: Payer: Self-pay | Admitting: Family Medicine
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
## 2018-08-27 NOTE — Telephone Encounter (Signed)
See note

## 2018-08-27 NOTE — Telephone Encounter (Signed)
Noted  

## 2018-09-21 ENCOUNTER — Ambulatory Visit: Payer: PPO | Admitting: Family Medicine

## 2018-09-27 ENCOUNTER — Encounter: Payer: Self-pay | Admitting: Family Medicine

## 2018-09-27 ENCOUNTER — Ambulatory Visit (INDEPENDENT_AMBULATORY_CARE_PROVIDER_SITE_OTHER): Payer: Medicare HMO | Admitting: Family Medicine

## 2018-09-27 VITALS — BP 124/72 | HR 72 | Temp 98.1°F | Ht 70.0 in | Wt 230.0 lb

## 2018-09-27 DIAGNOSIS — Z794 Long term (current) use of insulin: Secondary | ICD-10-CM | POA: Diagnosis not present

## 2018-09-27 DIAGNOSIS — N183 Chronic kidney disease, stage 3 (moderate): Secondary | ICD-10-CM | POA: Diagnosis not present

## 2018-09-27 DIAGNOSIS — L98421 Non-pressure chronic ulcer of back limited to breakdown of skin: Secondary | ICD-10-CM | POA: Diagnosis not present

## 2018-09-27 DIAGNOSIS — Z89511 Acquired absence of right leg below knee: Secondary | ICD-10-CM

## 2018-09-27 DIAGNOSIS — E1169 Type 2 diabetes mellitus with other specified complication: Secondary | ICD-10-CM

## 2018-09-27 DIAGNOSIS — B20 Human immunodeficiency virus [HIV] disease: Secondary | ICD-10-CM

## 2018-09-27 DIAGNOSIS — I10 Essential (primary) hypertension: Secondary | ICD-10-CM

## 2018-09-27 DIAGNOSIS — E11621 Type 2 diabetes mellitus with foot ulcer: Secondary | ICD-10-CM

## 2018-09-27 DIAGNOSIS — E1159 Type 2 diabetes mellitus with other circulatory complications: Secondary | ICD-10-CM

## 2018-09-27 DIAGNOSIS — L97529 Non-pressure chronic ulcer of other part of left foot with unspecified severity: Secondary | ICD-10-CM

## 2018-09-27 DIAGNOSIS — Z6833 Body mass index (BMI) 33.0-33.9, adult: Secondary | ICD-10-CM

## 2018-09-27 DIAGNOSIS — E1122 Type 2 diabetes mellitus with diabetic chronic kidney disease: Secondary | ICD-10-CM

## 2018-09-27 DIAGNOSIS — E785 Hyperlipidemia, unspecified: Secondary | ICD-10-CM

## 2018-09-27 LAB — POCT GLYCOSYLATED HEMOGLOBIN (HGB A1C): Hemoglobin A1C: 8.9 % — AB (ref 4.0–5.6)

## 2018-09-27 NOTE — Progress Notes (Signed)
   Chief Complaint:  Francisco Hutchinson is a 48 y.o. male who presents today with a chief complaint of T2DM.   Assessment/Plan:  Type 2 diabetes mellitus with diabetic chronic kidney disease (HCC) A1c improved to 8.9.  Continue Lantus 50 units daily and Trulicity 1.5 mg weekly.  Would not increase dose of Lantus given he has had a few lows.  Follow-up with me in 3 to 6 months.  If A1c continues to be up above goal despite adequate fasting sugars, would consider adding rapid acting insulin with largest meal of the day.  Sacral ulcer ([place]) Stable.  Will place referral to wound care.  S/P unilateral BKA (below knee amputation), right (HCC) Stable with prosthetic.  Hypertension associated with diabetes ([place]) Stable.  Continue Norvasc 5 mg daily, HCTZ 25 mg daily, and metoprolol tartrate 100 mg twice daily.  HIV disease ([name]) Stable.  Continue management per ID.  Diabetic ulcer of left foot associated with type 2 diabetes mellitus ([place]) Place referral to wound care.  Hyperlipidemia associated with type 2 diabetes mellitus (HCC) Continue Lipitor 40 mg daily and fenofibrate.     Subjective:  HPI:  Mother is concerned about ulcer on top of his right foot.  This is been present for several weeks and seems to be worsening.  Mother also notes that he will frequently take the area.  No pain to the area however patient has very little sensation to the area.  No drainage.  No redness.  His stable, chronic medical conditions are outlined below:  # T2DM - On lantus 65K daily, trulicity 1.5mg  weekly.  Tolerating well. - Home Sugars: 90s and low 100s.  He has had a few lows into the upper 50s. - ROS: No polyuria or polydipsia.   # Essential Hypertension - On Norvasc 5 mg daily, HCTZ 25 mg daily, metoprolol tartrate 100 mg twice daily.  Tolerating all well without side effects - ROS: No reported chest pain or shortness of breath  # Dyslipidemia - On atorvastatin 40mg  daily and tolerating  well.   % HIV Disease - On ART via ID  ROS: Per HPI  PMH: He reports that he has never smoked. He has never used smokeless tobacco. He reports current alcohol use of about 2.0 standard drinks of alcohol per week. He reports that he does not use drugs.      Objective:  Physical Exam: BP 124/72 (BP Location: Left Arm, Patient Position: Sitting, Cuff Size: Normal)   Pulse 72   Temp 98.1 F (36.7 C) (Oral)   Ht 5\' 10"  (1.778 m)   Wt 230 lb (104.3 kg)   SpO2 96%   BMI 33.00 kg/m   Gen: NAD, resting comfortably CV: Regular rate and rhythm with no murmurs appreciated Pulm: Normal work of breathing, clear to auscultation bilaterally with no crackles, wheezes, or rhonchi MSK:  - Left Foot: Severe dry skin with cracking noted.  Approximately 1 cm eschar on dorsal aspect right foot with small amount of surrounding erythema.  No pain.  No fluctuance.  No drainage.  Results for orders placed or performed in visit on 09/27/18 (from the past 24 hour(s))  POCT glycosylated hemoglobin (Hb A1C)     Status: Abnormal   Collection Time: 09/27/18  3:05 PM  Result Value Ref Range   Hemoglobin A1C 8.9 (A) 4.0 - 5.6 %        Francisco Hutchinson M. [name], MD 09/27/2018 3:42 PM

## 2018-09-27 NOTE — Assessment & Plan Note (Signed)
Place referral to wound care.

## 2018-09-27 NOTE — Assessment & Plan Note (Signed)
Stable with prosthetic.

## 2018-09-27 NOTE — Assessment & Plan Note (Signed)
Stable.  Will place referral to wound care.

## 2018-09-27 NOTE — Patient Instructions (Signed)
It was very nice to see you today!  No medication changes today.  I will place a referral to a wound care center near your home.  Come back to see me in 3-6 months or sooner as needed.   Take care, Dr [name]

## 2018-09-27 NOTE — Assessment & Plan Note (Signed)
Stable.  Continue management per ID.

## 2018-09-27 NOTE — Assessment & Plan Note (Signed)
Continue Lipitor 40 mg daily and fenofibrate.

## 2018-09-27 NOTE — Assessment & Plan Note (Signed)
A1c improved to 8.9.  Continue Lantus 50 units daily and Trulicity 1.5 mg weekly.  Would not increase dose of Lantus given he has had a few lows.  Follow-up with me in 3 to 6 months.  If A1c continues to be up above goal despite adequate fasting sugars, would consider adding rapid acting insulin with largest meal of the day.

## 2018-09-27 NOTE — Assessment & Plan Note (Signed)
Stable.  Continue Norvasc 5 mg daily, HCTZ 25 mg daily, and metoprolol tartrate 100 mg twice daily. 

## 2018-09-28 ENCOUNTER — Ambulatory Visit: Payer: PPO

## 2018-10-07 ENCOUNTER — Telehealth: Payer: Self-pay | Admitting: Family Medicine

## 2018-10-07 DIAGNOSIS — Z794 Long term (current) use of insulin: Secondary | ICD-10-CM | POA: Diagnosis not present

## 2018-10-07 DIAGNOSIS — Z21 Asymptomatic human immunodeficiency virus [HIV] infection status: Secondary | ICD-10-CM | POA: Diagnosis not present

## 2018-10-07 DIAGNOSIS — L97522 Non-pressure chronic ulcer of other part of left foot with fat layer exposed: Secondary | ICD-10-CM | POA: Diagnosis not present

## 2018-10-07 DIAGNOSIS — L97525 Non-pressure chronic ulcer of other part of left foot with muscle involvement without evidence of necrosis: Secondary | ICD-10-CM | POA: Diagnosis not present

## 2018-10-07 DIAGNOSIS — I96 Gangrene, not elsewhere classified: Secondary | ICD-10-CM | POA: Diagnosis not present

## 2018-10-07 DIAGNOSIS — I10 Essential (primary) hypertension: Secondary | ICD-10-CM | POA: Diagnosis not present

## 2018-10-07 DIAGNOSIS — E11621 Type 2 diabetes mellitus with foot ulcer: Secondary | ICD-10-CM | POA: Diagnosis not present

## 2018-10-07 DIAGNOSIS — E1152 Type 2 diabetes mellitus with diabetic peripheral angiopathy with gangrene: Secondary | ICD-10-CM | POA: Diagnosis not present

## 2018-10-07 NOTE — Telephone Encounter (Signed)
Paper work has been faxed

## 2018-10-07 NOTE — Telephone Encounter (Signed)
See note  Copied from [place] [phone]. Topic: General - Other >> Oct 07, 2018  8:56 AM Carolyn Stare wrote:  Roselyn Reef with [facility] call to say she need pt current medication list and his last hemoglobin and a1c , pt was referred by Dr [name]   Fax [phone]  Phone [phone]  [phone]

## 2018-10-13 DIAGNOSIS — E11621 Type 2 diabetes mellitus with foot ulcer: Secondary | ICD-10-CM | POA: Diagnosis not present

## 2018-10-13 DIAGNOSIS — Z872 Personal history of diseases of the skin and subcutaneous tissue: Secondary | ICD-10-CM | POA: Diagnosis not present

## 2018-10-13 DIAGNOSIS — Z8631 Personal history of diabetic foot ulcer: Secondary | ICD-10-CM | POA: Diagnosis not present

## 2018-10-13 DIAGNOSIS — L97529 Non-pressure chronic ulcer of other part of left foot with unspecified severity: Secondary | ICD-10-CM | POA: Diagnosis not present

## 2018-10-13 DIAGNOSIS — Z09 Encounter for follow-up examination after completed treatment for conditions other than malignant neoplasm: Secondary | ICD-10-CM | POA: Diagnosis not present

## 2018-10-14 DIAGNOSIS — L97509 Non-pressure chronic ulcer of other part of unspecified foot with unspecified severity: Secondary | ICD-10-CM | POA: Diagnosis not present

## 2018-10-14 DIAGNOSIS — E11621 Type 2 diabetes mellitus with foot ulcer: Secondary | ICD-10-CM | POA: Diagnosis not present

## 2018-10-14 DIAGNOSIS — Z89511 Acquired absence of right leg below knee: Secondary | ICD-10-CM | POA: Diagnosis not present

## 2018-10-27 ENCOUNTER — Other Ambulatory Visit: Payer: Self-pay | Admitting: *Deleted

## 2018-10-27 NOTE — Patient Outreach (Signed)
[facility]) Care Management  10/27/2018  [place] 1971/08/01 [id number]   RN Health Coach Quarterly Update  Referral Date:05/03/2018 Referral Source:Transfer from [facility] Nurse Reason for Referral:Continued Disease Management Insurance:[employer]   Outreach Attempt:  Outreach attempt #1 to patient for quarterly follow up. No answer. RN Health Coach left HIPAA compliant voicemail message along with contact information.  Plan:  RN Health Coach will make another outreach attempt within the month of April if no return call back from patient.  [place] Coach [phone] [email or web address]

## 2018-11-13 ENCOUNTER — Other Ambulatory Visit: Payer: Self-pay | Admitting: Family Medicine

## 2018-11-15 ENCOUNTER — Other Ambulatory Visit: Payer: Self-pay | Admitting: *Deleted

## 2018-11-15 NOTE — Patient Outreach (Signed)
[facility] [facility]) Care Management  11/15/2018  Francisco Hutchinson [birth year] [id number]   RN Health Coach Quarterly Outreach  Referral Date:05/03/2018 Referral Source:Transfer from [facility] Nurse Reason for Referral:Continued Disease Management Insurance:[employer]   Outreach Attempt:  Outreach attempt #2 to patient for follow up.  Male answered and stated she was not at home with patient at this time.  Requested call back when patient available.   Plan: RN Health Coach will make another telephone outreach to patient within the month of April.  [place] [phone] [name]

## 2018-11-24 ENCOUNTER — Other Ambulatory Visit: Payer: Self-pay | Admitting: *Deleted

## 2018-11-24 NOTE — Patient Outreach (Signed)
Francisco Hutchinson [facility]) Care Management  11/24/2018  [place] [birth year] [id number]   RN Health Coach Quarterly Outreach  Referral Date:05/03/2018 Referral Source:Transfer from [facility] Nurse Reason for Referral:Continued Disease Management Insurance:[employer]   Outreach Attempt:  Outreach attempt #3 to patient for follow up. No answer. RN Health Coach left HIPAA compliant voicemail message along with contact information.  Plan:  RN Health Coach will make another outreach attempt within the month of May.  RN Health Coach will send patient Unsuccessful Letter.  [place] [phone] [email or web address]

## 2018-12-01 ENCOUNTER — Telehealth: Payer: Self-pay | Admitting: Family Medicine

## 2018-12-01 ENCOUNTER — Other Ambulatory Visit: Payer: Self-pay | Admitting: Family Medicine

## 2018-12-01 NOTE — Telephone Encounter (Signed)
Patient needs a new fenofibrate prescription sent in and it needs to be tablets and not capsules like previously sent because of insurance

## 2018-12-01 NOTE — Telephone Encounter (Signed)
See note

## 2018-12-02 ENCOUNTER — Other Ambulatory Visit: Payer: Self-pay

## 2018-12-02 MED ORDER — FENOFIBRATE 48 MG PO TABS: 48.0000 mg | ORAL_TABLET | Freq: Every day | ORAL | 1 refills | Status: DC

## 2018-12-02 NOTE — Telephone Encounter (Signed)
Rx has been sent to pharmacy

## 2018-12-17 ENCOUNTER — Other Ambulatory Visit: Payer: Self-pay | Admitting: Family Medicine

## 2018-12-21 ENCOUNTER — Other Ambulatory Visit: Payer: Self-pay | Admitting: *Deleted

## 2018-12-21 NOTE — Patient Outreach (Addendum)
[facility]) Care Management  12/21/2018  [place] Dec 14, 1970 [id number]   [place]  Referral Date:05/03/2018 Referral Source:Transfer from [place] Reason for Referral:Continued Disease Management Insurance:[employer] Medicare  Addendum:  Unsuccessful outreach attempt to patient this afternoon per mothers request.  HIPAA compliant voice message left on voicemail.  RN Health Coach will send another Unsuccessful Outreach Letter and make final outreach attempt within the next 10 business days.  Outreach Attempt:  Outreach attempt #4 to patient for follow up.  Male answered and stated she was patient's mother.  Mother states patient is still in the bed and requested a call back when patient awake.  Plan: RN Health Coach will attempt another telephone outreach within the month of May.  [place] Coach [phone] Bridget [email or web address]

## 2019-01-04 ENCOUNTER — Other Ambulatory Visit: Payer: Self-pay | Admitting: *Deleted

## 2019-01-04 NOTE — Patient Outreach (Signed)
[facility]) Care Management  01/04/2019  [place] 10-09-1970 [id number]   [place]  Referral Date:05/03/2018 Referral Source:Transfer from [place] Reason for Referral:Continued Disease Management Insurance:[employer] Medicare   Outreach Attempt:  Multiple attempts to establish contact with patient without success. No response from letter mailed to patient. Case is being closed at this time.   Plan: RN Health Coach will close case at this time due to inability to maintain contact with patient. RN Health Coach will send MD case closure letter. RN Health Coach will send patient case closure letter.   [place] [phone] [email or web address]

## 2019-01-20 DIAGNOSIS — B20 Human immunodeficiency virus [HIV] disease: Secondary | ICD-10-CM | POA: Diagnosis not present

## 2019-01-20 LAB — BASIC METABOLIC PANEL
BUN: 35 — AB (ref 4–21)
Creatinine: 2.2 — AB (ref 0.6–1.3)
Glucose: 250
Potassium: 4.3 (ref 3.4–5.3)
Sodium: 138 (ref 137–147)

## 2019-01-20 LAB — HEPATIC FUNCTION PANEL
ALT: 15 (ref 10–40)
AST: 18 (ref 14–40)
Alkaline Phosphatase: 119 (ref 25–125)
Bilirubin, Total: 0.5

## 2019-01-20 LAB — CBC AND DIFFERENTIAL
HCT: 38 — AB (ref 41–53)
Hemoglobin: 12.7 — AB (ref 13.5–17.5)
Platelets: 283 (ref 150–399)
WBC: 10

## 2019-01-20 LAB — HEMOGLOBIN A1C: Hemoglobin A1C: 9.7

## 2019-01-20 LAB — LIPID PANEL
Cholesterol: 99 (ref 0–200)
HDL: 21 — AB (ref 35–70)
LDL Cholesterol: 33
Triglycerides: 223 — AB (ref 40–160)

## 2019-01-20 LAB — HIV ANTIBODY (ROUTINE TESTING W REFLEX): HIV 1&2 Ab, 4th Generation: NOT DETECTED

## 2019-01-25 ENCOUNTER — Ambulatory Visit: Payer: Medicare HMO | Admitting: Family Medicine

## 2019-01-28 ENCOUNTER — Ambulatory Visit: Payer: Medicare HMO | Admitting: Family Medicine

## 2019-02-08 ENCOUNTER — Encounter: Payer: Self-pay | Admitting: Family Medicine

## 2019-02-10 ENCOUNTER — Ambulatory Visit: Payer: Medicare HMO | Admitting: Family Medicine

## 2019-02-12 ENCOUNTER — Other Ambulatory Visit: Payer: Self-pay | Admitting: Family Medicine

## 2019-02-24 ENCOUNTER — Ambulatory Visit (INDEPENDENT_AMBULATORY_CARE_PROVIDER_SITE_OTHER): Payer: Medicare HMO | Admitting: Family Medicine

## 2019-02-24 ENCOUNTER — Encounter: Payer: Self-pay | Admitting: Family Medicine

## 2019-02-24 ENCOUNTER — Other Ambulatory Visit: Payer: Self-pay

## 2019-02-24 VITALS — BP 117/78 | HR 77 | Temp 98.2°F

## 2019-02-24 DIAGNOSIS — N183 Chronic kidney disease, stage 3 unspecified: Secondary | ICD-10-CM

## 2019-02-24 DIAGNOSIS — Z794 Long term (current) use of insulin: Secondary | ICD-10-CM | POA: Diagnosis not present

## 2019-02-24 DIAGNOSIS — I10 Essential (primary) hypertension: Secondary | ICD-10-CM | POA: Diagnosis not present

## 2019-02-24 DIAGNOSIS — E1122 Type 2 diabetes mellitus with diabetic chronic kidney disease: Secondary | ICD-10-CM

## 2019-02-24 DIAGNOSIS — E1159 Type 2 diabetes mellitus with other circulatory complications: Secondary | ICD-10-CM

## 2019-02-24 LAB — BASIC METABOLIC PANEL
BUN: 39 mg/dL — ABNORMAL HIGH (ref 6–23)
CO2: 27 mEq/L (ref 19–32)
Calcium: 8.2 mg/dL — ABNORMAL LOW (ref 8.4–10.5)
Chloride: 102 mEq/L (ref 96–112)
Creatinine, Ser: 1.77 mg/dL — ABNORMAL HIGH (ref 0.40–1.50)
GFR: 41.63 mL/min — ABNORMAL LOW (ref >60.00)
Glucose, Bld: 250 mg/dL — ABNORMAL HIGH (ref 70–99)
Potassium: 3.9 mEq/L (ref 3.5–5.1)
Sodium: 137 mEq/L (ref 135–145)

## 2019-02-24 MED ORDER — JARDIANCE 10 MG PO TABS: 10.0000 mg | ORAL_TABLET | Freq: Every day | ORAL | 3 refills | Status: DC

## 2019-02-24 NOTE — Progress Notes (Signed)
   Chief Complaint:  Francisco Hutchinson is a 48 y.o. male who presents today with a chief complaint of T2DM follow up.   Assessment/Plan:  Hypertension associated with diabetes ([place]) Stable. Continue norvasc '5mg'$  daily, HCTZ 25 mg daily, and metoprolol tartrate 100 mg twice daily.  Type 2 diabetes mellitus with diabetic chronic kidney disease (HCC) Last A1c 9.7.  Very difficult to titrate as he does not have blood sugar logs and his reported sugars are in the low 100s with a few reported lows into the 50s.  We will continue Lantus 52 units daily and Trulicity 1.5 mg weekly.  We will also add on Jardiance-we will check be met today to make sure kidney function is suitable for this.  We will follow-up in 3 months.  If A1c continues to be above goal, will need to add rapid acting insulin with largest meal of the day.  CKD (chronic kidney disease), stage III ([place]) Check treatment.     Subjective:  HPI:  His stable, chronic medical conditions are outlined below:  # T2DM - On lantus 70V daily, trulicity 1.'5mg'$  weekly.  Tolerating well. - Home Sugars: low 100s.   - ROS: No polyuria or polydipsia.   # Essential Hypertension - On Norvasc 5 mg daily, HCTZ 25 mg daily, metoprolol tartrate 100 mg twice daily.  Tolerating all well without side effects - ROS: No reported chest pain or shortness of breath  % CKD Stage 3  ROS: Per HPI  PMH: He reports that he has never smoked. He has never used smokeless tobacco. He reports current alcohol use of about 2.0 standard drinks of alcohol per week. He reports that he does not use drugs.      Objective:  Physical Exam: BP 117/78 (BP Location: Left Arm, Patient Position: Sitting, Cuff Size: Normal)   Pulse 77   Temp 98.2 F (36.8 C) (Oral)   SpO2 97%   Gen: NAD, resting comfortably CV: Regular rate and rhythm with no murmurs appreciated Pulm: Normal work of breathing, clear to auscultation bilaterally with no crackles, wheezes, or rhonchi      [name], MD 02/24/2019 1:58 PM

## 2019-02-24 NOTE — Assessment & Plan Note (Signed)
Last A1c 9.7.  Very difficult to titrate as he does not have blood sugar logs and his reported sugars are in the low 100s with a few reported lows into the 50s.  We will continue Lantus 52 units daily and Trulicity 1.5 mg weekly.  We will also add on Jardiance-we will check be met today to make sure kidney function is suitable for this.  We will follow-up in 3 months.  If A1c continues to be above goal, will need to add rapid acting insulin with largest meal of the day.

## 2019-02-24 NOTE — Patient Instructions (Signed)
It was very nice to see you today!  I am so sorry to hear about your loss.   Please start jardiance once daily.  We will check blood work today.  Come back to see me in 3 months, or sooner if needed.   Take care, Dr [name]  Please try these tips to maintain a healthy lifestyle:   Eat at least 3 REAL meals and 1-2 snacks per day.  Aim for no more than 5 hours between eating.  If you eat breakfast, please do so within one hour of getting up.    Obtain twice as many fruits/vegetables as protein or carbohydrate foods for both lunch and dinner. (Half of each meal should be fruits/vegetables, one quarter protein, and one quarter starchy carbs)   Cut down on sweet beverages. This includes juice, soda, and sweet tea.    Exercise at least 150 minutes every week.

## 2019-02-24 NOTE — Assessment & Plan Note (Signed)
Check treatment.

## 2019-02-24 NOTE — Assessment & Plan Note (Signed)
Stable. Continue norvasc 5mg  daily, HCTZ 25 mg daily, and metoprolol tartrate 100 mg twice daily.

## 2019-02-25 ENCOUNTER — Telehealth: Payer: Self-pay | Admitting: Family Medicine

## 2019-02-25 NOTE — Telephone Encounter (Signed)
See note

## 2019-02-25 NOTE — Progress Notes (Signed)
Please inform patient of the following:  Renal function has improved. We should continue to monitor periodically in 3-6 months.  Would like for him to continue the jardiance and we will see him back in 3 months.   Algis Greenhouse. [name], MD 02/25/2019 8:50 AM

## 2019-02-25 NOTE — Telephone Encounter (Signed)
Mother returning call regarding lab results

## 2019-03-01 ENCOUNTER — Telehealth: Payer: Self-pay | Admitting: Family Medicine

## 2019-03-01 ENCOUNTER — Other Ambulatory Visit: Payer: Self-pay

## 2019-03-01 MED ORDER — LANTUS SOLOSTAR 100 UNIT/ML ~~LOC~~ SOPN: 55.0000 [IU] | PEN_INJECTOR | Freq: Every day | SUBCUTANEOUS | 3 refills | Status: DC

## 2019-03-01 NOTE — Telephone Encounter (Signed)
Medication Refill - Medication: LANTUS SOLOSTAR 100 UNIT/ML Solostar Pen [[id number]]    Has the patient contacted their pharmacy? Yes.  The patient has no more refills and is on the last pen. The patient needs this prescription urgently  (Agent: If yes, when and what did the pharmacy advise?)  Preferred Pharmacy (with phone number or street name):  [facility], [place] [phone] (Phone) [phone] (Fax)     Agent: Please be advised that RX refills may take up to 3 business days. We ask that you follow-up with your pharmacy.

## 2019-03-01 NOTE — Telephone Encounter (Signed)
Left voice message for patient to call clinic.  

## 2019-03-01 NOTE — Telephone Encounter (Signed)
Rx sent 

## 2019-03-02 ENCOUNTER — Other Ambulatory Visit: Payer: Self-pay

## 2019-03-02 MED ORDER — LANTUS SOLOSTAR 100 UNIT/ML ~~LOC~~ SOPN: 55.0000 [IU] | PEN_INJECTOR | Freq: Every day | SUBCUTANEOUS | 3 refills | Status: DC

## 2019-04-01 ENCOUNTER — Other Ambulatory Visit: Payer: Self-pay

## 2019-04-01 MED ORDER — TRULICITY 1.5 MG/0.5ML ~~LOC~~ SOAJ: SUBCUTANEOUS | 3 refills | Status: DC

## 2019-04-13 ENCOUNTER — Telehealth: Payer: Self-pay | Admitting: Family Medicine

## 2019-04-13 NOTE — Telephone Encounter (Signed)
Requesting nausea medication, been dealing with nausea since last week.  ondansetron (ZOFRAN) injection 8 mg   [id number] [place], [place] - [address]  [address] [place] [address]  Phone: [phone] Fax: [phone]

## 2019-04-13 NOTE — Telephone Encounter (Signed)
Left VM for pt to CB to further assess c/o nausea.  Med not on profile.

## 2019-04-14 NOTE — Telephone Encounter (Signed)
Spoke to pt mother and informed her that Dr.Parker never prescribed Rx. Pt mother was advised to schedule pt for a visit for sx. Pt mother declined and stated pt has enough right now and declined sooner appointment. Pt has been scheduled I advised if pt could wait that long and mother stated yes. Mother advised if pt sx worsen or persist to take pt to local UC or ED

## 2019-04-14 NOTE — Telephone Encounter (Signed)
Left voice message for patient to call clinic.  

## 2019-04-21 ENCOUNTER — Telehealth: Payer: Medicare HMO | Admitting: Family Medicine

## 2019-04-28 ENCOUNTER — Telehealth: Payer: Medicare HMO | Admitting: Family Medicine

## 2019-05-19 DIAGNOSIS — Z23 Encounter for immunization: Secondary | ICD-10-CM | POA: Diagnosis not present

## 2019-05-19 DIAGNOSIS — E119 Type 2 diabetes mellitus without complications: Secondary | ICD-10-CM | POA: Diagnosis not present

## 2019-05-19 DIAGNOSIS — E785 Hyperlipidemia, unspecified: Secondary | ICD-10-CM | POA: Diagnosis not present

## 2019-05-19 DIAGNOSIS — B20 Human immunodeficiency virus [HIV] disease: Secondary | ICD-10-CM | POA: Diagnosis not present

## 2019-05-19 DIAGNOSIS — Z8673 Personal history of transient ischemic attack (TIA), and cerebral infarction without residual deficits: Secondary | ICD-10-CM | POA: Diagnosis not present

## 2019-05-19 DIAGNOSIS — N189 Chronic kidney disease, unspecified: Secondary | ICD-10-CM | POA: Diagnosis not present

## 2019-05-19 DIAGNOSIS — I129 Hypertensive chronic kidney disease with stage 1 through stage 4 chronic kidney disease, or unspecified chronic kidney disease: Secondary | ICD-10-CM | POA: Diagnosis not present

## 2019-05-19 DIAGNOSIS — F329 Major depressive disorder, single episode, unspecified: Secondary | ICD-10-CM | POA: Diagnosis not present

## 2019-05-19 DIAGNOSIS — I4891 Unspecified atrial fibrillation: Secondary | ICD-10-CM | POA: Diagnosis not present

## 2019-05-20 ENCOUNTER — Other Ambulatory Visit: Payer: Self-pay | Admitting: Family Medicine

## 2019-05-26 ENCOUNTER — Other Ambulatory Visit: Payer: Self-pay | Admitting: Family Medicine

## 2019-05-26 MED ORDER — JARDIANCE 10 MG PO TABS: 10.0000 mg | ORAL_TABLET | Freq: Every day | ORAL | 3 refills | Status: DC

## 2019-05-26 NOTE — Telephone Encounter (Signed)
Medication Refill - Medication: empagliflozin (JARDIANCE) 10 MG TABS tablet     Preferred Pharmacy (with phone number or street name):  [facility] [address] [facility], [place] [address]  Phone: [phone] Fax: [phone]     Agent: Please be advised that RX refills may take up to 3 business days. We ask that you follow-up with your pharmacy.

## 2019-05-31 ENCOUNTER — Other Ambulatory Visit: Payer: Self-pay

## 2019-05-31 ENCOUNTER — Ambulatory Visit: Payer: Medicare HMO | Admitting: Family Medicine

## 2019-05-31 MED ORDER — AMLODIPINE BESYLATE 5 MG PO TABS: 5.0000 mg | ORAL_TABLET | Freq: Every day | ORAL | 3 refills | Status: DC

## 2019-05-31 MED ORDER — SERTRALINE HCL 100 MG PO TABS: 100.0000 mg | ORAL_TABLET | Freq: Two times a day (BID) | ORAL | 3 refills | Status: DC

## 2019-05-31 MED ORDER — ATORVASTATIN CALCIUM 40 MG PO TABS: 40.0000 mg | ORAL_TABLET | Freq: Every day | ORAL | 3 refills | Status: DC

## 2019-05-31 MED ORDER — HYDROCHLOROTHIAZIDE 25 MG PO TABS: 25.0000 mg | ORAL_TABLET | Freq: Every day | ORAL | 1 refills | Status: DC

## 2019-05-31 MED ORDER — METOPROLOL TARTRATE 100 MG PO TABS: 100.0000 mg | ORAL_TABLET | Freq: Two times a day (BID) | ORAL | 1 refills | Status: DC

## 2019-06-21 ENCOUNTER — Telehealth: Payer: Self-pay | Admitting: Family Medicine

## 2019-06-21 NOTE — Telephone Encounter (Signed)
I left a message asking the patient to call and schedule Medicare AWV with Courtney ([facility]).  If patient calls back, please schedule Medicare Wellness Visit (initial) at next available opening.  VDM (Dee-Dee) 

## 2019-06-23 ENCOUNTER — Other Ambulatory Visit: Payer: Self-pay | Admitting: Family Medicine

## 2019-06-27 ENCOUNTER — Other Ambulatory Visit: Payer: Self-pay | Admitting: Family Medicine

## 2019-06-27 NOTE — Telephone Encounter (Addendum)
Patient would like a refill on his fenofibrate (TRICOR) 48 MG tablet  medication and have it sent to his preferred pharmacy [facility], [place]

## 2019-06-27 NOTE — Telephone Encounter (Signed)
Requested medication (s) are due for refill today: yes  Requested medication (s) are on the active medication list: yes  Last refill:  12/02/2018  Future visit scheduled: yes  Notes to clinic:  Patient has appointment schedule 06/28/2019   Requested Prescriptions  Pending Prescriptions Disp Refills   fenofibrate (TRICOR) 48 MG tablet 90 tablet 1    Sig: Take 1 tablet (48 mg total) by mouth daily.     Cardiovascular:  Antilipid - Fibric Acid Derivatives Failed - 06/27/2019  9:35 AM      Failed - HDL in normal range and within 360 days    HDL  Date Value Ref Range Status  01/20/2019 21 (A) 35 - 70 Final         Failed - Triglycerides in normal range and within 360 days    Triglycerides  Date Value Ref Range Status  01/20/2019 223 (A) 40 - 160 Final         Failed - Cr in normal range and within 180 days    Creat  Date Value Ref Range Status  04/23/2017 1.20 0.60 - 1.35 mg/dL Final   Creatinine, Ser  Date Value Ref Range Status  02/24/2019 1.77 (H) 0.40 - 1.50 mg/dL Final         Failed - eGFR in normal range and within 180 days    GFR, Est African American  Date Value Ref Range Status  04/23/2017 85 > OR = 60 mL/min/1.15m Final   GFR calc Af Amer  Date Value Ref Range Status  01/25/2018 >60 >60 mL/min Final    Comment:    (NOTE) The eGFR has been calculated using the CKD EPI equation. This calculation has not been validated in all clinical situations. eGFR's persistently <60 mL/min signify possible Chronic Kidney Disease.    GFR, Est Non African American  Date Value Ref Range Status  04/23/2017 73 > OR = 60 mL/min/1.789mFinal   GFR calc non Af Amer  Date Value Ref Range Status  01/25/2018 >60 >60 mL/min Final   GFR  Date Value Ref Range Status  02/24/2019 41.63 (L) >60.00 mL/min Final         Passed - Total Cholesterol in normal range and within 360 days    Cholesterol  Date Value Ref Range Status  01/20/2019 99 0 - 200 Final         Passed -  LDL in normal range and within 360 days    LDL Cholesterol  Date Value Ref Range Status  01/20/2019 33  Final         Passed - ALT in normal range and within 180 days    ALT  Date Value Ref Range Status  01/20/2019 15 10 - 40 Final         Passed - AST in normal range and within 180 days    AST  Date Value Ref Range Status  01/20/2019 18 14 - 40 Final         Passed - Valid encounter within last 12 months    Recent Outpatient Visits          4 months ago Type 2 diabetes mellitus with stage 3 chronic kidney disease, with long-term current use of insulin ([facility]   9 months ago Type 2 diabetes mellitus with stage 3 chronic kidney disease, with long-term [facility] use of [facility]  [facility]  11 months ago Acute midline low back pain, unspecified whether sciatica present   [facility] Bread, Algis Greenhouse, MD   1 year ago Type 2 diabetes mellitus with stage 3 chronic kidney disease, with long-term current use of insulin (HCC)   [facility], Algis Greenhouse, MD   1 year ago S/P BKA (below knee amputation) unilateral, right [facility])   [facility] Roni Bread, Algis Greenhouse, MD      Future Appointments            Tomorrow [name], MD [place], [facility]

## 2019-06-27 NOTE — Telephone Encounter (Signed)
See request °

## 2019-06-28 ENCOUNTER — Ambulatory Visit: Payer: Medicare HMO | Admitting: Family Medicine

## 2019-06-29 ENCOUNTER — Other Ambulatory Visit: Payer: Self-pay

## 2019-06-29 MED ORDER — FENOFIBRATE 48 MG PO TABS: 48.0000 mg | ORAL_TABLET | Freq: Every day | ORAL | 0 refills | Status: DC

## 2019-06-30 ENCOUNTER — Encounter: Payer: Self-pay | Admitting: Family Medicine

## 2019-07-13 ENCOUNTER — Telehealth: Payer: Self-pay | Admitting: Family Medicine

## 2019-07-13 NOTE — Telephone Encounter (Signed)
Copied from [place] [phone]. Topic: Quick Communication - Rx Refill/Question >> Jul 13, 2019  8:54 AM Rainey Pines A wrote: Medication: sertraline (ZOLOFT) 100 MG tablet (Pharmacy requesting callback with clarification on medication dosage and instructions.)  Has the patient contacted their pharmacy? Yes (Agent: If no, request that the patient contact the pharmacy for the refill.) (Agent: If yes, when and what did the pharmacy advise?)Contact PCP  Preferred Pharmacy (with phone number or street name):[facility], [place] [phone] (Phone) [phone] (Fax)    Agent: Please be advised that RX refills may take up to 3 business days. We ask that you follow-up with your pharmacy.

## 2019-07-13 NOTE — Telephone Encounter (Signed)
See note

## 2019-07-13 NOTE — Telephone Encounter (Signed)
Gave clarification patient taking Zoloft 100 mg 1 tablet daily

## 2019-07-17 ENCOUNTER — Other Ambulatory Visit: Payer: Self-pay | Admitting: Family Medicine

## 2019-07-19 ENCOUNTER — Ambulatory Visit: Payer: Medicare HMO | Admitting: Family Medicine

## 2019-07-19 ENCOUNTER — Encounter: Payer: Self-pay | Admitting: Internal Medicine

## 2019-07-19 DIAGNOSIS — R460 Very low level of personal hygiene: Secondary | ICD-10-CM | POA: Diagnosis not present

## 2019-07-19 DIAGNOSIS — K4020 Bilateral inguinal hernia, without obstruction or gangrene, not specified as recurrent: Secondary | ICD-10-CM | POA: Diagnosis not present

## 2019-07-19 DIAGNOSIS — N492 Inflammatory disorders of scrotum: Secondary | ICD-10-CM | POA: Diagnosis not present

## 2019-07-19 DIAGNOSIS — K429 Umbilical hernia without obstruction or gangrene: Secondary | ICD-10-CM | POA: Diagnosis not present

## 2019-07-19 DIAGNOSIS — K5900 Constipation, unspecified: Secondary | ICD-10-CM | POA: Diagnosis not present

## 2019-07-19 DIAGNOSIS — L02216 Cutaneous abscess of umbilicus: Secondary | ICD-10-CM | POA: Diagnosis not present

## 2019-07-19 DIAGNOSIS — N289 Disorder of kidney and ureter, unspecified: Secondary | ICD-10-CM | POA: Diagnosis not present

## 2019-07-19 DIAGNOSIS — L03818 Cellulitis of other sites: Secondary | ICD-10-CM | POA: Diagnosis not present

## 2019-07-27 ENCOUNTER — Other Ambulatory Visit: Payer: Self-pay

## 2019-07-27 MED ORDER — LANTUS SOLOSTAR 100 UNIT/ML ~~LOC~~ SOPN: 55.0000 [IU] | PEN_INJECTOR | Freq: Every day | SUBCUTANEOUS | 3 refills | Status: AC

## 2019-08-19 ENCOUNTER — Other Ambulatory Visit: Payer: Self-pay | Admitting: Family Medicine

## 2019-08-29 ENCOUNTER — Other Ambulatory Visit: Payer: Self-pay

## 2019-08-29 MED ORDER — ATORVASTATIN CALCIUM 40 MG PO TABS: 40.0000 mg | ORAL_TABLET | Freq: Every day | ORAL | 3 refills | Status: AC

## 2019-09-05 ENCOUNTER — Other Ambulatory Visit: Payer: Self-pay

## 2019-09-05 MED ORDER — HYDROCHLOROTHIAZIDE 25 MG PO TABS: 25.0000 mg | ORAL_TABLET | Freq: Every day | ORAL | 2 refills | Status: DC

## 2019-09-29 ENCOUNTER — Telehealth: Payer: Self-pay | Admitting: Family Medicine

## 2019-09-29 NOTE — Telephone Encounter (Signed)
I called the patient to schedule AWV w/ Francisco Hutchinson.  Someone answered and said she's not home right now and to call back later.

## 2019-10-02 ENCOUNTER — Other Ambulatory Visit: Payer: Self-pay | Admitting: Family Medicine

## 2019-10-09 ENCOUNTER — Other Ambulatory Visit: Payer: Self-pay | Admitting: Family Medicine

## 2019-10-20 ENCOUNTER — Telehealth: Payer: Self-pay | Admitting: Family Medicine

## 2019-10-20 NOTE — Telephone Encounter (Signed)
Patient's mom is requesting an in office appointment for patient next week for a diabetic check up and possible ear infection/ ache. Does not want to schedule a virtual appointment. Please advise.

## 2019-10-21 ENCOUNTER — Other Ambulatory Visit: Payer: Self-pay | Admitting: *Deleted

## 2019-10-21 MED ORDER — JARDIANCE 10 MG PO TABS: 10.0000 mg | ORAL_TABLET | Freq: Every day | ORAL | 0 refills | Status: DC

## 2019-10-21 NOTE — Telephone Encounter (Signed)
Tammy, there is an open slot on Tuesday at 2:40PM you can schedule there for a follow up. Thanks

## 2019-10-24 NOTE — Telephone Encounter (Signed)
This time is no longer available.  Khamika to follow up with patient in regard.

## 2019-10-25 NOTE — Telephone Encounter (Signed)
Notified patient to call to schedule a appt

## 2019-10-27 ENCOUNTER — Ambulatory Visit: Payer: Medicare HMO | Admitting: Family Medicine

## 2019-11-08 ENCOUNTER — Ambulatory Visit (INDEPENDENT_AMBULATORY_CARE_PROVIDER_SITE_OTHER): Payer: Medicare Other | Admitting: Family Medicine

## 2019-11-08 ENCOUNTER — Other Ambulatory Visit: Payer: Self-pay

## 2019-11-08 ENCOUNTER — Encounter: Payer: Self-pay | Admitting: Family Medicine

## 2019-11-08 ENCOUNTER — Encounter: Payer: Self-pay | Admitting: Internal Medicine

## 2019-11-08 VITALS — BP 120/80 | HR 82 | Temp 98.2°F

## 2019-11-08 DIAGNOSIS — N183 Chronic kidney disease, stage 3 unspecified: Secondary | ICD-10-CM

## 2019-11-08 DIAGNOSIS — Z794 Long term (current) use of insulin: Secondary | ICD-10-CM

## 2019-11-08 DIAGNOSIS — E1159 Type 2 diabetes mellitus with other circulatory complications: Secondary | ICD-10-CM

## 2019-11-08 DIAGNOSIS — I10 Essential (primary) hypertension: Secondary | ICD-10-CM

## 2019-11-08 DIAGNOSIS — E1122 Type 2 diabetes mellitus with diabetic chronic kidney disease: Secondary | ICD-10-CM

## 2019-11-08 DIAGNOSIS — G8929 Other chronic pain: Secondary | ICD-10-CM

## 2019-11-08 DIAGNOSIS — K089 Disorder of teeth and supporting structures, unspecified: Secondary | ICD-10-CM

## 2019-11-08 MED ORDER — ACETIC ACID 2 % OT SOLN: 6.0000 [drp] | OTIC | 0 refills | Status: DC

## 2019-11-08 MED ORDER — AMOXICILLIN 875 MG PO TABS: 875.0000 mg | ORAL_TABLET | Freq: Two times a day (BID) | ORAL | 0 refills | Status: DC

## 2019-11-08 NOTE — Assessment & Plan Note (Signed)
Stable.  Continue Norvasc 5 mg daily, HCTZ 25 mg daily, and metoprolol tartrate 100 mg twice daily.

## 2019-11-08 NOTE — Progress Notes (Signed)
   Francisco Hutchinson is a 49 y.o. male who presents today for an office visit.  Assessment/Plan:  New/Acute Problems: Otalgia Exam consistent with otitis externa.  Will start acetic acid drops and amoxicillin given HIV status and poorly controlled type 2 diabetes.  Discussed reasons return to care.  Follow-up as needed.  Dental pain Hopefully will have some improvement with above amoxicillin.  Also place referral to surgeon.  Chronic Problems Addressed Today: Hypertension associated with diabetes ([place]) Stable.  Continue Norvasc 5 mg daily, HCTZ 25 mg daily, and metoprolol tartrate 100 mg twice daily.  Type 2 diabetes mellitus with diabetic chronic kidney disease ([place]) Patient declined having A1c checked today.  Reported Home sugars in the 70s to 100s.  We will continue current regimen of Lantus 52 units daily, Trulicity 1.5 mg weekly, and Jardiance 10 mg daily.  They will follow up soon to recheck A1c.     Subjective:  HPI:  Patient with right ear pain for the past week.  Feels like prior ear infections.  Has tried using.  Drops with no improvement.  Tylenol helps modestly.  No reported fever chills.  Also has bilateral dental pain.  He has seen a dentist in the past who recommended to have teeth removed.  He is otherwise doing well.  Does not want to have blood work done today.       Objective:  Physical Exam: BP 120/80 (BP Location: Left Arm, Patient Position: Sitting, Cuff Size: Large)   Pulse 82   Temp 98.2 F (36.8 C) (Temporal)   SpO2 100%   Gen: No acute distress, resting comfortably Left TM clear.  Right EAC with purulent debris. CV: Regular rate and rhythm with no murmurs appreciated Pulm: Normal work of breathing, clear to auscultation bilaterally with no crackles, wheezes, or rhonchi Neuro: Grossly normal, moves all extremities Psych: Normal affect and thought content      Denisa Enterline M. [name], MD 11/08/2019 2:42 PM

## 2019-11-08 NOTE — Progress Notes (Signed)
Patient ID: Francisco Hutchinson, male   DOB: 09-14-1970, 49 y.o.   MRN: [id number] [facility] patient of Dr Megan Salon called left message to call about scheduling lab and ov

## 2019-11-08 NOTE — Patient Instructions (Signed)
It was very nice to see you today!  You have an ear infection.  Please start the drops.  Please also start the antibiotic.  I'll place a referral for you to see an oral surgeon.  Come back in about 3 months for your next checkup with blood work, or sooner if needed.  Take care, Dr [name]  Please try these tips to maintain a healthy lifestyle:   Eat at least 3 REAL meals and 1-2 snacks per day.  Aim for no more than 5 hours between eating.  If you eat breakfast, please do so within one hour of getting up.    Each meal should contain half fruits/vegetables, one quarter protein, and one quarter carbs (no bigger than a computer mouse)   Cut down on sweet beverages. This includes juice, soda, and sweet tea.     Drink at least 1 glass of water with each meal and aim for at least 8 glasses per day   Exercise at least 150 minutes every week.

## 2019-11-08 NOTE — Assessment & Plan Note (Signed)
Patient declined having A1c checked today.  Reported Home sugars in the 70s to 100s.  We will continue current regimen of Lantus 52 units daily, Trulicity 1.5 mg weekly, and Jardiance 10 mg daily.  They will follow up soon to recheck A1c.

## 2019-11-18 ENCOUNTER — Ambulatory Visit: Payer: Medicare Other | Admitting: Family Medicine

## 2019-11-20 ENCOUNTER — Other Ambulatory Visit: Payer: Self-pay | Admitting: Family Medicine

## 2019-11-30 ENCOUNTER — Other Ambulatory Visit: Payer: Self-pay

## 2019-11-30 ENCOUNTER — Encounter: Payer: Self-pay | Admitting: Internal Medicine

## 2019-11-30 ENCOUNTER — Telehealth: Payer: Self-pay

## 2019-11-30 ENCOUNTER — Ambulatory Visit (INDEPENDENT_AMBULATORY_CARE_PROVIDER_SITE_OTHER): Payer: Medicare Other | Admitting: Internal Medicine

## 2019-11-30 DIAGNOSIS — Z79899 Other long term (current) drug therapy: Secondary | ICD-10-CM | POA: Diagnosis not present

## 2019-11-30 DIAGNOSIS — I10 Essential (primary) hypertension: Secondary | ICD-10-CM | POA: Diagnosis not present

## 2019-11-30 DIAGNOSIS — B20 Human immunodeficiency virus [HIV] disease: Secondary | ICD-10-CM

## 2019-11-30 MED ORDER — TIVICAY 50 MG PO TABS: 50.0000 mg | ORAL_TABLET | Freq: Every day | ORAL | 3 refills | Status: AC

## 2019-11-30 MED ORDER — DESCOVY 200-25 MG PO TABS: 1.0000 | ORAL_TABLET | Freq: Every day | ORAL | 3 refills | Status: AC

## 2019-11-30 NOTE — Progress Notes (Signed)
Patient Active Problem List   Diagnosis Date Noted  . HIV disease ([place])     Priority: High  . Onychomycosis 06/15/2018  . Debility 04/16/2018  . Sacral ulcer ([name]) 04/16/2018  . Hematuria 03/11/2018  . Diabetic ulcer of left foot associated with type 2 diabetes mellitus ([place]) 03/11/2018  . S/P unilateral BKA (below knee amputation), right ([place]) 02/05/2018  . Depression, major, single episode, in partial remission ([place]) 07/30/2017  . Hyperlipidemia associated with type 2 diabetes mellitus ([place]) 06/24/2017  . Hypertension associated with diabetes ([place]) 06/24/2017  . Hidradenitis 05/29/2017  . [place] intertrigo 05/29/2017  . Stroke ([place])   . CKD (chronic kidney disease), stage III ([place]) 03/25/2017  . Type 2 diabetes mellitus with diabetic chronic kidney disease ([place]) 03/25/2017    Patient's Medications  New Prescriptions   No medications on file  Previous Medications   ACETAMINOPHEN (TYLENOL) 325 MG TABLET    Take 2 tablets (650 mg total) by mouth every 6 (six) hours as needed for mild pain, fever or headache (or Fever >/= 101).   ACETIC ACID 2 % OTIC SOLUTION    Place 6 drops into the right ear every 3 (three) hours.   AMLODIPINE (NORVASC) 5 MG TABLET    Take 1 tablet (5 mg total) by mouth daily.   AMOXICILLIN (AMOXIL) 875 MG TABLET    Take 1 tablet (875 mg total) by mouth 2 (two) times daily.   ASPIRIN EC 81 MG TABLET    Take 81 mg by mouth at bedtime.   ATORVASTATIN (LIPITOR) 40 MG TABLET    Take 1 tablet (40 mg total) by mouth daily.   COLLAGENASE (SANTYL) OINTMENT    Apply 1 application topically daily.   CYCLOBENZAPRINE (FLEXERIL) 10 MG TABLET    Take 1 tablet (10 mg total) by mouth 3 (three) times daily as needed for muscle spasms.   DULAGLUTIDE (TRULICITY) 1.5 EB/5.8XE SOPN    INJECT 1 PEN SUBCUTANEOUSLY ONCE A WEEK ON SUNDAY   EMPAGLIFLOZIN (JARDIANCE) 10 MG TABS TABLET    Take 10 mg by mouth daily.   FENOFIBRATE (TRICOR) 48 MG TABLET    Take 1 tablet by  mouth once daily   FERROUS GLUCONATE (FERGON) 324 MG TABLET    Take 1 tablet (324 mg total) by mouth daily with breakfast.   HYDROCHLOROTHIAZIDE (HYDRODIURIL) 25 MG TABLET    Take 1 tablet (25 mg total) by mouth daily.   INCONTINENCE SUPPLIES KIT    Use as needed for incontinence   LANTUS SOLOSTAR 100 UNIT/ML SOLOSTAR PEN    Inject 55 Units into the skin at bedtime.   METOPROLOL TARTRATE (LOPRESSOR) 100 MG TABLET    Take 1 tablet (100 mg total) by mouth 2 (two) times daily.   SERTRALINE (ZOLOFT) 100 MG TABLET    Take 1 tablet (100 mg total) by mouth 2 (two) times daily. Takes 182m total daily.  Modified Medications   Modified Medication Previous Medication   DESCOVY 200-25 MG TABLET DESCOVY 200-25 MG tablet      Take 1 tablet by mouth at bedtime.    Take 1 tablet by mouth at bedtime.    TIVICAY 50 MG TABLET TIVICAY 50 MG tablet      Take 1 tablet (50 mg total) by mouth daily.    Take 50 mg by mouth daily.   Discontinued Medications   No medications on file    Subjective: Francisco Reaperis in with his mother  for his routine HIV follow-up visit.  He is transferring his care here after our clinic in [place], [place] closed recently.  He has not had any problems obtaining, taking or tolerating his Descovy or Tivicay.  He says that he only misses doses once in a blue moon.  He is feeling well.  He is hoping to get the Covid vaccine soon.  Review of Systems: Review of Systems  Constitutional: Negative for fever and weight loss.  HENT: Negative for congestion and sore throat.   Respiratory: Negative for cough and shortness of breath.   Cardiovascular: Negative for chest pain.  Gastrointestinal: Negative for abdominal pain, diarrhea, nausea and vomiting.  Psychiatric/Behavioral: Negative for depression.    Past Medical History:  Diagnosis Date  . Allergy   . Anxiety and depression 05/29/2017  . Diabetes mellitus without complication ([place])   . HIV (human immunodeficiency virus infection)  ([place])   . Hypertension   . Stroke [facility])     Social History   Tobacco Use  . Smoking status: Never Smoker  . Smokeless tobacco: Never Used  Substance Use Topics  . Alcohol use: Yes    Alcohol/week: 2.0 standard drinks    Types: 1 Glasses of wine, 1 Cans of beer per week    Comment: rare  . Drug use: No    Family History  Problem Relation Age of Onset  . Arthritis Mother   . Hypertension Mother   . Alcohol abuse Father   . Cancer Father   . Diabetes Father   . Early death Father   . Hyperlipidemia Father   . Hypertension Father   . Alcohol abuse Sister   . Heart attack Maternal Grandmother   . Heart disease Maternal Grandmother   . Hypertension Maternal Grandmother   . Cancer Maternal Grandfather        lung  . Cancer Paternal Grandmother   . Diabetes Paternal Grandmother     No Known Allergies  Health Maintenance  Topic Date Due  . COVID-19 Vaccine (1) Never done  . OPHTHALMOLOGY EXAM  02/09/2018  . FOOT EXAM  11/04/2018  . HEMOGLOBIN A1C  07/22/2019  . INFLUENZA VACCINE  03/11/2020  . TETANUS/TDAP  08/11/2024  . PNEUMOCOCCAL POLYSACCHARIDE VACCINE AGE 7-64 HIGH RISK  Completed  . HIV Screening  Completed    Objective:  Vitals:   11/30/19 1501  BP: 95/67  Pulse: 73   There is no height or weight on file to calculate BMI.  Physical Exam Constitutional:      Comments: He is in good spirits.  Cardiovascular:     Rate and Rhythm: Normal rate and regular rhythm.     Heart sounds: No murmur.  Pulmonary:     Effort: Pulmonary effort is normal.     Breath sounds: Normal breath sounds.  Musculoskeletal:     Comments: He is status post right BKA and is wearing his prosthesis.  Psychiatric:        Mood and Affect: Mood normal.     Lab Results Lab Results  Component Value Date   WBC 10.0 01/20/2019   HGB 12.7 (A) 01/20/2019   HCT 38 (A) 01/20/2019   MCV 90.1 04/16/2018   PLT 283 01/20/2019    Lab Results  Component Value Date   CREATININE  1.77 (H) 02/24/2019   BUN 39 (H) 02/24/2019   NA 137 02/24/2019   K 3.9 02/24/2019   CL 102 02/24/2019   CO2 27 02/24/2019    Lab  Results  Component Value Date   ALT 15 01/20/2019   AST 18 01/20/2019   ALKPHOS 119 01/20/2019   BILITOT 0.3 04/16/2018    Lab Results  Component Value Date   CHOL 99 01/20/2019   HDL 21 (A) 01/20/2019   LDLCALC 33 01/20/2019   LDLDIRECT 145.0 05/28/2017   TRIG 223 (A) 01/20/2019   CHOLHDL 8 05/28/2017   No results found for: LABRPR, RPRTITER HIV 1 RNA Quant (copies/mL)  Date Value  05/28/2017 <20 NOT DETECTED  04/23/2017 <20 NOT DETECTED  03/25/2017 <20   CD4 T Cell Abs (/uL)  Date Value  01/19/2018 300 (L)  04/23/2017 480  03/23/2017 310 (L)  CD4 count June 2020 482 HIV viral load June 2020 less than 20   Problem List Items Addressed This Visit      High   HIV disease ([place]) (Chronic)    His infection has been under excellent, long-term control.  He will get lab work today, continue [place] and follow-up here in 1 year.      Relevant Medications   DESCOVY 200-25 MG tablet   TIVICAY 50 MG tablet   Other Relevant Orders   T-helper cell (CD4)- ([facility] clinic only)   HIV-1 RNA quant-no reflex-bld   CBC   Comprehensive metabolic panel   RPR   Lipid panel        Michel Bickers, MD [facility] for [place] [phone] pager   [phone] cell 11/30/2019, 3:25 PM

## 2019-11-30 NOTE — Telephone Encounter (Signed)
Requesting Refill

## 2019-11-30 NOTE — Assessment & Plan Note (Signed)
His infection has been under excellent, long-term control.  He will get lab work today, continue [place] and follow-up here in 1 year.

## 2019-11-30 NOTE — Telephone Encounter (Signed)
  LAST APPOINTMENT DATE: 11/20/2019   NEXT APPOINTMENT DATE:@7 /03/2020  MEDICATION:acetic acid 2 % otic solution  [id number] [place], [place] - [address] Phone:  [phone]  Fax:  [phone]     Patient states that they have called the pharmacy for this refill  **Let patient know to contact pharmacy at the end of the day to make sure medication is ready. **  ** Please notify patient to allow 48-72 hours to process**  **Encourage patient to contact the pharmacy for refills or they can request refills through [facility]**  CLINICAL FILLS OUT ALL BELOW:   LAST REFILL:  QTY:  REFILL DATE:    OTHER COMMENTS:    Okay for refill?  Please advise

## 2019-12-01 ENCOUNTER — Other Ambulatory Visit: Payer: Self-pay | Admitting: *Deleted

## 2019-12-01 LAB — T-HELPER CELL (CD4) - (RCID CLINIC ONLY)
CD4 % Helper T Cell: 44 % (ref 33–65)
CD4 T Cell Abs: 487 /uL (ref 400–1790)

## 2019-12-01 MED ORDER — ACETIC ACID 2 % OT SOLN: 6.0000 [drp] | OTIC | 0 refills | Status: DC

## 2019-12-01 NOTE — Telephone Encounter (Signed)
Rx send to pharmacy  

## 2019-12-01 NOTE — Telephone Encounter (Signed)
Ok with me. Please place any necessary orders. 

## 2019-12-02 LAB — HIV-1 RNA QUANT-NO REFLEX-BLD
HIV 1 RNA Quant: 59200 copies/mL — ABNORMAL HIGH
HIV-1 RNA Quant, Log: 4.77 Log copies/mL — ABNORMAL HIGH

## 2019-12-02 LAB — CBC
HCT: 32.1 % — ABNORMAL LOW (ref 38.5–50.0)
Hemoglobin: 10.6 g/dL — ABNORMAL LOW (ref 13.2–17.1)
MCH: 31.1 pg (ref 27.0–33.0)
MCHC: 33 g/dL (ref 32.0–36.0)
MCV: 94.1 fL (ref 80.0–100.0)
MPV: 10.5 fL (ref 7.5–12.5)
Platelets: 393 10*3/uL (ref 140–400)
RBC: 3.41 10*6/uL — ABNORMAL LOW (ref 4.20–5.80)
RDW: 13.1 % (ref 11.0–15.0)
WBC: 11.1 10*3/uL — ABNORMAL HIGH (ref 3.8–10.8)

## 2019-12-02 LAB — LIPID PANEL
Cholesterol: 111 mg/dL (ref <200)
HDL: 29 mg/dL — ABNORMAL LOW (ref >=40)
LDL Cholesterol (Calc): 55 mg/dL (calc)
Non-HDL Cholesterol (Calc): 82 mg/dL (calc) (ref <130)
Total CHOL/HDL Ratio: 3.8 (calc) (ref <5.0)
Triglycerides: 208 mg/dL — ABNORMAL HIGH (ref <150)

## 2019-12-02 LAB — COMPREHENSIVE METABOLIC PANEL
AG Ratio: 0.7 (calc) — ABNORMAL LOW (ref 1.0–2.5)
ALT: 8 U/L — ABNORMAL LOW (ref 9–46)
AST: 11 U/L (ref 10–40)
Albumin: 2.8 g/dL — ABNORMAL LOW (ref 3.6–5.1)
Alkaline phosphatase (APISO): 83 U/L (ref 36–130)
BUN/Creatinine Ratio: 18 (calc) (ref 6–22)
BUN: 27 mg/dL — ABNORMAL HIGH (ref 7–25)
CO2: 27 mmol/L (ref 20–32)
Calcium: 8.6 mg/dL (ref 8.6–10.3)
Chloride: 101 mmol/L (ref 98–110)
Creat: 1.5 mg/dL — ABNORMAL HIGH (ref 0.60–1.35)
Globulin: 3.9 g/dL (calc) — ABNORMAL HIGH (ref 1.9–3.7)
Glucose, Bld: 229 mg/dL — ABNORMAL HIGH (ref 65–99)
Potassium: 4 mmol/L (ref 3.5–5.3)
Sodium: 137 mmol/L (ref 135–146)
Total Bilirubin: 0.3 mg/dL (ref 0.2–1.2)
Total Protein: 6.7 g/dL (ref 6.1–8.1)

## 2019-12-02 LAB — RPR: RPR Ser Ql: NONREACTIVE

## 2019-12-05 ENCOUNTER — Telehealth: Payer: Self-pay

## 2019-12-05 ENCOUNTER — Other Ambulatory Visit: Payer: Self-pay | Admitting: *Deleted

## 2019-12-05 MED ORDER — METOPROLOL TARTRATE 100 MG PO TABS: 100.0000 mg | ORAL_TABLET | Freq: Two times a day (BID) | ORAL | 1 refills | Status: AC

## 2019-12-05 NOTE — Telephone Encounter (Signed)
Attempted to relay message from MD regarding lab results and to schedule follow up appt. Unable to reach patient at this time. His mom will have patient call us back.  Did not disclose any information regarding labs or diagnosis to caller. [place]

## 2019-12-05 NOTE — Telephone Encounter (Signed)
-----   Message from Michel Bickers, MD sent at 12/05/2019  4:33 PM EDT ----- Please schedule Scott to see me in my first available open slot. It appears he may not have been as adherent with his therapy as he told me recently.

## 2019-12-08 ENCOUNTER — Telehealth: Payer: Self-pay | Admitting: Family Medicine

## 2019-12-08 NOTE — Telephone Encounter (Signed)
Please advise 

## 2019-12-08 NOTE — Telephone Encounter (Signed)
Mom calling says son has been off his medication for ear infection about 2 weeks and its still hurting him, she requests another type antibiotic ( amoxicillin) . Please Advise. jk

## 2019-12-09 ENCOUNTER — Other Ambulatory Visit: Payer: Self-pay

## 2019-12-09 MED ORDER — DOXYCYCLINE HYCLATE 100 MG PO TABS: 100.0000 mg | ORAL_TABLET | Freq: Two times a day (BID) | ORAL | 0 refills | Status: AC

## 2019-12-09 NOTE — Telephone Encounter (Signed)
Noted  

## 2019-12-09 NOTE — Telephone Encounter (Signed)
Ok to send in doxycycline 100mg  bid x 7 days. Would like for him to let us know if not improving.  Algis Greenhouse. [name], MD 12/09/2019 9:31 AM

## 2019-12-09 NOTE — Telephone Encounter (Signed)
Notified patient Rx sent to [facility]

## 2019-12-09 NOTE — Telephone Encounter (Signed)
Patient returning call. Patient states moving forward please send prescriptions to  [id number] - [place],  - [address] Phone:  [phone]  Fax:  [phone]

## 2019-12-14 ENCOUNTER — Other Ambulatory Visit: Payer: Self-pay | Admitting: *Deleted

## 2019-12-14 ENCOUNTER — Other Ambulatory Visit: Payer: Self-pay | Admitting: Family Medicine

## 2019-12-14 MED ORDER — TRULICITY 1.5 MG/0.5ML ~~LOC~~ SOAJ: SUBCUTANEOUS | 3 refills | Status: AC

## 2019-12-23 NOTE — Telephone Encounter (Signed)
Attempted to call patient to follow up on mychart message from 4/29 to schedule appointment. Unable to reach patient at this time; voicemail is full and unable to accept any new messages. [place]

## 2020-01-08 ENCOUNTER — Other Ambulatory Visit: Payer: Self-pay | Admitting: Family Medicine

## 2020-01-17 ENCOUNTER — Other Ambulatory Visit: Payer: Self-pay | Admitting: Family Medicine

## 2020-01-17 DIAGNOSIS — Z794 Long term (current) use of insulin: Secondary | ICD-10-CM | POA: Diagnosis not present

## 2020-01-17 DIAGNOSIS — M272 Inflammatory conditions of jaws: Secondary | ICD-10-CM | POA: Diagnosis not present

## 2020-01-17 DIAGNOSIS — G9381 Temporal sclerosis: Secondary | ICD-10-CM | POA: Diagnosis not present

## 2020-01-17 DIAGNOSIS — H9209 Otalgia, unspecified ear: Secondary | ICD-10-CM | POA: Diagnosis not present

## 2020-01-17 DIAGNOSIS — R930 Abnormal findings on diagnostic imaging of skull and head, not elsewhere classified: Secondary | ICD-10-CM | POA: Diagnosis not present

## 2020-01-17 DIAGNOSIS — Z79899 Other long term (current) drug therapy: Secondary | ICD-10-CM | POA: Diagnosis not present

## 2020-01-17 DIAGNOSIS — Z743 Need for continuous supervision: Secondary | ICD-10-CM | POA: Diagnosis not present

## 2020-01-17 DIAGNOSIS — E119 Type 2 diabetes mellitus without complications: Secondary | ICD-10-CM | POA: Diagnosis not present

## 2020-01-17 DIAGNOSIS — I679 Cerebrovascular disease, unspecified: Secondary | ICD-10-CM | POA: Diagnosis not present

## 2020-01-17 DIAGNOSIS — R609 Edema, unspecified: Secondary | ICD-10-CM | POA: Diagnosis not present

## 2020-01-17 DIAGNOSIS — Z8673 Personal history of transient ischemic attack (TIA), and cerebral infarction without residual deficits: Secondary | ICD-10-CM | POA: Diagnosis not present

## 2020-01-17 DIAGNOSIS — R0902 Hypoxemia: Secondary | ICD-10-CM | POA: Diagnosis not present

## 2020-01-17 DIAGNOSIS — I6782 Cerebral ischemia: Secondary | ICD-10-CM | POA: Diagnosis not present

## 2020-01-18 DIAGNOSIS — I130 Hypertensive heart and chronic kidney disease with heart failure and stage 1 through stage 4 chronic kidney disease, or unspecified chronic kidney disease: Secondary | ICD-10-CM | POA: Diagnosis not present

## 2020-01-18 DIAGNOSIS — M726 Necrotizing fasciitis: Secondary | ICD-10-CM | POA: Diagnosis not present

## 2020-01-18 DIAGNOSIS — M6281 Muscle weakness (generalized): Secondary | ICD-10-CM | POA: Diagnosis not present

## 2020-01-18 DIAGNOSIS — I69354 Hemiplegia and hemiparesis following cerebral infarction affecting left non-dominant side: Secondary | ICD-10-CM | POA: Diagnosis not present

## 2020-01-18 DIAGNOSIS — I4891 Unspecified atrial fibrillation: Secondary | ICD-10-CM | POA: Diagnosis not present

## 2020-01-18 DIAGNOSIS — Z89511 Acquired absence of right leg below knee: Secondary | ICD-10-CM | POA: Diagnosis not present

## 2020-01-18 DIAGNOSIS — E1169 Type 2 diabetes mellitus with other specified complication: Secondary | ICD-10-CM | POA: Diagnosis not present

## 2020-01-18 DIAGNOSIS — M8618 Other acute osteomyelitis, other site: Secondary | ICD-10-CM | POA: Diagnosis not present

## 2020-01-18 DIAGNOSIS — R930 Abnormal findings on diagnostic imaging of skull and head, not elsewhere classified: Secondary | ICD-10-CM | POA: Diagnosis not present

## 2020-01-18 DIAGNOSIS — E119 Type 2 diabetes mellitus without complications: Secondary | ICD-10-CM | POA: Diagnosis not present

## 2020-01-18 DIAGNOSIS — E7849 Other hyperlipidemia: Secondary | ICD-10-CM | POA: Diagnosis not present

## 2020-01-18 DIAGNOSIS — H6021 Malignant otitis externa, right ear: Secondary | ICD-10-CM | POA: Diagnosis not present

## 2020-01-18 DIAGNOSIS — E1122 Type 2 diabetes mellitus with diabetic chronic kidney disease: Secondary | ICD-10-CM | POA: Diagnosis not present

## 2020-01-18 DIAGNOSIS — H6091 Unspecified otitis externa, right ear: Secondary | ICD-10-CM | POA: Diagnosis not present

## 2020-01-18 DIAGNOSIS — I509 Heart failure, unspecified: Secondary | ICD-10-CM | POA: Diagnosis not present

## 2020-01-18 DIAGNOSIS — B9689 Other specified bacterial agents as the cause of diseases classified elsewhere: Secondary | ICD-10-CM | POA: Diagnosis not present

## 2020-01-18 DIAGNOSIS — N182 Chronic kidney disease, stage 2 (mild): Secondary | ICD-10-CM | POA: Diagnosis not present

## 2020-01-18 DIAGNOSIS — E1165 Type 2 diabetes mellitus with hyperglycemia: Secondary | ICD-10-CM | POA: Diagnosis not present

## 2020-01-18 DIAGNOSIS — M26601 Right temporomandibular joint disorder, unspecified: Secondary | ICD-10-CM | POA: Diagnosis not present

## 2020-01-18 DIAGNOSIS — M869 Osteomyelitis, unspecified: Secondary | ICD-10-CM | POA: Diagnosis not present

## 2020-01-18 DIAGNOSIS — R279 Unspecified lack of coordination: Secondary | ICD-10-CM | POA: Diagnosis not present

## 2020-01-18 DIAGNOSIS — H9201 Otalgia, right ear: Secondary | ICD-10-CM | POA: Diagnosis not present

## 2020-01-18 DIAGNOSIS — R5381 Other malaise: Secondary | ICD-10-CM | POA: Diagnosis not present

## 2020-01-18 DIAGNOSIS — H6001 Abscess of right external ear: Secondary | ICD-10-CM | POA: Diagnosis not present

## 2020-01-18 DIAGNOSIS — N189 Chronic kidney disease, unspecified: Secondary | ICD-10-CM | POA: Diagnosis not present

## 2020-01-18 DIAGNOSIS — R52 Pain, unspecified: Secondary | ICD-10-CM | POA: Diagnosis not present

## 2020-01-18 DIAGNOSIS — Z794 Long term (current) use of insulin: Secondary | ICD-10-CM | POA: Diagnosis not present

## 2020-01-18 DIAGNOSIS — D6869 Other thrombophilia: Secondary | ICD-10-CM | POA: Diagnosis not present

## 2020-01-18 DIAGNOSIS — E876 Hypokalemia: Secondary | ICD-10-CM | POA: Diagnosis not present

## 2020-01-18 DIAGNOSIS — E1142 Type 2 diabetes mellitus with diabetic polyneuropathy: Secondary | ICD-10-CM | POA: Diagnosis not present

## 2020-01-18 DIAGNOSIS — I69954 Hemiplegia and hemiparesis following unspecified cerebrovascular disease affecting left non-dominant side: Secondary | ICD-10-CM | POA: Diagnosis not present

## 2020-01-18 DIAGNOSIS — E11628 Type 2 diabetes mellitus with other skin complications: Secondary | ICD-10-CM | POA: Diagnosis not present

## 2020-01-18 DIAGNOSIS — E785 Hyperlipidemia, unspecified: Secondary | ICD-10-CM | POA: Diagnosis not present

## 2020-01-18 DIAGNOSIS — R609 Edema, unspecified: Secondary | ICD-10-CM | POA: Diagnosis not present

## 2020-01-18 DIAGNOSIS — Z20822 Contact with and (suspected) exposure to covid-19: Secondary | ICD-10-CM | POA: Diagnosis not present

## 2020-01-18 DIAGNOSIS — H6090 Unspecified otitis externa, unspecified ear: Secondary | ICD-10-CM | POA: Diagnosis not present

## 2020-01-18 DIAGNOSIS — L0201 Cutaneous abscess of face: Secondary | ICD-10-CM | POA: Diagnosis not present

## 2020-01-18 DIAGNOSIS — K029 Dental caries, unspecified: Secondary | ICD-10-CM | POA: Diagnosis not present

## 2020-01-18 DIAGNOSIS — L89892 Pressure ulcer of other site, stage 2: Secondary | ICD-10-CM | POA: Diagnosis not present

## 2020-01-18 DIAGNOSIS — E1151 Type 2 diabetes mellitus with diabetic peripheral angiopathy without gangrene: Secondary | ICD-10-CM | POA: Diagnosis not present

## 2020-01-18 DIAGNOSIS — E261 Secondary hyperaldosteronism: Secondary | ICD-10-CM | POA: Diagnosis not present

## 2020-01-18 DIAGNOSIS — E113513 Type 2 diabetes mellitus with proliferative diabetic retinopathy with macular edema, bilateral: Secondary | ICD-10-CM | POA: Diagnosis not present

## 2020-01-18 DIAGNOSIS — E1121 Type 2 diabetes mellitus with diabetic nephropathy: Secondary | ICD-10-CM | POA: Diagnosis not present

## 2020-01-18 DIAGNOSIS — H9209 Otalgia, unspecified ear: Secondary | ICD-10-CM | POA: Diagnosis not present

## 2020-01-18 DIAGNOSIS — D8481 Immunodeficiency due to conditions classified elsewhere: Secondary | ICD-10-CM | POA: Diagnosis not present

## 2020-01-18 DIAGNOSIS — K122 Cellulitis and abscess of mouth: Secondary | ICD-10-CM | POA: Diagnosis not present

## 2020-01-18 DIAGNOSIS — I129 Hypertensive chronic kidney disease with stage 1 through stage 4 chronic kidney disease, or unspecified chronic kidney disease: Secondary | ICD-10-CM | POA: Diagnosis not present

## 2020-01-18 DIAGNOSIS — M0080 Arthritis due to other bacteria, unspecified joint: Secondary | ICD-10-CM | POA: Diagnosis not present

## 2020-01-18 DIAGNOSIS — C410 Malignant neoplasm of bones of skull and face: Secondary | ICD-10-CM | POA: Diagnosis not present

## 2020-01-18 DIAGNOSIS — I6523 Occlusion and stenosis of bilateral carotid arteries: Secondary | ICD-10-CM | POA: Diagnosis not present

## 2020-01-18 DIAGNOSIS — Z743 Need for continuous supervision: Secondary | ICD-10-CM | POA: Diagnosis not present

## 2020-01-18 DIAGNOSIS — I10 Essential (primary) hypertension: Secondary | ICD-10-CM | POA: Diagnosis not present

## 2020-01-18 DIAGNOSIS — L89152 Pressure ulcer of sacral region, stage 2: Secondary | ICD-10-CM | POA: Diagnosis not present

## 2020-01-18 DIAGNOSIS — M009 Pyogenic arthritis, unspecified: Secondary | ICD-10-CM | POA: Diagnosis not present

## 2020-01-18 DIAGNOSIS — D649 Anemia, unspecified: Secondary | ICD-10-CM | POA: Diagnosis not present

## 2020-01-18 DIAGNOSIS — N183 Chronic kidney disease, stage 3 unspecified: Secondary | ICD-10-CM | POA: Diagnosis not present

## 2020-01-18 DIAGNOSIS — R220 Localized swelling, mass and lump, head: Secondary | ICD-10-CM | POA: Diagnosis not present

## 2020-01-18 DIAGNOSIS — R519 Headache, unspecified: Secondary | ICD-10-CM | POA: Diagnosis not present

## 2020-01-28 ENCOUNTER — Telehealth: Payer: Self-pay | Admitting: Infectious Diseases

## 2020-01-28 DIAGNOSIS — I69354 Hemiplegia and hemiparesis following cerebral infarction affecting left non-dominant side: Secondary | ICD-10-CM | POA: Diagnosis not present

## 2020-01-28 DIAGNOSIS — E261 Secondary hyperaldosteronism: Secondary | ICD-10-CM | POA: Diagnosis not present

## 2020-01-28 DIAGNOSIS — J90 Pleural effusion, not elsewhere classified: Secondary | ICD-10-CM | POA: Diagnosis not present

## 2020-01-28 DIAGNOSIS — E1151 Type 2 diabetes mellitus with diabetic peripheral angiopathy without gangrene: Secondary | ICD-10-CM | POA: Diagnosis not present

## 2020-01-28 DIAGNOSIS — R5381 Other malaise: Secondary | ICD-10-CM | POA: Diagnosis not present

## 2020-01-28 DIAGNOSIS — R262 Difficulty in walking, not elsewhere classified: Secondary | ICD-10-CM | POA: Diagnosis not present

## 2020-01-28 DIAGNOSIS — M726 Necrotizing fasciitis: Secondary | ICD-10-CM | POA: Diagnosis not present

## 2020-01-28 DIAGNOSIS — Z79899 Other long term (current) drug therapy: Secondary | ICD-10-CM | POA: Diagnosis not present

## 2020-01-28 DIAGNOSIS — R1110 Vomiting, unspecified: Secondary | ICD-10-CM | POA: Diagnosis not present

## 2020-01-28 DIAGNOSIS — M869 Osteomyelitis, unspecified: Secondary | ICD-10-CM | POA: Diagnosis not present

## 2020-01-28 DIAGNOSIS — D649 Anemia, unspecified: Secondary | ICD-10-CM | POA: Diagnosis not present

## 2020-01-28 DIAGNOSIS — N390 Urinary tract infection, site not specified: Secondary | ICD-10-CM | POA: Diagnosis not present

## 2020-01-28 DIAGNOSIS — E113513 Type 2 diabetes mellitus with proliferative diabetic retinopathy with macular edema, bilateral: Secondary | ICD-10-CM | POA: Diagnosis not present

## 2020-01-28 DIAGNOSIS — I130 Hypertensive heart and chronic kidney disease with heart failure and stage 1 through stage 4 chronic kidney disease, or unspecified chronic kidney disease: Secondary | ICD-10-CM | POA: Diagnosis not present

## 2020-01-28 DIAGNOSIS — N183 Chronic kidney disease, stage 3 unspecified: Secondary | ICD-10-CM | POA: Diagnosis not present

## 2020-01-28 DIAGNOSIS — I509 Heart failure, unspecified: Secondary | ICD-10-CM | POA: Diagnosis not present

## 2020-01-28 DIAGNOSIS — E7849 Other hyperlipidemia: Secondary | ICD-10-CM | POA: Diagnosis not present

## 2020-01-28 DIAGNOSIS — N182 Chronic kidney disease, stage 2 (mild): Secondary | ICD-10-CM | POA: Diagnosis not present

## 2020-01-28 DIAGNOSIS — E1122 Type 2 diabetes mellitus with diabetic chronic kidney disease: Secondary | ICD-10-CM | POA: Diagnosis not present

## 2020-01-28 DIAGNOSIS — I119 Hypertensive heart disease without heart failure: Secondary | ICD-10-CM | POA: Diagnosis not present

## 2020-01-28 DIAGNOSIS — Z743 Need for continuous supervision: Secondary | ICD-10-CM | POA: Diagnosis not present

## 2020-01-28 DIAGNOSIS — H6021 Malignant otitis externa, right ear: Secondary | ICD-10-CM | POA: Diagnosis not present

## 2020-01-28 DIAGNOSIS — E1165 Type 2 diabetes mellitus with hyperglycemia: Secondary | ICD-10-CM | POA: Diagnosis not present

## 2020-01-28 DIAGNOSIS — M26601 Right temporomandibular joint disorder, unspecified: Secondary | ICD-10-CM | POA: Diagnosis not present

## 2020-01-28 DIAGNOSIS — R112 Nausea with vomiting, unspecified: Secondary | ICD-10-CM | POA: Diagnosis not present

## 2020-01-28 DIAGNOSIS — R279 Unspecified lack of coordination: Secondary | ICD-10-CM | POA: Diagnosis not present

## 2020-01-28 DIAGNOSIS — D6869 Other thrombophilia: Secondary | ICD-10-CM | POA: Diagnosis not present

## 2020-01-28 DIAGNOSIS — L0201 Cutaneous abscess of face: Secondary | ICD-10-CM | POA: Diagnosis not present

## 2020-01-28 DIAGNOSIS — R000 Tachycardia, unspecified: Secondary | ICD-10-CM | POA: Diagnosis not present

## 2020-01-28 DIAGNOSIS — Z20822 Contact with and (suspected) exposure to covid-19: Secondary | ICD-10-CM | POA: Diagnosis not present

## 2020-01-28 DIAGNOSIS — B3749 Other urogenital candidiasis: Secondary | ICD-10-CM | POA: Diagnosis not present

## 2020-01-28 DIAGNOSIS — R110 Nausea: Secondary | ICD-10-CM | POA: Diagnosis not present

## 2020-01-28 DIAGNOSIS — I4891 Unspecified atrial fibrillation: Secondary | ICD-10-CM | POA: Diagnosis not present

## 2020-01-28 DIAGNOSIS — M6281 Muscle weakness (generalized): Secondary | ICD-10-CM | POA: Diagnosis not present

## 2020-01-28 DIAGNOSIS — D8481 Immunodeficiency due to conditions classified elsewhere: Secondary | ICD-10-CM | POA: Diagnosis not present

## 2020-01-28 DIAGNOSIS — B9689 Other specified bacterial agents as the cause of diseases classified elsewhere: Secondary | ICD-10-CM | POA: Diagnosis not present

## 2020-01-28 DIAGNOSIS — N179 Acute kidney failure, unspecified: Secondary | ICD-10-CM | POA: Diagnosis not present

## 2020-01-28 NOTE — Telephone Encounter (Signed)
Called by [facility] ID. Pt at [facility] with severe mastitis/otitis, underwent resection. Cx mixed flora, MRSA screen (-) He is being transferred to SNF in [facility] for continued care.  He is on cefepime/flagyl.   He needs 1) f/u appt 2) f/u of his OPAT  Thanks jeff

## 2020-01-30 ENCOUNTER — Encounter: Payer: Self-pay | Admitting: Infectious Diseases

## 2020-01-30 NOTE — Progress Notes (Signed)
Patient ID: Francisco Hutchinson, male   DOB: 1971-05-24, 49 y.o.   MRN: [id number] [facility] at [phone] left message for scheduler Hoyle Sauer B to call to schedule patient a 2 week hospital followup

## 2020-01-30 NOTE — Telephone Encounter (Signed)
Please schedule with any provider for Hospital Follow up Mastitis/Otitis in 2 weeks. [facility] discharge paperwork given to [facility]. [facility] also advising MRI at end of treatment 7/23. Patient was sent to skilled facility for IV therapy - [facility]. Landis Gandy, RN

## 2020-01-31 DIAGNOSIS — I4891 Unspecified atrial fibrillation: Secondary | ICD-10-CM | POA: Diagnosis not present

## 2020-01-31 DIAGNOSIS — R262 Difficulty in walking, not elsewhere classified: Secondary | ICD-10-CM | POA: Diagnosis not present

## 2020-01-31 DIAGNOSIS — E1151 Type 2 diabetes mellitus with diabetic peripheral angiopathy without gangrene: Secondary | ICD-10-CM | POA: Diagnosis not present

## 2020-01-31 DIAGNOSIS — H6021 Malignant otitis externa, right ear: Secondary | ICD-10-CM | POA: Diagnosis not present

## 2020-01-31 DIAGNOSIS — I119 Hypertensive heart disease without heart failure: Secondary | ICD-10-CM | POA: Diagnosis not present

## 2020-02-09 ENCOUNTER — Ambulatory Visit: Payer: Medicare Other | Admitting: Family Medicine

## 2020-02-12 DIAGNOSIS — Z20822 Contact with and (suspected) exposure to covid-19: Secondary | ICD-10-CM | POA: Diagnosis not present

## 2020-02-12 DIAGNOSIS — J90 Pleural effusion, not elsewhere classified: Secondary | ICD-10-CM | POA: Diagnosis not present

## 2020-02-12 DIAGNOSIS — R110 Nausea: Secondary | ICD-10-CM | POA: Diagnosis not present

## 2020-02-12 DIAGNOSIS — B3749 Other urogenital candidiasis: Secondary | ICD-10-CM | POA: Diagnosis not present

## 2020-02-12 DIAGNOSIS — N179 Acute kidney failure, unspecified: Secondary | ICD-10-CM | POA: Diagnosis not present

## 2020-02-12 DIAGNOSIS — R112 Nausea with vomiting, unspecified: Secondary | ICD-10-CM | POA: Diagnosis not present

## 2020-02-12 DIAGNOSIS — R1110 Vomiting, unspecified: Secondary | ICD-10-CM | POA: Diagnosis not present

## 2020-02-12 DIAGNOSIS — N390 Urinary tract infection, site not specified: Secondary | ICD-10-CM | POA: Diagnosis not present

## 2020-02-12 DIAGNOSIS — B9689 Other specified bacterial agents as the cause of diseases classified elsewhere: Secondary | ICD-10-CM | POA: Diagnosis not present

## 2020-02-12 DIAGNOSIS — M869 Osteomyelitis, unspecified: Secondary | ICD-10-CM | POA: Diagnosis not present

## 2020-02-13 DIAGNOSIS — M859 Disorder of bone density and structure, unspecified: Secondary | ICD-10-CM | POA: Diagnosis not present

## 2020-02-13 DIAGNOSIS — Z8673 Personal history of transient ischemic attack (TIA), and cerebral infarction without residual deficits: Secondary | ICD-10-CM | POA: Diagnosis not present

## 2020-02-13 DIAGNOSIS — L0201 Cutaneous abscess of face: Secondary | ICD-10-CM | POA: Diagnosis not present

## 2020-02-13 DIAGNOSIS — B49 Unspecified mycosis: Secondary | ICD-10-CM | POA: Diagnosis not present

## 2020-02-13 DIAGNOSIS — I517 Cardiomegaly: Secondary | ICD-10-CM | POA: Diagnosis not present

## 2020-02-13 DIAGNOSIS — M26629 Arthralgia of temporomandibular joint, unspecified side: Secondary | ICD-10-CM | POA: Diagnosis not present

## 2020-02-13 DIAGNOSIS — B377 Candidal sepsis: Secondary | ICD-10-CM | POA: Diagnosis not present

## 2020-02-13 DIAGNOSIS — Z7901 Long term (current) use of anticoagulants: Secondary | ICD-10-CM | POA: Diagnosis not present

## 2020-02-13 DIAGNOSIS — R110 Nausea: Secondary | ICD-10-CM | POA: Diagnosis not present

## 2020-02-13 DIAGNOSIS — E119 Type 2 diabetes mellitus without complications: Secondary | ICD-10-CM | POA: Diagnosis not present

## 2020-02-13 DIAGNOSIS — I130 Hypertensive heart and chronic kidney disease with heart failure and stage 1 through stage 4 chronic kidney disease, or unspecified chronic kidney disease: Secondary | ICD-10-CM | POA: Diagnosis not present

## 2020-02-13 DIAGNOSIS — M869 Osteomyelitis, unspecified: Secondary | ICD-10-CM | POA: Diagnosis not present

## 2020-02-13 DIAGNOSIS — B3789 Other sites of candidiasis: Secondary | ICD-10-CM | POA: Diagnosis not present

## 2020-02-13 DIAGNOSIS — R1110 Vomiting, unspecified: Secondary | ICD-10-CM | POA: Diagnosis not present

## 2020-02-13 DIAGNOSIS — M868X8 Other osteomyelitis, other site: Secondary | ICD-10-CM | POA: Diagnosis not present

## 2020-02-13 DIAGNOSIS — N183 Chronic kidney disease, stage 3 unspecified: Secondary | ICD-10-CM | POA: Diagnosis not present

## 2020-02-13 DIAGNOSIS — R197 Diarrhea, unspecified: Secondary | ICD-10-CM | POA: Diagnosis not present

## 2020-02-13 DIAGNOSIS — D8481 Immunodeficiency due to conditions classified elsewhere: Secondary | ICD-10-CM | POA: Diagnosis not present

## 2020-02-13 DIAGNOSIS — Z9181 History of falling: Secondary | ICD-10-CM | POA: Diagnosis not present

## 2020-02-13 DIAGNOSIS — R54 Age-related physical debility: Secondary | ICD-10-CM | POA: Diagnosis not present

## 2020-02-13 DIAGNOSIS — E861 Hypovolemia: Secondary | ICD-10-CM | POA: Diagnosis not present

## 2020-02-13 DIAGNOSIS — I129 Hypertensive chronic kidney disease with stage 1 through stage 4 chronic kidney disease, or unspecified chronic kidney disease: Secondary | ICD-10-CM | POA: Diagnosis not present

## 2020-02-13 DIAGNOSIS — Z7984 Long term (current) use of oral hypoglycemic drugs: Secondary | ICD-10-CM | POA: Diagnosis not present

## 2020-02-13 DIAGNOSIS — E113513 Type 2 diabetes mellitus with proliferative diabetic retinopathy with macular edema, bilateral: Secondary | ICD-10-CM | POA: Diagnosis not present

## 2020-02-13 DIAGNOSIS — Z7982 Long term (current) use of aspirin: Secondary | ICD-10-CM | POA: Diagnosis not present

## 2020-02-13 DIAGNOSIS — Z452 Encounter for adjustment and management of vascular access device: Secondary | ICD-10-CM | POA: Diagnosis not present

## 2020-02-13 DIAGNOSIS — N179 Acute kidney failure, unspecified: Secondary | ICD-10-CM | POA: Diagnosis not present

## 2020-02-13 DIAGNOSIS — R509 Fever, unspecified: Secondary | ICD-10-CM | POA: Diagnosis not present

## 2020-02-13 DIAGNOSIS — Z8619 Personal history of other infectious and parasitic diseases: Secondary | ICD-10-CM | POA: Diagnosis not present

## 2020-02-13 DIAGNOSIS — E261 Secondary hyperaldosteronism: Secondary | ICD-10-CM | POA: Diagnosis not present

## 2020-02-13 DIAGNOSIS — R809 Proteinuria, unspecified: Secondary | ICD-10-CM | POA: Diagnosis not present

## 2020-02-13 DIAGNOSIS — M272 Inflammatory conditions of jaws: Secondary | ICD-10-CM | POA: Diagnosis not present

## 2020-02-13 DIAGNOSIS — H6091 Unspecified otitis externa, right ear: Secondary | ICD-10-CM | POA: Diagnosis not present

## 2020-02-13 DIAGNOSIS — R0902 Hypoxemia: Secondary | ICD-10-CM | POA: Diagnosis not present

## 2020-02-13 DIAGNOSIS — I959 Hypotension, unspecified: Secondary | ICD-10-CM | POA: Diagnosis not present

## 2020-02-13 DIAGNOSIS — D72829 Elevated white blood cell count, unspecified: Secondary | ICD-10-CM | POA: Diagnosis not present

## 2020-02-13 DIAGNOSIS — B3749 Other urogenital candidiasis: Secondary | ICD-10-CM | POA: Diagnosis not present

## 2020-02-13 DIAGNOSIS — R2689 Other abnormalities of gait and mobility: Secondary | ICD-10-CM | POA: Diagnosis not present

## 2020-02-13 DIAGNOSIS — H6021 Malignant otitis externa, right ear: Secondary | ICD-10-CM | POA: Diagnosis not present

## 2020-02-13 DIAGNOSIS — E1122 Type 2 diabetes mellitus with diabetic chronic kidney disease: Secondary | ICD-10-CM | POA: Diagnosis not present

## 2020-02-13 DIAGNOSIS — E1121 Type 2 diabetes mellitus with diabetic nephropathy: Secondary | ICD-10-CM | POA: Diagnosis not present

## 2020-02-13 DIAGNOSIS — M26601 Right temporomandibular joint disorder, unspecified: Secondary | ICD-10-CM | POA: Diagnosis not present

## 2020-02-13 DIAGNOSIS — Z89511 Acquired absence of right leg below knee: Secondary | ICD-10-CM | POA: Diagnosis not present

## 2020-02-13 DIAGNOSIS — E876 Hypokalemia: Secondary | ICD-10-CM | POA: Diagnosis not present

## 2020-02-13 DIAGNOSIS — I4891 Unspecified atrial fibrillation: Secondary | ICD-10-CM | POA: Diagnosis not present

## 2020-02-13 DIAGNOSIS — D631 Anemia in chronic kidney disease: Secondary | ICD-10-CM | POA: Diagnosis not present

## 2020-02-13 DIAGNOSIS — I69354 Hemiplegia and hemiparesis following cerebral infarction affecting left non-dominant side: Secondary | ICD-10-CM | POA: Diagnosis not present

## 2020-02-13 DIAGNOSIS — G9340 Encephalopathy, unspecified: Secondary | ICD-10-CM | POA: Diagnosis not present

## 2020-02-13 DIAGNOSIS — D6869 Other thrombophilia: Secondary | ICD-10-CM | POA: Diagnosis not present

## 2020-02-13 DIAGNOSIS — M726 Necrotizing fasciitis: Secondary | ICD-10-CM | POA: Diagnosis not present

## 2020-02-13 DIAGNOSIS — Z792 Long term (current) use of antibiotics: Secondary | ICD-10-CM | POA: Diagnosis not present

## 2020-02-13 DIAGNOSIS — R112 Nausea with vomiting, unspecified: Secondary | ICD-10-CM | POA: Diagnosis not present

## 2020-02-13 DIAGNOSIS — R278 Other lack of coordination: Secondary | ICD-10-CM | POA: Diagnosis not present

## 2020-02-13 DIAGNOSIS — E1110 Type 2 diabetes mellitus with ketoacidosis without coma: Secondary | ICD-10-CM | POA: Diagnosis not present

## 2020-02-13 DIAGNOSIS — Z743 Need for continuous supervision: Secondary | ICD-10-CM | POA: Diagnosis not present

## 2020-02-13 DIAGNOSIS — H9391 Unspecified disorder of right ear: Secondary | ICD-10-CM | POA: Diagnosis not present

## 2020-02-13 DIAGNOSIS — E7849 Other hyperlipidemia: Secondary | ICD-10-CM | POA: Diagnosis not present

## 2020-02-13 DIAGNOSIS — E1151 Type 2 diabetes mellitus with diabetic peripheral angiopathy without gangrene: Secondary | ICD-10-CM | POA: Diagnosis not present

## 2020-02-13 DIAGNOSIS — Z87448 Personal history of other diseases of urinary system: Secondary | ICD-10-CM | POA: Diagnosis not present

## 2020-02-13 DIAGNOSIS — I509 Heart failure, unspecified: Secondary | ICD-10-CM | POA: Diagnosis not present

## 2020-02-13 DIAGNOSIS — E875 Hyperkalemia: Secondary | ICD-10-CM | POA: Diagnosis not present

## 2020-02-13 DIAGNOSIS — D649 Anemia, unspecified: Secondary | ICD-10-CM | POA: Diagnosis not present

## 2020-02-13 DIAGNOSIS — R7989 Other specified abnormal findings of blood chemistry: Secondary | ICD-10-CM | POA: Diagnosis not present

## 2020-02-13 DIAGNOSIS — N189 Chronic kidney disease, unspecified: Secondary | ICD-10-CM | POA: Diagnosis not present

## 2020-02-13 DIAGNOSIS — N170 Acute kidney failure with tubular necrosis: Secondary | ICD-10-CM | POA: Diagnosis not present

## 2020-02-13 DIAGNOSIS — Z20822 Contact with and (suspected) exposure to covid-19: Secondary | ICD-10-CM | POA: Diagnosis not present

## 2020-02-13 DIAGNOSIS — M7989 Other specified soft tissue disorders: Secondary | ICD-10-CM | POA: Diagnosis not present

## 2020-02-13 DIAGNOSIS — I4819 Other persistent atrial fibrillation: Secondary | ICD-10-CM | POA: Diagnosis not present

## 2020-02-14 DIAGNOSIS — N179 Acute kidney failure, unspecified: Secondary | ICD-10-CM | POA: Diagnosis not present

## 2020-02-14 DIAGNOSIS — Z8619 Personal history of other infectious and parasitic diseases: Secondary | ICD-10-CM | POA: Diagnosis not present

## 2020-02-14 DIAGNOSIS — I517 Cardiomegaly: Secondary | ICD-10-CM | POA: Diagnosis not present

## 2020-02-14 DIAGNOSIS — H6021 Malignant otitis externa, right ear: Secondary | ICD-10-CM | POA: Diagnosis not present

## 2020-02-14 DIAGNOSIS — I4891 Unspecified atrial fibrillation: Secondary | ICD-10-CM | POA: Diagnosis not present

## 2020-02-14 DIAGNOSIS — E119 Type 2 diabetes mellitus without complications: Secondary | ICD-10-CM | POA: Diagnosis not present

## 2020-02-14 DIAGNOSIS — H9391 Unspecified disorder of right ear: Secondary | ICD-10-CM | POA: Diagnosis not present

## 2020-02-15 DIAGNOSIS — E861 Hypovolemia: Secondary | ICD-10-CM | POA: Diagnosis not present

## 2020-02-15 DIAGNOSIS — N179 Acute kidney failure, unspecified: Secondary | ICD-10-CM | POA: Diagnosis not present

## 2020-02-15 DIAGNOSIS — R7989 Other specified abnormal findings of blood chemistry: Secondary | ICD-10-CM | POA: Diagnosis not present

## 2020-02-15 DIAGNOSIS — R1110 Vomiting, unspecified: Secondary | ICD-10-CM | POA: Diagnosis not present

## 2020-02-15 DIAGNOSIS — H6021 Malignant otitis externa, right ear: Secondary | ICD-10-CM | POA: Diagnosis not present

## 2020-02-15 DIAGNOSIS — R809 Proteinuria, unspecified: Secondary | ICD-10-CM | POA: Diagnosis not present

## 2020-02-15 DIAGNOSIS — R197 Diarrhea, unspecified: Secondary | ICD-10-CM | POA: Diagnosis not present

## 2020-02-15 DIAGNOSIS — N189 Chronic kidney disease, unspecified: Secondary | ICD-10-CM | POA: Diagnosis not present

## 2020-02-15 DIAGNOSIS — M272 Inflammatory conditions of jaws: Secondary | ICD-10-CM | POA: Diagnosis not present

## 2020-02-15 DIAGNOSIS — B49 Unspecified mycosis: Secondary | ICD-10-CM | POA: Diagnosis not present

## 2020-02-15 DIAGNOSIS — B3789 Other sites of candidiasis: Secondary | ICD-10-CM | POA: Diagnosis not present

## 2020-02-16 ENCOUNTER — Inpatient Hospital Stay: Payer: Medicare Other | Admitting: Internal Medicine

## 2020-02-16 ENCOUNTER — Ambulatory Visit: Payer: Medicare Other | Admitting: Family Medicine

## 2020-02-16 DIAGNOSIS — B3789 Other sites of candidiasis: Secondary | ICD-10-CM | POA: Diagnosis not present

## 2020-02-16 DIAGNOSIS — N189 Chronic kidney disease, unspecified: Secondary | ICD-10-CM | POA: Diagnosis not present

## 2020-02-16 DIAGNOSIS — N179 Acute kidney failure, unspecified: Secondary | ICD-10-CM | POA: Diagnosis not present

## 2020-02-16 DIAGNOSIS — R809 Proteinuria, unspecified: Secondary | ICD-10-CM | POA: Diagnosis not present

## 2020-02-16 DIAGNOSIS — D631 Anemia in chronic kidney disease: Secondary | ICD-10-CM | POA: Diagnosis not present

## 2020-02-16 DIAGNOSIS — E861 Hypovolemia: Secondary | ICD-10-CM | POA: Diagnosis not present

## 2020-02-16 DIAGNOSIS — H6021 Malignant otitis externa, right ear: Secondary | ICD-10-CM | POA: Diagnosis not present

## 2020-02-16 DIAGNOSIS — M272 Inflammatory conditions of jaws: Secondary | ICD-10-CM | POA: Diagnosis not present

## 2020-02-16 DIAGNOSIS — B49 Unspecified mycosis: Secondary | ICD-10-CM | POA: Diagnosis not present

## 2020-02-17 DIAGNOSIS — M272 Inflammatory conditions of jaws: Secondary | ICD-10-CM | POA: Diagnosis not present

## 2020-02-17 DIAGNOSIS — H6021 Malignant otitis externa, right ear: Secondary | ICD-10-CM | POA: Diagnosis not present

## 2020-02-17 DIAGNOSIS — B3789 Other sites of candidiasis: Secondary | ICD-10-CM | POA: Diagnosis not present

## 2020-02-17 DIAGNOSIS — B49 Unspecified mycosis: Secondary | ICD-10-CM | POA: Diagnosis not present

## 2020-02-17 DIAGNOSIS — H6091 Unspecified otitis externa, right ear: Secondary | ICD-10-CM | POA: Diagnosis not present

## 2020-02-18 DIAGNOSIS — N179 Acute kidney failure, unspecified: Secondary | ICD-10-CM | POA: Diagnosis not present

## 2020-02-18 DIAGNOSIS — M272 Inflammatory conditions of jaws: Secondary | ICD-10-CM | POA: Diagnosis not present

## 2020-02-18 DIAGNOSIS — N189 Chronic kidney disease, unspecified: Secondary | ICD-10-CM | POA: Diagnosis not present

## 2020-02-18 DIAGNOSIS — H6021 Malignant otitis externa, right ear: Secondary | ICD-10-CM | POA: Diagnosis not present

## 2020-02-20 ENCOUNTER — Ambulatory Visit (INDEPENDENT_AMBULATORY_CARE_PROVIDER_SITE_OTHER): Payer: Medicare Other | Admitting: Otolaryngology

## 2020-02-20 DIAGNOSIS — B3789 Other sites of candidiasis: Secondary | ICD-10-CM | POA: Diagnosis not present

## 2020-02-20 DIAGNOSIS — M272 Inflammatory conditions of jaws: Secondary | ICD-10-CM | POA: Diagnosis not present

## 2020-02-20 DIAGNOSIS — N179 Acute kidney failure, unspecified: Secondary | ICD-10-CM | POA: Diagnosis not present

## 2020-02-20 DIAGNOSIS — H6021 Malignant otitis externa, right ear: Secondary | ICD-10-CM | POA: Diagnosis not present

## 2020-02-21 DIAGNOSIS — N170 Acute kidney failure with tubular necrosis: Secondary | ICD-10-CM | POA: Diagnosis not present

## 2020-02-21 DIAGNOSIS — E1121 Type 2 diabetes mellitus with diabetic nephropathy: Secondary | ICD-10-CM | POA: Diagnosis not present

## 2020-02-22 DIAGNOSIS — N179 Acute kidney failure, unspecified: Secondary | ICD-10-CM | POA: Diagnosis not present

## 2020-02-22 DIAGNOSIS — B3789 Other sites of candidiasis: Secondary | ICD-10-CM | POA: Diagnosis not present

## 2020-02-22 DIAGNOSIS — M272 Inflammatory conditions of jaws: Secondary | ICD-10-CM | POA: Diagnosis not present

## 2020-02-22 DIAGNOSIS — H6021 Malignant otitis externa, right ear: Secondary | ICD-10-CM | POA: Diagnosis not present

## 2020-02-23 DIAGNOSIS — N189 Chronic kidney disease, unspecified: Secondary | ICD-10-CM | POA: Diagnosis not present

## 2020-02-23 DIAGNOSIS — E875 Hyperkalemia: Secondary | ICD-10-CM | POA: Diagnosis not present

## 2020-02-23 DIAGNOSIS — R809 Proteinuria, unspecified: Secondary | ICD-10-CM | POA: Diagnosis not present

## 2020-02-23 DIAGNOSIS — E1122 Type 2 diabetes mellitus with diabetic chronic kidney disease: Secondary | ICD-10-CM | POA: Diagnosis not present

## 2020-02-23 DIAGNOSIS — N179 Acute kidney failure, unspecified: Secondary | ICD-10-CM | POA: Diagnosis not present

## 2020-02-24 DIAGNOSIS — N183 Chronic kidney disease, stage 3 unspecified: Secondary | ICD-10-CM | POA: Diagnosis not present

## 2020-02-24 DIAGNOSIS — D649 Anemia, unspecified: Secondary | ICD-10-CM | POA: Diagnosis not present

## 2020-02-24 DIAGNOSIS — E7849 Other hyperlipidemia: Secondary | ICD-10-CM | POA: Diagnosis not present

## 2020-02-24 DIAGNOSIS — E113513 Type 2 diabetes mellitus with proliferative diabetic retinopathy with macular edema, bilateral: Secondary | ICD-10-CM | POA: Diagnosis not present

## 2020-02-24 DIAGNOSIS — B20 Human immunodeficiency virus [HIV] disease: Secondary | ICD-10-CM | POA: Diagnosis not present

## 2020-02-24 DIAGNOSIS — M26601 Right temporomandibular joint disorder, unspecified: Secondary | ICD-10-CM | POA: Diagnosis not present

## 2020-02-24 DIAGNOSIS — I10 Essential (primary) hypertension: Secondary | ICD-10-CM | POA: Diagnosis not present

## 2020-02-24 DIAGNOSIS — N170 Acute kidney failure with tubular necrosis: Secondary | ICD-10-CM | POA: Diagnosis not present

## 2020-02-24 DIAGNOSIS — I69354 Hemiplegia and hemiparesis following cerebral infarction affecting left non-dominant side: Secondary | ICD-10-CM | POA: Diagnosis not present

## 2020-02-24 DIAGNOSIS — R809 Proteinuria, unspecified: Secondary | ICD-10-CM | POA: Diagnosis not present

## 2020-02-24 DIAGNOSIS — Z452 Encounter for adjustment and management of vascular access device: Secondary | ICD-10-CM | POA: Diagnosis not present

## 2020-02-24 DIAGNOSIS — R262 Difficulty in walking, not elsewhere classified: Secondary | ICD-10-CM | POA: Diagnosis not present

## 2020-02-24 DIAGNOSIS — R54 Age-related physical debility: Secondary | ICD-10-CM | POA: Diagnosis not present

## 2020-02-24 DIAGNOSIS — I130 Hypertensive heart and chronic kidney disease with heart failure and stage 1 through stage 4 chronic kidney disease, or unspecified chronic kidney disease: Secondary | ICD-10-CM | POA: Diagnosis not present

## 2020-02-24 DIAGNOSIS — E875 Hyperkalemia: Secondary | ICD-10-CM | POA: Diagnosis not present

## 2020-02-24 DIAGNOSIS — D8481 Immunodeficiency due to conditions classified elsewhere: Secondary | ICD-10-CM | POA: Diagnosis not present

## 2020-02-24 DIAGNOSIS — E1121 Type 2 diabetes mellitus with diabetic nephropathy: Secondary | ICD-10-CM | POA: Diagnosis not present

## 2020-02-24 DIAGNOSIS — Z9181 History of falling: Secondary | ICD-10-CM | POA: Diagnosis not present

## 2020-02-24 DIAGNOSIS — E261 Secondary hyperaldosteronism: Secondary | ICD-10-CM | POA: Diagnosis not present

## 2020-02-24 DIAGNOSIS — H6021 Malignant otitis externa, right ear: Secondary | ICD-10-CM | POA: Diagnosis not present

## 2020-02-24 DIAGNOSIS — I4891 Unspecified atrial fibrillation: Secondary | ICD-10-CM | POA: Diagnosis not present

## 2020-02-24 DIAGNOSIS — E1151 Type 2 diabetes mellitus with diabetic peripheral angiopathy without gangrene: Secondary | ICD-10-CM | POA: Diagnosis not present

## 2020-02-24 DIAGNOSIS — N179 Acute kidney failure, unspecified: Secondary | ICD-10-CM | POA: Diagnosis not present

## 2020-02-24 DIAGNOSIS — Z20822 Contact with and (suspected) exposure to covid-19: Secondary | ICD-10-CM | POA: Diagnosis not present

## 2020-02-24 DIAGNOSIS — R2689 Other abnormalities of gait and mobility: Secondary | ICD-10-CM | POA: Diagnosis not present

## 2020-02-24 DIAGNOSIS — E1122 Type 2 diabetes mellitus with diabetic chronic kidney disease: Secondary | ICD-10-CM | POA: Diagnosis not present

## 2020-02-24 DIAGNOSIS — I509 Heart failure, unspecified: Secondary | ICD-10-CM | POA: Diagnosis not present

## 2020-02-24 DIAGNOSIS — R278 Other lack of coordination: Secondary | ICD-10-CM | POA: Diagnosis not present

## 2020-02-24 DIAGNOSIS — L0201 Cutaneous abscess of face: Secondary | ICD-10-CM | POA: Diagnosis not present

## 2020-02-24 DIAGNOSIS — D6869 Other thrombophilia: Secondary | ICD-10-CM | POA: Diagnosis not present

## 2020-02-24 DIAGNOSIS — M869 Osteomyelitis, unspecified: Secondary | ICD-10-CM | POA: Diagnosis not present

## 2020-02-24 DIAGNOSIS — M726 Necrotizing fasciitis: Secondary | ICD-10-CM | POA: Diagnosis not present

## 2020-02-24 DIAGNOSIS — I129 Hypertensive chronic kidney disease with stage 1 through stage 4 chronic kidney disease, or unspecified chronic kidney disease: Secondary | ICD-10-CM | POA: Diagnosis not present

## 2020-02-24 DIAGNOSIS — M272 Inflammatory conditions of jaws: Secondary | ICD-10-CM | POA: Diagnosis not present

## 2020-02-24 DIAGNOSIS — N189 Chronic kidney disease, unspecified: Secondary | ICD-10-CM | POA: Diagnosis not present

## 2020-02-25 DIAGNOSIS — I4891 Unspecified atrial fibrillation: Secondary | ICD-10-CM | POA: Diagnosis not present

## 2020-02-25 DIAGNOSIS — R262 Difficulty in walking, not elsewhere classified: Secondary | ICD-10-CM | POA: Diagnosis not present

## 2020-02-25 DIAGNOSIS — H6021 Malignant otitis externa, right ear: Secondary | ICD-10-CM | POA: Diagnosis not present

## 2020-03-12 DIAGNOSIS — N170 Acute kidney failure with tubular necrosis: Secondary | ICD-10-CM | POA: Diagnosis not present

## 2020-03-12 DIAGNOSIS — E1121 Type 2 diabetes mellitus with diabetic nephropathy: Secondary | ICD-10-CM | POA: Diagnosis not present

## 2020-03-12 DIAGNOSIS — I10 Essential (primary) hypertension: Secondary | ICD-10-CM | POA: Diagnosis not present

## 2020-03-14 ENCOUNTER — Encounter: Payer: Self-pay | Admitting: Internal Medicine

## 2020-03-14 ENCOUNTER — Telehealth: Payer: Self-pay

## 2020-03-14 ENCOUNTER — Ambulatory Visit (INDEPENDENT_AMBULATORY_CARE_PROVIDER_SITE_OTHER): Payer: Medicare Other | Admitting: Internal Medicine

## 2020-03-14 ENCOUNTER — Other Ambulatory Visit: Payer: Self-pay

## 2020-03-14 DIAGNOSIS — B20 Human immunodeficiency virus [HIV] disease: Secondary | ICD-10-CM | POA: Diagnosis not present

## 2020-03-14 DIAGNOSIS — H6021 Malignant otitis externa, right ear: Secondary | ICD-10-CM | POA: Diagnosis not present

## 2020-03-14 NOTE — Assessment & Plan Note (Signed)
His infection is back under excellent control.  He will continue Descovy and Tivicay.

## 2020-03-14 NOTE — Telephone Encounter (Addendum)
Dr Megan Salon would like for patient to have his Central Venous Cath removed in  [place] at [place] since patient is in a Rehab facility in [place]. I have called [facility] IR department ext [phone]) and left a message to call me back to get the patient scheduled to remove port. Patient has been informed that I will call the rehab facility [facility]) to let them know when the patient is scheduled.  Maylea Soria T Brooks Sailors

## 2020-03-14 NOTE — Progress Notes (Signed)
Patient Active Problem List   Diagnosis Date Noted  . HIV disease ([place])     Priority: High  . Malignant otitis externa of right ear 03/14/2020  . Onychomycosis 06/15/2018  . Debility 04/16/2018  . Sacral ulcer ([place]) 04/16/2018  . Hematuria 03/11/2018  . Diabetic ulcer of left foot associated with type 2 diabetes mellitus ([place]) 03/11/2018  . S/P unilateral BKA (below knee amputation), right ([place]) 02/05/2018  . Depression, major, single episode, in partial remission ([place]) 07/30/2017  . Hyperlipidemia associated with type 2 diabetes mellitus ([place]) 06/24/2017  . Hypertension associated with diabetes ([place]) 06/24/2017  . Hidradenitis 05/29/2017  . Candidal intertrigo 05/29/2017  . Stroke ([place])   . CKD (chronic kidney disease), stage III ([place]) 03/25/2017  . Type 2 diabetes mellitus with diabetic chronic kidney disease ([place]) 03/25/2017    Patient's Medications  New Prescriptions   No medications on file  Previous Medications   ACETAMINOPHEN (TYLENOL) 325 MG TABLET    Take 2 tablets (650 mg total) by mouth every 6 (six) hours as needed for mild pain, fever or headache (or Fever >/= 101).   ACETIC ACID 2 % OTIC SOLUTION    Place 6 drops into the right ear every 3 (three) hours.   AMLODIPINE (NORVASC) 5 MG TABLET    Take 1 tablet (5 mg total) by mouth daily.   AMOXICILLIN (AMOXIL) 875 MG TABLET    Take 1 tablet (875 mg total) by mouth 2 (two) times daily.   ASPIRIN EC 81 MG TABLET    Take 81 mg by mouth at bedtime.   ATORVASTATIN (LIPITOR) 40 MG TABLET    Take 1 tablet (40 mg total) by mouth daily.   COLLAGENASE (SANTYL) OINTMENT    Apply 1 application topically daily.   CYCLOBENZAPRINE (FLEXERIL) 10 MG TABLET    Take 1 tablet (10 mg total) by mouth 3 (three) times daily as needed for muscle spasms.   DESCOVY 200-25 MG TABLET    Take 1 tablet by mouth at bedtime.   DULAGLUTIDE (TRULICITY) 1.5 BZ/1.6RC SOPN    INJECT 1 PEN SUBCUTANEOUSLY ONCE A WEEK ON SUNDAY   FENOFIBRATE  (TRICOR) 48 MG TABLET    Take 1 tablet by mouth once daily   FERROUS GLUCONATE (FERGON) 324 MG TABLET    Take 1 tablet (324 mg total) by mouth daily with breakfast.   HYDROCHLOROTHIAZIDE (HYDRODIURIL) 25 MG TABLET    Take 1 tablet (25 mg total) by mouth daily.   INCONTINENCE SUPPLIES KIT    Use as needed for incontinence   JARDIANCE 10 MG TABS TABLET    TAKE 1 TABLET BY MOUTH DAILY   LANTUS SOLOSTAR 100 UNIT/ML SOLOSTAR PEN    Inject 55 Units into the skin at bedtime.   METOPROLOL TARTRATE (LOPRESSOR) 100 MG TABLET    Take 1 tablet (100 mg total) by mouth 2 (two) times daily.   SERTRALINE (ZOLOFT) 100 MG TABLET    Take 1 tablet (100 mg total) by mouth 2 (two) times daily. Takes 156m total daily.   TIVICAY 50 MG TABLET    Take 1 tablet (50 mg total) by mouth daily.  Modified Medications   No medications on file  Discontinued Medications   No medications on file    Subjective: SNicki Reaperis in for his routine follow-up visit.  [facility] malignant [facility] externa, TMJ osteomyelitis, mandibular osteomyelitis and a temporal abscess.  He was treated with IV cefepime and metronidazole through 03/03/2020.  3 teeth were removed.  He is feeling much better.  He is currently in a skilled nursing facility in [place].  He says that he thinks he was probably having trouble with nausea and vomiting at the time of his last visit here in April.  His viral load was elevated at that time but was undetectable when he was hospitalized at [facility].  He has been receiving his Descovy and Tivicay at the skilled nursing facility.  Review of Systems: Review of Systems  Constitutional: Negative for chills, diaphoresis and fever.  HENT: Positive for ear discharge. Negative for ear pain.   Respiratory: Negative for cough and shortness of breath.   Cardiovascular: Negative for chest pain.  Gastrointestinal: Negative for abdominal pain, diarrhea, nausea and vomiting.    Past Medical History:    Diagnosis Date  . Allergy   . Anxiety and depression 05/29/2017  . Diabetes mellitus without complication ([place])   . HIV (human immunodeficiency virus infection) ([place])   . Hypertension   . Stroke [facility])     Social History   Tobacco Use  . Smoking status: Never Smoker  . Smokeless tobacco: Never Used  Vaping Use  . Vaping Use: Never used  Substance Use Topics  . Alcohol use: Yes    Alcohol/week: 2.0 standard drinks    Types: 1 Glasses of wine, 1 Cans of beer per week    Comment: rare  . Drug use: No    Family History  Problem Relation Age of Onset  . Arthritis Mother   . Hypertension Mother   . Alcohol abuse Father   . Cancer Father   . Diabetes Father   . Early death Father   . Hyperlipidemia Father   . Hypertension Father   . Alcohol abuse Sister   . Heart attack Maternal Grandmother   . Heart disease Maternal Grandmother   . Hypertension Maternal Grandmother   . Cancer Maternal Grandfather        lung  . Cancer Paternal Grandmother   . Diabetes Paternal Grandmother     No Known Allergies  Health Maintenance  Topic Date Due  . COVID-19 Vaccine (1) Never done  . OPHTHALMOLOGY EXAM  02/09/2018  . FOOT EXAM  11/04/2018  . HEMOGLOBIN A1C  07/22/2019  . INFLUENZA VACCINE  03/11/2020  . TETANUS/TDAP  08/11/2024  . PNEUMOCOCCAL POLYSACCHARIDE VACCINE AGE 72-64 HIGH RISK  Completed  . Hepatitis C Screening  Completed  . HIV Screening  Completed    Objective:  Vitals:   03/14/20 0900  BP: 121/88  Pulse: 78  Temp: 98.4 F (36.9 C)  TempSrc: Oral   There is no height or weight on file to calculate BMI.  Physical Exam Constitutional:      Comments: He is pleasant and in no distress.  He is seated in his wheelchair.  HENT:     Right Ear: External ear normal.     Ears:     Comments: No visible drainage from his right ear.  No tenderness, redness or swelling of his pinna or along his jawline. Cardiovascular:     Rate and Rhythm: Normal rate and regular  rhythm.     Heart sounds: No murmur heard.   Pulmonary:     Effort: Pulmonary effort is normal.     Breath sounds: Normal breath sounds.  Chest:       Lab Results Lab Results  Component Value Date  WBC 11.1 (H) 11/30/2019   HGB 10.6 (L) 11/30/2019   HCT 32.1 (L) 11/30/2019   MCV 94.1 11/30/2019   PLT 393 11/30/2019    Lab Results  Component Value Date   CREATININE 1.50 (H) 11/30/2019   BUN 27 (H) 11/30/2019   NA 137 11/30/2019   K 4.0 11/30/2019   CL 101 11/30/2019   CO2 27 11/30/2019    Lab Results  Component Value Date   ALT 8 (L) 11/30/2019   AST 11 11/30/2019   ALKPHOS 119 01/20/2019   BILITOT 0.3 11/30/2019    Lab Results  Component Value Date   CHOL 111 11/30/2019   HDL 29 (L) 11/30/2019   LDLCALC 55 11/30/2019   LDLDIRECT 145.0 05/28/2017   TRIG 208 (H) 11/30/2019   CHOLHDL 3.8 11/30/2019   Lab Results  Component Value Date   LABRPR NON-REACTIVE 11/30/2019   HIV 1 RNA Quant (copies/mL)  Date Value  11/30/2019 59,200 (H)  05/28/2017 <20 NOT DETECTED  04/23/2017 <20 NOT DETECTED   CD4 T Cell Abs (/uL)  Date Value  11/30/2019 487  01/19/2018 300 (L)  04/23/2017 480     Problem List Items Addressed This Visit      High   HIV disease ([place]) (Chronic)    His infection is back under excellent control.  He will continue Descovy and Tivicay.        Unprioritized   Malignant otitis externa of right ear    Hopefully his severe infection has been cured.  I will have a central line removed.           Michel Bickers, MD [facility] [phone] pager   [phone] cell 03/14/2020, 9:15 AM

## 2020-03-14 NOTE — Telephone Encounter (Addendum)
Spoke with Abigail Butts in the IR dept at [facility] and she would like for the order to be faxed to  [place] IR dept to have removal of central venous cath done.  Abigail Butts will call our office back with an appointment  Mayford Alberg T Brooks Sailors

## 2020-03-14 NOTE — Telephone Encounter (Deleted)
Entered in Error

## 2020-03-14 NOTE — Telephone Encounter (Deleted)
error 

## 2020-03-14 NOTE — Assessment & Plan Note (Signed)
Hopefully his severe infection has been cured.  I will have a central line removed.

## 2020-03-15 NOTE — Telephone Encounter (Addendum)
Francisco Hutchinson called from [facility] and she has the patient scheduled for a CVC removal on 03/19/20 at 8:15 am. She has spoke with Francisco Hutchinson at the rehab facilty [facility]) to let them know about the patient's appointment. I have also called [facility] and advised them of the patient's appointment.  Francisco Hutchinson

## 2020-03-19 DIAGNOSIS — Z452 Encounter for adjustment and management of vascular access device: Secondary | ICD-10-CM | POA: Diagnosis not present

## 2020-03-19 DIAGNOSIS — H6021 Malignant otitis externa, right ear: Secondary | ICD-10-CM | POA: Diagnosis not present

## 2020-04-11 ENCOUNTER — Other Ambulatory Visit: Payer: Self-pay | Admitting: Family Medicine

## 2020-04-23 DIAGNOSIS — I10 Essential (primary) hypertension: Secondary | ICD-10-CM | POA: Diagnosis not present

## 2020-04-23 DIAGNOSIS — L89210 Pressure ulcer of right hip, unstageable: Secondary | ICD-10-CM | POA: Diagnosis not present

## 2020-04-23 DIAGNOSIS — N1832 Chronic kidney disease, stage 3b: Secondary | ICD-10-CM | POA: Diagnosis not present

## 2020-04-23 DIAGNOSIS — Z794 Long term (current) use of insulin: Secondary | ICD-10-CM | POA: Diagnosis not present

## 2020-04-23 DIAGNOSIS — I130 Hypertensive heart and chronic kidney disease with heart failure and stage 1 through stage 4 chronic kidney disease, or unspecified chronic kidney disease: Secondary | ICD-10-CM | POA: Diagnosis not present

## 2020-04-23 DIAGNOSIS — E1151 Type 2 diabetes mellitus with diabetic peripheral angiopathy without gangrene: Secondary | ICD-10-CM | POA: Diagnosis not present

## 2020-04-23 DIAGNOSIS — L89220 Pressure ulcer of left hip, unstageable: Secondary | ICD-10-CM | POA: Diagnosis not present

## 2020-04-23 DIAGNOSIS — Z8739 Personal history of other diseases of the musculoskeletal system and connective tissue: Secondary | ICD-10-CM | POA: Diagnosis not present

## 2020-04-23 DIAGNOSIS — N321 Vesicointestinal fistula: Secondary | ICD-10-CM | POA: Diagnosis not present

## 2020-04-23 DIAGNOSIS — K5641 Fecal impaction: Secondary | ICD-10-CM | POA: Diagnosis not present

## 2020-04-23 DIAGNOSIS — E11622 Type 2 diabetes mellitus with other skin ulcer: Secondary | ICD-10-CM | POA: Diagnosis not present

## 2020-04-23 DIAGNOSIS — N390 Urinary tract infection, site not specified: Secondary | ICD-10-CM | POA: Diagnosis not present

## 2020-04-23 DIAGNOSIS — E871 Hypo-osmolality and hyponatremia: Secondary | ICD-10-CM | POA: Diagnosis not present

## 2020-04-23 DIAGNOSIS — J9811 Atelectasis: Secondary | ICD-10-CM | POA: Diagnosis not present

## 2020-04-23 DIAGNOSIS — M726 Necrotizing fasciitis: Secondary | ICD-10-CM | POA: Diagnosis not present

## 2020-04-23 DIAGNOSIS — R7989 Other specified abnormal findings of blood chemistry: Secondary | ICD-10-CM | POA: Diagnosis not present

## 2020-04-23 DIAGNOSIS — E785 Hyperlipidemia, unspecified: Secondary | ICD-10-CM | POA: Diagnosis not present

## 2020-04-23 DIAGNOSIS — I517 Cardiomegaly: Secondary | ICD-10-CM | POA: Diagnosis not present

## 2020-04-23 DIAGNOSIS — D8481 Immunodeficiency due to conditions classified elsewhere: Secondary | ICD-10-CM | POA: Diagnosis not present

## 2020-04-23 DIAGNOSIS — E1165 Type 2 diabetes mellitus with hyperglycemia: Secondary | ICD-10-CM | POA: Diagnosis not present

## 2020-04-23 DIAGNOSIS — R6520 Severe sepsis without septic shock: Secondary | ICD-10-CM | POA: Diagnosis not present

## 2020-04-23 DIAGNOSIS — D649 Anemia, unspecified: Secondary | ICD-10-CM | POA: Diagnosis not present

## 2020-04-23 DIAGNOSIS — I4891 Unspecified atrial fibrillation: Secondary | ICD-10-CM | POA: Diagnosis not present

## 2020-04-23 DIAGNOSIS — L89320 Pressure ulcer of left buttock, unstageable: Secondary | ICD-10-CM | POA: Diagnosis not present

## 2020-04-23 DIAGNOSIS — Z743 Need for continuous supervision: Secondary | ICD-10-CM | POA: Diagnosis not present

## 2020-04-23 DIAGNOSIS — E1122 Type 2 diabetes mellitus with diabetic chronic kidney disease: Secondary | ICD-10-CM | POA: Diagnosis not present

## 2020-04-23 DIAGNOSIS — R293 Abnormal posture: Secondary | ICD-10-CM | POA: Diagnosis not present

## 2020-04-23 DIAGNOSIS — R278 Other lack of coordination: Secondary | ICD-10-CM | POA: Diagnosis not present

## 2020-04-23 DIAGNOSIS — L89012 Pressure ulcer of right elbow, stage 2: Secondary | ICD-10-CM | POA: Diagnosis not present

## 2020-04-23 DIAGNOSIS — U071 COVID-19: Secondary | ICD-10-CM | POA: Diagnosis not present

## 2020-04-23 DIAGNOSIS — K449 Diaphragmatic hernia without obstruction or gangrene: Secondary | ICD-10-CM | POA: Diagnosis not present

## 2020-04-23 DIAGNOSIS — E113513 Type 2 diabetes mellitus with proliferative diabetic retinopathy with macular edema, bilateral: Secondary | ICD-10-CM | POA: Diagnosis not present

## 2020-04-23 DIAGNOSIS — N170 Acute kidney failure with tubular necrosis: Secondary | ICD-10-CM | POA: Diagnosis not present

## 2020-04-23 DIAGNOSIS — I739 Peripheral vascular disease, unspecified: Secondary | ICD-10-CM | POA: Diagnosis not present

## 2020-04-23 DIAGNOSIS — L0231 Cutaneous abscess of buttock: Secondary | ICD-10-CM | POA: Diagnosis not present

## 2020-04-23 DIAGNOSIS — A419 Sepsis, unspecified organism: Secondary | ICD-10-CM | POA: Diagnosis not present

## 2020-04-23 DIAGNOSIS — R52 Pain, unspecified: Secondary | ICD-10-CM | POA: Diagnosis not present

## 2020-04-23 DIAGNOSIS — I69354 Hemiplegia and hemiparesis following cerebral infarction affecting left non-dominant side: Secondary | ICD-10-CM | POA: Diagnosis not present

## 2020-04-23 DIAGNOSIS — Z89511 Acquired absence of right leg below knee: Secondary | ICD-10-CM | POA: Diagnosis not present

## 2020-04-23 DIAGNOSIS — L03317 Cellulitis of buttock: Secondary | ICD-10-CM | POA: Diagnosis not present

## 2020-04-23 DIAGNOSIS — N183 Chronic kidney disease, stage 3 unspecified: Secondary | ICD-10-CM | POA: Diagnosis not present

## 2020-04-23 DIAGNOSIS — L89329 Pressure ulcer of left buttock, unspecified stage: Secondary | ICD-10-CM | POA: Diagnosis not present

## 2020-04-23 DIAGNOSIS — E7849 Other hyperlipidemia: Secondary | ICD-10-CM | POA: Diagnosis not present

## 2020-04-23 DIAGNOSIS — I129 Hypertensive chronic kidney disease with stage 1 through stage 4 chronic kidney disease, or unspecified chronic kidney disease: Secondary | ICD-10-CM | POA: Diagnosis not present

## 2020-04-23 DIAGNOSIS — K5289 Other specified noninfective gastroenteritis and colitis: Secondary | ICD-10-CM | POA: Diagnosis not present

## 2020-04-24 DIAGNOSIS — J9811 Atelectasis: Secondary | ICD-10-CM | POA: Diagnosis not present

## 2020-05-01 DIAGNOSIS — N183 Chronic kidney disease, stage 3 unspecified: Secondary | ICD-10-CM | POA: Diagnosis not present

## 2020-05-01 DIAGNOSIS — E7849 Other hyperlipidemia: Secondary | ICD-10-CM | POA: Diagnosis not present

## 2020-05-01 DIAGNOSIS — I4891 Unspecified atrial fibrillation: Secondary | ICD-10-CM | POA: Diagnosis not present

## 2020-05-01 DIAGNOSIS — M726 Necrotizing fasciitis: Secondary | ICD-10-CM | POA: Diagnosis not present

## 2020-05-01 DIAGNOSIS — I69354 Hemiplegia and hemiparesis following cerebral infarction affecting left non-dominant side: Secondary | ICD-10-CM | POA: Diagnosis not present

## 2020-05-01 DIAGNOSIS — L89313 Pressure ulcer of right buttock, stage 3: Secondary | ICD-10-CM | POA: Diagnosis not present

## 2020-05-01 DIAGNOSIS — Z79899 Other long term (current) drug therapy: Secondary | ICD-10-CM | POA: Diagnosis not present

## 2020-05-01 DIAGNOSIS — I130 Hypertensive heart and chronic kidney disease with heart failure and stage 1 through stage 4 chronic kidney disease, or unspecified chronic kidney disease: Secondary | ICD-10-CM | POA: Diagnosis not present

## 2020-05-01 DIAGNOSIS — R278 Other lack of coordination: Secondary | ICD-10-CM | POA: Diagnosis not present

## 2020-05-01 DIAGNOSIS — D649 Anemia, unspecified: Secondary | ICD-10-CM | POA: Diagnosis not present

## 2020-05-01 DIAGNOSIS — L89323 Pressure ulcer of left buttock, stage 3: Secondary | ICD-10-CM | POA: Diagnosis not present

## 2020-05-01 DIAGNOSIS — Z8739 Personal history of other diseases of the musculoskeletal system and connective tissue: Secondary | ICD-10-CM | POA: Diagnosis not present

## 2020-05-01 DIAGNOSIS — E441 Mild protein-calorie malnutrition: Secondary | ICD-10-CM | POA: Diagnosis not present

## 2020-05-01 DIAGNOSIS — E1151 Type 2 diabetes mellitus with diabetic peripheral angiopathy without gangrene: Secondary | ICD-10-CM | POA: Diagnosis not present

## 2020-05-01 DIAGNOSIS — D8481 Immunodeficiency due to conditions classified elsewhere: Secondary | ICD-10-CM | POA: Diagnosis not present

## 2020-05-01 DIAGNOSIS — R293 Abnormal posture: Secondary | ICD-10-CM | POA: Diagnosis not present

## 2020-05-01 DIAGNOSIS — L89210 Pressure ulcer of right hip, unstageable: Secondary | ICD-10-CM | POA: Diagnosis not present

## 2020-05-01 DIAGNOSIS — L89329 Pressure ulcer of left buttock, unspecified stage: Secondary | ICD-10-CM | POA: Diagnosis not present

## 2020-05-01 DIAGNOSIS — Z89511 Acquired absence of right leg below knee: Secondary | ICD-10-CM | POA: Diagnosis not present

## 2020-05-01 DIAGNOSIS — E113513 Type 2 diabetes mellitus with proliferative diabetic retinopathy with macular edema, bilateral: Secondary | ICD-10-CM | POA: Diagnosis not present

## 2020-05-01 DIAGNOSIS — E1122 Type 2 diabetes mellitus with diabetic chronic kidney disease: Secondary | ICD-10-CM | POA: Diagnosis not present

## 2020-05-01 DIAGNOSIS — R7989 Other specified abnormal findings of blood chemistry: Secondary | ICD-10-CM | POA: Diagnosis not present

## 2020-05-01 DIAGNOSIS — N321 Vesicointestinal fistula: Secondary | ICD-10-CM | POA: Diagnosis not present

## 2020-05-01 DIAGNOSIS — Z23 Encounter for immunization: Secondary | ICD-10-CM | POA: Diagnosis not present

## 2020-05-01 DIAGNOSIS — Z8673 Personal history of transient ischemic attack (TIA), and cerebral infarction without residual deficits: Secondary | ICD-10-CM | POA: Diagnosis not present

## 2020-05-01 DIAGNOSIS — I10 Essential (primary) hypertension: Secondary | ICD-10-CM | POA: Diagnosis not present

## 2020-05-01 DIAGNOSIS — A419 Sepsis, unspecified organism: Secondary | ICD-10-CM | POA: Diagnosis not present

## 2020-05-01 DIAGNOSIS — L89224 Pressure ulcer of left hip, stage 4: Secondary | ICD-10-CM | POA: Diagnosis not present

## 2020-05-01 DIAGNOSIS — L0390 Cellulitis, unspecified: Secondary | ICD-10-CM | POA: Diagnosis not present

## 2020-05-01 DIAGNOSIS — L89012 Pressure ulcer of right elbow, stage 2: Secondary | ICD-10-CM | POA: Diagnosis not present

## 2020-05-01 DIAGNOSIS — R262 Difficulty in walking, not elsewhere classified: Secondary | ICD-10-CM | POA: Diagnosis not present

## 2020-05-01 DIAGNOSIS — L89320 Pressure ulcer of left buttock, unstageable: Secondary | ICD-10-CM | POA: Diagnosis not present

## 2020-05-01 DIAGNOSIS — L03317 Cellulitis of buttock: Secondary | ICD-10-CM | POA: Diagnosis not present

## 2020-05-05 DIAGNOSIS — L89329 Pressure ulcer of left buttock, unspecified stage: Secondary | ICD-10-CM | POA: Diagnosis not present

## 2020-05-05 DIAGNOSIS — R262 Difficulty in walking, not elsewhere classified: Secondary | ICD-10-CM | POA: Diagnosis not present

## 2020-05-05 DIAGNOSIS — L0390 Cellulitis, unspecified: Secondary | ICD-10-CM | POA: Diagnosis not present

## 2020-05-05 DIAGNOSIS — E441 Mild protein-calorie malnutrition: Secondary | ICD-10-CM | POA: Diagnosis not present

## 2020-05-05 DIAGNOSIS — E1151 Type 2 diabetes mellitus with diabetic peripheral angiopathy without gangrene: Secondary | ICD-10-CM | POA: Diagnosis not present

## 2020-05-22 DIAGNOSIS — L89313 Pressure ulcer of right buttock, stage 3: Secondary | ICD-10-CM | POA: Diagnosis not present

## 2020-05-22 DIAGNOSIS — L89224 Pressure ulcer of left hip, stage 4: Secondary | ICD-10-CM | POA: Diagnosis not present

## 2020-05-22 DIAGNOSIS — L89323 Pressure ulcer of left buttock, stage 3: Secondary | ICD-10-CM | POA: Diagnosis not present

## 2020-05-22 DIAGNOSIS — Z8673 Personal history of transient ischemic attack (TIA), and cerebral infarction without residual deficits: Secondary | ICD-10-CM | POA: Diagnosis not present

## 2020-05-23 DIAGNOSIS — Z79899 Other long term (current) drug therapy: Secondary | ICD-10-CM | POA: Diagnosis not present

## 2020-05-24 DIAGNOSIS — Z23 Encounter for immunization: Secondary | ICD-10-CM | POA: Diagnosis not present

## 2020-06-01 DIAGNOSIS — L89224 Pressure ulcer of left hip, stage 4: Secondary | ICD-10-CM | POA: Diagnosis not present

## 2020-06-06 ENCOUNTER — Telehealth: Payer: Self-pay | Admitting: Family Medicine

## 2020-06-06 NOTE — Telephone Encounter (Signed)
NO ANSWER/NO VM ON EITHER PHONE NUMBER. Pt due to schedule Medicare Annual Wellness Visit (AWV) either virtually/audio only OR in office. Whatever the patients preference is.  No hx; please schedule at anytime with [facility]-Nurse Health Advisor at [facility].  This should be a 45 minute visit.

## 2020-06-08 DIAGNOSIS — L89329 Pressure ulcer of left buttock, unspecified stage: Secondary | ICD-10-CM | POA: Diagnosis not present

## 2020-06-08 DIAGNOSIS — R262 Difficulty in walking, not elsewhere classified: Secondary | ICD-10-CM | POA: Diagnosis not present

## 2020-06-08 DIAGNOSIS — E1151 Type 2 diabetes mellitus with diabetic peripheral angiopathy without gangrene: Secondary | ICD-10-CM | POA: Diagnosis not present

## 2020-06-14 DIAGNOSIS — R339 Retention of urine, unspecified: Secondary | ICD-10-CM | POA: Diagnosis not present

## 2020-06-19 DIAGNOSIS — M2550 Pain in unspecified joint: Secondary | ICD-10-CM | POA: Diagnosis not present

## 2020-06-19 DIAGNOSIS — E11622 Type 2 diabetes mellitus with other skin ulcer: Secondary | ICD-10-CM | POA: Diagnosis not present

## 2020-06-19 DIAGNOSIS — Z7401 Bed confinement status: Secondary | ICD-10-CM | POA: Diagnosis not present

## 2020-06-19 DIAGNOSIS — Z743 Need for continuous supervision: Secondary | ICD-10-CM | POA: Diagnosis not present

## 2020-06-19 DIAGNOSIS — L89224 Pressure ulcer of left hip, stage 4: Secondary | ICD-10-CM | POA: Diagnosis not present

## 2020-06-19 DIAGNOSIS — R5381 Other malaise: Secondary | ICD-10-CM | POA: Diagnosis not present

## 2020-06-19 DIAGNOSIS — L89313 Pressure ulcer of right buttock, stage 3: Secondary | ICD-10-CM | POA: Diagnosis not present

## 2020-06-19 DIAGNOSIS — L89323 Pressure ulcer of left buttock, stage 3: Secondary | ICD-10-CM | POA: Diagnosis not present

## 2020-06-20 DIAGNOSIS — S31829A Unspecified open wound of left buttock, initial encounter: Secondary | ICD-10-CM | POA: Diagnosis not present

## 2020-06-23 ENCOUNTER — Other Ambulatory Visit: Payer: Self-pay | Admitting: Family Medicine

## 2020-06-29 DIAGNOSIS — Z87448 Personal history of other diseases of urinary system: Secondary | ICD-10-CM | POA: Diagnosis not present

## 2020-06-29 DIAGNOSIS — L988 Other specified disorders of the skin and subcutaneous tissue: Secondary | ICD-10-CM | POA: Diagnosis not present

## 2020-07-02 DIAGNOSIS — L89224 Pressure ulcer of left hip, stage 4: Secondary | ICD-10-CM | POA: Diagnosis not present

## 2020-07-02 DIAGNOSIS — R339 Retention of urine, unspecified: Secondary | ICD-10-CM | POA: Diagnosis not present

## 2020-07-03 DIAGNOSIS — Z79899 Other long term (current) drug therapy: Secondary | ICD-10-CM | POA: Diagnosis not present

## 2020-07-03 DIAGNOSIS — L89224 Pressure ulcer of left hip, stage 4: Secondary | ICD-10-CM | POA: Diagnosis not present

## 2020-07-03 DIAGNOSIS — L89323 Pressure ulcer of left buttock, stage 3: Secondary | ICD-10-CM | POA: Diagnosis not present

## 2020-07-03 DIAGNOSIS — E46 Unspecified protein-calorie malnutrition: Secondary | ICD-10-CM | POA: Diagnosis not present

## 2020-07-03 DIAGNOSIS — Z8673 Personal history of transient ischemic attack (TIA), and cerebral infarction without residual deficits: Secondary | ICD-10-CM | POA: Diagnosis not present

## 2020-07-07 ENCOUNTER — Other Ambulatory Visit: Payer: Self-pay | Admitting: Family Medicine

## 2020-07-08 ENCOUNTER — Other Ambulatory Visit: Payer: Self-pay | Admitting: Family Medicine

## 2020-07-24 DIAGNOSIS — E11622 Type 2 diabetes mellitus with other skin ulcer: Secondary | ICD-10-CM | POA: Diagnosis not present

## 2020-07-24 DIAGNOSIS — L89323 Pressure ulcer of left buttock, stage 3: Secondary | ICD-10-CM | POA: Diagnosis not present

## 2020-07-24 DIAGNOSIS — L89313 Pressure ulcer of right buttock, stage 3: Secondary | ICD-10-CM | POA: Diagnosis not present

## 2020-07-24 DIAGNOSIS — L89224 Pressure ulcer of left hip, stage 4: Secondary | ICD-10-CM | POA: Diagnosis not present

## 2020-07-30 DIAGNOSIS — L89153 Pressure ulcer of sacral region, stage 3: Secondary | ICD-10-CM | POA: Diagnosis not present

## 2020-08-01 ENCOUNTER — Emergency Department (HOSPITAL_COMMUNITY): Payer: Medicare Other

## 2020-08-01 ENCOUNTER — Inpatient Hospital Stay (HOSPITAL_COMMUNITY)
Admission: EM | Admit: 2020-08-01 | Discharge: 2020-08-16 | DRG: 969 | Disposition: A | Discharge status: Other Acute Inpatient Hospital | Payer: Medicare Other | Attending: Family Medicine | Admitting: Family Medicine

## 2020-08-01 ENCOUNTER — Encounter (HOSPITAL_COMMUNITY)
Admission: EM | Disposition: A | Discharge status: Other Acute Inpatient Hospital | Payer: Self-pay | Source: Home / Self Care | Attending: Internal Medicine

## 2020-08-01 ENCOUNTER — Encounter (HOSPITAL_COMMUNITY): Payer: Self-pay | Admitting: Emergency Medicine

## 2020-08-01 DIAGNOSIS — A4159 Other Gram-negative sepsis: Secondary | ICD-10-CM | POA: Diagnosis not present

## 2020-08-01 DIAGNOSIS — I480 Paroxysmal atrial fibrillation: Secondary | ICD-10-CM | POA: Diagnosis not present

## 2020-08-01 DIAGNOSIS — L02211 Cutaneous abscess of abdominal wall: Secondary | ICD-10-CM | POA: Diagnosis present

## 2020-08-01 DIAGNOSIS — L89626 Pressure-induced deep tissue damage of left heel: Secondary | ICD-10-CM | POA: Diagnosis present

## 2020-08-01 DIAGNOSIS — R52 Pain, unspecified: Secondary | ICD-10-CM | POA: Diagnosis not present

## 2020-08-01 DIAGNOSIS — Z20822 Contact with and (suspected) exposure to covid-19: Secondary | ICD-10-CM | POA: Diagnosis present

## 2020-08-01 DIAGNOSIS — N4821 Abscess of corpus cavernosum and penis: Secondary | ICD-10-CM | POA: Diagnosis not present

## 2020-08-01 DIAGNOSIS — I4819 Other persistent atrial fibrillation: Secondary | ICD-10-CM | POA: Diagnosis not present

## 2020-08-01 DIAGNOSIS — I129 Hypertensive chronic kidney disease with stage 1 through stage 4 chronic kidney disease, or unspecified chronic kidney disease: Secondary | ICD-10-CM | POA: Diagnosis not present

## 2020-08-01 DIAGNOSIS — Z794 Long term (current) use of insulin: Secondary | ICD-10-CM | POA: Diagnosis not present

## 2020-08-01 DIAGNOSIS — N493 Fournier gangrene: Secondary | ICD-10-CM | POA: Diagnosis present

## 2020-08-01 DIAGNOSIS — R6520 Severe sepsis without septic shock: Secondary | ICD-10-CM | POA: Diagnosis not present

## 2020-08-01 DIAGNOSIS — R000 Tachycardia, unspecified: Secondary | ICD-10-CM

## 2020-08-01 DIAGNOSIS — Z8261 Family history of arthritis: Secondary | ICD-10-CM

## 2020-08-01 DIAGNOSIS — Z743 Need for continuous supervision: Secondary | ICD-10-CM | POA: Diagnosis not present

## 2020-08-01 DIAGNOSIS — Z89511 Acquired absence of right leg below knee: Secondary | ICD-10-CM | POA: Diagnosis not present

## 2020-08-01 DIAGNOSIS — I10 Essential (primary) hypertension: Secondary | ICD-10-CM | POA: Diagnosis not present

## 2020-08-01 DIAGNOSIS — Z809 Family history of malignant neoplasm, unspecified: Secondary | ICD-10-CM

## 2020-08-01 DIAGNOSIS — E875 Hyperkalemia: Secondary | ICD-10-CM | POA: Diagnosis not present

## 2020-08-01 DIAGNOSIS — N1831 Chronic kidney disease, stage 3a: Secondary | ICD-10-CM | POA: Diagnosis present

## 2020-08-01 DIAGNOSIS — Z833 Family history of diabetes mellitus: Secondary | ICD-10-CM

## 2020-08-01 DIAGNOSIS — D631 Anemia in chronic kidney disease: Secondary | ICD-10-CM | POA: Diagnosis present

## 2020-08-01 DIAGNOSIS — F32A Depression, unspecified: Secondary | ICD-10-CM | POA: Diagnosis not present

## 2020-08-01 DIAGNOSIS — R509 Fever, unspecified: Secondary | ICD-10-CM | POA: Diagnosis not present

## 2020-08-01 DIAGNOSIS — D72829 Elevated white blood cell count, unspecified: Secondary | ICD-10-CM | POA: Diagnosis not present

## 2020-08-01 DIAGNOSIS — Z960 Presence of urogenital implants: Secondary | ICD-10-CM | POA: Diagnosis not present

## 2020-08-01 DIAGNOSIS — F419 Anxiety disorder, unspecified: Secondary | ICD-10-CM | POA: Diagnosis present

## 2020-08-01 DIAGNOSIS — Z8249 Family history of ischemic heart disease and other diseases of the circulatory system: Secondary | ICD-10-CM

## 2020-08-01 DIAGNOSIS — M726 Necrotizing fasciitis: Secondary | ICD-10-CM | POA: Diagnosis present

## 2020-08-01 DIAGNOSIS — I4891 Unspecified atrial fibrillation: Secondary | ICD-10-CM | POA: Diagnosis not present

## 2020-08-01 DIAGNOSIS — R6521 Severe sepsis with septic shock: Secondary | ICD-10-CM | POA: Diagnosis present

## 2020-08-01 DIAGNOSIS — I5022 Chronic systolic (congestive) heart failure: Secondary | ICD-10-CM | POA: Diagnosis not present

## 2020-08-01 DIAGNOSIS — Z8673 Personal history of transient ischemic attack (TIA), and cerebral infarction without residual deficits: Secondary | ICD-10-CM

## 2020-08-01 DIAGNOSIS — E876 Hypokalemia: Secondary | ICD-10-CM | POA: Diagnosis present

## 2020-08-01 DIAGNOSIS — E871 Hypo-osmolality and hyponatremia: Secondary | ICD-10-CM | POA: Diagnosis present

## 2020-08-01 DIAGNOSIS — K8689 Other specified diseases of pancreas: Secondary | ICD-10-CM | POA: Diagnosis not present

## 2020-08-01 DIAGNOSIS — I959 Hypotension, unspecified: Secondary | ICD-10-CM

## 2020-08-01 DIAGNOSIS — B952 Enterococcus as the cause of diseases classified elsewhere: Secondary | ICD-10-CM | POA: Diagnosis not present

## 2020-08-01 DIAGNOSIS — E872 Acidosis: Secondary | ICD-10-CM | POA: Diagnosis not present

## 2020-08-01 DIAGNOSIS — Z6831 Body mass index (BMI) 31.0-31.9, adult: Secondary | ICD-10-CM

## 2020-08-01 DIAGNOSIS — A419 Sepsis, unspecified organism: Secondary | ICD-10-CM | POA: Diagnosis not present

## 2020-08-01 DIAGNOSIS — N179 Acute kidney failure, unspecified: Secondary | ICD-10-CM | POA: Diagnosis not present

## 2020-08-01 DIAGNOSIS — K5900 Constipation, unspecified: Secondary | ICD-10-CM | POA: Diagnosis not present

## 2020-08-01 DIAGNOSIS — E1122 Type 2 diabetes mellitus with diabetic chronic kidney disease: Secondary | ICD-10-CM | POA: Diagnosis present

## 2020-08-01 DIAGNOSIS — Z21 Asymptomatic human immunodeficiency virus [HIV] infection status: Secondary | ICD-10-CM | POA: Diagnosis not present

## 2020-08-01 DIAGNOSIS — I420 Dilated cardiomyopathy: Secondary | ICD-10-CM | POA: Diagnosis not present

## 2020-08-01 DIAGNOSIS — R110 Nausea: Secondary | ICD-10-CM | POA: Diagnosis not present

## 2020-08-01 DIAGNOSIS — Z7982 Long term (current) use of aspirin: Secondary | ICD-10-CM

## 2020-08-01 DIAGNOSIS — Z993 Dependence on wheelchair: Secondary | ICD-10-CM

## 2020-08-01 DIAGNOSIS — D649 Anemia, unspecified: Secondary | ICD-10-CM | POA: Diagnosis not present

## 2020-08-01 DIAGNOSIS — E669 Obesity, unspecified: Secondary | ICD-10-CM | POA: Diagnosis present

## 2020-08-01 DIAGNOSIS — Z811 Family history of alcohol abuse and dependence: Secondary | ICD-10-CM

## 2020-08-01 DIAGNOSIS — Z83438 Family history of other disorder of lipoprotein metabolism and other lipidemia: Secondary | ICD-10-CM

## 2020-08-01 DIAGNOSIS — B20 Human immunodeficiency virus [HIV] disease: Secondary | ICD-10-CM | POA: Diagnosis present

## 2020-08-01 DIAGNOSIS — I429 Cardiomyopathy, unspecified: Secondary | ICD-10-CM | POA: Diagnosis not present

## 2020-08-01 DIAGNOSIS — Z79899 Other long term (current) drug therapy: Secondary | ICD-10-CM

## 2020-08-01 DIAGNOSIS — N492 Inflammatory disorders of scrotum: Secondary | ICD-10-CM | POA: Diagnosis not present

## 2020-08-01 DIAGNOSIS — L89224 Pressure ulcer of left hip, stage 4: Secondary | ICD-10-CM | POA: Diagnosis not present

## 2020-08-01 DIAGNOSIS — E785 Hyperlipidemia, unspecified: Secondary | ICD-10-CM | POA: Diagnosis present

## 2020-08-01 DIAGNOSIS — L02215 Cutaneous abscess of perineum: Secondary | ICD-10-CM | POA: Diagnosis present

## 2020-08-01 DIAGNOSIS — Y838 Other surgical procedures as the cause of abnormal reaction of the patient, or of later complication, without mention of misadventure at the time of the procedure: Secondary | ICD-10-CM | POA: Diagnosis not present

## 2020-08-01 DIAGNOSIS — K4020 Bilateral inguinal hernia, without obstruction or gangrene, not specified as recurrent: Secondary | ICD-10-CM | POA: Diagnosis present

## 2020-08-01 DIAGNOSIS — L8990 Pressure ulcer of unspecified site, unspecified stage: Secondary | ICD-10-CM | POA: Insufficient documentation

## 2020-08-01 DIAGNOSIS — I5020 Unspecified systolic (congestive) heart failure: Secondary | ICD-10-CM | POA: Diagnosis not present

## 2020-08-01 DIAGNOSIS — I130 Hypertensive heart and chronic kidney disease with heart failure and stage 1 through stage 4 chronic kidney disease, or unspecified chronic kidney disease: Secondary | ICD-10-CM | POA: Diagnosis present

## 2020-08-01 DIAGNOSIS — K861 Other chronic pancreatitis: Secondary | ICD-10-CM | POA: Diagnosis present

## 2020-08-01 DIAGNOSIS — T8143XA Infection following a procedure, organ and space surgical site, initial encounter: Secondary | ICD-10-CM | POA: Diagnosis not present

## 2020-08-01 DIAGNOSIS — E119 Type 2 diabetes mellitus without complications: Secondary | ICD-10-CM | POA: Diagnosis not present

## 2020-08-01 DIAGNOSIS — N3289 Other specified disorders of bladder: Secondary | ICD-10-CM | POA: Diagnosis not present

## 2020-08-01 DIAGNOSIS — E1151 Type 2 diabetes mellitus with diabetic peripheral angiopathy without gangrene: Secondary | ICD-10-CM | POA: Diagnosis not present

## 2020-08-01 DIAGNOSIS — N183 Chronic kidney disease, stage 3 unspecified: Secondary | ICD-10-CM | POA: Diagnosis not present

## 2020-08-01 HISTORY — DX: Hyperlipidemia, unspecified: E78.5

## 2020-08-01 HISTORY — PX: WOUND EXPLORATION: SHX6188

## 2020-08-01 HISTORY — PX: CYSTOSCOPY WITH STENT PLACEMENT: SHX5790

## 2020-08-01 HISTORY — DX: Unspecified atrial fibrillation: I48.91

## 2020-08-01 LAB — COMPREHENSIVE METABOLIC PANEL
ALT: 12 U/L (ref 0–44)
AST: 17 U/L (ref 15–41)
Albumin: 1.7 g/dL — ABNORMAL LOW (ref 3.5–5.0)
Alkaline Phosphatase: 103 U/L (ref 38–126)
Anion gap: 11 (ref 5–15)
BUN: 30 mg/dL — ABNORMAL HIGH (ref 6–20)
CO2: 24 mmol/L (ref 22–32)
Calcium: 7.7 mg/dL — ABNORMAL LOW (ref 8.9–10.3)
Chloride: 96 mmol/L — ABNORMAL LOW (ref 98–111)
Creatinine, Ser: 1.51 mg/dL — ABNORMAL HIGH (ref 0.61–1.24)
GFR, Estimated: 57 mL/min — ABNORMAL LOW (ref >60)
Glucose, Bld: 143 mg/dL — ABNORMAL HIGH (ref 70–99)
Potassium: 3.1 mmol/L — ABNORMAL LOW (ref 3.5–5.1)
Sodium: 131 mmol/L — ABNORMAL LOW (ref 135–145)
Total Bilirubin: 0.8 mg/dL (ref 0.3–1.2)
Total Protein: 6.2 g/dL — ABNORMAL LOW (ref 6.5–8.1)

## 2020-08-01 LAB — CBC WITH DIFFERENTIAL/PLATELET
Abs Immature Granulocytes: 0 10*3/uL (ref 0.00–0.07)
Basophils Absolute: 0 10*3/uL (ref 0.0–0.1)
Basophils Relative: 0 %
Eosinophils Absolute: 0 10*3/uL (ref 0.0–0.5)
Eosinophils Relative: 0 %
HCT: 28.1 % — ABNORMAL LOW (ref 39.0–52.0)
Hemoglobin: 8.7 g/dL — ABNORMAL LOW (ref 13.0–17.0)
Lymphocytes Relative: 3 %
Lymphs Abs: 0.8 10*3/uL (ref 0.7–4.0)
MCH: 27.3 pg (ref 26.0–34.0)
MCHC: 31 g/dL (ref 30.0–36.0)
MCV: 88.1 fL (ref 80.0–100.0)
Monocytes Absolute: 1.1 10*3/uL — ABNORMAL HIGH (ref 0.1–1.0)
Monocytes Relative: 4 %
Neutro Abs: 25.6 10*3/uL — ABNORMAL HIGH (ref 1.7–7.7)
Neutrophils Relative %: 93 %
Platelets: 449 10*3/uL — ABNORMAL HIGH (ref 150–400)
RBC: 3.19 MIL/uL — ABNORMAL LOW (ref 4.22–5.81)
RDW: 17 % — ABNORMAL HIGH (ref 11.5–15.5)
WBC: 27.5 10*3/uL — ABNORMAL HIGH (ref 4.0–10.5)
nRBC: 0 % (ref 0.0–0.2)
nRBC: 0 /100 WBC

## 2020-08-01 LAB — LACTIC ACID, PLASMA
Lactic Acid, Venous: 2.3 mmol/L (ref 0.5–1.9)
Lactic Acid, Venous: 1.2 mmol/L (ref 0.5–1.9)

## 2020-08-01 LAB — POC OCCULT BLOOD, ED: Fecal Occult Bld: NEGATIVE

## 2020-08-01 SURGERY — WOUND EXPLORATION
Anesthesia: General | Site: Bladder

## 2020-08-01 MED ORDER — LACTATED RINGERS IV BOLUS (SEPSIS)
500.0000 mL | Freq: Once | INTRAVENOUS | Status: AC
  Administered 2020-08-01: 23:00:00 500 mL via INTRAVENOUS

## 2020-08-01 MED ORDER — SODIUM CHLORIDE 0.9 % IV SOLN
2.0000 g | Freq: Once | INTRAVENOUS | Status: AC
  Administered 2020-08-01: 22:00:00 2 g via INTRAVENOUS
  Filled 2020-08-01: qty 2

## 2020-08-01 MED ORDER — VANCOMYCIN HCL IN DEXTROSE 1-5 GM/200ML-% IV SOLN
1000.0000 mg | Freq: Once | INTRAVENOUS | Status: AC
  Administered 2020-08-01: 23:00:00 1000 mg via INTRAVENOUS
  Filled 2020-08-01: qty 200

## 2020-08-01 MED ORDER — SODIUM CHLORIDE 0.9 % IV SOLN: 2.0000 g | Freq: Three times a day (TID) | INTRAVENOUS | Status: DC

## 2020-08-01 MED ORDER — IOHEXOL 300 MG/ML  SOLN
100.0000 mL | Freq: Once | INTRAMUSCULAR | Status: AC | PRN
  Administered 2020-08-01: 100 mL via INTRAVENOUS

## 2020-08-01 MED ORDER — LACTATED RINGERS IV BOLUS (SEPSIS)
1000.0000 mL | Freq: Once | INTRAVENOUS | Status: AC
  Administered 2020-08-01: 23:00:00 1000 mL via INTRAVENOUS

## 2020-08-01 MED ORDER — LACTATED RINGERS IV BOLUS (SEPSIS)
1000.0000 mL | Freq: Once | INTRAVENOUS | Status: AC
  Administered 2020-08-01: 22:00:00 1000 mL via INTRAVENOUS

## 2020-08-01 MED ORDER — CLINDAMYCIN PHOSPHATE 600 MG/50ML IV SOLN
600.0000 mg | Freq: Once | INTRAVENOUS | Status: AC
  Administered 2020-08-01: 23:00:00 600 mg via INTRAVENOUS
  Filled 2020-08-01: qty 50

## 2020-08-01 MED ORDER — LACTATED RINGERS IV SOLN: INTRAVENOUS | Status: AC

## 2020-08-01 SURGICAL SUPPLY — 49 items
BAG DECANTER FOR FLEXI CONT (MISCELLANEOUS) ×3 IMPLANT
BLADE SURG 15 STRL LF DISP TIS (BLADE) ×2 IMPLANT
BLADE SURG 15 STRL SS (BLADE) ×1
BNDG GAUZE ELAST 4 BULKY (GAUZE/BANDAGES/DRESSINGS) ×3 IMPLANT
CATH FOLEY 2W COUNCIL 5CC 16FR (CATHETERS) ×3 IMPLANT
CATH INTERMIT  6FR 70CM (CATHETERS) ×3 IMPLANT
CLEANER TIP ELECTROSURG 2X2 (MISCELLANEOUS) ×6 IMPLANT
COVER SURGICAL LIGHT HANDLE (MISCELLANEOUS) ×6 IMPLANT
COVER WAND RF STERILE (DRAPES) ×3 IMPLANT
DRAPE HALF SHEET 40X57 (DRAPES) IMPLANT
DRAPE LAPAROTOMY T 102X78X121 (DRAPES) IMPLANT
ELECT CAUTERY BLADE 6.4 (BLADE) ×3 IMPLANT
ELECT REM PT RETURN 9FT ADLT (ELECTROSURGICAL) ×3
ELECTRODE REM PT RTRN 9FT ADLT (ELECTROSURGICAL) ×2 IMPLANT
GAUZE 4X4 16PLY RFD (DISPOSABLE) ×3 IMPLANT
GAUZE SPONGE 4X4 16PLY XRAY LF (GAUZE/BANDAGES/DRESSINGS) ×3 IMPLANT
GLOVE BIO SURGEON STRL SZ7.5 (GLOVE) ×3 IMPLANT
GOWN STRL REUS W/ TWL LRG LVL3 (GOWN DISPOSABLE) ×4 IMPLANT
GOWN STRL REUS W/TWL LRG LVL3 (GOWN DISPOSABLE) ×2
GUIDEWIRE STR DUAL SENSOR (WIRE) ×3 IMPLANT
HANDPIECE INTERPULSE COAX TIP (DISPOSABLE)
KIT BASIN OR (CUSTOM PROCEDURE TRAY) ×3 IMPLANT
KIT TURNOVER KIT B (KITS) ×3 IMPLANT
LEGGING LITHOTOMY PAIR STRL (DRAPES) IMPLANT
NEEDLE HYPO 25X1 1.5 SAFETY (NEEDLE) IMPLANT
NS IRRIG 1000ML POUR BTL (IV SOLUTION) ×3 IMPLANT
PACK CYSTO (CUSTOM PROCEDURE TRAY) ×3 IMPLANT
PACK SURGICAL SETUP 50X90 (CUSTOM PROCEDURE TRAY) ×3 IMPLANT
PAD ABD 8X10 STRL (GAUZE/BANDAGES/DRESSINGS) ×3 IMPLANT
PAD ARMBOARD 7.5X6 YLW CONV (MISCELLANEOUS) ×6 IMPLANT
PENCIL BUTTON HOLSTER BLD 10FT (ELECTRODE) ×3 IMPLANT
SET HNDPC FAN SPRY TIP SCT (DISPOSABLE) IMPLANT
SHEET LAVH (DRAPES) ×3 IMPLANT
SPONGE LAP 18X18 RF (DISPOSABLE) ×3 IMPLANT
SPONGE LAP 4X18 RFD (DISPOSABLE) IMPLANT
SUT CHROMIC 3 0 PS 2 (SUTURE) IMPLANT
SUT CHROMIC 3 0 SH 27 (SUTURE) IMPLANT
SUT CHROMIC 4 0 RB 1X27 (SUTURE) IMPLANT
SUT VIC AB 2-0 SH 27 (SUTURE)
SUT VIC AB 2-0 SH 27XBRD (SUTURE) IMPLANT
SUT VIC AB 3-0 SH 27 (SUTURE)
SUT VIC AB 3-0 SH 27X BRD (SUTURE) IMPLANT
SYR CONTROL 10ML LL (SYRINGE) ×3 IMPLANT
SYR TOOMEY IRRIG 70ML (MISCELLANEOUS) ×3
SYRINGE TOOMEY IRRIG 70ML (MISCELLANEOUS) ×2 IMPLANT
TOWEL GREEN STERILE FF (TOWEL DISPOSABLE) ×3 IMPLANT
TUBE CONNECTING 12X1/4 (SUCTIONS) ×9 IMPLANT
WATER STERILE IRR 1000ML POUR (IV SOLUTION) ×3 IMPLANT
YANKAUER SUCT BULB TIP NO VENT (SUCTIONS) ×3 IMPLANT

## 2020-08-01 NOTE — H&P (View-Only) (Signed)
Urology Consult   Physician requesting consult: Peter Messick, MD  Reason for consult: Fournier's Gangrene  History of Present Illness: Francisco Hutchinson is a 49 y.o. 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   He reports that the pain in his groin and penis started earlier today when nursing staff at his facility noted discharge from his penis. He denies associated fevers, chills, nausea, vomiting, difficulty with catheter. He has had an indwelling foley catheter in since 04/2020 when he was hospitalized for left hip infection, and it was last changed a few weeks ago per his report. He was on a longterm course of IV antibiotics for his hip infection which he has since completed and he has been seeing wound care with improvement of his decubitus ulcers/wounds. He reported he has seen a urologist in 06/2020 due to gross hematuria and concern for possible colovesical fistula which was not confirmed with any work up.  He has a history of fourniers with perirectal and perineal fasciitis that involved the periurethral area around the bulb in 2018 requiring several debridements with urology. He is not currently on anticoagulation for his afib and only takes ASA. He reports his blood glucose has been well controlled recently.   I reviewed the history and physical with the patient.  He has a penile abscess draining at the left base.  There was some induration tracking a few centimeters suprapubically.  The scrotum and perineum were normal.  I reviewed complete risks pros and cons and the need for surgical drainage.  He had been already consented.  I agree with all the findings noted  Past Medical History:  Diagnosis Date   Allergy    Anxiety and depression 05/29/2017   Diabetes mellitus without complication  (HCC)    HIV (human immunodeficiency virus infection) (HCC)    Hypertension    Stroke (HCC)     Past Surgical History:  Procedure Laterality Date   ABDOMINAL AORTOGRAM W/LOWER EXTREMITY N/A 01/20/2018   Procedure: ABDOMINAL AORTOGRAM W/LOWER EXTREMITY;  Surgeon: Cain, Brandon Christopher, MD;  Location: [facility];  Service: Cardiovascular;  Laterality: N/A;   abscess removed from back and left armpit     AMPUTATION Right 01/22/2018   Procedure: RIGHT BELOW KNEE AMPUTATION;  Surgeon: Duda, Marcus V, MD;  Location: [facility];  Service: Orthopedics;  Laterality: Right;   IRRIGATION AND DEBRIDEMENT BUTTOCKS Left 03/23/2017   Procedure: IRRIGATION AND DEBRIDEMENT LEFT BUTTOCK ABSCESS;  Surgeon: Hoxworth, Benjamin, MD;  Location: [facility];  Service: General;  Laterality: Left;   IRRIGATION AND DEBRIDEMENT BUTTOCKS N/A 03/25/2017   Procedure: IRRIGATION AND DEBRIDEMENT DRESSING CHANGE AND EXPLORATION OF PERINEAL WOUND; CYSTOSCOPY;  Surgeon: Hoxworth, Benjamin, MD;  Location: [facility];  Service: General;  Laterality: N/A;   PARS PLANA VITRECTOMY 27 GAUGE WITH 25 GAUGE PORT Left 08/31/2017   Surgery Right Arm      Current Hospital Medications:  Home Meds:  No current facility-administered medications on file prior to encounter.   Current Outpatient Medications on File Prior to Encounter  Medication Sig Dispense Refill   acetaminophen (TYLENOL) 325 MG tablet Take 2 tablets (650 mg total) by mouth every 6 (six) hours as needed for mild pain, fever or headache (or Fever >/= 101).     amLODipine (NORVASC) 5 MG tablet TAKE 1 TABLET   BY MOUTH EVERY DAY (Patient taking differently: Take 5 mg by mouth daily.) 90 tablet 3   aspirin EC 81 MG tablet Take 81 mg by mouth at bedtime.     atorvastatin (LIPITOR) 40 MG tablet Take 1 tablet (40 mg total) by mouth daily. 90 tablet 3   cyclobenzaprine (FLEXERIL) 10 MG tablet Take 1 tablet (10 mg total) by mouth 3 (three) times daily as needed for muscle spasms. 30  tablet 0   DESCOVY 200-25 MG tablet Take 1 tablet by mouth at bedtime. 90 tablet 3   Dulaglutide (TRULICITY) 1.5 MG/0.5ML SOPN INJECT 1 PEN SUBCUTANEOUSLY ONCE A WEEK ON SUNDAY (Patient taking differently: Inject 1.5 mg into the skin once a week. Take on Sundays) 2 pen 3   ferrous gluconate (FERGON) 324 MG tablet Take 1 tablet (324 mg total) by mouth daily with breakfast. 30 tablet 0   hydrochlorothiazide (HYDRODIURIL) 25 MG tablet TAKE 1 TABLET BY MOUTH EVERY DAY (Patient taking differently: Take 25 mg by mouth daily.) 90 tablet 2   Incontinence Supplies KIT Use as needed for incontinence 1 each 0   JARDIANCE 10 MG TABS tablet TAKE 1 TABLET BY MOUTH EVERY DAY (Patient taking differently: Take 10 mg by mouth daily.) 90 tablet 0   LANTUS SOLOSTAR 100 UNIT/ML Solostar Pen Inject 55 Units into the skin at bedtime. 45 mL 3   metoprolol tartrate (LOPRESSOR) 100 MG tablet Take 1 tablet (100 mg total) by mouth 2 (two) times daily. 180 tablet 1   sertraline (ZOLOFT) 100 MG tablet TAKE 1 TABLET BY MOUTH TWICE A DAY (Patient taking differently: Take 100 mg by mouth daily.) 180 tablet 3   TIVICAY 50 MG tablet Take 1 tablet (50 mg total) by mouth daily. 90 tablet 3   acetic acid 2 % otic solution Place 6 drops into the right ear every 3 (three) hours. (Patient not taking: No sig reported) 15 mL 0   amoxicillin (AMOXIL) 875 MG tablet Take 1 tablet (875 mg total) by mouth 2 (two) times daily. (Patient not taking: No sig reported) 14 tablet 0   collagenase (SANTYL) ointment Apply 1 application topically daily. (Patient not taking: No sig reported) 30 g 1   fenofibrate (TRICOR) 48 MG tablet Take 1 tablet by mouth once daily (Patient not taking: Reported on 08/01/2020) 90 tablet 0   fenofibrate micronized (ANTARA) 43 MG capsule TAKE 1 CAPSULE BY MOUTH ONCE DAILY BEFORE BREAKFAST (Patient not taking: No sig reported) 90 capsule 1     Scheduled Meds:  Continuous Infusions:  [START ON 08/02/2020] ceFEPime  (MAXIPIME) IV     lactated ringers 150 mL/hr at 08/01/20 2248   vancomycin 1,000 mg (08/01/20 2238)   PRN Meds:.  Allergies: No Known Allergies  Family History  Problem Relation Age of Onset   Arthritis Mother    Hypertension Mother    Alcohol abuse Father    Cancer Father    Diabetes Father    Early death Father    Hyperlipidemia Father    Hypertension Father    Alcohol abuse Sister    Heart attack Maternal Grandmother    Heart disease Maternal Grandmother    Hypertension Maternal Grandmother    Cancer Maternal Grandfather        lung   Cancer Paternal Grandmother    Diabetes Paternal Grandmother     Social History:  reports that he has never smoked. He has never used smokeless tobacco. He reports current alcohol use of about 2.0 standard   drinks of alcohol per week. He reports that he does not use drugs.  ROS: A complete review of systems was performed.  All systems are negative except for pertinent findings as noted.  Physical Exam:  Vital signs in last 24 hours: Temp:  [98.7 F (37.1 C)-99.3 F (37.4 C)] 98.7 F (37.1 C) (12/22 2008) Pulse Rate:  [28-135] 126 (12/22 2245) Resp:  [9-20] 9 (12/22 2245) BP: (88-103)/(60-75) 94/71 (12/22 2245) SpO2:  [95 %-100 %] 100 % (12/22 2245) Constitutional:  Alert and oriented, No acute distress Cardiovascular: Regular rate and rhythm, No JVD Respiratory: Normal respiratory effort, Lungs clear bilaterally GI: Abdomen is soft, nontender, nondistended, no abdominal masses GU: Catheter in place draining clear yellow urine with erosion of ventral meatus/distal shaft of penis. Partially buried penis with crepitus of suprapubic fat pad and penile shaft. Fluctuance not discernable due to overall swelling and anatomy. Several open ulcers with purulent discharge at base and along shaft of penis. Overall edematous and indurated, less tender than expected. Scrotum relatively non tender until base of penis is reached.  Lymphatic: No  lymphadenopathy Neurologic: Grossly intact, no focal deficits Psychiatric: Normal mood and affect  Laboratory Data:  Recent Labs    08/01/20 1528  WBC 27.5*  HGB 8.7*  HCT 28.1*  PLT 449*    Recent Labs    08/01/20 1528  NA 131*  K 3.1*  CL 96*  GLUCOSE 143*  BUN 30*  CALCIUM 7.7*  CREATININE 1.51*     Results for orders placed or performed during the hospital encounter of 08/01/20 (from the past 24 hour(s))  Comprehensive metabolic panel     Status: Abnormal   Collection Time: 08/01/20  3:28 PM  Result Value Ref Range   Sodium 131 (L) 135 - 145 mmol/L   Potassium 3.1 (L) 3.5 - 5.1 mmol/L   Chloride 96 (L) 98 - 111 mmol/L   CO2 24 22 - 32 mmol/L   Glucose, Bld 143 (H) 70 - 99 mg/dL   BUN 30 (H) 6 - 20 mg/dL   Creatinine, Ser 1.51 (H) 0.61 - 1.24 mg/dL   Calcium 7.7 (L) 8.9 - 10.3 mg/dL   Total Protein 6.2 (L) 6.5 - 8.1 g/dL   Albumin 1.7 (L) 3.5 - 5.0 g/dL   AST 17 15 - 41 U/L   ALT 12 0 - 44 U/L   Alkaline Phosphatase 103 38 - 126 U/L   Total Bilirubin 0.8 0.3 - 1.2 mg/dL   GFR, Estimated 57 (L) >60 mL/min   Anion gap 11 5 - 15  CBC with Differential     Status: Abnormal   Collection Time: 08/01/20  3:28 PM  Result Value Ref Range   WBC 27.5 (H) 4.0 - 10.5 K/uL   RBC 3.19 (L) 4.22 - 5.81 MIL/uL   Hemoglobin 8.7 (L) 13.0 - 17.0 g/dL   HCT 28.1 (L) 39.0 - 52.0 %   MCV 88.1 80.0 - 100.0 fL   MCH 27.3 26.0 - 34.0 pg   MCHC 31.0 30.0 - 36.0 g/dL   RDW 17.0 (H) 11.5 - 15.5 %   Platelets 449 (H) 150 - 400 K/uL   nRBC 0.0 0.0 - 0.2 %   Neutrophils Relative % 93 %   Neutro Abs 25.6 (H) 1.7 - 7.7 K/uL   Lymphocytes Relative 3 %   Lymphs Abs 0.8 0.7 - 4.0 K/uL   Monocytes Relative 4 %   Monocytes Absolute 1.1 (H) 0.1 - 1.0 K/uL   Eosinophils  Relative 0 %   Eosinophils Absolute 0.0 0.0 - 0.5 K/uL   Basophils Relative 0 %   Basophils Absolute 0.0 0.0 - 0.1 K/uL   nRBC 0 0 /100 WBC   Abs Immature Granulocytes 0.00 0.00 - 0.07 K/uL  Lactic acid, plasma      Status: Abnormal   Collection Time: 08/01/20  3:30 PM  Result Value Ref Range   Lactic Acid, Venous 2.3 (HH) 0.5 - 1.9 mmol/L  Lactic acid, plasma     Status: None   Collection Time: 08/01/20  8:33 PM  Result Value Ref Range   Lactic Acid, Venous 1.2 0.5 - 1.9 mmol/L  POC occult blood, ED Provider will collect     Status: None   Collection Time: 08/01/20 11:25 PM  Result Value Ref Range   Fecal Occult Bld NEGATIVE NEGATIVE   No results found for this or any previous visit (from the past 240 hour(s)).  Renal Function: Recent Labs    08/01/20 1528  CREATININE 1.51*   CrCl cannot be calculated (Unknown ideal weight.).  Radiologic Imaging: CT Abdomen Pelvis W Contrast  Result Date: 08/01/2020 CLINICAL DATA:  Groin wound infection. EXAM: CT ABDOMEN AND PELVIS WITH CONTRAST TECHNIQUE: Multidetector CT imaging of the abdomen and pelvis was performed using the standard protocol following bolus administration of intravenous contrast. CONTRAST:  100mL OMNIPAQUE IOHEXOL 300 MG/ML  SOLN COMPARISON:  April 23, 2020 FINDINGS: Lower chest: No acute abnormality. Hepatobiliary: No focal liver abnormality is seen. No gallstones, gallbladder wall thickening, or biliary dilatation. Pancreas: Numerous subcentimeter parenchymal calcifications are seen scattered throughout the pancreas. Spleen: Normal in size without focal abnormality. Adrenals/Urinary Tract: Adrenal glands are unremarkable. Kidneys are normal, without renal calculi, focal lesion, or hydronephrosis. A Foley catheter is seen within a nearly empty urinary bladder. A mild amount of air is seen within the bladder lumen with moderate severity diffuse bladder wall thickening. Stomach/Bowel: Stomach is within normal limits. Appendix appears normal. No evidence of bowel wall thickening, distention, or inflammatory changes. Vascular/Lymphatic: No significant vascular findings are present. No enlarged abdominal or pelvic lymph nodes. Reproductive:  Prostate is unremarkable. Other: There is a 3.5 cm x 2.2 cm fat containing right inguinal hernia. A 3.1 cm x 1.7 cm fat containing left inguinal hernia is also noted. A 1.9 cm thick, the elongated (approximately 8.0 cm) complex collection of fluid and air is seen adjacent to the dorsal aspect of the corpora cavernosa of the base of the penis. A thin, surrounding hyperdense rim is present with associated moderate severity adjacent inflammatory fat stranding. Musculoskeletal: A large soft tissue defect is seen along the lateral aspect of the left hip, with findings suggestive of prior irrigation and debridement since the prior study. Predominantly stable heterogeneous sclerotic areas are seen involving predominantly the L1, L2, L3 and L4 vertebral bodies. IMPRESSION: 1. Findings consistent with an abscess adjacent to the dorsal aspect of the corpora cavernosa of the base of the penis, as described above. 2. Bilateral fat containing inguinal hernias. 3. Findings consistent with chronic pancreatitis. 4. Suspected interval wound irrigation and debridement along the lateral aspect of the left hip since the prior study. Electronically Signed   By: Thaddeus  Houston M.D.   On: 08/01/2020 22:23   DG Chest Port 1 View  Result Date: 08/01/2020 CLINICAL DATA:  Sepsis. EXAM: PORTABLE CHEST 1 VIEW COMPARISON:  04/24/2020 FINDINGS: The heart size remains enlarged. There is no focal infiltrate. No large pleural effusion. No pneumothorax. There is no acute osseous   abnormality. IMPRESSION: No active disease. Electronically Signed   By: Constance Holster M.D.   On: 08/01/2020 21:02    I independently reviewed the above imaging studies.  Impression/Recommendation 49yo M with HIV, DM, PAD and exam concerning for Fourniers gangrene. CT imaging with 8cm fluid collection and air at base of penis running along shaft. Leukocytosis to 27. Hypotensive and tachycardic in the ED requiring resuscitation with fluids and empiric IV  antibiotics.    - urgent exploration and debridement of penis and groin in the OR is recommended at this time - patient verbally consented in ED by urology team - covid status pending - please keep patient NPO (last ate at 7am) - ICU status may be necessary following OR, recommend communication with ICU - agree with broad spectrum empiric IV abx - please send UCx  - appreciate continued support and resuscitation while in ED  [facility] 08/01/2020, 11:30 PM

## 2020-08-01 NOTE — Consult Note (Addendum)
Urology Consult   Physician requesting consult: Dene Gentry, MD  Reason for consult: Fournier's Gangrene  History of Present Illness: Francisco Hutchinson is a 49 y.o. 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   He reports that the pain in his groin and penis started earlier today when nursing staff at his facility noted discharge from his penis. He denies associated fevers, chills, nausea, vomiting, difficulty with catheter. He has had an indwelling foley catheter in since 04/2020 when he was hospitalized for left hip infection, and it was last changed a few weeks ago per his report. He was on a longterm course of IV antibiotics for his hip infection which he has since completed and he has been seeing wound care with improvement of his decubitus ulcers/wounds. He reported he has seen a urologist in 06/2020 due to gross hematuria and concern for possible colovesical fistula which was not confirmed with any work up.  He has a history of fourniers with perirectal and perineal fasciitis that involved the periurethral area around the bulb in 2018 requiring several debridements with urology. He is not currently on anticoagulation for his afib and only takes ASA. He reports his blood glucose has been well controlled recently.   I reviewed the history and physical with the patient.  He has a penile abscess draining at the left base.  There was some induration tracking a few centimeters suprapubically.  The scrotum and perineum were normal.  I reviewed complete risks pros and cons and the need for surgical drainage.  He had been already consented.  I agree with all the findings noted  Past Medical History:  Diagnosis Date   Allergy    Anxiety and depression 05/29/2017   Diabetes mellitus without complication  ([place])    HIV (human immunodeficiency virus infection) ([place])    Hypertension    Stroke [facility])     Past Surgical History:  Procedure Laterality Date   ABDOMINAL AORTOGRAM W/LOWER EXTREMITY N/A 01/20/2018   Procedure: ABDOMINAL AORTOGRAM W/LOWER EXTREMITY;  Surgeon: Waynetta Sandy, MD;  Location: [facility];  Service: Cardiovascular;  Laterality: N/A;   abscess removed from back and left armpit     AMPUTATION Right 01/22/2018   Procedure: RIGHT BELOW KNEE AMPUTATION;  Surgeon: [name], MD;  Location: [place];  Service: Orthopedics;  Laterality: Right;   IRRIGATION AND DEBRIDEMENT BUTTOCKS Left 03/23/2017   Procedure: IRRIGATION AND DEBRIDEMENT LEFT BUTTOCK ABSCESS;  Surgeon: Excell Seltzer, MD;  Location: [facility];  Service: General;  Laterality: Left;   IRRIGATION AND DEBRIDEMENT BUTTOCKS N/A 03/25/2017   Procedure: IRRIGATION AND DEBRIDEMENT DRESSING CHANGE AND EXPLORATION OF PERINEAL WOUND; CYSTOSCOPY;  Surgeon: Excell Seltzer, MD;  Location: [facility];  Service: General;  Laterality: N/A;   PARS PLANA VITRECTOMY 27 GAUGE WITH 25 GAUGE PORT Left 08/31/2017   Surgery Right Arm      Current Hospital Medications:  Home Meds:  No current facility-administered medications on file prior to encounter.   Current Outpatient Medications on File Prior to Encounter  Medication Sig Dispense Refill   acetaminophen (TYLENOL) 325 MG tablet Take 2 tablets (650 mg total) by mouth every 6 (six) hours as needed for mild pain, fever or headache (or Fever >/= 101).     amLODipine (NORVASC) 5 MG tablet TAKE 1 TABLET  BY MOUTH EVERY DAY (Patient taking differently: Take 5 mg by mouth daily.) 90 tablet 3   aspirin EC 81 MG tablet Take 81 mg by mouth at bedtime.     atorvastatin (LIPITOR) 40 MG tablet Take 1 tablet (40 mg total) by mouth daily. 90 tablet 3   cyclobenzaprine (FLEXERIL) 10 MG tablet Take 1 tablet (10 mg total) by mouth 3 (three) times daily as needed for muscle spasms. 30  tablet 0   DESCOVY 200-25 MG tablet Take 1 tablet by mouth at bedtime. 90 tablet 3   Dulaglutide (TRULICITY) 1.5 TI/1.4ER SOPN INJECT 1 PEN SUBCUTANEOUSLY ONCE A WEEK ON SUNDAY (Patient taking differently: Inject 1.5 mg into the skin once a week. Take on Sundays) 2 pen 3   ferrous gluconate (FERGON) 324 MG tablet Take 1 tablet (324 mg total) by mouth daily with breakfast. 30 tablet 0   hydrochlorothiazide (HYDRODIURIL) 25 MG tablet TAKE 1 TABLET BY MOUTH EVERY DAY (Patient taking differently: Take 25 mg by mouth daily.) 90 tablet 2   Incontinence Supplies KIT Use as needed for incontinence 1 each 0   JARDIANCE 10 MG TABS tablet TAKE 1 TABLET BY MOUTH EVERY DAY (Patient taking differently: Take 10 mg by mouth daily.) 90 tablet 0   LANTUS SOLOSTAR 100 UNIT/ML Solostar Pen Inject 55 Units into the skin at bedtime. 45 mL 3   metoprolol tartrate (LOPRESSOR) 100 MG tablet Take 1 tablet (100 mg total) by mouth 2 (two) times daily. 180 tablet 1   sertraline (ZOLOFT) 100 MG tablet TAKE 1 TABLET BY MOUTH TWICE A DAY (Patient taking differently: Take 100 mg by mouth daily.) 180 tablet 3   TIVICAY 50 MG tablet Take 1 tablet (50 mg total) by mouth daily. 90 tablet 3   acetic acid 2 % otic solution Place 6 drops into the right ear every 3 (three) hours. (Patient not taking: No sig reported) 15 mL 0   amoxicillin (AMOXIL) 875 MG tablet Take 1 tablet (875 mg total) by mouth 2 (two) times daily. (Patient not taking: No sig reported) 14 tablet 0   collagenase (SANTYL) ointment Apply 1 application topically daily. (Patient not taking: No sig reported) 30 g 1   fenofibrate (TRICOR) 48 MG tablet Take 1 tablet by mouth once daily (Patient not taking: Reported on 08/01/2020) 90 tablet 0   fenofibrate micronized (ANTARA) 43 MG capsule TAKE 1 CAPSULE BY MOUTH ONCE DAILY BEFORE BREAKFAST (Patient not taking: No sig reported) 90 capsule 1     Scheduled Meds:  Continuous Infusions:  [START ON 08/02/2020] ceFEPime  (MAXIPIME) IV     lactated ringers 150 mL/hr at 08/01/20 2248   vancomycin 1,000 mg (08/01/20 2238)   PRN Meds:.  Allergies: No Known Allergies  Family History  Problem Relation Age of Onset   Arthritis Mother    Hypertension Mother    Alcohol abuse Father    Cancer Father    Diabetes Father    Early death Father    Hyperlipidemia Father    Hypertension Father    Alcohol abuse Sister    Heart attack Maternal Grandmother    Heart disease Maternal Grandmother    Hypertension Maternal Grandmother    Cancer Maternal Grandfather        lung   Cancer Paternal Grandmother    Diabetes Paternal Grandmother     Social History:  reports that he has never smoked. He has never used smokeless tobacco. He reports current alcohol use of about 2.0 standard  drinks of alcohol per week. He reports that he does not use drugs.  ROS: A complete review of systems was performed.  All systems are negative except for pertinent findings as noted.  Physical Exam:  Vital signs in last 24 hours: Temp:  [98.7 F (37.1 C)-99.3 F (37.4 C)] 98.7 F (37.1 C) (12/22 2008) Pulse Rate:  [28-135] 126 (12/22 2245) Resp:  [9-20] 9 (12/22 2245) BP: (88-103)/(60-75) 94/71 (12/22 2245) SpO2:  [95 %-100 %] 100 % (12/22 2245) Constitutional:  Alert and oriented, No acute distress Cardiovascular: Regular rate and rhythm, No JVD Respiratory: Normal respiratory effort, Lungs clear bilaterally GI: Abdomen is soft, nontender, nondistended, no abdominal masses GU: Catheter in place draining clear yellow urine with erosion of ventral meatus/distal shaft of penis. Partially buried penis with crepitus of suprapubic fat pad and penile shaft. Fluctuance not discernable due to overall swelling and anatomy. Several open ulcers with purulent discharge at base and along shaft of penis. Overall edematous and indurated, less tender than expected. Scrotum relatively non tender until base of penis is reached.  Lymphatic: No  lymphadenopathy Neurologic: Grossly intact, no focal deficits Psychiatric: Normal mood and affect  Laboratory Data:  Recent Labs    08/01/20 1528  WBC 27.5*  HGB 8.7*  HCT 28.1*  PLT 449*    Recent Labs    08/01/20 1528  NA 131*  K 3.1*  CL 96*  GLUCOSE 143*  BUN 30*  CALCIUM 7.7*  CREATININE 1.51*     Results for orders placed or performed during the hospital encounter of 08/01/20 (from the past 24 hour(s))  Comprehensive metabolic panel     Status: Abnormal   Collection Time: 08/01/20  3:28 PM  Result Value Ref Range   Sodium 131 (L) 135 - 145 mmol/L   Potassium 3.1 (L) 3.5 - 5.1 mmol/L   Chloride 96 (L) 98 - 111 mmol/L   CO2 24 22 - 32 mmol/L   Glucose, Bld 143 (H) 70 - 99 mg/dL   BUN 30 (H) 6 - 20 mg/dL   Creatinine, Ser 1.51 (H) 0.61 - 1.24 mg/dL   Calcium 7.7 (L) 8.9 - 10.3 mg/dL   Total Protein 6.2 (L) 6.5 - 8.1 g/dL   Albumin 1.7 (L) 3.5 - 5.0 g/dL   AST 17 15 - 41 U/L   ALT 12 0 - 44 U/L   Alkaline Phosphatase 103 38 - 126 U/L   Total Bilirubin 0.8 0.3 - 1.2 mg/dL   GFR, Estimated 57 (L) >60 mL/min   Anion gap 11 5 - 15  CBC with Differential     Status: Abnormal   Collection Time: 08/01/20  3:28 PM  Result Value Ref Range   WBC 27.5 (H) 4.0 - 10.5 K/uL   RBC 3.19 (L) 4.22 - 5.81 MIL/uL   Hemoglobin 8.7 (L) 13.0 - 17.0 g/dL   HCT 28.1 (L) 39.0 - 52.0 %   MCV 88.1 80.0 - 100.0 fL   MCH 27.3 26.0 - 34.0 pg   MCHC 31.0 30.0 - 36.0 g/dL   RDW 17.0 (H) 11.5 - 15.5 %   Platelets 449 (H) 150 - 400 K/uL   nRBC 0.0 0.0 - 0.2 %   Neutrophils Relative % 93 %   Neutro Abs 25.6 (H) 1.7 - 7.7 K/uL   Lymphocytes Relative 3 %   Lymphs Abs 0.8 0.7 - 4.0 K/uL   Monocytes Relative 4 %   Monocytes Absolute 1.1 (H) 0.1 - 1.0 K/uL   Eosinophils  Relative 0 %   Eosinophils Absolute 0.0 0.0 - 0.5 K/uL   Basophils Relative 0 %   Basophils Absolute 0.0 0.0 - 0.1 K/uL   nRBC 0 0 /100 WBC   Abs Immature Granulocytes 0.00 0.00 - 0.07 K/uL  Lactic acid, plasma      Status: Abnormal   Collection Time: 08/01/20  3:30 PM  Result Value Ref Range   Lactic Acid, Venous 2.3 (HH) 0.5 - 1.9 mmol/L  Lactic acid, plasma     Status: None   Collection Time: 08/01/20  8:33 PM  Result Value Ref Range   Lactic Acid, Venous 1.2 0.5 - 1.9 mmol/L  POC occult blood, ED Provider will collect     Status: None   Collection Time: 08/01/20 11:25 PM  Result Value Ref Range   Fecal Occult Bld NEGATIVE NEGATIVE   No results found for this or any previous visit (from the past 240 hour(s)).  Renal Function: Recent Labs    08/01/20 1528  CREATININE 1.51*   CrCl cannot be calculated (Unknown ideal weight.).  Radiologic Imaging: CT Abdomen Pelvis W Contrast  Result Date: 08/01/2020 CLINICAL DATA:  Groin wound infection. EXAM: CT ABDOMEN AND PELVIS WITH CONTRAST TECHNIQUE: Multidetector CT imaging of the abdomen and pelvis was performed using the standard protocol following bolus administration of intravenous contrast. CONTRAST:  143m OMNIPAQUE IOHEXOL 300 MG/ML  SOLN COMPARISON:  April 23, 2020 FINDINGS: Lower chest: No acute abnormality. Hepatobiliary: No focal liver abnormality is seen. No gallstones, gallbladder wall thickening, or biliary dilatation. Pancreas: Numerous subcentimeter parenchymal calcifications are seen scattered throughout the pancreas. Spleen: Normal in size without focal abnormality. Adrenals/Urinary Tract: Adrenal glands are unremarkable. Kidneys are normal, without renal calculi, focal lesion, or hydronephrosis. A Foley catheter is seen within a nearly empty urinary bladder. A mild amount of air is seen within the bladder lumen with moderate severity diffuse bladder wall thickening. Stomach/Bowel: Stomach is within normal limits. Appendix appears normal. No evidence of bowel wall thickening, distention, or inflammatory changes. Vascular/Lymphatic: No significant vascular findings are present. No enlarged abdominal or pelvic lymph nodes. Reproductive:  Prostate is unremarkable. Other: There is a 3.5 cm x 2.2 cm fat containing right inguinal hernia. A 3.1 cm x 1.7 cm fat containing left inguinal hernia is also noted. A 1.9 cm thick, the elongated (approximately 8.0 cm) complex collection of fluid and air is seen adjacent to the dorsal aspect of the corpora cavernosa of the base of the penis. A thin, surrounding hyperdense rim is present with associated moderate severity adjacent inflammatory fat stranding. Musculoskeletal: A large soft tissue defect is seen along the lateral aspect of the left hip, with findings suggestive of prior irrigation and debridement since the prior study. Predominantly stable heterogeneous sclerotic areas are seen involving predominantly the L1, L2, L3 and L4 vertebral bodies. IMPRESSION: 1. Findings consistent with an abscess adjacent to the dorsal aspect of the corpora cavernosa of the base of the penis, as described above. 2. Bilateral fat containing inguinal hernias. 3. Findings consistent with chronic pancreatitis. 4. Suspected interval wound irrigation and debridement along the lateral aspect of the left hip since the prior study. Electronically Signed   By: TVirgina NorfolkM.D.   On: 08/01/2020 22:23   DG Chest Port 1 View  Result Date: 08/01/2020 CLINICAL DATA:  Sepsis. EXAM: PORTABLE CHEST 1 VIEW COMPARISON:  04/24/2020 FINDINGS: The heart size remains enlarged. There is no focal infiltrate. No large pleural effusion. No pneumothorax. There is no acute osseous  abnormality. IMPRESSION: No active disease. Electronically Signed   By: Constance Holster M.D.   On: 08/01/2020 21:02    I independently reviewed the above imaging studies.  Impression/Recommendation 49yo M with HIV, DM, PAD and exam concerning for Fourniers gangrene. CT imaging with 8cm fluid collection and air at base of penis running along shaft. Leukocytosis to 27. Hypotensive and tachycardic in the ED requiring resuscitation with fluids and empiric IV  antibiotics.    - urgent exploration and debridement of penis and groin in the OR is recommended at this time - patient verbally consented in ED by urology team - covid status pending - please keep patient NPO (last ate at 7am) - ICU status may be necessary following OR, recommend communication with ICU - agree with broad spectrum empiric IV abx - please send UCx  - appreciate continued support and resuscitation while in ED  [facility] 08/01/2020, 11:30 PM

## 2020-08-01 NOTE — Anesthesia Preprocedure Evaluation (Addendum)
Anesthesia Evaluation  Patient identified by MRN, date of birth, ID band Patient awake    Reviewed: Allergy & Precautions, H&P , NPO status , Patient's Chart, lab work & pertinent test results, reviewed documented beta blocker date and time   Airway Mallampati: II  TM Distance: >3 FB Neck ROM: Full    Dental no notable dental hx. (+) Teeth Intact, Dental Advisory Given   Pulmonary neg pulmonary ROS,    Pulmonary exam normal breath sounds clear to auscultation       Cardiovascular hypertension, Pt. on medications and Pt. on home beta blockers  Rhythm:Regular Rate:Normal     Neuro/Psych Anxiety Depression CVA, Residual Symptoms    GI/Hepatic negative GI ROS, Neg liver ROS,   Endo/Other  diabetes, Poorly Controlled, Insulin Dependent  Renal/GU Renal InsufficiencyRenal disease  negative genitourinary   Musculoskeletal   Abdominal   Peds  Hematology  (+) HIV,   Anesthesia Other Findings   Reproductive/Obstetrics negative OB ROS                            Anesthesia Physical Anesthesia Plan  ASA: III and emergent  Anesthesia Plan: General   Post-op Pain Management:    Induction: Intravenous  PONV Risk Score and Plan: 3 and Ondansetron, Midazolam and Treatment may vary due to age or medical condition  Airway Management Planned: Oral ETT  Additional Equipment:   Intra-op Plan:   Post-operative Plan: Extubation in OR  Informed Consent: I have reviewed the patients History and Physical, chart, labs and discussed the procedure including the risks, benefits and alternatives for the proposed anesthesia with the patient or authorized representative who has indicated his/her understanding and acceptance.     Dental advisory given  Plan Discussed with: CRNA  Anesthesia Plan Comments:         Anesthesia Quick Evaluation

## 2020-08-01 NOTE — ED Notes (Signed)
Called pt x3 for vitals, no response. 

## 2020-08-01 NOTE — ED Provider Notes (Signed)
[place] EMERGENCY DEPARTMENT Provider Note   CSN: [id number] Arrival date & time: 08/01/20  1438     History Chief Complaint  Patient presents with  . Wound Infection    Francisco Hutchinson is a 49 y.o. male.  HPI    Patient with significant medical history of anxiety, depression, diabetes, HIV, hypertension, stroke, A. fib currently not anticoagulated presents to the emergency department with chief complaint of penile abscess.  Patient states he had necrotizing fasciitis of his left hip and was hospitalized from 09/14- 09/16.  Patient went to the OR where he had debridement due to severe infection.  He was placed on IV Vanco and Zosyn.  He has been following up at his wound care doctor, Dr. Welton Flakes who has been managing his wounds.  He was last seen by him on 12/14, wound has been improving currently not on antibiotics.  Patient has chronic Foley catheter due to immobility placed (3 months ago) patient saw his urologist on 11/19 Dr. Amalia Hailey due to concerns of possible enterovaginal fistula.  Urology did not find any evidence of this and recommend she follows up if symptoms worsen.  Patient states his nursing staff at [facility] changed his Foley catheter and saw a worsening abscess on his penis.  They then sent him here to be evaluated.  He states he has some tenderness along his penile shaft and noticed some discharge but states he has no fevers, chills, chest pain, abdominal pain, nausea, vomiting, diarrhea.  He denies any alleviating factors at this time.  Past Medical History:  Diagnosis Date  . Allergy   . Anxiety and depression 05/29/2017  . Diabetes mellitus without complication ([place])   . HIV (human immunodeficiency virus infection) ([place])   . Hypertension   . Stroke [facility])     Patient Active Problem List   Diagnosis Date Noted  . Malignant otitis externa of right ear 03/14/2020  . Onychomycosis 06/15/2018  . Debility 04/16/2018  . Sacral ulcer  ([place]) 04/16/2018  . Hematuria 03/11/2018  . Diabetic ulcer of left foot associated with type 2 diabetes mellitus ([place]) 03/11/2018  . S/P unilateral BKA (below knee amputation), right ([place]) 02/05/2018  . Depression, major, single episode, in partial remission ([place]) 07/30/2017  . Hyperlipidemia associated with type 2 diabetes mellitus ([place]) 06/24/2017  . Hypertension associated with diabetes ([place]) 06/24/2017  . Hidradenitis 05/29/2017  . Candidal intertrigo 05/29/2017  . Stroke ([place])   . HIV disease ([place])   . CKD (chronic kidney disease), stage III ([place]) 03/25/2017  . Type 2 diabetes mellitus with diabetic chronic kidney disease ([place]) 03/25/2017    Past Surgical History:  Procedure Laterality Date  . ABDOMINAL AORTOGRAM W/LOWER EXTREMITY N/A 01/20/2018   Procedure: ABDOMINAL AORTOGRAM W/LOWER EXTREMITY;  Surgeon: Waynetta Sandy, MD;  Location: [facility];  Service: Cardiovascular;  Laterality: N/A;  . abscess removed from back and left armpit    . AMPUTATION Right 01/22/2018   Procedure: RIGHT BELOW KNEE AMPUTATION;  Surgeon: [name], MD;  Location: [facility];  Service: Orthopedics;  Laterality: Right;  . IRRIGATION AND DEBRIDEMENT BUTTOCKS Left 03/23/2017   Procedure: IRRIGATION AND DEBRIDEMENT LEFT BUTTOCK ABSCESS;  Surgeon: Excell Seltzer, MD;  Location: [facility];  Service: General;  Laterality: Left;  . IRRIGATION AND DEBRIDEMENT BUTTOCKS N/A 03/25/2017   Procedure: IRRIGATION AND DEBRIDEMENT DRESSING CHANGE AND EXPLORATION OF PERINEAL WOUND; CYSTOSCOPY;  Surgeon: Excell Seltzer, MD;  Location: [facility];  Service: General;  Laterality:  N/A;  . PARS PLANA VITRECTOMY 27 GAUGE WITH 25 GAUGE PORT Left 08/31/2017  . Surgery Right Arm         Family History  Problem Relation Age of Onset  . Arthritis Mother   . Hypertension Mother   . Alcohol abuse Father   . Cancer Father   . Diabetes Father   . Early death Father   . Hyperlipidemia Father   . Hypertension  Father   . Alcohol abuse Sister   . Heart attack Maternal Grandmother   . Heart disease Maternal Grandmother   . Hypertension Maternal Grandmother   . Cancer Maternal Grandfather        lung  . Cancer Paternal Grandmother   . Diabetes Paternal Grandmother     Social History   Tobacco Use  . Smoking status: Never Smoker  . Smokeless tobacco: Never Used  Vaping Use  . Vaping Use: Never used  Substance Use Topics  . Alcohol use: Yes    Alcohol/week: 2.0 standard drinks    Types: 1 Glasses of wine, 1 Cans of beer per week    Comment: rare  . Drug use: No    Home Medications Prior to Admission medications   Medication Sig Start Date End Date Taking? Authorizing Provider  acetaminophen (TYLENOL) 325 MG tablet Take 2 tablets (650 mg total) by mouth every 6 (six) hours as needed for mild pain, fever or headache (or Fever >/= 101). 12/29/17  Yes [name], MD  amLODipine (NORVASC) 5 MG tablet TAKE 1 TABLET BY MOUTH EVERY DAY Patient taking differently: Take 5 mg by mouth daily. 07/09/20  Yes [name], MD  aspirin EC 81 MG tablet Take 81 mg by mouth at bedtime.   Yes [provider]  atorvastatin (LIPITOR) 40 MG tablet Take 1 tablet (40 mg total) by mouth daily. 08/29/19  Yes [name], MD  cyclobenzaprine (FLEXERIL) 10 MG tablet Take 1 tablet (10 mg total) by mouth 3 (three) times daily as needed for muscle spasms. 07/30/18  Yes [name], MD  DESCOVY 200-25 MG tablet Take 1 tablet by mouth at bedtime. 11/30/19  Yes Michel Bickers, MD  Dulaglutide (TRULICITY) 1.5 ZO/1.0RU SOPN INJECT 1 PEN SUBCUTANEOUSLY ONCE A WEEK ON SUNDAY Patient taking differently: Inject 1.5 mg into the skin once a week. Take on Sundays 12/14/19  Yes [name], MD  ferrous gluconate (FERGON) 324 MG tablet Take 1 tablet (324 mg total) by mouth daily with breakfast. 01/26/18  Yes Dhungel, Nishant, MD  hydrochlorothiazide (HYDRODIURIL) 25 MG tablet TAKE 1 TABLET BY MOUTH EVERY  DAY Patient taking differently: Take 25 mg by mouth daily. 04/11/20  Yes [name], MD  Incontinence Supplies KIT Use as needed for incontinence 04/16/18  Yes [name], MD  JARDIANCE 10 MG TABS tablet TAKE 1 TABLET BY MOUTH EVERY DAY Patient taking differently: Take 10 mg by mouth daily. 07/09/20  Yes [name], MD  LANTUS SOLOSTAR 100 UNIT/ML Solostar Pen Inject 55 Units into the skin at bedtime. 07/27/19  Yes [name], MD  metoprolol tartrate (LOPRESSOR) 100 MG tablet Take 1 tablet (100 mg total) by mouth 2 (two) times daily. 12/05/19  Yes [name], MD  sertraline (ZOLOFT) 100 MG tablet TAKE 1 TABLET BY MOUTH TWICE A DAY Patient taking differently: Take 100 mg by mouth daily. 06/24/20  Yes [name], MD  TIVICAY 50 MG tablet Take 1 tablet (50 mg total) by  mouth daily. 11/30/19  Yes Michel Bickers, MD  acetic acid 2 % otic solution Place 6 drops into the right ear every 3 (three) hours. Patient not taking: No sig reported 12/01/19   [name], MD  amoxicillin (AMOXIL) 875 MG tablet Take 1 tablet (875 mg total) by mouth 2 (two) times daily. Patient not taking: No sig reported 11/08/19   [name], MD  collagenase (SANTYL) ointment Apply 1 application topically daily. Patient not taking: No sig reported 05/12/18   [name], MD  fenofibrate (TRICOR) 48 MG tablet Take 1 tablet by mouth once daily Patient not taking: Reported on 08/01/2020 04/11/20   [name], MD  fenofibrate micronized (ANTARA) 43 MG capsule TAKE 1 CAPSULE BY MOUTH ONCE DAILY BEFORE BREAKFAST Patient not taking: No sig reported 04/11/20   [name], MD    Allergies    Patient has no known allergies.  Review of Systems   Review of Systems  Constitutional: Negative for chills and fever.  HENT: Negative for congestion.   Respiratory: Negative for shortness of breath.   Cardiovascular: Negative for chest pain.  Gastrointestinal: Negative for abdominal pain, diarrhea,  nausea and vomiting.  Genitourinary: Positive for penile discharge, penile pain, penile swelling and scrotal swelling. Negative for dysuria, enuresis, testicular pain and urgency.  Musculoskeletal: Negative for back pain.  Skin: Negative for rash.  Neurological: Negative for dizziness.  Hematological: Does not bruise/bleed easily.    Physical Exam Updated Vital Signs BP 94/71   Pulse (!) 126   Temp 98.7 F (37.1 C) (Oral)   Resp (!) 9   SpO2 100%   Physical Exam Vitals and nursing note reviewed. Exam conducted with a chaperone present.  Constitutional:      General: He is not in acute distress.    Appearance: He is ill-appearing.     Comments: Patient is a chronically ill-appearing patient, right leg amputee above-the-knee, with Foley catheter in place.  HENT:     Head: Normocephalic and atraumatic.     Nose: No congestion.     Mouth/Throat:     Mouth: Mucous membranes are moist.     Pharynx: Oropharynx is clear. No oropharyngeal exudate or posterior oropharyngeal erythema.  Eyes:     Conjunctiva/sclera: Conjunctivae normal.  Cardiovascular:     Rate and Rhythm: Regular rhythm. Tachycardia present.     Pulses: Normal pulses.     Heart sounds: No murmur heard. No friction rub. No gallop.   Pulmonary:     Effort: No respiratory distress.     Breath sounds: No wheezing, rhonchi or rales.  Abdominal:     Palpations: Abdomen is soft.     Tenderness: There is no abdominal tenderness.  Genitourinary:    Comments: With a chaperone present patient genitals were examined he has a abscess noted in the distal end of his penile shaft.  It was erythematous, draining purulent discharge.  He had noted erythema around his pannus, fluctuance induration present.  Patient has a Foley catheter in place and appears to be draining purulent discharge.  Testicles were nontender to palpation Skin:    General: Skin is warm and dry.     Comments: Patient has a large wound noted on his left hip, it  measured approximately 4 cm in diameter, 4 cm deep, no surrounding erythematous noted no drainage or discharge present.  Patient's bottom was erythematous with fecal matter all over.  No noted abscess, drainage or discharge present.  Neurological:  Mental Status: He is alert.  Psychiatric:        Mood and Affect: Mood normal.       ED Results / Procedures / Treatments   Labs (all labs ordered are listed, but only abnormal results are displayed) Labs Reviewed  LACTIC ACID, PLASMA - Abnormal; Notable for the following components:      Result Value   Lactic Acid, Venous 2.3 (*)    All other components within normal limits  COMPREHENSIVE METABOLIC PANEL - Abnormal; Notable for the following components:   Sodium 131 (*)    Potassium 3.1 (*)    Chloride 96 (*)    Glucose, Bld 143 (*)    BUN 30 (*)    Creatinine, Ser 1.51 (*)    Calcium 7.7 (*)    Total Protein 6.2 (*)    Albumin 1.7 (*)    GFR, Estimated 57 (*)    All other components within normal limits  CBC WITH DIFFERENTIAL/PLATELET - Abnormal; Notable for the following components:   WBC 27.5 (*)    RBC 3.19 (*)    Hemoglobin 8.7 (*)    HCT 28.1 (*)    RDW 17.0 (*)    Platelets 449 (*)    Neutro Abs 25.6 (*)    Monocytes Absolute 1.1 (*)    All other components within normal limits  RESP PANEL BY RT-PCR (FLU A&B, COVID) ARPGX2  CULTURE, BLOOD (ROUTINE X 2)  CULTURE, BLOOD (ROUTINE X 2)  URINE CULTURE  LACTIC ACID, PLASMA  URINALYSIS, ROUTINE W REFLEX MICROSCOPIC  LACTIC ACID, PLASMA  LACTIC ACID, PLASMA  PROTIME-INR  APTT  POC OCCULT BLOOD, ED    EKG EKG Interpretation  Date/Time:  Wednesday August 01 2020 22:28:02 EST Ventricular Rate:  129 PR Interval:    QRS Duration: 101 QT Interval:  324 QTC Calculation: 475 R Axis:   12 Text Interpretation: Atrial fibrillation Low voltage, extremity leads Nonspecific repol abnormality, diffuse leads Confirmed by Dene Gentry [phone]) on 08/01/2020 10:34:02  PM   Radiology CT Abdomen Pelvis W Contrast  Result Date: 08/01/2020 CLINICAL DATA:  Groin wound infection. EXAM: CT ABDOMEN AND PELVIS WITH CONTRAST TECHNIQUE: Multidetector CT imaging of the abdomen and pelvis was performed using the standard protocol following bolus administration of intravenous contrast. CONTRAST:  182m OMNIPAQUE IOHEXOL 300 MG/ML  SOLN COMPARISON:  April 23, 2020 FINDINGS: Lower chest: No acute abnormality. Hepatobiliary: No focal liver abnormality is seen. No gallstones, gallbladder wall thickening, or biliary dilatation. Pancreas: Numerous subcentimeter parenchymal calcifications are seen scattered throughout the pancreas. Spleen: Normal in size without focal abnormality. Adrenals/Urinary Tract: Adrenal glands are unremarkable. Kidneys are normal, without renal calculi, focal lesion, or hydronephrosis. A Foley catheter is seen within a nearly empty urinary bladder. A mild amount of air is seen within the bladder lumen with moderate severity diffuse bladder wall thickening. Stomach/Bowel: Stomach is within normal limits. Appendix appears normal. No evidence of bowel wall thickening, distention, or inflammatory changes. Vascular/Lymphatic: No significant vascular findings are present. No enlarged abdominal or pelvic lymph nodes. Reproductive: Prostate is unremarkable. Other: There is a 3.5 cm x 2.2 cm fat containing right inguinal hernia. A 3.1 cm x 1.7 cm fat containing left inguinal hernia is also noted. A 1.9 cm thick, the elongated (approximately 8.0 cm) complex collection of fluid and air is seen adjacent to the dorsal aspect of the corpora cavernosa of the base of the penis. A thin, surrounding hyperdense rim is present with associated moderate severity adjacent inflammatory  fat stranding. Musculoskeletal: A large soft tissue defect is seen along the lateral aspect of the left hip, with findings suggestive of prior irrigation and debridement since the prior study.  Predominantly stable heterogeneous sclerotic areas are seen involving predominantly the L1, L2, L3 and L4 vertebral bodies. IMPRESSION: 1. Findings consistent with an abscess adjacent to the dorsal aspect of the corpora cavernosa of the base of the penis, as described above. 2. Bilateral fat containing inguinal hernias. 3. Findings consistent with chronic pancreatitis. 4. Suspected interval wound irrigation and debridement along the lateral aspect of the left hip since the prior study. Electronically Signed   By: Virgina Norfolk M.D.   On: 08/01/2020 22:23   DG Chest Port 1 View  Result Date: 08/01/2020 CLINICAL DATA:  Sepsis. EXAM: PORTABLE CHEST 1 VIEW COMPARISON:  04/24/2020 FINDINGS: The heart size remains enlarged. There is no focal infiltrate. No large pleural effusion. No pneumothorax. There is no acute osseous abnormality. IMPRESSION: No active disease. Electronically Signed   By: Constance Holster M.D.   On: 08/01/2020 21:02    Procedures .Critical Care Performed by: Marcello Fennel, PA-C Authorized by: Marcello Fennel, PA-C   Critical care provider statement:    Critical care time (minutes):  45   Critical care time was exclusive of:  Separately billable procedures and treating other patients   Critical care was necessary to treat or prevent imminent or life-threatening deterioration of the following conditions:  Sepsis   Critical care was time spent personally by me on the following activities:  Discussions with consultants, evaluation of patient's response to treatment, examination of patient, ordering and performing treatments and interventions, ordering and review of laboratory studies, ordering and review of radiographic studies, pulse oximetry, re-evaluation of patient's condition and review of old charts   I assumed direction of critical care for this patient from another provider in my specialty: no     Care discussed with: admitting provider     (including critical  care time)  Medications Ordered in ED Medications  lactated ringers infusion ( Intravenous New Bag/Given 08/01/20 2248)  ceFEPIme (MAXIPIME) 2 g in sodium chloride 0.9 % 100 mL IVPB (has no administration in time range)  vancomycin (VANCOCIN) IVPB 1000 mg/200 mL premix (1,000 mg Intravenous New Bag/Given 08/01/20 2238)  lactated ringers bolus 1,000 mL (0 mLs Intravenous Stopped 08/01/20 2320)    And  lactated ringers bolus 1,000 mL (0 mLs Intravenous Stopped 08/01/20 2300)    And  lactated ringers bolus 1,000 mL (0 mLs Intravenous Stopped 08/01/20 2320)    And  lactated ringers bolus 500 mL (0 mLs Intravenous Stopped 08/01/20 2253)  ceFEPIme (MAXIPIME) 2 g in sodium chloride 0.9 % 100 mL IVPB (0 g Intravenous Stopped 08/01/20 2249)  iohexol (OMNIPAQUE) 300 MG/ML solution 100 mL (100 mLs Intravenous Contrast Given 08/01/20 2119)  clindamycin (CLEOCIN) IVPB 600 mg (0 mg Intravenous Stopped 08/01/20 2320)    ED Course  I have reviewed the triage vital signs and the nursing notes.  Pertinent labs & imaging results that were available during my care of the patient were reviewed by me and considered in my medical decision making (see chart for details).    MDM Rules/Calculators/A&P                          Patient presents with a penile abscess.  He was alert, appeared to be chronically ill, vital signs significant for tachycardia and a rectal temperature  of 103.  Due to likely source of infection i.e. scrotal abscess will call code sepsis start him on fluids and antibiotics and obtain imaging for further evaluation.  Due to large abscess noted on CT imaging will consult with neurology for further recommendations.  Spoke with Dr. Burr Medico who will come down and evaluate the patient.  Dr. Burr Medico recommends patient should be  taken to the OR for further management.  She recommends continue with current treatment regiment, keep him n.p.o. and anticipate he will need ICU care after  Patient was  reassessed after providing him with fluids and antibiotics, states he is not in much pain at this time.  Vital signs have remained stable with systolics pressure staying in the upper 90s and patient remains tachycardic in the 115.  Patient has no other complaints at this time.  CBC shows leukocytosis of 27.5, hemoglobin of 8.7 decreased from 10,  8 months ago, CMP shows hyponatremia 131, hypokalemia of 3.1, no metabolic acidosis, hyperglycemia of 143, elevated BUN, creatinine, please get baseline for patient, no anion gap present.  Initial lactate was 3.2 trended down to 1.2.  EKG shows A. fib signs of ischemia no ST elevation depression noted.  Chest x-ray did not reveal any acute abnormalities.  CT abdomen pelvis shows findings consistent with an abscess recently dorsal aspect of the corpora cavernosum at the base of the penis measuring approximately 8 cm in length.  He has bilateral fat-containing inguinal hernias, chronic pancreatitis, suspected interval wound irrigation and debridement along the lateral aspect the left hip.  Low Suspicion for cardiac abnormality causing  tachycardia and hypotension as patient denies chest pain, shortness of breath, no signs of hypoperfusion fluid overload on my exam.  EKG shows A. fib without signs of ischemia.  Suspect tachycardia and and hypotension is secondary due to infection as he  meets sepsis criteria.  Low suspicion for acute abdomen requiring immediate intervention as CT scan does not show any acute findings. low Suspicion for epididymitis or prostatitis as testicles were nontender to palpation, prostate was nontender to palpation.  Low suspicion for testicular torsion and there is no scrotal deformities on my exam, they are nontender to palpation.  I suspect patient's source of infection is secondary to his 8 cm abscess noted on CT scan.  Anticipate he will need surgical I&D and continued management inpatient.  Patient will be transferred to awaiting for  further evaluation     Final Clinical Impression(s) / ED Diagnoses Final diagnoses:  Penile abscess  Hypotension, unspecified hypotension type  Tachycardia    Rx / DC Orders ED Discharge Orders    None       Marcello Fennel, PA-C 08/01/20 2332    Valarie Merino, MD 08/01/20 [phone]

## 2020-08-01 NOTE — Progress Notes (Signed)
Pharmacy Antibiotic Note  Francisco Hutchinson is a 49 y.o. male admitted on 08/01/2020 with sepsis.  Pharmacy has been consulted for Cefepime dosing.  WBC elevated at 27.5. LA 2.3 > 1.2, SCr 1.51.   Plan: -Start Cefepime 2 gm IV Q 8 hours  -Monitor CBC, renal fx, cultures and clinical progress     Temp (24hrs), Avg:99 F (37.2 C), Min:98.7 F (37.1 C), Max:99.3 F (37.4 C)  Recent Labs  Lab 08/01/20 1528 08/01/20 1530  WBC 27.5*  --   CREATININE 1.51*  --   LATICACIDVEN  --  2.3*    CrCl cannot be calculated (Unknown ideal weight.).    No Known Allergies  Antimicrobials this admission: Cefepime 12/22 >>   Dose adjustments this admission:  Microbiology results: 12/22 BCx:  12/22 UCx:     Thank you for allowing pharmacy to be a part of this patient's care.  Albertina Parr, PharmD., BCPS, BCCCP Clinical Pharmacist Please refer to [facility] for unit-specific pharmacist

## 2020-08-01 NOTE — ED Triage Notes (Signed)
Pt transported from [facility] for possible wound infection to groin, wound already being treated on sacrum. Drainage per facility. IV est

## 2020-08-02 ENCOUNTER — Encounter (HOSPITAL_COMMUNITY): Payer: Self-pay | Admitting: Urology

## 2020-08-02 ENCOUNTER — Emergency Department (HOSPITAL_COMMUNITY): Payer: Medicare Other | Admitting: Registered Nurse

## 2020-08-02 DIAGNOSIS — B952 Enterococcus as the cause of diseases classified elsewhere: Secondary | ICD-10-CM | POA: Diagnosis not present

## 2020-08-02 DIAGNOSIS — L89224 Pressure ulcer of left hip, stage 4: Secondary | ICD-10-CM | POA: Diagnosis not present

## 2020-08-02 DIAGNOSIS — N493 Fournier gangrene: Secondary | ICD-10-CM

## 2020-08-02 DIAGNOSIS — E876 Hypokalemia: Secondary | ICD-10-CM | POA: Diagnosis present

## 2020-08-02 DIAGNOSIS — N4821 Abscess of corpus cavernosum and penis: Secondary | ICD-10-CM | POA: Diagnosis not present

## 2020-08-02 DIAGNOSIS — A419 Sepsis, unspecified organism: Secondary | ICD-10-CM

## 2020-08-02 DIAGNOSIS — N183 Chronic kidney disease, stage 3 unspecified: Secondary | ICD-10-CM | POA: Diagnosis not present

## 2020-08-02 DIAGNOSIS — L02215 Cutaneous abscess of perineum: Secondary | ICD-10-CM | POA: Diagnosis present

## 2020-08-02 DIAGNOSIS — E1122 Type 2 diabetes mellitus with diabetic chronic kidney disease: Secondary | ICD-10-CM | POA: Diagnosis not present

## 2020-08-02 DIAGNOSIS — N492 Inflammatory disorders of scrotum: Secondary | ICD-10-CM | POA: Diagnosis not present

## 2020-08-02 DIAGNOSIS — F32A Depression, unspecified: Secondary | ICD-10-CM | POA: Diagnosis present

## 2020-08-02 DIAGNOSIS — N179 Acute kidney failure, unspecified: Secondary | ICD-10-CM | POA: Diagnosis not present

## 2020-08-02 DIAGNOSIS — E1151 Type 2 diabetes mellitus with diabetic peripheral angiopathy without gangrene: Secondary | ICD-10-CM | POA: Diagnosis not present

## 2020-08-02 DIAGNOSIS — E119 Type 2 diabetes mellitus without complications: Secondary | ICD-10-CM | POA: Diagnosis not present

## 2020-08-02 DIAGNOSIS — I5022 Chronic systolic (congestive) heart failure: Secondary | ICD-10-CM | POA: Diagnosis present

## 2020-08-02 DIAGNOSIS — A4159 Other Gram-negative sepsis: Secondary | ICD-10-CM | POA: Diagnosis present

## 2020-08-02 DIAGNOSIS — M726 Necrotizing fasciitis: Secondary | ICD-10-CM | POA: Diagnosis not present

## 2020-08-02 DIAGNOSIS — I5020 Unspecified systolic (congestive) heart failure: Secondary | ICD-10-CM | POA: Diagnosis not present

## 2020-08-02 DIAGNOSIS — L02211 Cutaneous abscess of abdominal wall: Secondary | ICD-10-CM | POA: Diagnosis present

## 2020-08-02 DIAGNOSIS — E875 Hyperkalemia: Secondary | ICD-10-CM | POA: Diagnosis not present

## 2020-08-02 DIAGNOSIS — K4020 Bilateral inguinal hernia, without obstruction or gangrene, not specified as recurrent: Secondary | ICD-10-CM | POA: Diagnosis present

## 2020-08-02 DIAGNOSIS — Z89511 Acquired absence of right leg below knee: Secondary | ICD-10-CM | POA: Diagnosis not present

## 2020-08-02 DIAGNOSIS — F419 Anxiety disorder, unspecified: Secondary | ICD-10-CM | POA: Diagnosis present

## 2020-08-02 DIAGNOSIS — N1831 Chronic kidney disease, stage 3a: Secondary | ICD-10-CM | POA: Diagnosis present

## 2020-08-02 DIAGNOSIS — I4891 Unspecified atrial fibrillation: Secondary | ICD-10-CM | POA: Diagnosis not present

## 2020-08-02 DIAGNOSIS — K861 Other chronic pancreatitis: Secondary | ICD-10-CM | POA: Diagnosis present

## 2020-08-02 DIAGNOSIS — Z21 Asymptomatic human immunodeficiency virus [HIV] infection status: Secondary | ICD-10-CM | POA: Diagnosis not present

## 2020-08-02 DIAGNOSIS — E872 Acidosis: Secondary | ICD-10-CM | POA: Diagnosis not present

## 2020-08-02 DIAGNOSIS — I130 Hypertensive heart and chronic kidney disease with heart failure and stage 1 through stage 4 chronic kidney disease, or unspecified chronic kidney disease: Secondary | ICD-10-CM | POA: Diagnosis present

## 2020-08-02 DIAGNOSIS — E871 Hypo-osmolality and hyponatremia: Secondary | ICD-10-CM | POA: Diagnosis present

## 2020-08-02 DIAGNOSIS — L89626 Pressure-induced deep tissue damage of left heel: Secondary | ICD-10-CM | POA: Diagnosis present

## 2020-08-02 DIAGNOSIS — I420 Dilated cardiomyopathy: Secondary | ICD-10-CM | POA: Diagnosis not present

## 2020-08-02 DIAGNOSIS — T8143XA Infection following a procedure, organ and space surgical site, initial encounter: Secondary | ICD-10-CM | POA: Diagnosis not present

## 2020-08-02 DIAGNOSIS — I4819 Other persistent atrial fibrillation: Secondary | ICD-10-CM | POA: Diagnosis not present

## 2020-08-02 DIAGNOSIS — Z79899 Other long term (current) drug therapy: Secondary | ICD-10-CM | POA: Diagnosis not present

## 2020-08-02 DIAGNOSIS — R6520 Severe sepsis without septic shock: Secondary | ICD-10-CM

## 2020-08-02 DIAGNOSIS — D72829 Elevated white blood cell count, unspecified: Secondary | ICD-10-CM | POA: Diagnosis not present

## 2020-08-02 DIAGNOSIS — Z20822 Contact with and (suspected) exposure to covid-19: Secondary | ICD-10-CM | POA: Diagnosis present

## 2020-08-02 DIAGNOSIS — I129 Hypertensive chronic kidney disease with stage 1 through stage 4 chronic kidney disease, or unspecified chronic kidney disease: Secondary | ICD-10-CM | POA: Diagnosis not present

## 2020-08-02 DIAGNOSIS — I480 Paroxysmal atrial fibrillation: Secondary | ICD-10-CM | POA: Diagnosis not present

## 2020-08-02 DIAGNOSIS — Y838 Other surgical procedures as the cause of abnormal reaction of the patient, or of later complication, without mention of misadventure at the time of the procedure: Secondary | ICD-10-CM | POA: Diagnosis not present

## 2020-08-02 DIAGNOSIS — R6521 Severe sepsis with septic shock: Secondary | ICD-10-CM | POA: Diagnosis present

## 2020-08-02 DIAGNOSIS — B20 Human immunodeficiency virus [HIV] disease: Secondary | ICD-10-CM | POA: Diagnosis present

## 2020-08-02 DIAGNOSIS — I10 Essential (primary) hypertension: Secondary | ICD-10-CM | POA: Diagnosis not present

## 2020-08-02 DIAGNOSIS — D649 Anemia, unspecified: Secondary | ICD-10-CM | POA: Diagnosis not present

## 2020-08-02 DIAGNOSIS — I429 Cardiomyopathy, unspecified: Secondary | ICD-10-CM | POA: Diagnosis not present

## 2020-08-02 DIAGNOSIS — R000 Tachycardia, unspecified: Secondary | ICD-10-CM | POA: Diagnosis present

## 2020-08-02 DIAGNOSIS — I959 Hypotension, unspecified: Secondary | ICD-10-CM | POA: Diagnosis not present

## 2020-08-02 LAB — GLUCOSE, CAPILLARY
Glucose-Capillary: 62 mg/dL — ABNORMAL LOW (ref 70–99)
Glucose-Capillary: 66 mg/dL — ABNORMAL LOW (ref 70–99)
Glucose-Capillary: 101 mg/dL — ABNORMAL HIGH (ref 70–99)
Glucose-Capillary: 90 mg/dL (ref 70–99)
Glucose-Capillary: 68 mg/dL — ABNORMAL LOW (ref 70–99)
Glucose-Capillary: 75 mg/dL (ref 70–99)
Glucose-Capillary: 85 mg/dL (ref 70–99)
Glucose-Capillary: 52 mg/dL — ABNORMAL LOW (ref 70–99)
Glucose-Capillary: 113 mg/dL — ABNORMAL HIGH (ref 70–99)
Glucose-Capillary: 153 mg/dL — ABNORMAL HIGH (ref 70–99)

## 2020-08-02 LAB — COMPREHENSIVE METABOLIC PANEL
ALT: 11 U/L (ref 0–44)
AST: 17 U/L (ref 15–41)
Albumin: 1.7 g/dL — ABNORMAL LOW (ref 3.5–5.0)
Alkaline Phosphatase: 73 U/L (ref 38–126)
Anion gap: 9 (ref 5–15)
BUN: 28 mg/dL — ABNORMAL HIGH (ref 6–20)
CO2: 22 mmol/L (ref 22–32)
Calcium: 7.2 mg/dL — ABNORMAL LOW (ref 8.9–10.3)
Chloride: 101 mmol/L (ref 98–111)
Creatinine, Ser: 1.37 mg/dL — ABNORMAL HIGH (ref 0.61–1.24)
GFR, Estimated: >60 mL/min (ref >60)
Glucose, Bld: 95 mg/dL (ref 70–99)
Potassium: 3.1 mmol/L — ABNORMAL LOW (ref 3.5–5.1)
Sodium: 132 mmol/L — ABNORMAL LOW (ref 135–145)
Total Bilirubin: 0.7 mg/dL (ref 0.3–1.2)
Total Protein: 5.5 g/dL — ABNORMAL LOW (ref 6.5–8.1)

## 2020-08-02 LAB — APTT: aPTT: 45 seconds — ABNORMAL HIGH (ref 24–36)

## 2020-08-02 LAB — LACTIC ACID, PLASMA
Lactic Acid, Venous: 0.8 mmol/L (ref 0.5–1.9)
Lactic Acid, Venous: 0.9 mmol/L (ref 0.5–1.9)

## 2020-08-02 LAB — PROCALCITONIN: Procalcitonin: 0.4 ng/mL

## 2020-08-02 LAB — RESP PANEL BY RT-PCR (FLU A&B, COVID) ARPGX2
Influenza A by PCR: NEGATIVE
Influenza B by PCR: NEGATIVE
SARS Coronavirus 2 by RT PCR: NEGATIVE

## 2020-08-02 LAB — PROTIME-INR
INR: 1.5 — ABNORMAL HIGH (ref 0.8–1.2)
Prothrombin Time: 17.4 seconds — ABNORMAL HIGH (ref 11.4–15.2)

## 2020-08-02 LAB — MAGNESIUM: Magnesium: 1.8 mg/dL (ref 1.7–2.4)

## 2020-08-02 LAB — MRSA PCR SCREENING: MRSA by PCR: NEGATIVE

## 2020-08-02 LAB — PHOSPHORUS: Phosphorus: 2.4 mg/dL — ABNORMAL LOW (ref 2.5–4.6)

## 2020-08-02 MED ORDER — STERILE WATER FOR IRRIGATION IR SOLN
Status: DC | PRN
  Administered 2020-08-02: 1000 mL

## 2020-08-02 MED ORDER — LIDOCAINE 2% (20 MG/ML) 5 ML SYRINGE
INTRAMUSCULAR | Status: DC | PRN
  Administered 2020-08-02: 60 mg via INTRAVENOUS

## 2020-08-02 MED ORDER — LACTATED RINGERS IV SOLN: INTRAVENOUS | Status: DC | PRN

## 2020-08-02 MED ORDER — DEXTROSE 50 % IV SOLN
INTRAVENOUS | Status: AC
  Filled 2020-08-02: qty 50

## 2020-08-02 MED ORDER — METOPROLOL TARTRATE 50 MG PO TABS
ORAL_TABLET | ORAL | Status: AC
  Administered 2020-08-02: 17:00:00 100 mg via ORAL
  Filled 2020-08-02: qty 2

## 2020-08-02 MED ORDER — POTASSIUM PHOSPHATES 15 MMOLE/5ML IV SOLN
30.0000 mmol | Freq: Once | INTRAVENOUS | Status: AC
  Administered 2020-08-02: 11:00:00 30 mmol via INTRAVENOUS
  Filled 2020-08-02: qty 10

## 2020-08-02 MED ORDER — ONDANSETRON HCL 4 MG/2ML IJ SOLN
INTRAMUSCULAR | Status: DC | PRN
  Administered 2020-08-02: 4 mg via INTRAVENOUS

## 2020-08-02 MED ORDER — DEXTROSE 50 % IV SOLN
12.5000 g | INTRAVENOUS | Status: AC
  Administered 2020-08-02: 09:00:00 12.5 g via INTRAVENOUS

## 2020-08-02 MED ORDER — ORAL CARE MOUTH RINSE: 15.0000 mL | Freq: Once | OROMUCOSAL | Status: AC

## 2020-08-02 MED ORDER — 0.9 % SODIUM CHLORIDE (POUR BTL) OPTIME
TOPICAL | Status: DC | PRN
  Administered 2020-08-02: 02:00:00 1000 mL

## 2020-08-02 MED ORDER — DILTIAZEM HCL-DEXTROSE 125-5 MG/125ML-% IV SOLN (PREMIX)
5.0000 mg/h | INTRAVENOUS | Status: DC
  Administered 2020-08-02: 23:00:00 5 mg/h via INTRAVENOUS
  Administered 2020-08-03: 15:00:00 10 mg/h via INTRAVENOUS
  Filled 2020-08-02 (×4): qty 125

## 2020-08-02 MED ORDER — STERILE WATER FOR IRRIGATION IR SOLN
Status: DC | PRN
  Administered 2020-08-02: 3000 mL

## 2020-08-02 MED ORDER — ROCURONIUM BROMIDE 10 MG/ML (PF) SYRINGE
PREFILLED_SYRINGE | INTRAVENOUS | Status: DC | PRN
  Administered 2020-08-02: 50 mg via INTRAVENOUS

## 2020-08-02 MED ORDER — MIDAZOLAM HCL 2 MG/2ML IJ SOLN
INTRAMUSCULAR | Status: AC
  Filled 2020-08-02: qty 2

## 2020-08-02 MED ORDER — ONDANSETRON HCL 4 MG/2ML IJ SOLN
INTRAMUSCULAR | Status: AC
  Filled 2020-08-02: qty 2

## 2020-08-02 MED ORDER — PHENYLEPHRINE 40 MCG/ML (10ML) SYRINGE FOR IV PUSH (FOR BLOOD PRESSURE SUPPORT)
PREFILLED_SYRINGE | INTRAVENOUS | Status: DC | PRN
  Administered 2020-08-02 (×4): 80 ug via INTRAVENOUS

## 2020-08-02 MED ORDER — SUGAMMADEX SODIUM 200 MG/2ML IV SOLN
INTRAVENOUS | Status: DC | PRN
  Administered 2020-08-02: 200 mg via INTRAVENOUS

## 2020-08-02 MED ORDER — VANCOMYCIN HCL IN DEXTROSE 1-5 GM/200ML-% IV SOLN
1000.0000 mg | Freq: Two times a day (BID) | INTRAVENOUS | Status: DC
  Administered 2020-08-02 – 2020-08-03 (×4): 1000 mg via INTRAVENOUS
  Filled 2020-08-02 (×5): qty 200

## 2020-08-02 MED ORDER — DILTIAZEM HCL 25 MG/5ML IV SOLN
5.0000 mg | INTRAVENOUS | Status: AC
  Administered 2020-08-02: 23:00:00 5 mg via INTRAVENOUS
  Filled 2020-08-02: qty 5

## 2020-08-02 MED ORDER — FENTANYL CITRATE (PF) 250 MCG/5ML IJ SOLN
INTRAMUSCULAR | Status: DC | PRN
  Administered 2020-08-02: 100 ug via INTRAVENOUS

## 2020-08-02 MED ORDER — ONDANSETRON HCL 4 MG/2ML IJ SOLN: 4.0000 mg | Freq: Four times a day (QID) | INTRAMUSCULAR | Status: DC | PRN

## 2020-08-02 MED ORDER — PROPOFOL 10 MG/ML IV BOLUS
INTRAVENOUS | Status: AC
  Filled 2020-08-02: qty 20

## 2020-08-02 MED ORDER — SUCCINYLCHOLINE CHLORIDE 200 MG/10ML IV SOSY
PREFILLED_SYRINGE | INTRAVENOUS | Status: DC | PRN
  Administered 2020-08-02: 100 mg via INTRAVENOUS

## 2020-08-02 MED ORDER — LACTATED RINGERS IV SOLN: INTRAVENOUS | Status: DC

## 2020-08-02 MED ORDER — HYDROMORPHONE HCL 1 MG/ML IJ SOLN: 0.2500 mg | INTRAMUSCULAR | Status: DC | PRN

## 2020-08-02 MED ORDER — PROPOFOL 10 MG/ML IV BOLUS
INTRAVENOUS | Status: DC | PRN
  Administered 2020-08-02: 100 mg via INTRAVENOUS

## 2020-08-02 MED ORDER — POLYETHYLENE GLYCOL 3350 17 G PO PACK: 17.0000 g | PACK | Freq: Every day | ORAL | Status: DC | PRN

## 2020-08-02 MED ORDER — ALBUMIN HUMAN 5 % IV SOLN: INTRAVENOUS | Status: DC | PRN

## 2020-08-02 MED ORDER — DEXAMETHASONE SODIUM PHOSPHATE 10 MG/ML IJ SOLN
INTRAMUSCULAR | Status: AC
  Filled 2020-08-02: qty 1

## 2020-08-02 MED ORDER — SUCCINYLCHOLINE CHLORIDE 200 MG/10ML IV SOSY
PREFILLED_SYRINGE | INTRAVENOUS | Status: AC
  Filled 2020-08-02: qty 10

## 2020-08-02 MED ORDER — PHENYLEPHRINE 40 MCG/ML (10ML) SYRINGE FOR IV PUSH (FOR BLOOD PRESSURE SUPPORT)
PREFILLED_SYRINGE | INTRAVENOUS | Status: AC
  Filled 2020-08-02: qty 10

## 2020-08-02 MED ORDER — PHENYLEPHRINE HCL-NACL 10-0.9 MG/250ML-% IV SOLN
INTRAVENOUS | Status: DC | PRN
  Administered 2020-08-02: 25 ug/min via INTRAVENOUS

## 2020-08-02 MED ORDER — PIPERACILLIN-TAZOBACTAM 3.375 G IVPB 30 MIN
3.3750 g | Freq: Three times a day (TID) | INTRAVENOUS | Status: DC
  Administered 2020-08-02 – 2020-08-03 (×4): 3.375 g via INTRAVENOUS
  Filled 2020-08-02 (×7): qty 50

## 2020-08-02 MED ORDER — ENOXAPARIN SODIUM 40 MG/0.4ML ~~LOC~~ SOLN: 40.0000 mg | SUBCUTANEOUS | Status: DC

## 2020-08-02 MED ORDER — DEXTROSE 50 % IV SOLN
12.5000 g | Freq: Once | INTRAVENOUS | Status: AC
  Administered 2020-08-02: 03:00:00 12.5 g via INTRAVENOUS

## 2020-08-02 MED ORDER — CHLORHEXIDINE GLUCONATE 0.12 % MT SOLN
OROMUCOSAL | Status: AC
  Administered 2020-08-02: 16:00:00 15 mL via OROMUCOSAL
  Filled 2020-08-02: qty 15

## 2020-08-02 MED ORDER — ONDANSETRON HCL 4 MG/2ML IJ SOLN
4.0000 mg | Freq: Once | INTRAMUSCULAR | Status: AC
  Administered 2020-08-02: 03:00:00 4 mg via INTRAVENOUS

## 2020-08-02 MED ORDER — LIDOCAINE 2% (20 MG/ML) 5 ML SYRINGE
INTRAMUSCULAR | Status: AC
  Filled 2020-08-02: qty 5

## 2020-08-02 MED ORDER — CHLORHEXIDINE GLUCONATE 0.12 % MT SOLN: 15.0000 mL | Freq: Once | OROMUCOSAL | Status: AC

## 2020-08-02 MED ORDER — FENTANYL CITRATE (PF) 250 MCG/5ML IJ SOLN
INTRAMUSCULAR | Status: AC
  Filled 2020-08-02: qty 5

## 2020-08-02 MED ORDER — METOPROLOL TARTRATE 50 MG PO TABS: 100.0000 mg | ORAL_TABLET | Freq: Once | ORAL | Status: AC

## 2020-08-02 MED ORDER — DOCUSATE SODIUM 100 MG PO CAPS
100.0000 mg | ORAL_CAPSULE | Freq: Two times a day (BID) | ORAL | Status: DC | PRN
Start: 2020-08-02 — End: 2020-08-16

## 2020-08-02 MED ORDER — MIDAZOLAM HCL 5 MG/5ML IJ SOLN
INTRAMUSCULAR | Status: DC | PRN
  Administered 2020-08-02: 2 mg via INTRAVENOUS

## 2020-08-02 NOTE — Transfer of Care (Signed)
Immediate Anesthesia Transfer of Care Note  Patient: Francisco Hutchinson  Procedure(s) Performed: EXPLORATION AND DEBRIDEMENT GROINS (Bilateral ) CYSTOSCOPY (N/A Bladder)  Patient Location: PACU  Anesthesia Type:General  Level of Consciousness: awake, alert  and oriented  Airway & Oxygen Therapy: Patient Spontanous Breathing and Patient connected to face mask oxygen  Post-op Assessment: Report given to RN and Post -op Vital signs reviewed and stable  Post vital signs: Reviewed and stable  Last Vitals:  Vitals Value Taken Time  BP 130/98 08/02/20 0220  Temp    Pulse 120 08/02/20 0218  Resp 32 08/02/20 0221  SpO2 100 % 08/02/20 0218  Vitals shown include unvalidated device data.  Last Pain:  Vitals:   08/02/20 0026  TempSrc: Oral  PainSc:          Complications: No complications documented.

## 2020-08-02 NOTE — Progress Notes (Signed)
Attempted to call patient's mother Danton Clap with update.  There was no answer and the mailbox was full.

## 2020-08-02 NOTE — Anesthesia Postprocedure Evaluation (Signed)
Anesthesia Post Note  Patient: Trew Sunde Parchment  Procedure(s) Performed: EXPLORATION AND DEBRIDEMENT GROINS (Bilateral ) CYSTOSCOPY (N/A Bladder)     Patient location during evaluation: PACU Anesthesia Type: General Level of consciousness: awake and alert Pain management: pain level controlled Vital Signs Assessment: post-procedure vital signs reviewed and stable Respiratory status: spontaneous breathing, nonlabored ventilation, respiratory function stable and patient connected to nasal cannula oxygen Cardiovascular status: blood pressure returned to baseline, stable and tachycardic Postop Assessment: no apparent nausea or vomiting Anesthetic complications: no   No complications documented.  Last Vitals:  Vitals:   08/02/20 0500 08/02/20 0530  BP: 109/70 107/85  Pulse: (!) 113 (!) 103  Resp: (!) 29 (!) 27  Temp:    SpO2: 98% 97%    Last Pain:  Vitals:   08/02/20 0530  TempSrc:   PainSc: Asleep                 Sayf Kerner,W. EDMOND

## 2020-08-02 NOTE — Progress Notes (Deleted)
Wound reevaluation was cancelled due to uncertain OR availability.Could possibly be well after midnight tonite due to other emergent cases currently going on. Patient is clinically stable, afebrile and feeling much better. Have posted for tomorrow to change packing under anesthesia, possible debridement if needed.Feel that can be postponed as it has been less than 24 hours since initial debridement and abscess felt to be well drained according to Dr Demzik who assisted in OR . Spoke to patient. . He is agreeable.  

## 2020-08-02 NOTE — Progress Notes (Signed)
Initial Nutrition Assessment  DOCUMENTATION CODES:   Not applicable  INTERVENTION:   When diet is advanced, add:  Ensure Enlive po BID, each supplement provides 350 kcal and 20 grams of protein  MVI with minerals daily  Juven 1 packet PO BID, each packet provides 80 calories, 8 grams of carbohydrate, 2.5  grams of protein (collagen), 7 grams of L-arginine and 7 grams of L-glutamine; supplement contains CaHMB, Vitamins C, E, B12 and Zinc to promote wound healing  NUTRITION DIAGNOSIS:   Increased nutrient needs related to wound healing as evidenced by estimated needs.  GOAL:   Patient will meet greater than or equal to 90% of their needs  MONITOR:   Diet advancement,PO intake,Skin,Labs,Supplement acceptance  REASON FOR ASSESSMENT:   Rounds    ASSESSMENT:   49 yo male admitted with Fournier's gangrene. S/P I&D of penile abscess on admission. PMH includes HTN, HIV, CVA, DM, R BKA, anxiety, depression.   Spoke with patient at bedside who reports he typically has a good appetite and eats well. He thinks his weight has been stable. He drinks Ensure supplements, like chocolate flavor.  Plans to return to the OR this afternoon, so patient is currently NPO.  Labs reviewed. Na 132, K 3.1, phos 2.4 CBG: 68-75  Medications reviewed and include K-Phos.   No recent weights available for review. Current weight was carried over from July 2021 records.   Suspect muscle depletion in patient's left leg is related to immobility due to right BKA.  NUTRITION - FOCUSED PHYSICAL EXAM:  Flowsheet Row Most Recent Value  Orbital Region No depletion  Upper Arm Region No depletion  Thoracic and Lumbar Region No depletion  Buccal Region No depletion  Temple Region No depletion  Clavicle Bone Region No depletion  Clavicle and Acromion Bone Region No depletion  Scapular Bone Region No depletion  Dorsal Hand No depletion  Patellar Region Severe depletion  Anterior Thigh Region Severe  depletion  Posterior Calf Region Severe depletion  Edema (RD Assessment) None  Hair Reviewed  Eyes Reviewed  Mouth Reviewed  Skin Reviewed  Nails Reviewed       Diet Order:   Diet Order            Diet NPO time specified  Diet effective now                 EDUCATION NEEDS:   Education needs have been addressed (encouraged adequate protein intake to support healing)  Skin:  Skin Assessment: Skin Integrity Issues: Skin Integrity Issues:: Incisions Incisions: Fournier's gangrene to penis, S/P I&D  Last BM:  no BM documented  Height:   Ht Readings from Last 1 Encounters:  08/02/20 5\' 8"  (1.727 m)    Weight:   Wt Readings from Last 1 Encounters:  08/02/20 82.5 kg    Ideal Body Weight:  65.5 kg (adjusted for BKA)  BMI:  29.6 (adjusted for BKA)  Estimated Nutritional Needs:   Kcal:  2300-2500  Protein:  125-150 gm  Fluid:  >/= 2.3 L    Lucas Mallow, RD, LDN, CNSC Please refer to Amion for contact information.

## 2020-08-02 NOTE — Progress Notes (Signed)
Phlebotomy contacted about am lab draw for Mr. Francisco Hutchinson in PACU.

## 2020-08-02 NOTE — Op Note (Signed)
Preoperative diagnosis: Penile abscess with suprapubic extension  Postoperative diagnosis: Penile abscess with suprapubic extension Surgery: Incision and drainage of penile abscess and suprapubic area and cystoscopy and insertion of catheter Surgeon: Dr. Nicki Reaper Tayra Dawe Assistant: Ladona Mow   The patient has the above diagnosis and consented the above procedure.  Preoperative antibiotics were given.  Extra care was taken with leg positioning to minimize risk of compartment syndrome and neuropathy and deep vein thrombosis.  Foley catheter was in place and was removed.  Decubitus ulcer on left side noted from recent admission and debridement  Extensive skin prep and cleaning was performed.  Abscess was draining at the left base of the penis approximately thumb size opening.  Rigid cystoscopy 23 [place] scope was utilized.  His urethra was very white and scarred and rigid.  It was very rigid near the prostatic urethra.  I could see the verumontanum.  He had a very high riding bladder neck.  I was able to empty the bladder over the bladder neck.  There was a small capacity bladder.  There was edema.  A lot of mucus or debris.  I passed a sensor wire.  Easily passed a 64 [place] council catheter in the bladder and irrigated and drained well.  In my opinion the patient did not have urethral perforation or bladder injury  The abscess cavity where it was open was extended upward towards the midline approximately 5 cm.  With cutting current and Bovie dissected down to to the through soft tissue.  Hemostasis was excellent.  There was 2 bleeders controlled with cautery.  I could gently finger dissect to the left and the right of the corporal body down to the level of the symphysis pubis.  On the patient's left side I could pass my finger down almost reaching the groin soft tissue near the perineum on the high left side.  Cultures had been sent at the beginning of the case.  There was some necrotic tissue on the  penile shaft remove deeper but overall the full-thickness look quite healthy at the skin that was opened later on down the shaft of the penis keeping the corona skin intact.  Careful debridement was utilized.  There was some yellowish likely future necrotic tissue on the corporal body on the left side I did not want to enter the corporal body.  Some was debrided and the rest was left in situ.  I could feel the catheter inferiorly at 6:00.  I did not open this with finger dissection because I did not want to tear the urethra.  The tissue looked clean in this area.  2 L of sterile water utilized for irrigation.  The penile shaft actually looked quite healthy except for the thin layer of yellowish future necrotic tissue.  The skin flaps were very mobile throughout the full length of the incision and healthy appearing.  After further irrigating I use a saline pack with ABD pads and mesh pants.  Foley catheter was left in place.  I think is best that the patient has a second look procedure as early as tomorrow.  Overall I thought the findings were very favorable based on the initial impression clinically.  Sepsis protocol initiated

## 2020-08-02 NOTE — Progress Notes (Signed)
Wound reevaluation was cancelled due to uncertain OR availability.Could possibly be well after midnight tonite due to other emergent cases currently going on. Patient is clinically stable, afebrile and feeling much better. Have posted for tomorrow to change packing under anesthesia, possible debridement if needed.Feel that can be postponed as it has been less than 24 hours since initial debridement and abscess felt to be well drained according to Dr Al Pimple who assisted in [place] . Spoke to patient. Marland Kitchen He is agreeable.

## 2020-08-02 NOTE — Progress Notes (Signed)
NAME:  Francisco Hutchinson, MRN:  [id number], DOB:  27-Sep-1970, LOS: 0 ADMISSION DATE:  08/01/2020,   Brief History:  49 year old male presented to the emergency room in septic shock due to Fournier's gangrene, urology consulted and was taken to the OR for I&D  Past Medical History:  Hypertension HIV CVA Diabetes mellitus Anxiety Depression  Consults:  Urology  Procedures:  I&D of penile abscess with further exploration of the suprapubic area.   Micro Data:  Influenza negative Covid negative 12/23 tissue culture >> MRSA PCR negative 12/23 blood culture >>  Antimicrobials:  Cefepime 12/22 Clindamycin 12/22 Vancomycin 12/22 >> Zosyn 12/23 >>  Interim history/subjective:  Patient went to the OR for I&D, he remained hemodynamically stable but will go back to the OR again for debridement today per urology  Objective   Blood pressure 117/87, pulse (!) 111, temperature 98 F (36.7 C), temperature source Oral, resp. rate (!) 23, height $RemoveBe'5\' 8"'WfeirXvGw$  (1.727 m), weight 82.5 kg, SpO2 99 %.        Intake/Output Summary (Last 24 hours) at 08/02/2020 1338 Last data filed at 08/02/2020 0600 Gross per 24 hour  Intake 1700 ml  Output 200 ml  Net 1500 ml   Filed Weights   08/02/20 0330  Weight: 82.5 kg    Examination: General: Middle-age Caucasian male, lying on the bed HENT: Atraumatic/normocephalic, mucous membranes are moist Lungs: Clear to auscultation no wheezing rales or rhonchi noted Cardiovascular: Regular rate and rhythm, mild systolic ejection murmur Abdomen: Soft, nontender, nondistended, positive bowel sounds, no rebound/rigidity/guarding. Extremities: Distal pulse intact x3.  Right BKA intact and clean.  No significant edema.  Surgical dressing was intact and was not removed at the request of surgery service. Neuro: Conscious alert and oriented x3.  Upper extremity generalized motor weakness though at baseline.  Lower extremity weakness at baseline. GU: Surgical  dressing intact.   Assessment & Plan:  Critically ill with Fournier's gangrene  Severe sepsis Status post I&D of the base of the penis for abscess. HIV disease, controlled Diabetes mellitus type 2  Plan for debridement in OR again later today Patient received vancomycin, cefepime and clindamycin in the ED Continue vancomycin IV Zosyn Follow-up wound cultures that was sent from the OR Continue IV fluid Antiretroviral medications are on hold for now due to n.p.o. status, will resume once comes back from the OR Monitor fingerstick with goal 140-180  Best practice (evaluated daily)  Diet: N.p.o. for operating room today. Pain/Anxiety/Delirium protocol (if indicated): As needed's available VAP protocol (if indicated): N/A DVT prophylaxis: Lovenox to be started later today after the operating room. GI prophylaxis: Protonix Glucose control: Insulin sliding scale Mobility: Bedrest for now Disposition: Admit to ICU  Goals of Care:  Code Status: Full  Labs   CBC: Recent Labs  Lab 08/01/20 1528  WBC 27.5*  NEUTROABS 25.6*  HGB 8.7*  HCT 28.1*  MCV 88.1  PLT 449*    Basic Metabolic Panel: Recent Labs  Lab 08/01/20 1528 08/02/20 0609  NA 131* 132*  K 3.1* 3.1*  CL 96* 101  CO2 24 22  GLUCOSE 143* 95  BUN 30* 28*  CREATININE 1.51* 1.37*  CALCIUM 7.7* 7.2*  MG  --  1.8  PHOS  --  2.4*   GFR: Estimated Creatinine Clearance: 69.8 mL/min (A) (by C-G formula based on SCr of 1.37 mg/dL (H)). Recent Labs  Lab 08/01/20 1528 08/01/20 1530 08/01/20 2033 08/02/20 0609 08/02/20 0612 08/02/20 1047  PROCALCITON  --   --   --  0.40  --   --   WBC 27.5*  --   --   --   --   --   LATICACIDVEN  --  2.3* 1.2  --  0.8 0.9    Liver Function Tests: Recent Labs  Lab 08/01/20 1528 08/02/20 0609  AST 17 17  ALT 12 11  ALKPHOS 103 73  BILITOT 0.8 0.7  PROT 6.2* 5.5*  ALBUMIN 1.7* 1.7*   No results for input(s): LIPASE, AMYLASE in the last 168 hours. No results for  input(s): AMMONIA in the last 168 hours.  ABG No results found for: PHART, PCO2ART, PO2ART, HCO3, TCO2, ACIDBASEDEF, O2SAT   Coagulation Profile: Recent Labs  Lab 08/02/20 1047  INR 1.5*    Cardiac Enzymes: No results for input(s): CKTOTAL, CKMB, CKMBINDEX, TROPONINI in the last 168 hours.  HbA1C: Hemoglobin A1C  Date/Time Value Ref Range Status  01/20/2019 12:00 AM 9.7  Final  09/27/2018 03:05 PM 8.9 (A) 4.0 - 5.6 % Final   Hgb A1c MFr Bld  Date/Time Value Ref Range Status  06/03/2018 12:00 AM 9.2 (A) 4.0 - 6.0 Final  04/16/2018 02:31 PM 10.0 (H) 4.6 - 6.5 % Final    Comment:    Glycemic Control Guidelines for People with Diabetes:Non Diabetic:  <6%Goal of Therapy: <7%Additional Action Suggested:  >8%     CBG: Recent Labs  Lab 08/02/20 0225 08/02/20 0304 08/02/20 0556 08/02/20 0837 08/02/20 1123  GLUCAP 66* 101* 90 68* 75     Past Medical History:  He,  has a past medical history of Allergy, Anxiety and depression (05/29/2017), Diabetes mellitus without complication ([place]), HIV (human immunodeficiency virus infection) ([place]), Hypertension, and Stroke ([place]).   Surgical History:   Past Surgical History:  Procedure Laterality Date   ABDOMINAL AORTOGRAM W/LOWER EXTREMITY N/A 01/20/2018   Procedure: ABDOMINAL AORTOGRAM W/LOWER EXTREMITY;  Surgeon: Waynetta Sandy, MD;  Location: [facility];  Service: Cardiovascular;  Laterality: N/A;   abscess removed from back and left armpit     AMPUTATION Right 01/22/2018   Procedure: RIGHT BELOW KNEE AMPUTATION;  Surgeon: [name], MD;  Location: [facility];  Service: Orthopedics;  Laterality: Right;   CYSTOSCOPY WITH STENT PLACEMENT N/A 08/01/2020   Procedure: CYSTOSCOPY;  Surgeon: Bjorn Loser, MD;  Location: [facility];  Service: Urology;  Laterality: N/A;   IRRIGATION AND DEBRIDEMENT BUTTOCKS Left 03/23/2017   Procedure: IRRIGATION AND DEBRIDEMENT LEFT BUTTOCK ABSCESS;  Surgeon: Excell Seltzer, MD;   Location: [facility];  Service: General;  Laterality: Left;   IRRIGATION AND DEBRIDEMENT BUTTOCKS N/A 03/25/2017   Procedure: IRRIGATION AND DEBRIDEMENT DRESSING CHANGE AND EXPLORATION OF PERINEAL WOUND; CYSTOSCOPY;  Surgeon: Excell Seltzer, MD;  Location: [facility];  Service: General;  Laterality: N/A;   PARS PLANA VITRECTOMY 27 GAUGE WITH 25 GAUGE PORT Left 08/31/2017   Surgery Right Arm     WOUND EXPLORATION Bilateral 08/01/2020   Procedure: EXPLORATION AND DEBRIDEMENT GROINS;  Surgeon: Bjorn Loser, MD;  Location: [facility];  Service: Urology;  Laterality: Bilateral;     Social History:   reports that he has never smoked. He has never used smokeless tobacco. He reports current alcohol use of about 2.0 standard drinks of alcohol per week. He reports that he does not use drugs.   Family History:  His family history includes Alcohol abuse in his father and sister; Arthritis in his mother; Cancer in his father, maternal grandfather, and paternal grandmother; Diabetes in his father and paternal grandmother; Early death  in his father; Heart attack in his maternal grandmother; Heart disease in his maternal grandmother; Hyperlipidemia in his father; Hypertension in his father, maternal grandmother, and mother.   Allergies No Known Allergies   Home Medications  Prior to Admission medications   Medication Sig Start Date End Date Taking? Authorizing Provider  acetaminophen (TYLENOL) 325 MG tablet Take 2 tablets (650 mg total) by mouth every 6 (six) hours as needed for mild pain, fever or headache (or Fever >/= 101). 12/29/17  Yes [name], MD  amLODipine (NORVASC) 5 MG tablet TAKE 1 TABLET BY MOUTH EVERY DAY Patient taking differently: Take 5 mg by mouth daily. 07/09/20  Yes [name], MD  aspirin EC 81 MG tablet Take 81 mg by mouth at bedtime.   Yes [provider]  atorvastatin (LIPITOR) 40 MG tablet Take 1 tablet (40 mg total) by mouth daily. 08/29/19  Yes [name],  MD  cyclobenzaprine (FLEXERIL) 10 MG tablet Take 1 tablet (10 mg total) by mouth 3 (three) times daily as needed for muscle spasms. 07/30/18  Yes [name], MD  DESCOVY 200-25 MG tablet Take 1 tablet by mouth at bedtime. 11/30/19  Yes Michel Bickers, MD  Dulaglutide (TRULICITY) 1.5 QQ/7.6PP SOPN INJECT 1 PEN SUBCUTANEOUSLY ONCE A WEEK ON SUNDAY Patient taking differently: Inject 1.5 mg into the skin once a week. Take on Sundays 12/14/19  Yes [name], MD  ferrous gluconate (FERGON) 324 MG tablet Take 1 tablet (324 mg total) by mouth daily with breakfast. 01/26/18  Yes Dhungel, Nishant, MD  hydrochlorothiazide (HYDRODIURIL) 25 MG tablet TAKE 1 TABLET BY MOUTH EVERY DAY Patient taking differently: Take 25 mg by mouth daily. 04/11/20  Yes [name], MD  Incontinence Supplies KIT Use as needed for incontinence 04/16/18  Yes [name], MD  JARDIANCE 10 MG TABS tablet TAKE 1 TABLET BY MOUTH EVERY DAY Patient taking differently: Take 10 mg by mouth daily. 07/09/20  Yes [name], MD  LANTUS SOLOSTAR 100 UNIT/ML Solostar Pen Inject 55 Units into the skin at bedtime. 07/27/19  Yes [name], MD  metoprolol tartrate (LOPRESSOR) 100 MG tablet Take 1 tablet (100 mg total) by mouth 2 (two) times daily. 12/05/19  Yes [name], MD  sertraline (ZOLOFT) 100 MG tablet TAKE 1 TABLET BY MOUTH TWICE A DAY Patient taking differently: Take 100 mg by mouth daily. 06/24/20  Yes [name], MD  TIVICAY 50 MG tablet Take 1 tablet (50 mg total) by mouth daily. 11/30/19  Yes Michel Bickers, MD  acetic acid 2 % otic solution Place 6 drops into the right ear every 3 (three) hours. Patient not taking: No sig reported 12/01/19   [name], MD  amoxicillin (AMOXIL) 875 MG tablet Take 1 tablet (875 mg total) by mouth 2 (two) times daily. Patient not taking: No sig reported 11/08/19   [name], MD  collagenase (SANTYL) ointment Apply 1 application topically daily. Patient not  taking: No sig reported 05/12/18   [name], MD  fenofibrate (TRICOR) 48 MG tablet Take 1 tablet by mouth once daily Patient not taking: Reported on 08/01/2020 04/11/20   [name], MD  fenofibrate micronized (ANTARA) 43 MG capsule TAKE 1 CAPSULE BY MOUTH ONCE DAILY BEFORE BREAKFAST Patient not taking: No sig reported 04/11/20   [name], MD    Total critical care time: 45 minutes  Performed by: [place] care  time was exclusive of separately billable procedures and treating other patients.   Critical care was necessary to treat or prevent imminent or life-threatening deterioration.   Critical care was time spent personally by me on the following activities: development of treatment plan with patient and/or surrogate as well as nursing, discussions with consultants, evaluation of patient's response to treatment, examination of patient, obtaining history from patient or surrogate, ordering and performing treatments and interventions, ordering and review of laboratory studies, ordering and review of radiographic studies, pulse oximetry and re-evaluation of patient's condition.   Jacky Kindle MD [facility] Pager: [phone] Mobile: [phone]

## 2020-08-02 NOTE — H&P (Signed)
NAME:  Francisco Hutchinson, MRN:  [id number], DOB:  [birth year], LOS: 0 ADMISSION DATE:  08/01/2020,   Brief History:  49 year old male presented to the emergency room in septic shock  History of Present Illness:  49 year old white male presented to the emergency room from nursing home today.  Patient was noted to have a reddened pustule on the base of his penis.  He denies any significant pain.  No nausea vomiting.  While in the emergency room he was determined to have Fournier's gangrene and taken emergently to the operating room by urology service.  Exploratory I&D was performed.  Patient became hypotensive as he was taken into the operating room and during some of the case.  He has since been weaned off of all vasoactive support.  He was extubated postoperatively.  Neurologically he is back at his baseline.  Patient's had multiple medical issues and is being very difficult to treat/control his diabetes mellitus.  Past Medical History:  Hypertension HIV CVA Diabetes mellitus Anxiety Depression   Consults:  Urology  Procedures:  I&D of penile abscess with further exploration of the suprapubic area.   Micro Data:  Influenza negative Covid negative Cultures pending from the OR  Antimicrobials:  Cefepime Vancomycin    Objective   Blood pressure 115/86, pulse 99, temperature (!) 97.5 F (36.4 C), resp. rate (!) 24, SpO2 96 %.        Intake/Output Summary (Last 24 hours) at 08/02/2020 0304 Last data filed at 08/02/2020 0200 Gross per 24 hour  Intake 1700 ml  Output 25 ml  Net 1675 ml   There were no vitals filed for this visit.  Examination: General: No acute distress HENT: Atraumatic/normocephalic mucous membranes are moist Lungs: Clear to auscultation no wheezing rales or rhonchi noted Cardiovascular: Irregularly irregular with a 1 out of 6 systolic ejection murmur Abdomen: Soft, nontender, nondistended, positive bowel sounds, no  rebound/rigidity/guarding. Extremities: Distal pulse intact x3.  Right BKA intact and clean.  No significant edema.  Surgical dressing was intact and was not removed at the request of surgery service. Neuro: Conscious alert and oriented x3.  Upper extremity generalized motor weakness though at baseline.  Lower extremity weakness at baseline. GU: Surgical dressing intact.   Assessment & Plan:  Fournier's gangrene  Severe sepsis Status post I&D of the base of the penis for abscess. HIV Diabetes mellitus type 2  Plan: Patient be transferred to the intensive care unit postoperatively. All vasoactive support is being currently weaned off. Patient is on room air and appears to be comfortable. Patient is given Zofran 4 mg IV for postanesthetic nausea. Plan is to take the patient back to the operating room today.  Therefore dressing changes will be completed by the surgical team only. Monitor I's/O's.  Do not adjust Foley catheter without urology input. Broad-spectrum antibiotics with vancomycin and Zosyn.  Best practice (evaluated daily)  Diet: N.p.o. for operating room today. Pain/Anxiety/Delirium protocol (if indicated): As needed's available VAP protocol (if indicated): N/A DVT prophylaxis: Lovenox to be started later today after the operating room. GI prophylaxis: Protonix Glucose control: Insulin sliding scale Mobility: Bedrest Disposition: Admit to ICU  Goals of Care:  Code Status: Full  Labs   CBC: Recent Labs  Lab 08/01/20 1528  WBC 27.5*  NEUTROABS 25.6*  HGB 8.7*  HCT 28.1*  MCV 88.1  PLT 449*    Basic Metabolic Panel: Recent Labs  Lab 08/01/20 1528  NA 131*  K 3.1*  CL 96*  CO2 24  GLUCOSE 143*  BUN 30*  CREATININE 1.51*  CALCIUM 7.7*   GFR: CrCl cannot be calculated (Unknown ideal weight.). Recent Labs  Lab 08/01/20 1528 08/01/20 1530 08/01/20 2033  WBC 27.5*  --   --   LATICACIDVEN  --  2.3* 1.2    Liver Function Tests: Recent Labs  Lab  08/01/20 1528  AST 17  ALT 12  ALKPHOS 103  BILITOT 0.8  PROT 6.2*  ALBUMIN 1.7*   No results for input(s): LIPASE, AMYLASE in the last 168 hours. No results for input(s): AMMONIA in the last 168 hours.  ABG No results found for: PHART, PCO2ART, PO2ART, HCO3, TCO2, ACIDBASEDEF, O2SAT   Coagulation Profile: No results for input(s): INR, PROTIME in the last 168 hours.  Cardiac Enzymes: No results for input(s): CKTOTAL, CKMB, CKMBINDEX, TROPONINI in the last 168 hours.  HbA1C: Hemoglobin A1C  Date/Time Value Ref Range Status  01/20/2019 12:00 AM 9.7  Final  09/27/2018 03:05 PM 8.9 (A) 4.0 - 5.6 % Final   Hgb A1c MFr Bld  Date/Time Value Ref Range Status  06/03/2018 12:00 AM 9.2 (A) 4.0 - 6.0 Final  04/16/2018 02:31 PM 10.0 (H) 4.6 - 6.5 % Final    Comment:    Glycemic Control Guidelines for People with Diabetes:Non Diabetic:  <6%Goal of Therapy: <7%Additional Action Suggested:  >8%     CBG: Recent Labs  Lab 08/02/20 0223 08/02/20 0225  GLUCAP 65* 75*     Past Medical History:  He,  has a past medical history of Allergy, Anxiety and depression (05/29/2017), Diabetes mellitus without complication ([place]), HIV (human immunodeficiency virus infection) ([place]), Hypertension, and Stroke ([place]).   Surgical History:   Past Surgical History:  Procedure Laterality Date  . ABDOMINAL AORTOGRAM W/LOWER EXTREMITY N/A 01/20/2018   Procedure: ABDOMINAL AORTOGRAM W/LOWER EXTREMITY;  Surgeon: Waynetta Sandy, MD;  Location: [facility];  Service: Cardiovascular;  Laterality: N/A;  . abscess removed from back and left armpit    . AMPUTATION Right 01/22/2018   Procedure: RIGHT BELOW KNEE AMPUTATION;  Surgeon: [name], MD;  Location: [facility];  Service: Orthopedics;  Laterality: Right;  . IRRIGATION AND DEBRIDEMENT BUTTOCKS Left 03/23/2017   Procedure: IRRIGATION AND DEBRIDEMENT LEFT BUTTOCK ABSCESS;  Surgeon: Excell Seltzer, MD;  Location: [facility];  Service: General;   Laterality: Left;  . IRRIGATION AND DEBRIDEMENT BUTTOCKS N/A 03/25/2017   Procedure: IRRIGATION AND DEBRIDEMENT DRESSING CHANGE AND EXPLORATION OF PERINEAL WOUND; CYSTOSCOPY;  Surgeon: Excell Seltzer, MD;  Location: [facility];  Service: General;  Laterality: N/A;  . PARS PLANA VITRECTOMY 27 GAUGE WITH 25 GAUGE PORT Left 08/31/2017  . Surgery Right Arm       Social History:   reports that he has never smoked. He has never used smokeless tobacco. He reports current alcohol use of about 2.0 standard drinks of alcohol per week. He reports that he does not use drugs.   Family History:  His family history includes Alcohol abuse in his father and sister; Arthritis in his mother; Cancer in his father, maternal grandfather, and paternal grandmother; Diabetes in his father and paternal grandmother; Early death in his father; Heart attack in his maternal grandmother; Heart disease in his maternal grandmother; Hyperlipidemia in his father; Hypertension in his father, maternal grandmother, and mother.   Allergies No Known Allergies   Home Medications  Prior to Admission medications   Medication Sig Start Date End Date Taking? Authorizing Provider  acetaminophen (TYLENOL) 325 MG tablet Take 2 tablets (  650 mg total) by mouth every 6 (six) hours as needed for mild pain, fever or headache (or Fever >/= 101). 12/29/17  Yes [name], MD  amLODipine (NORVASC) 5 MG tablet TAKE 1 TABLET BY MOUTH EVERY DAY Patient taking differently: Take 5 mg by mouth daily. 07/09/20  Yes [name], MD  aspirin EC 81 MG tablet Take 81 mg by mouth at bedtime.   Yes [provider]  atorvastatin (LIPITOR) 40 MG tablet Take 1 tablet (40 mg total) by mouth daily. 08/29/19  Yes [name], MD  cyclobenzaprine (FLEXERIL) 10 MG tablet Take 1 tablet (10 mg total) by mouth 3 (three) times daily as needed for muscle spasms. 07/30/18  Yes [name], MD  DESCOVY 200-25 MG tablet Take 1 tablet by mouth at  bedtime. 11/30/19  Yes Michel Bickers, MD  Dulaglutide (TRULICITY) 1.5 OE/7.0JJ SOPN INJECT 1 PEN SUBCUTANEOUSLY ONCE A WEEK ON SUNDAY Patient taking differently: Inject 1.5 mg into the skin once a week. Take on Sundays 12/14/19  Yes [name], MD  ferrous gluconate (FERGON) 324 MG tablet Take 1 tablet (324 mg total) by mouth daily with breakfast. 01/26/18  Yes Dhungel, Nishant, MD  hydrochlorothiazide (HYDRODIURIL) 25 MG tablet TAKE 1 TABLET BY MOUTH EVERY DAY Patient taking differently: Take 25 mg by mouth daily. 04/11/20  Yes [name], MD  Incontinence Supplies KIT Use as needed for incontinence 04/16/18  Yes [name], MD  JARDIANCE 10 MG TABS tablet TAKE 1 TABLET BY MOUTH EVERY DAY Patient taking differently: Take 10 mg by mouth daily. 07/09/20  Yes [name], MD  LANTUS SOLOSTAR 100 UNIT/ML Solostar Pen Inject 55 Units into the skin at bedtime. 07/27/19  Yes [name], MD  metoprolol tartrate (LOPRESSOR) 100 MG tablet Take 1 tablet (100 mg total) by mouth 2 (two) times daily. 12/05/19  Yes [name], MD  sertraline (ZOLOFT) 100 MG tablet TAKE 1 TABLET BY MOUTH TWICE A DAY Patient taking differently: Take 100 mg by mouth daily. 06/24/20  Yes [name], MD  TIVICAY 50 MG tablet Take 1 tablet (50 mg total) by mouth daily. 11/30/19  Yes Michel Bickers, MD  acetic acid 2 % otic solution Place 6 drops into the right ear every 3 (three) hours. Patient not taking: No sig reported 12/01/19   [name], MD  amoxicillin (AMOXIL) 875 MG tablet Take 1 tablet (875 mg total) by mouth 2 (two) times daily. Patient not taking: No sig reported 11/08/19   [name], MD  collagenase (SANTYL) ointment Apply 1 application topically daily. Patient not taking: No sig reported 05/12/18   [name], MD  fenofibrate (TRICOR) 48 MG tablet Take 1 tablet by mouth once daily Patient not taking: Reported on 08/01/2020 04/11/20   [name], MD  fenofibrate micronized  (ANTARA) 43 MG capsule TAKE 1 CAPSULE BY MOUTH ONCE DAILY BEFORE BREAKFAST Patient not taking: No sig reported 04/11/20   [name], MD     Critical care time: 50 minutes

## 2020-08-02 NOTE — Anesthesia Procedure Notes (Signed)
Procedure Name: Intubation Date/Time: 08/02/2020 1:08 AM Performed by: Jearld Pies, CRNA Pre-anesthesia Checklist: Patient identified, Emergency Drugs available, Suction available and Patient being monitored Patient Re-evaluated:Patient Re-evaluated prior to induction Oxygen Delivery Method: Circle System Utilized Preoxygenation: Pre-oxygenation with 100% oxygen Induction Type: IV induction and Rapid sequence Laryngoscope Size: 2 Grade View: Grade II Tube type: Oral Tube size: 7.5 mm Number of attempts: 2 Airway Equipment and Method: Stylet Placement Confirmation: ETT inserted through vocal cords under direct vision,  positive ETCO2 and breath sounds checked- equal and bilateral Secured at: 23 cm Tube secured with: Tape Dental Injury: Teeth and Oropharynx as per pre-operative assessment  Difficulty Due To: Difficult Airway- due to reduced neck mobility Comments: DL x 1, G3 view, esophageal intubation, removed ETT, DL x 2, G2 view, + intubation

## 2020-08-02 NOTE — Progress Notes (Signed)
eLink Physician-Brief Progress Note Patient Name: Francisco Hutchinson DOB: [birth year] MRN: [id number]   Date of Service  08/02/2020  HPI/Events of Note  Patient in Afib with RVR, he also needs  an order to continue chronic Foley catheter.  eICU Interventions  Diltiazem 5 mg iv followed by an infusion  Starting at 5 mg / dl ordered. Order entered to continue Foley catheter.        Kerry Kass Betsy Rosello 08/02/2020, 10:33 PM

## 2020-08-02 NOTE — Progress Notes (Signed)
Pharmacy Antibiotic Note  Francisco Hutchinson is a 49 y.o. male admitted on 08/01/2020 with Fournier gangrene, now s/p initial I&D.  Pharmacy has been consulted for vancomycin dosing.  Plan: Vancomycin 1000mg  IV every 12 hours.  Goal trough 15-20 mcg/mL.  MD changed cefepime to Zosyn.  Temp (24hrs), Avg:98.4 F (36.9 C), Min:97.5 F (36.4 C), Max:99.3 F (37.4 C)  Recent Labs  Lab 08/01/20 1528 08/01/20 1530 08/01/20 2033  WBC 27.5*  --   --   CREATININE 1.51*  --   --   LATICACIDVEN  --  2.3* 1.2    Estimated Creatinine Clearance: 63.3 mL/min (A) (by C-G formula based on SCr of 1.51 mg/dL (H)).    No Known Allergies   Thank you for allowing pharmacy to be a part of this patient's care.  Wynona Neat, PharmD, BCPS  08/02/2020 3:49 AM

## 2020-08-03 ENCOUNTER — Inpatient Hospital Stay (HOSPITAL_COMMUNITY): Payer: Medicare Other

## 2020-08-03 ENCOUNTER — Inpatient Hospital Stay (HOSPITAL_COMMUNITY): Payer: Medicare Other | Admitting: Certified Registered Nurse Anesthetist

## 2020-08-03 ENCOUNTER — Encounter (HOSPITAL_COMMUNITY): Payer: Self-pay | Admitting: Certified Registered Nurse Anesthetist

## 2020-08-03 ENCOUNTER — Encounter (HOSPITAL_COMMUNITY)
Admission: EM | Disposition: A | Discharge status: Other Acute Inpatient Hospital | Payer: Self-pay | Source: Home / Self Care | Attending: Internal Medicine

## 2020-08-03 ENCOUNTER — Encounter (HOSPITAL_COMMUNITY): Payer: Self-pay | Admitting: Pulmonary Disease

## 2020-08-03 DIAGNOSIS — N493 Fournier gangrene: Secondary | ICD-10-CM | POA: Diagnosis not present

## 2020-08-03 DIAGNOSIS — N4821 Abscess of corpus cavernosum and penis: Secondary | ICD-10-CM

## 2020-08-03 DIAGNOSIS — A419 Sepsis, unspecified organism: Secondary | ICD-10-CM | POA: Diagnosis not present

## 2020-08-03 HISTORY — PX: WOUND EXPLORATION: SHX6188

## 2020-08-03 LAB — PHOSPHORUS: Phosphorus: 3.6 mg/dL (ref 2.5–4.6)

## 2020-08-03 LAB — COMPREHENSIVE METABOLIC PANEL
ALT: 12 U/L (ref 0–44)
AST: 16 U/L (ref 15–41)
Albumin: 1.5 g/dL — ABNORMAL LOW (ref 3.5–5.0)
Alkaline Phosphatase: 74 U/L (ref 38–126)
Anion gap: 7 (ref 5–15)
BUN: 28 mg/dL — ABNORMAL HIGH (ref 6–20)
CO2: 23 mmol/L (ref 22–32)
Calcium: 7.2 mg/dL — ABNORMAL LOW (ref 8.9–10.3)
Chloride: 101 mmol/L (ref 98–111)
Creatinine, Ser: 1.56 mg/dL — ABNORMAL HIGH (ref 0.61–1.24)
GFR, Estimated: 55 mL/min — ABNORMAL LOW (ref >60)
Glucose, Bld: 160 mg/dL — ABNORMAL HIGH (ref 70–99)
Potassium: 3.2 mmol/L — ABNORMAL LOW (ref 3.5–5.1)
Sodium: 131 mmol/L — ABNORMAL LOW (ref 135–145)
Total Bilirubin: 0.9 mg/dL (ref 0.3–1.2)
Total Protein: 5.2 g/dL — ABNORMAL LOW (ref 6.5–8.1)

## 2020-08-03 LAB — CBC
HCT: 20.8 % — ABNORMAL LOW (ref 39.0–52.0)
Hemoglobin: 7 g/dL — ABNORMAL LOW (ref 13.0–17.0)
MCH: 27.8 pg (ref 26.0–34.0)
MCHC: 33.7 g/dL (ref 30.0–36.0)
MCV: 82.5 fL (ref 80.0–100.0)
Platelets: 347 10*3/uL (ref 150–400)
RBC: 2.52 MIL/uL — ABNORMAL LOW (ref 4.22–5.81)
RDW: 16.8 % — ABNORMAL HIGH (ref 11.5–15.5)
WBC: 21 10*3/uL — ABNORMAL HIGH (ref 4.0–10.5)
nRBC: 0 % (ref 0.0–0.2)

## 2020-08-03 LAB — GLUCOSE, CAPILLARY
Glucose-Capillary: 123 mg/dL — ABNORMAL HIGH (ref 70–99)
Glucose-Capillary: 149 mg/dL — ABNORMAL HIGH (ref 70–99)
Glucose-Capillary: 155 mg/dL — ABNORMAL HIGH (ref 70–99)
Glucose-Capillary: 194 mg/dL — ABNORMAL HIGH (ref 70–99)
Glucose-Capillary: 201 mg/dL — ABNORMAL HIGH (ref 70–99)
Glucose-Capillary: 208 mg/dL — ABNORMAL HIGH (ref 70–99)

## 2020-08-03 LAB — MAGNESIUM: Magnesium: 1.9 mg/dL (ref 1.7–2.4)

## 2020-08-03 SURGERY — WOUND EXPLORATION
Anesthesia: General

## 2020-08-03 MED ORDER — LIDOCAINE HCL (CARDIAC) PF 100 MG/5ML IV SOSY
PREFILLED_SYRINGE | INTRAVENOUS | Status: DC | PRN
  Administered 2020-08-03: 100 mg via INTRATRACHEAL

## 2020-08-03 MED ORDER — CHLORHEXIDINE GLUCONATE 0.12 % MT SOLN
OROMUCOSAL | Status: AC
  Filled 2020-08-03: qty 15

## 2020-08-03 MED ORDER — ACETAMINOPHEN 10 MG/ML IV SOLN: 1000.0000 mg | Freq: Once | INTRAVENOUS | Status: DC | PRN

## 2020-08-03 MED ORDER — POTASSIUM CHLORIDE 10 MEQ/100ML IV SOLN
10.0000 meq | INTRAVENOUS | Status: AC
  Administered 2020-08-03 (×3): 10 meq via INTRAVENOUS
  Filled 2020-08-03 (×4): qty 100

## 2020-08-03 MED ORDER — DILTIAZEM LOAD VIA INFUSION: INTRAVENOUS | Status: DC | PRN

## 2020-08-03 MED ORDER — PROPOFOL 10 MG/ML IV BOLUS
INTRAVENOUS | Status: AC
  Filled 2020-08-03: qty 20

## 2020-08-03 MED ORDER — SUCCINYLCHOLINE 20MG/ML (10ML) SYRINGE FOR MEDFUSION PUMP - OPTIME
INTRAMUSCULAR | Status: DC | PRN
  Administered 2020-08-03: 120 mg via INTRAVENOUS

## 2020-08-03 MED ORDER — FENTANYL CITRATE (PF) 250 MCG/5ML IJ SOLN
INTRAMUSCULAR | Status: DC | PRN
  Administered 2020-08-03: 50 ug via INTRAVENOUS

## 2020-08-03 MED ORDER — FENTANYL CITRATE (PF) 250 MCG/5ML IJ SOLN
INTRAMUSCULAR | Status: AC
  Filled 2020-08-03: qty 5

## 2020-08-03 MED ORDER — PIPERACILLIN-TAZOBACTAM 3.375 G IVPB
3.3750 g | Freq: Three times a day (TID) | INTRAVENOUS | Status: DC
  Administered 2020-08-03 – 2020-08-06 (×8): 3.375 g via INTRAVENOUS
  Filled 2020-08-03 (×11): qty 50

## 2020-08-03 MED ORDER — HYDROMORPHONE HCL 1 MG/ML IJ SOLN
INTRAMUSCULAR | Status: AC
  Filled 2020-08-03: qty 1

## 2020-08-03 MED ORDER — LACTATED RINGERS IV SOLN: INTRAVENOUS | Status: DC

## 2020-08-03 MED ORDER — ROCURONIUM 10MG/ML (10ML) SYRINGE FOR MEDFUSION PUMP - OPTIME
INTRAVENOUS | Status: DC | PRN
  Administered 2020-08-03: 50 mg via INTRAVENOUS

## 2020-08-03 MED ORDER — POTASSIUM CHLORIDE 10 MEQ/100ML IV SOLN
10.0000 meq | Freq: Once | INTRAVENOUS | Status: AC
  Administered 2020-08-03: 20:00:00 10 meq via INTRAVENOUS

## 2020-08-03 MED ORDER — SODIUM CHLORIDE 0.9 % IR SOLN
Status: DC | PRN
  Administered 2020-08-03: 3000 mL

## 2020-08-03 MED ORDER — ONDANSETRON HCL 4 MG/2ML IJ SOLN
INTRAMUSCULAR | Status: DC | PRN
  Administered 2020-08-03: 4 mg via INTRAVENOUS

## 2020-08-03 MED ORDER — PHENYLEPHRINE HCL (PRESSORS) 10 MG/ML IV SOLN
INTRAVENOUS | Status: DC | PRN
  Administered 2020-08-03 (×2): 120 ug via INTRAVENOUS

## 2020-08-03 MED ORDER — SUGAMMADEX SODIUM 200 MG/2ML IV SOLN
INTRAVENOUS | Status: DC | PRN
  Administered 2020-08-03: 200 mg via INTRAVENOUS

## 2020-08-03 MED ORDER — CHLORHEXIDINE GLUCONATE CLOTH 2 % EX PADS
6.0000 | MEDICATED_PAD | Freq: Every day | CUTANEOUS | Status: DC
  Administered 2020-08-03 – 2020-08-15 (×13): 6 via TOPICAL

## 2020-08-03 MED ORDER — HYDROMORPHONE HCL 1 MG/ML IJ SOLN
0.2500 mg | INTRAMUSCULAR | Status: DC | PRN
  Administered 2020-08-03: 0.5 mg via INTRAVENOUS

## 2020-08-03 MED ORDER — MAGNESIUM SULFATE 2 GM/50ML IV SOLN
2.0000 g | Freq: Once | INTRAVENOUS | Status: AC
  Administered 2020-08-03: 11:00:00 2 g via INTRAVENOUS
  Filled 2020-08-03: qty 50

## 2020-08-03 MED ORDER — MIDAZOLAM HCL 2 MG/2ML IJ SOLN
INTRAMUSCULAR | Status: AC
  Filled 2020-08-03: qty 2

## 2020-08-03 MED ORDER — ONDANSETRON HCL 4 MG/2ML IJ SOLN: 4.0000 mg | Freq: Once | INTRAMUSCULAR | Status: DC | PRN

## 2020-08-03 MED ORDER — CHLORHEXIDINE GLUCONATE 0.12 % MT SOLN
15.0000 mL | OROMUCOSAL | Status: AC
  Administered 2020-08-03: 12:00:00 15 mL via OROMUCOSAL

## 2020-08-03 MED ORDER — 0.9 % SODIUM CHLORIDE (POUR BTL) OPTIME
TOPICAL | Status: DC | PRN
  Administered 2020-08-03: 14:00:00 1000 mL

## 2020-08-03 MED ORDER — MIDAZOLAM HCL 2 MG/2ML IJ SOLN
INTRAMUSCULAR | Status: DC | PRN
  Administered 2020-08-03: 2 mg via INTRAVENOUS

## 2020-08-03 MED ORDER — POTASSIUM CHLORIDE 10 MEQ/100ML IV SOLN
10.0000 meq | INTRAVENOUS | Status: AC
  Administered 2020-08-03: 18:00:00 10 meq via INTRAVENOUS
  Filled 2020-08-03 (×3): qty 100

## 2020-08-03 MED ORDER — PROPOFOL 10 MG/ML IV BOLUS
INTRAVENOUS | Status: DC | PRN
  Administered 2020-08-03: 150 mg via INTRAVENOUS

## 2020-08-03 SURGICAL SUPPLY — 30 items
BAG DECANTER FOR FLEXI CONT (MISCELLANEOUS) ×2 IMPLANT
BLADE SURG 15 STRL LF DISP TIS (BLADE) ×1 IMPLANT
BLADE SURG 15 STRL SS (BLADE) ×1
BNDG ELASTIC 4X5.8 VLCR NS LF (GAUZE/BANDAGES/DRESSINGS) ×1 IMPLANT
CLEANER TIP ELECTROSURG 2X2 (MISCELLANEOUS) ×2 IMPLANT
COVER SURGICAL LIGHT HANDLE (MISCELLANEOUS) ×4 IMPLANT
COVER WAND RF STERILE (DRAPES) ×2 IMPLANT
DRAPE HALF SHEET 40X57 (DRAPES) ×1 IMPLANT
ELECT CAUTERY BLADE 6.4 (BLADE) ×2 IMPLANT
ELECT REM PT RETURN 9FT ADLT (ELECTROSURGICAL) ×2
ELECTRODE REM PT RTRN 9FT ADLT (ELECTROSURGICAL) ×1 IMPLANT
GAUZE 4X4 16PLY RFD (DISPOSABLE) ×2 IMPLANT
GLOVE BIO SURGEON STRL SZ7.5 (GLOVE) ×2 IMPLANT
GOWN STRL REUS W/ TWL LRG LVL3 (GOWN DISPOSABLE) ×2 IMPLANT
GOWN STRL REUS W/TWL LRG LVL3 (GOWN DISPOSABLE) ×2
HANDPIECE INTERPULSE COAX TIP (DISPOSABLE) ×1
KIT BASIN OR (CUSTOM PROCEDURE TRAY) ×2 IMPLANT
KIT TURNOVER KIT B (KITS) ×2 IMPLANT
LEGGING LITHOTOMY PAIR STRL (DRAPES) IMPLANT
NS IRRIG 1000ML POUR BTL (IV SOLUTION) ×2 IMPLANT
PACK SURGICAL SETUP 50X90 (CUSTOM PROCEDURE TRAY) ×2 IMPLANT
PAD ARMBOARD 7.5X6 YLW CONV (MISCELLANEOUS) ×4 IMPLANT
PENCIL BUTTON HOLSTER BLD 10FT (ELECTRODE) ×2 IMPLANT
SET HNDPC FAN SPRY TIP SCT (DISPOSABLE) IMPLANT
SPONGE LAP 4X18 RFD (DISPOSABLE) ×1 IMPLANT
SYR CONTROL 10ML LL (SYRINGE) ×2 IMPLANT
TOWEL GREEN STERILE FF (TOWEL DISPOSABLE) ×2 IMPLANT
TUBE CONNECTING 12X1/4 (SUCTIONS) ×2 IMPLANT
WATER STERILE IRR 1000ML POUR (IV SOLUTION) ×2 IMPLANT
YANKAUER SUCT BULB TIP NO VENT (SUCTIONS) ×2 IMPLANT

## 2020-08-03 NOTE — Progress Notes (Signed)
[facility] ADULT ICU REPLACEMENT PROTOCOL   The patient does apply for the [facility] Adult ICU Electrolyte Replacment Protocol based on the criteria listed below:   1. Is GFR >/= 30 ml/min? Yes.    Patient's GFR today is 55 2. Is SCr </= 2? Yes.   Patient's SCr is 1.56 ml/kg/hr 3. Did SCr increase >/= 0.5 in 24 hours? No. 4. Abnormal electrolyte(s): K 3.2 5. Ordered repletion with: per protocol 6. If a panic level lab has been reported, has the CCM MD in charge been notified? No..   Physician:    Sedonia Small 08/03/2020 6:05 AM

## 2020-08-03 NOTE — Anesthesia Preprocedure Evaluation (Signed)
Anesthesia Evaluation  Patient identified by MRN, date of birth, ID band Patient awake    Reviewed: Allergy & Precautions, H&P , NPO status , Patient's Chart, lab work & pertinent test results, reviewed documented beta blocker date and time   Airway Mallampati: II  TM Distance: >3 FB Neck ROM: Full    Dental no notable dental hx. (+) Teeth Intact, Dental Advisory Given   Pulmonary neg pulmonary ROS,    Pulmonary exam normal breath sounds clear to auscultation       Cardiovascular hypertension, Pt. on medications and Pt. on home beta blockers  Rhythm:Regular Rate:Normal     Neuro/Psych Anxiety Depression CVA, Residual Symptoms    GI/Hepatic negative GI ROS, Neg liver ROS,   Endo/Other  diabetes, Poorly Controlled, Insulin Dependent  Renal/GU Renal InsufficiencyRenal disease  negative genitourinary   Musculoskeletal   Abdominal (+)  Abdomen: soft. Bowel sounds: normal.  Peds  Hematology  (+) HIV,   Anesthesia Other Findings   Reproductive/Obstetrics negative OB ROS                             Anesthesia Physical  Anesthesia Plan  ASA: III  Anesthesia Plan: General   Post-op Pain Management:    Induction: Intravenous  PONV Risk Score and Plan: 3 and Ondansetron, Midazolam, Treatment may vary due to age or medical condition and Dexamethasone  Airway Management Planned: Oral ETT and Mask  Additional Equipment: None  Intra-op Plan:   Post-operative Plan: Extubation in OR  Informed Consent: I have reviewed the patients History and Physical, chart, labs and discussed the procedure including the risks, benefits and alternatives for the proposed anesthesia with the patient or authorized representative who has indicated his/her understanding and acceptance.     Dental advisory given  Plan Discussed with: CRNA  Anesthesia Plan Comments: (Lab Results      Component                 Value               Date                      WBC                      21.0 (H)            08/03/2020                HGB                      7.0 (L)             08/03/2020                HCT                      20.8 (L)            08/03/2020                MCV                      82.5                08/03/2020                PLT  347                 08/03/2020           Lab Results      Component                Value               Date                      NA                       131 (L)             08/03/2020                K                        3.2 (L)             08/03/2020                CO2                      23                  08/03/2020                GLUCOSE                  160 (H)             08/03/2020                BUN                      28 (H)              08/03/2020                CREATININE               1.56 (H)            08/03/2020                CALCIUM                  7.2 (L)             08/03/2020                GFRNONAA                 55 (L)              08/03/2020                GFRAA                    >60                 01/25/2018          )        Anesthesia Quick Evaluation

## 2020-08-03 NOTE — Interval H&P Note (Signed)
History and Physical Interval Note:  08/03/2020 12:15 PM  Francisco Hutchinson  has presented today for surgery, with the diagnosis of Fournier gangrene.  The various methods of treatment have been discussed with the patient and family. After consideration of risks, benefits and other options for treatment, the patient has consented to  Procedure(s): Re-exploration and debridement of penis and groins (N/A) as a surgical intervention.  The patient's history has been reviewed, patient examined, no change in status, stable for surgery.  I have reviewed the patient's chart and labs.  Questions were answered to the patient's satisfaction.     Gail Creekmore D Kendrea Cerritos

## 2020-08-03 NOTE — Progress Notes (Signed)
NAME:  Francisco Hutchinson, MRN:  [id number], DOB:  21-Oct-1970, LOS: 1 ADMISSION DATE:  08/01/2020,   Brief History:  49 year old male presented to the emergency room in septic shock due to Fournier's gangrene, urology consulted and was taken to the OR for I&D  Past Medical History:  Hypertension HIV CVA Diabetes mellitus Anxiety Depression  Consults:  Urology  Procedures:  I&D of penile abscess with further exploration of the suprapubic area.   Micro Data:  Influenza negative Covid negative 12/23 tissue culture >> MRSA PCR negative 12/23 blood culture >>  Antimicrobials:  Cefepime 12/22 Clindamycin 12/22 Vancomycin 12/22 >> Zosyn 12/23 >>  Interim history/subjective:  Patient went to the OR yesterday but did not have repeat I&D done because of few emergency cases, today he is going back to the OR for further debridement and dressing change under general anesthesia  Objective   Blood pressure 112/90, pulse (!) 103, temperature 98.1 F (36.7 C), temperature source Oral, resp. rate 15, height $RemoveBe'5\' 8"'gyZPpvMCq$  (1.727 m), weight 89.3 kg, SpO2 97 %.        Intake/Output Summary (Last 24 hours) at 08/03/2020 1135 Last data filed at 08/03/2020 0602 Gross per 24 hour  Intake 651.3 ml  Output 800 ml  Net -148.7 ml   Filed Weights   08/02/20 0330 08/02/20 1600 08/03/20 0500  Weight: 82.5 kg 110.2 kg 89.3 kg    Examination: General: Middle-age Caucasian male, lying on the bed HENT: Atraumatic/normocephalic, mucous membranes are moist Lungs: Clear to auscultation no wheezing rales or rhonchi noted Cardiovascular: Regular rate and rhythm, mild systolic ejection murmur Abdomen: Soft, nontender, nondistended, positive bowel sounds, no rebound/rigidity/guarding. Extremities: Distal pulse intact x3.  Right BKA intact and clean.  No significant edema.  Surgical dressing was intact and was not removed at the request of surgery service. Neuro: Conscious alert and oriented x3.  Upper  extremity generalized motor weakness though at baseline.  Lower extremity weakness at baseline. GU: Surgical dressing intact.   Assessment & Plan:  Critically ill with Fournier's gangrene  Severe sepsis Status post I&D of the base of the penis for abscess. HIV disease, controlled Diabetes mellitus type 2  Plan for debridement in OR again today He is afebrile now Continue IV vancomycin and Zosyn Wound culture growing gram-positive cocci and gram-positive rods Continue IV fluid Antiretroviral medications are on hold for now due to n.p.o. status, will resume once comes back from the OR Monitor fingerstick with goal 140-180  Best practice (evaluated daily)  Diet: N.p.o. for operating room today. Pain/Anxiety/Delirium protocol (if indicated): As needed's available VAP protocol (if indicated): N/A DVT prophylaxis: Lovenox to be started later today after the operating room. GI prophylaxis: Protonix Glucose control: Insulin sliding scale Mobility: Bedrest for now Disposition: Admit to ICU  Goals of Care:  Code Status: Full  Labs   CBC: Recent Labs  Lab 08/01/20 1528 08/03/20 0056  WBC 27.5* 21.0*  NEUTROABS 25.6*  --   HGB 8.7* 7.0*  HCT 28.1* 20.8*  MCV 88.1 82.5  PLT 449* 720    Basic Metabolic Panel: Recent Labs  Lab 08/01/20 1528 08/02/20 0609 08/03/20 0056  NA 131* 132* 131*  K 3.1* 3.1* 3.2*  CL 96* 101 101  CO2 $Re'24 22 23  'QfI$ GLUCOSE 143* 95 160*  BUN 30* 28* 28*  CREATININE 1.51* 1.37* 1.56*  CALCIUM 7.7* 7.2* 7.2*  MG  --  1.8 1.9  PHOS  --  2.4* 3.6   GFR: Estimated Creatinine Clearance: 63.6  mL/min (A) (by C-G formula based on SCr of 1.56 mg/dL (H)). Recent Labs  Lab 08/01/20 1528 08/01/20 1530 08/01/20 2033 08/02/20 0609 08/02/20 0612 08/02/20 1047 08/03/20 0056  PROCALCITON  --   --   --  0.40  --   --   --   WBC 27.5*  --   --   --   --   --  21.0*  LATICACIDVEN  --  2.3* 1.2  --  0.8 0.9  --     Liver Function Tests: Recent Labs  Lab  08/01/20 1528 08/02/20 0609 08/03/20 0056  AST $Re'17 17 16  'GOo$ ALT $R'12 11 12  'wQ$ ALKPHOS 103 73 74  BILITOT 0.8 0.7 0.9  PROT 6.2* 5.5* 5.2*  ALBUMIN 1.7* 1.7* 1.5*   No results for input(s): LIPASE, AMYLASE in the last 168 hours. No results for input(s): AMMONIA in the last 168 hours.  ABG No results found for: PHART, PCO2ART, PO2ART, HCO3, TCO2, ACIDBASEDEF, O2SAT   Coagulation Profile: Recent Labs  Lab 08/02/20 1047  INR 1.5*    Cardiac Enzymes: No results for input(s): CKTOTAL, CKMB, CKMBINDEX, TROPONINI in the last 168 hours.  HbA1C: Hemoglobin A1C  Date/Time Value Ref Range Status  01/20/2019 12:00 AM 9.7  Final  09/27/2018 03:05 PM 8.9 (A) 4.0 - 5.6 % Final   Hgb A1c MFr Bld  Date/Time Value Ref Range Status  06/03/2018 12:00 AM 9.2 (A) 4.0 - 6.0 Final  04/16/2018 02:31 PM 10.0 (H) 4.6 - 6.5 % Final    Comment:    Glycemic Control Guidelines for People with Diabetes:Non Diabetic:  <6%Goal of Therapy: <7%Additional Action Suggested:  >8%     CBG: Recent Labs  Lab 08/02/20 2013 08/02/20 2101 08/02/20 2314 08/03/20 0311 08/03/20 0738  GLUCAP 52* 113* 153* 155* 149*     Past Medical History:  He,  has a past medical history of Allergy, Anxiety and depression (05/29/2017), Diabetes mellitus without complication ([place]), HIV (human immunodeficiency virus infection) ([place]), Hypertension, and Stroke ([place]).   Surgical History:   Past Surgical History:  Procedure Laterality Date  . ABDOMINAL AORTOGRAM W/LOWER EXTREMITY N/A 01/20/2018   Procedure: ABDOMINAL AORTOGRAM W/LOWER EXTREMITY;  Surgeon: Waynetta Sandy, MD;  Location: [facility];  Service: Cardiovascular;  Laterality: N/A;  . abscess removed from back and left armpit    . AMPUTATION Right 01/22/2018   Procedure: RIGHT BELOW KNEE AMPUTATION;  Surgeon: [name], MD;  Location: [facility];  Service: Orthopedics;  Laterality: Right;  . CYSTOSCOPY WITH STENT PLACEMENT N/A 08/01/2020   Procedure:  CYSTOSCOPY;  Surgeon: Bjorn Loser, MD;  Location: [facility];  Service: Urology;  Laterality: N/A;  . IRRIGATION AND DEBRIDEMENT BUTTOCKS Left 03/23/2017   Procedure: IRRIGATION AND DEBRIDEMENT LEFT BUTTOCK ABSCESS;  Surgeon: Excell Seltzer, MD;  Location: [facility];  Service: General;  Laterality: Left;  . IRRIGATION AND DEBRIDEMENT BUTTOCKS N/A 03/25/2017   Procedure: IRRIGATION AND DEBRIDEMENT DRESSING CHANGE AND EXPLORATION OF PERINEAL WOUND; CYSTOSCOPY;  Surgeon: Excell Seltzer, MD;  Location: [facility];  Service: General;  Laterality: N/A;  . PARS PLANA VITRECTOMY 27 GAUGE WITH 25 GAUGE PORT Left 08/31/2017  . Surgery Right Arm    . WOUND EXPLORATION Bilateral 08/01/2020   Procedure: EXPLORATION AND DEBRIDEMENT GROINS;  Surgeon: Bjorn Loser, MD;  Location: [facility];  Service: Urology;  Laterality: Bilateral;     Social History:   reports that he has never smoked. He has never used smokeless tobacco. He reports current alcohol  use of about 2.0 standard drinks of alcohol per week. He reports that he does not use drugs.   Family History:  His family history includes Alcohol abuse in his father and sister; Arthritis in his mother; Cancer in his father, maternal grandfather, and paternal grandmother; Diabetes in his father and paternal grandmother; Early death in his father; Heart attack in his maternal grandmother; Heart disease in his maternal grandmother; Hyperlipidemia in his father; Hypertension in his father, maternal grandmother, and mother.   Allergies No Known Allergies   Home Medications  Prior to Admission medications   Medication Sig Start Date End Date Taking? Authorizing Provider  acetaminophen (TYLENOL) 325 MG tablet Take 2 tablets (650 mg total) by mouth every 6 (six) hours as needed for mild pain, fever or headache (or Fever >/= 101). 12/29/17  Yes [name], MD  amLODipine (NORVASC) 5 MG tablet TAKE 1 TABLET BY MOUTH EVERY DAY Patient taking differently: Take 5 mg  by mouth daily. 07/09/20  Yes [name], MD  aspirin EC 81 MG tablet Take 81 mg by mouth at bedtime.   Yes [provider]  atorvastatin (LIPITOR) 40 MG tablet Take 1 tablet (40 mg total) by mouth daily. 08/29/19  Yes [name], MD  cyclobenzaprine (FLEXERIL) 10 MG tablet Take 1 tablet (10 mg total) by mouth 3 (three) times daily as needed for muscle spasms. 07/30/18  Yes [name], MD  DESCOVY 200-25 MG tablet Take 1 tablet by mouth at bedtime. 11/30/19  Yes Michel Bickers, MD  Dulaglutide (TRULICITY) 1.5 NK/5.3ZJ SOPN INJECT 1 PEN SUBCUTANEOUSLY ONCE A WEEK ON SUNDAY Patient taking differently: Inject 1.5 mg into the skin once a week. Take on Sundays 12/14/19  Yes [name], MD  ferrous gluconate (FERGON) 324 MG tablet Take 1 tablet (324 mg total) by mouth daily with breakfast. 01/26/18  Yes Dhungel, Nishant, MD  hydrochlorothiazide (HYDRODIURIL) 25 MG tablet TAKE 1 TABLET BY MOUTH EVERY DAY Patient taking differently: Take 25 mg by mouth daily. 04/11/20  Yes [name], MD  Incontinence Supplies KIT Use as needed for incontinence 04/16/18  Yes [name], MD  JARDIANCE 10 MG TABS tablet TAKE 1 TABLET BY MOUTH EVERY DAY Patient taking differently: Take 10 mg by mouth daily. 07/09/20  Yes [name], MD  LANTUS SOLOSTAR 100 UNIT/ML Solostar Pen Inject 55 Units into the skin at bedtime. 07/27/19  Yes [name], MD  metoprolol tartrate (LOPRESSOR) 100 MG tablet Take 1 tablet (100 mg total) by mouth 2 (two) times daily. 12/05/19  Yes [name], MD  sertraline (ZOLOFT) 100 MG tablet TAKE 1 TABLET BY MOUTH TWICE A DAY Patient taking differently: Take 100 mg by mouth daily. 06/24/20  Yes [name], MD  TIVICAY 50 MG tablet Take 1 tablet (50 mg total) by mouth daily. 11/30/19  Yes Michel Bickers, MD  acetic acid 2 % otic solution Place 6 drops into the right ear every 3 (three) hours. Patient not taking: No sig reported 12/01/19   [name], MD   amoxicillin (AMOXIL) 875 MG tablet Take 1 tablet (875 mg total) by mouth 2 (two) times daily. Patient not taking: No sig reported 11/08/19   [name], MD  collagenase (SANTYL) ointment Apply 1 application topically daily. Patient not taking: No sig reported 05/12/18   [name], MD  fenofibrate (TRICOR) 48 MG tablet Take 1 tablet by mouth once daily Patient not taking: Reported  on 08/01/2020 04/11/20   [name], MD  fenofibrate micronized (ANTARA) 43 MG capsule TAKE 1 CAPSULE BY MOUTH ONCE DAILY BEFORE BREAKFAST Patient not taking: No sig reported 04/11/20   [name], MD    Total critical care time: 35 minutes  Performed by: [place] care time was exclusive of separately billable procedures and treating other patients.   Critical care was necessary to treat or prevent imminent or life-threatening deterioration.   Critical care was time spent personally by me on the following activities: development of treatment plan with patient and/or surrogate as well as nursing, discussions with consultants, evaluation of patient's response to treatment, examination of patient, obtaining history from patient or surrogate, ordering and performing treatments and interventions, ordering and review of laboratory studies, ordering and review of radiographic studies, pulse oximetry and re-evaluation of patient's condition.   Jacky Kindle MD [facility] Pager: [phone] Mobile: [phone]

## 2020-08-03 NOTE — Transfer of Care (Signed)
Immediate Anesthesia Transfer of Care Note  Patient: Francisco Hutchinson  Procedure(s) Performed: Re-exploration and debridement of penis and groins (N/A )  Patient Location: PACU  Anesthesia Type:General  Level of Consciousness: awake, oriented and patient cooperative  Airway & Oxygen Therapy: Patient Spontanous Breathing and Patient connected to nasal cannula oxygen  Post-op Assessment: Report given to RN, Post -op Vital signs reviewed and stable and Patient moving all extremities X 4  Post vital signs: Reviewed and stable  Last Vitals:  Vitals Value Taken Time  BP 114/82 08/03/20 1401  Temp 36.4 C 08/03/20 1400  Pulse 101 08/03/20 1411  Resp 22 08/03/20 1411  SpO2 94 % 08/03/20 1411  Vitals shown include unvalidated device data.  Last Pain:  Vitals:   08/03/20 1400  TempSrc:   PainSc: 0-No pain      Patients Stated Pain Goal: 3 (02/77/41 2878)  Complications: No complications documented.

## 2020-08-03 NOTE — Anesthesia Procedure Notes (Signed)
Procedure Name: Intubation Date/Time: 08/03/2020 1:05 PM Performed by: Claris Che, CRNA Pre-anesthesia Checklist: Patient identified, Emergency Drugs available, Suction available, Patient being monitored and Timeout performed Patient Re-evaluated:Patient Re-evaluated prior to induction Oxygen Delivery Method: Circle system utilized Preoxygenation: Pre-oxygenation with 100% oxygen Induction Type: IV induction and Cricoid Pressure applied Ventilation: Mask ventilation without difficulty Laryngoscope Size: Mac and 4 Grade View: Grade III Tube type: Oral Tube size: 7.5 mm Number of attempts: 1 Airway Equipment and Method: Stylet Placement Confirmation: ETT inserted through vocal cords under direct vision,  positive ETCO2 and breath sounds checked- equal and bilateral Secured at: 24 cm Tube secured with: Tape Dental Injury: Teeth and Oropharynx as per pre-operative assessment

## 2020-08-03 NOTE — Op Note (Signed)
Operative Note  Preoperative diagnosis:  1.  Necrotizing fasciitis of penis and perineum  Postoperative diagnosis: 1.  Necrotizing fasciitis of penis and perineum  Procedure(s): 1.  Washout and debridement of penile and perineal abscess  Surgeon: Simone Curia, MD  Assistants:  None  Anesthesia:  General  Complications:  None  EBL:  25 mL  Specimens: 1. none  Drains/Catheters: 1.  Existing foley catheter  Intraoperative findings:   1. Perineal abscess communicating to suprapubic and pericorporal abscess cavity 2. Obliterated penile and bulbar urethra with Foley seen in abscess cavity  Indication:  Francisco Hutchinson is a 49 y.o. male with necrotizing soft tissue infection of the penis and perineum.  He initially went to the operating room for debridement on 08/01/2020 and returns for a second look today.  Description of procedure:  After risks and benefits of the procedure were discussed with patient, informed consent was obtained.  He was then taken to the operating room and anesthesia was induced.  Was was repositioned in the dorsal lithotomy position.  The existing packing was then removed from the abscess cavity.  He was prepped and draped in the usual sterile fashion and the Foley catheter was prepped into the field.  Blunt dissection was used to reexamine the abscess cavity which communicated to the perineum.  On physical exam patient had perineal induration and erythema concerning for active infection.  As dissection continued the corporal bodies were freed from the surrounding tissue and the urethra was noted to be obliterated.  The Foley catheter was seen in the abscess cavity with no viable tissue surrounding it.  Large amount of pus was seen emanating from the perineal cavity.  Irrigation continued until no further purulent drainage was seen.    The pulse irrigation was then used to extensively washout the perineum and suprapubic abscess cavity.  The scrotum appeared to  be uninvolved.The perineal cavity was then packed with a saline soaked Kerlix communicating to the suprapubic area.  The Foley catheter was left in place.  An ABD was applied.  The patient emerged from anesthesia and was transferred to the PACU in stable condition.    Plan:  Given today's findings, the patient will need a suprapubic tube placed.  He will also likely need another washout in 2 days to ensure all infection has been drained.

## 2020-08-03 NOTE — Anesthesia Postprocedure Evaluation (Signed)
Anesthesia Post Note  Patient: Francisco Hutchinson  Procedure(s) Performed: Re-exploration and debridement of penis and groins (N/A )     Patient location during evaluation: PACU Anesthesia Type: General Level of consciousness: awake and alert Pain management: pain level controlled Vital Signs Assessment: post-procedure vital signs reviewed and stable Respiratory status: spontaneous breathing, nonlabored ventilation, respiratory function stable and patient connected to nasal cannula oxygen Cardiovascular status: blood pressure returned to baseline and stable Postop Assessment: no apparent nausea or vomiting Anesthetic complications: no   No complications documented.  Last Vitals:  Vitals:   08/03/20 1442 08/03/20 1447  BP:  110/83  Pulse: (!) 107 (!) 115  Resp: (!) 25 20  Temp:  (!) 36.2 C  SpO2: 95% 95%    Last Pain:  Vitals:   08/03/20 1439  TempSrc:   PainSc: 3                  Hedy Garro P Dannie Woolen

## 2020-08-04 ENCOUNTER — Inpatient Hospital Stay (HOSPITAL_COMMUNITY): Payer: Medicare Other

## 2020-08-04 ENCOUNTER — Other Ambulatory Visit: Payer: Self-pay

## 2020-08-04 ENCOUNTER — Encounter (HOSPITAL_COMMUNITY): Payer: Self-pay | Admitting: Urology

## 2020-08-04 DIAGNOSIS — E871 Hypo-osmolality and hyponatremia: Secondary | ICD-10-CM | POA: Diagnosis not present

## 2020-08-04 DIAGNOSIS — N493 Fournier gangrene: Secondary | ICD-10-CM | POA: Diagnosis not present

## 2020-08-04 DIAGNOSIS — I4891 Unspecified atrial fibrillation: Secondary | ICD-10-CM

## 2020-08-04 DIAGNOSIS — N1831 Chronic kidney disease, stage 3a: Secondary | ICD-10-CM

## 2020-08-04 LAB — BASIC METABOLIC PANEL
Anion gap: 8 (ref 5–15)
BUN: 29 mg/dL — ABNORMAL HIGH (ref 6–20)
CO2: 22 mmol/L (ref 22–32)
Calcium: 7.3 mg/dL — ABNORMAL LOW (ref 8.9–10.3)
Chloride: 101 mmol/L (ref 98–111)
Creatinine, Ser: 1.84 mg/dL — ABNORMAL HIGH (ref 0.61–1.24)
GFR, Estimated: 45 mL/min — ABNORMAL LOW (ref >60)
Glucose, Bld: 206 mg/dL — ABNORMAL HIGH (ref 70–99)
Potassium: 4.2 mmol/L (ref 3.5–5.1)
Sodium: 131 mmol/L — ABNORMAL LOW (ref 135–145)

## 2020-08-04 LAB — CBC
HCT: 20.1 % — ABNORMAL LOW (ref 39.0–52.0)
Hemoglobin: 6.7 g/dL — CL (ref 13.0–17.0)
MCH: 28.2 pg (ref 26.0–34.0)
MCHC: 33.3 g/dL (ref 30.0–36.0)
MCV: 84.5 fL (ref 80.0–100.0)
Platelets: 342 10*3/uL (ref 150–400)
RBC: 2.38 MIL/uL — ABNORMAL LOW (ref 4.22–5.81)
RDW: 17.2 % — ABNORMAL HIGH (ref 11.5–15.5)
WBC: 21.5 10*3/uL — ABNORMAL HIGH (ref 4.0–10.5)
nRBC: 0 % (ref 0.0–0.2)

## 2020-08-04 LAB — GLUCOSE, CAPILLARY
Glucose-Capillary: 176 mg/dL — ABNORMAL HIGH (ref 70–99)
Glucose-Capillary: 166 mg/dL — ABNORMAL HIGH (ref 70–99)
Glucose-Capillary: 258 mg/dL — ABNORMAL HIGH (ref 70–99)
Glucose-Capillary: 175 mg/dL — ABNORMAL HIGH (ref 70–99)
Glucose-Capillary: 126 mg/dL — ABNORMAL HIGH (ref 70–99)

## 2020-08-04 LAB — PREPARE RBC (CROSSMATCH)

## 2020-08-04 LAB — HEMOGLOBIN A1C
Hgb A1c MFr Bld: 8.8 % — ABNORMAL HIGH (ref 4.8–5.6)
Mean Plasma Glucose: 205.86 mg/dL

## 2020-08-04 LAB — ECHOCARDIOGRAM COMPLETE
Height: 68 in
S' Lateral: 4.3 cm
Weight: 3142.88 oz

## 2020-08-04 LAB — MAGNESIUM: Magnesium: 2.4 mg/dL (ref 1.7–2.4)

## 2020-08-04 LAB — PHOSPHORUS: Phosphorus: 3.6 mg/dL (ref 2.5–4.6)

## 2020-08-04 MED ORDER — SODIUM CHLORIDE 0.9% IV SOLUTION: Freq: Once | INTRAVENOUS | Status: DC

## 2020-08-04 MED ORDER — INSULIN GLARGINE 100 UNIT/ML ~~LOC~~ SOLN
15.0000 [IU] | Freq: Every day | SUBCUTANEOUS | Status: DC
  Administered 2020-08-04 – 2020-08-15 (×12): 15 [IU] via SUBCUTANEOUS
  Filled 2020-08-04 (×13): qty 0.15

## 2020-08-04 MED ORDER — INSULIN ASPART 100 UNIT/ML ~~LOC~~ SOLN
0.0000 [IU] | Freq: Every day | SUBCUTANEOUS | Status: DC
  Administered 2020-08-05: 22:00:00 2 [IU] via SUBCUTANEOUS

## 2020-08-04 MED ORDER — DOLUTEGRAVIR SODIUM 50 MG PO TABS
50.0000 mg | ORAL_TABLET | Freq: Every day | ORAL | Status: DC
Start: 2020-08-04 — End: 2020-08-16
  Administered 2020-08-04 – 2020-08-15 (×12): 50 mg via ORAL
  Filled 2020-08-04 (×12): qty 1

## 2020-08-04 MED ORDER — METOPROLOL TARTRATE 100 MG PO TABS
100.0000 mg | ORAL_TABLET | Freq: Two times a day (BID) | ORAL | Status: DC
  Administered 2020-08-04 – 2020-08-15 (×24): 100 mg via ORAL
  Filled 2020-08-04 (×5): qty 1
  Filled 2020-08-04: qty 4
  Filled 2020-08-04 (×18): qty 1

## 2020-08-04 MED ORDER — INSULIN ASPART 100 UNIT/ML ~~LOC~~ SOLN
0.0000 [IU] | Freq: Three times a day (TID) | SUBCUTANEOUS | Status: DC
  Administered 2020-08-04: 17:00:00 3 [IU] via SUBCUTANEOUS
  Administered 2020-08-04: 12:00:00 8 [IU] via SUBCUTANEOUS
  Administered 2020-08-05: 18:00:00 3 [IU] via SUBCUTANEOUS
  Administered 2020-08-05: 12:00:00 2 [IU] via SUBCUTANEOUS
  Administered 2020-08-06: 13:00:00 3 [IU] via SUBCUTANEOUS
  Administered 2020-08-06: 09:00:00 5 [IU] via SUBCUTANEOUS
  Administered 2020-08-07: 12:00:00 3 [IU] via SUBCUTANEOUS
  Administered 2020-08-07: 09:00:00 5 [IU] via SUBCUTANEOUS

## 2020-08-04 MED ORDER — HYDROMORPHONE HCL 2 MG PO TABS
1.0000 mg | ORAL_TABLET | Freq: Four times a day (QID) | ORAL | Status: DC | PRN
  Administered 2020-08-04 – 2020-08-06 (×2): 1 mg via ORAL
  Filled 2020-08-04 (×2): qty 1

## 2020-08-04 MED ORDER — DILTIAZEM HCL 30 MG PO TABS: 60.0000 mg | ORAL_TABLET | Freq: Four times a day (QID) | ORAL | Status: DC

## 2020-08-04 MED ORDER — EMTRICITABINE-TENOFOVIR AF 200-25 MG PO TABS
1.0000 | ORAL_TABLET | Freq: Every day | ORAL | Status: DC
  Administered 2020-08-04 – 2020-08-15 (×12): 1 via ORAL
  Filled 2020-08-04 (×13): qty 1

## 2020-08-04 NOTE — Progress Notes (Signed)
eLink Physician-Brief Progress Note Patient Name: Francisco Hutchinson DOB: [birth year] MRN: [id number]   Date of Service  08/04/2020  HPI/Events of Note  Hemoglobin 6.7  eICU Interventions  T/S and 1 unit RBC ordered RN to take consent     Intervention Category Major Interventions: Hemorrhage - evaluation and management  Margaretmary Lombard 08/04/2020, 1:41 AM

## 2020-08-04 NOTE — Progress Notes (Signed)
  Echocardiogram 2D Echocardiogram has been performed.  Francisco Hutchinson 08/04/2020, 4:55 PM

## 2020-08-04 NOTE — Progress Notes (Signed)
1 Day Post-Op Subjective: Pain controlled, no nausea or emesis. Afebrile.  Objective: Vital signs in last 24 hours: Temp:  [97.2 F (36.2 C)-98.5 F (36.9 C)] 98.1 F (36.7 C) (12/25 0748) Pulse Rate:  [88-225] 106 (12/25 0800) Resp:  [13-28] 20 (12/25 0800) BP: (85-114)/(64-85) 106/79 (12/25 0800) SpO2:  [88 %-98 %] 96 % (12/25 0800) Weight:  [89.1 kg] 89.1 kg (12/25 0500)  Intake/Output from previous day: 12/24 0701 - 12/25 0700 In: 2229.7 [P.O.:60; I.V.:1706.7; Blood:350; IV Piggyback:113] Out: 75 [Urine:75] Intake/Output this shift: Total I/O In: 180.1 [P.O.:75; I.V.:105.1] Out: -   Poorly recorded, foley draining clear yellow urine  Physical Exam:  General: Alert and oriented CV: RRR Lungs: Clear Abdomen: Soft, ND, NT GU: removed kerlex packing, no increased peripheral erythema. Obliterated urethra with foley entering at bulbar urethra. No significant purulent material at the base of the wound. Some fibrinous debris present. Repacked kerlex.  Lab Results: Recent Labs    08/01/20 1528 08/03/20 0056 08/04/20 0052  HGB 8.7* 7.0* 6.7*  HCT 28.1* 20.8* 20.1*   BMET Recent Labs    08/03/20 0056 08/04/20 0052  NA 131* 131*  K 3.2* 4.2  CL 101 101  CO2 23 22  GLUCOSE 160* 206*  BUN 28* 29*  CREATININE 1.56* 1.84*  CALCIUM 7.2* 7.3*     Studies/Results: No results found.  Assessment/Plan: 49yo M with HIV, DM, PAD with Fourniers gangrene. CT imaging with 8cm fluid collection and air at base of penis running along shaft. S/p I&D on 12/23 and 12/24.  -Premedicated with 2mg  dilaudid and exchanged kerlex dressing with fresh normal saline wet to dry dressing. No obvious purulent tissue on limited bedside exam. Some minor fibrinous debris at the base of the wound. No increased erythema. -Added on to OR tomorrow for wound irrigation and dressing exchange -Keep foley to gravity -IR has been contacted to place SPT -Considering [facility] GU  reconstruction given complexity of this case with now obilterated penile urethra -Continue abx -NPO after midnight   LOS: 2 days   Matt R. Jona Zappone MD 08/04/2020, 10:58 AM [facility]  Pager: [phone]

## 2020-08-04 NOTE — Progress Notes (Signed)
NAME:  Francisco Hutchinson, MRN:  [id number], DOB:  11-25-1970, LOS: 2 ADMISSION DATE:  08/01/2020,   Brief History:  49 year old male presented to the emergency room in septic shock due to Fournier's gangrene, urology consulted and was taken to the OR for I&D  Past Medical History:  Hypertension HIV CVA Diabetes mellitus Anxiety Depression  Consults:  Urology  Procedures:  12/22 I&D of penile abscess with further exploration of the suprapubic area. 12/24 washout and debridement of penile and perineal abscesses   Micro Data:  Influenza negative Covid negative 12/23 tissue culture >> Proteus MRSA PCR negative 12/23 blood culture >> negative  Antimicrobials:  Cefepime 12/22 Clindamycin 12/22 Vancomycin 12/22 >> 12/25 Zosyn 12/23 >>  Interim history/subjective:  Underwent washout and debridement of penile and perineal abscess yesterday in the OR by urology.  Plan to go back to the OR tomorrow  Objective   Blood pressure 109/79, pulse (!) 103, temperature (!) 97.4 F (36.3 C), temperature source Oral, resp. rate (!) 29, height $RemoveBe'5\' 8"'[id number]$  (1.727 m), weight 89.1 kg, SpO2 93 %.        Intake/Output Summary (Last 24 hours) at 08/04/2020 1229 Last data filed at 08/04/2020 1100 Gross per 24 hour  Intake 2566.74 ml  Output 365 ml  Net 2201.74 ml   Filed Weights   08/02/20 1600 08/03/20 0500 08/04/20 0500  Weight: 110.2 kg 89.3 kg 89.1 kg    Examination: General: Middle-age Caucasian male, lying on the bed HENT: Atraumatic/normocephalic, mucous membranes are moist Lungs: Clear to auscultation bilaterally, no wheezing rales or rhonchi Cardiovascular: Regular rate and rhythm, mild systolic ejection murmur Abdomen: Soft, nontender, nondistended, positive bowel sounds, no rebound/rigidity/guarding. Extremities: Distal pulse intact x3.  Right BKA intact and clean.  No significant edema.  Surgical dressing is intact and was not removed at the request of surgery  service. Neuro: Conscious alert and oriented x3.  Upper extremity generalized motor weakness though at baseline.  Lower extremity weakness at baseline. GU: Surgical dressing intact.   Assessment & Plan:  Critically ill with Fournier's gangrene  Severe sepsis (POA), improved Status post I&D of the base of the penis for abscess. HIV disease, controlled Diabetes mellitus type 2 Hyponatremia CKD stage IIIa  Patient underwent washout and debridement of penile and perineal abscess by urology yesterday Plan to go back to the OR tomorrow for wound irrigation and dressing changes Keep n.p.o. past midnight He is afebrile now her white count is improving Wound culture grew Proteus Discontinue vancomycin and continue Zosyn DC IV fluid, continue diabetic diet Restarted antiretrovirals medications restarted Monitor fingerstick with goal 140-180 Patient serum sodium remain around 131, now he is off D5W which will help to improve his sodium Monitor serum creatinine  Best practice (evaluated daily)  Diet: Diabetic diet, n.p.o. past midnight for OR tomorrow Pain/Anxiety/Delirium protocol (if indicated): As needed Dilaudid VAP protocol (if indicated): N/A DVT prophylaxis: Lovenox  GI prophylaxis: Protonix Glucose control: Insulin sliding scale Mobility: As tolerated Disposition: Transfer to PCU  Goals of Care:  Code Status: Full  Labs   CBC: Recent Labs  Lab 08/01/20 1528 08/03/20 0056 08/04/20 0052  WBC 27.5* 21.0* 21.5*  NEUTROABS 25.6*  --   --   HGB 8.7* 7.0* 6.7*  HCT 28.1* 20.8* 20.1*  MCV 88.1 82.5 84.5  PLT 449* 347 767    Basic Metabolic Panel: Recent Labs  Lab 08/01/20 1528 08/02/20 0609 08/03/20 0056 08/04/20 0052  NA 131* 132* 131* 131*  K 3.1* 3.1* 3.2*  4.2  CL 96* 101 101 101  CO2 $Re'24 22 23 22  'UZf$ GLUCOSE 143* 95 160* 206*  BUN 30* 28* 28* 29*  CREATININE 1.51* 1.37* 1.56* 1.84*  CALCIUM 7.7* 7.2* 7.2* 7.3*  MG  --  1.8 1.9 2.4  PHOS  --  2.4* 3.6 3.6    GFR: Estimated Creatinine Clearance: 53.8 mL/min (A) (by C-G formula based on SCr of 1.84 mg/dL (H)). Recent Labs  Lab 08/01/20 1528 08/01/20 1530 08/01/20 2033 08/02/20 0609 08/02/20 0612 08/02/20 1047 08/03/20 0056 08/04/20 0052  PROCALCITON  --   --   --  0.40  --   --   --   --   WBC 27.5*  --   --   --   --   --  21.0* 21.5*  LATICACIDVEN  --  2.3* 1.2  --  0.8 0.9  --   --     Liver Function Tests: Recent Labs  Lab 08/01/20 1528 08/02/20 0609 08/03/20 0056  AST $Re'17 17 16  'hrL$ ALT $R'12 11 12  'Yy$ ALKPHOS 103 73 74  BILITOT 0.8 0.7 0.9  PROT 6.2* 5.5* 5.2*  ALBUMIN 1.7* 1.7* 1.5*   No results for input(s): LIPASE, AMYLASE in the last 168 hours. No results for input(s): AMMONIA in the last 168 hours.  ABG No results found for: PHART, PCO2ART, PO2ART, HCO3, TCO2, ACIDBASEDEF, O2SAT   Coagulation Profile: Recent Labs  Lab 08/02/20 1047  INR 1.5*    Cardiac Enzymes: No results for input(s): CKTOTAL, CKMB, CKMBINDEX, TROPONINI in the last 168 hours.  HbA1C: Hemoglobin A1C  Date/Time Value Ref Range Status  01/20/2019 12:00 AM 9.7  Final  09/27/2018 03:05 PM 8.9 (A) 4.0 - 5.6 % Final   Hgb A1c MFr Bld  Date/Time Value Ref Range Status  08/04/2020 12:32 AM 8.8 (H) 4.8 - 5.6 % Final    Comment:    (NOTE) Pre diabetes:          5.7%-6.4%  Diabetes:              >6.4%  Glycemic control for   <7.0% adults with diabetes   06/03/2018 12:00 AM 9.2 (A) 4.0 - 6.0 Final    CBG: Recent Labs  Lab 08/03/20 1949 08/03/20 2343 08/04/20 0352 08/04/20 0739 08/04/20 1121  GLUCAP 194* 208* 176* 166* 258*     Past Medical History:  He,  has a past medical history of Allergy, Anxiety and depression (05/29/2017), Diabetes mellitus without complication ([place]), HIV (human immunodeficiency virus infection) ([place]), Hypertension, and Stroke ([place]).   Surgical History:   Past Surgical History:  Procedure Laterality Date  . ABDOMINAL AORTOGRAM W/LOWER EXTREMITY N/A  01/20/2018   Procedure: ABDOMINAL AORTOGRAM W/LOWER EXTREMITY;  Surgeon: Waynetta Sandy, MD;  Location: [facility];  Service: Cardiovascular;  Laterality: N/A;  . abscess removed from back and left armpit    . AMPUTATION Right 01/22/2018   Procedure: RIGHT BELOW KNEE AMPUTATION;  Surgeon: [name], MD;  Location: [facility];  Service: Orthopedics;  Laterality: Right;  . CYSTOSCOPY WITH STENT PLACEMENT N/A 08/01/2020   Procedure: CYSTOSCOPY;  Surgeon: Bjorn Loser, MD;  Location: [facility];  Service: Urology;  Laterality: N/A;  . IRRIGATION AND DEBRIDEMENT BUTTOCKS Left 03/23/2017   Procedure: IRRIGATION AND DEBRIDEMENT LEFT BUTTOCK ABSCESS;  Surgeon: Excell Seltzer, MD;  Location: [facility];  Service: General;  Laterality: Left;  . IRRIGATION AND DEBRIDEMENT BUTTOCKS N/A 03/25/2017   Procedure: IRRIGATION AND DEBRIDEMENT DRESSING CHANGE AND EXPLORATION  OF PERINEAL WOUND; CYSTOSCOPY;  Surgeon: Excell Seltzer, MD;  Location: [facility];  Service: General;  Laterality: N/A;  . PARS PLANA VITRECTOMY 27 GAUGE WITH 25 GAUGE PORT Left 08/31/2017  . Surgery Right Arm    . WOUND EXPLORATION Bilateral 08/01/2020   Procedure: EXPLORATION AND DEBRIDEMENT GROINS;  Surgeon: Bjorn Loser, MD;  Location: [facility];  Service: Urology;  Laterality: Bilateral;  . WOUND EXPLORATION N/A 08/03/2020   Procedure: Re-exploration and debridement of penis and groins;  Surgeon: Robley Fries, MD;  Location: [facility];  Service: Urology;  Laterality: N/A;     Social History:   reports that he has never smoked. He has never used smokeless tobacco. He reports current alcohol use of about 2.0 standard drinks of alcohol per week. He reports that he does not use drugs.   Family History:  His family history includes Alcohol abuse in his father and sister; Arthritis in his mother; Cancer in his father, maternal grandfather, and paternal grandmother; Diabetes in his father and paternal grandmother; Early death  in his father; Heart attack in his maternal grandmother; Heart disease in his maternal grandmother; Hyperlipidemia in his father; Hypertension in his father, maternal grandmother, and mother.   Allergies No Known Allergies   Home Medications  Prior to Admission medications   Medication Sig Start Date End Date Taking? Authorizing Provider  acetaminophen (TYLENOL) 325 MG tablet Take 2 tablets (650 mg total) by mouth every 6 (six) hours as needed for mild pain, fever or headache (or Fever >/= 101). 12/29/17  Yes [name], MD  amLODipine (NORVASC) 5 MG tablet TAKE 1 TABLET BY MOUTH EVERY DAY Patient taking differently: Take 5 mg by mouth daily. 07/09/20  Yes [name], MD  aspirin EC 81 MG tablet Take 81 mg by mouth at bedtime.   Yes [provider]  atorvastatin (LIPITOR) 40 MG tablet Take 1 tablet (40 mg total) by mouth daily. 08/29/19  Yes [name], MD  cyclobenzaprine (FLEXERIL) 10 MG tablet Take 1 tablet (10 mg total) by mouth 3 (three) times daily as needed for muscle spasms. 07/30/18  Yes [name], MD  DESCOVY 200-25 MG tablet Take 1 tablet by mouth at bedtime. 11/30/19  Yes Michel Bickers, MD  Dulaglutide (TRULICITY) 1.5 TK/2.4OX SOPN INJECT 1 PEN SUBCUTANEOUSLY ONCE A WEEK ON SUNDAY Patient taking differently: Inject 1.5 mg into the skin once a week. Take on Sundays 12/14/19  Yes [name], MD  ferrous gluconate (FERGON) 324 MG tablet Take 1 tablet (324 mg total) by mouth daily with breakfast. 01/26/18  Yes Dhungel, Nishant, MD  hydrochlorothiazide (HYDRODIURIL) 25 MG tablet TAKE 1 TABLET BY MOUTH EVERY DAY Patient taking differently: Take 25 mg by mouth daily. 04/11/20  Yes [name], MD  Incontinence Supplies KIT Use as needed for incontinence 04/16/18  Yes [name], MD  JARDIANCE 10 MG TABS tablet TAKE 1 TABLET BY MOUTH EVERY DAY Patient taking differently: Take 10 mg by mouth daily. 07/09/20  Yes [name], MD  LANTUS SOLOSTAR 100  UNIT/ML Solostar Pen Inject 55 Units into the skin at bedtime. 07/27/19  Yes [name], MD  metoprolol tartrate (LOPRESSOR) 100 MG tablet Take 1 tablet (100 mg total) by mouth 2 (two) times daily. 12/05/19  Yes [name], MD  sertraline (ZOLOFT) 100 MG tablet TAKE 1 TABLET BY MOUTH TWICE A DAY Patient taking differently: Take 100 mg by mouth daily. 06/24/20  Yes Dimas Chyle  M, MD  TIVICAY 50 MG tablet Take 1 tablet (50 mg total) by mouth daily. 11/30/19  Yes Michel Bickers, MD  acetic acid 2 % otic solution Place 6 drops into the right ear every 3 (three) hours. Patient not taking: No sig reported 12/01/19   [name], MD  amoxicillin (AMOXIL) 875 MG tablet Take 1 tablet (875 mg total) by mouth 2 (two) times daily. Patient not taking: No sig reported 11/08/19   [name], MD  collagenase (SANTYL) ointment Apply 1 application topically daily. Patient not taking: No sig reported 05/12/18   [name], MD  fenofibrate (TRICOR) 48 MG tablet Take 1 tablet by mouth once daily Patient not taking: Reported on 08/01/2020 04/11/20   [name], MD  fenofibrate micronized (ANTARA) 43 MG capsule TAKE 1 CAPSULE BY MOUTH ONCE DAILY BEFORE BREAKFAST Patient not taking: No sig reported 04/11/20   [name], MD      Jacky Kindle MD [facility] Pager: [phone] Mobile: [phone]

## 2020-08-04 NOTE — Plan of Care (Signed)
  Problem: Clinical Measurements: Goal: Respiratory complications will improve Outcome: Progressing   Problem: Clinical Measurements: Goal: Cardiovascular complication will be avoided Outcome: Progressing   Problem: Elimination: Goal: Will not experience complications related to bowel motility Outcome: Progressing   Problem: Elimination: Goal: Will not experience complications related to urinary retention Outcome: Progressing   Problem: Pain Managment: Goal: General experience of comfort will improve Outcome: Progressing    Problem: Skin Integrity: Goal: Risk for impaired skin integrity will decrease Outcome: Progressing

## 2020-08-05 ENCOUNTER — Inpatient Hospital Stay (HOSPITAL_COMMUNITY): Payer: Medicare Other | Admitting: Certified Registered Nurse Anesthetist

## 2020-08-05 ENCOUNTER — Encounter (HOSPITAL_COMMUNITY)
Admission: EM | Disposition: A | Discharge status: Other Acute Inpatient Hospital | Payer: Self-pay | Source: Home / Self Care | Attending: Internal Medicine

## 2020-08-05 DIAGNOSIS — N4821 Abscess of corpus cavernosum and penis: Secondary | ICD-10-CM

## 2020-08-05 HISTORY — PX: IRRIGATION AND DEBRIDEMENT ABSCESS: SHX5252

## 2020-08-05 LAB — TYPE AND SCREEN
ABO/RH(D): A POS
Antibody Screen: NEGATIVE
Unit division: 0

## 2020-08-05 LAB — BPAM RBC
Blood Product Expiration Date: 202112312359
ISSUE DATE / TIME: 202112250454
Unit Type and Rh: 6200

## 2020-08-05 LAB — GLUCOSE, CAPILLARY
Glucose-Capillary: 92 mg/dL (ref 70–99)
Glucose-Capillary: 133 mg/dL — ABNORMAL HIGH (ref 70–99)
Glucose-Capillary: 185 mg/dL — ABNORMAL HIGH (ref 70–99)
Glucose-Capillary: 208 mg/dL — ABNORMAL HIGH (ref 70–99)

## 2020-08-05 LAB — SURGICAL PCR SCREEN
MRSA, PCR: NEGATIVE
Staphylococcus aureus: NEGATIVE

## 2020-08-05 SURGERY — IRRIGATION AND DEBRIDEMENT ABSCESS
Anesthesia: General | Site: Penis

## 2020-08-05 MED ORDER — PHENYLEPHRINE 40 MCG/ML (10ML) SYRINGE FOR IV PUSH (FOR BLOOD PRESSURE SUPPORT)
PREFILLED_SYRINGE | INTRAVENOUS | Status: DC | PRN
  Administered 2020-08-05 (×3): 80 ug via INTRAVENOUS

## 2020-08-05 MED ORDER — PROPOFOL 10 MG/ML IV BOLUS
INTRAVENOUS | Status: AC
  Filled 2020-08-05: qty 20

## 2020-08-05 MED ORDER — DEXAMETHASONE SODIUM PHOSPHATE 10 MG/ML IJ SOLN
INTRAMUSCULAR | Status: DC | PRN
  Administered 2020-08-05: 5 mg via INTRAVENOUS

## 2020-08-05 MED ORDER — PROPOFOL 10 MG/ML IV BOLUS
INTRAVENOUS | Status: DC | PRN
  Administered 2020-08-05: 150 mg via INTRAVENOUS

## 2020-08-05 MED ORDER — SODIUM CHLORIDE 0.9 % IR SOLN
Status: DC | PRN
  Administered 2020-08-05: 3000 mL

## 2020-08-05 MED ORDER — HYDROMORPHONE HCL 1 MG/ML IJ SOLN: 0.2500 mg | INTRAMUSCULAR | Status: DC | PRN

## 2020-08-05 MED ORDER — ONDANSETRON HCL 4 MG/2ML IJ SOLN: 4.0000 mg | Freq: Once | INTRAMUSCULAR | Status: DC | PRN

## 2020-08-05 MED ORDER — SERTRALINE HCL 100 MG PO TABS
100.0000 mg | ORAL_TABLET | Freq: Two times a day (BID) | ORAL | Status: DC
  Administered 2020-08-05 – 2020-08-15 (×21): 100 mg via ORAL
  Filled 2020-08-05 (×21): qty 1

## 2020-08-05 MED ORDER — LACTATED RINGERS IV SOLN: INTRAVENOUS | Status: DC

## 2020-08-05 MED ORDER — SODIUM CHLORIDE 0.9 % IV SOLN: INTRAVENOUS | Status: DC | PRN

## 2020-08-05 MED ORDER — FENTANYL CITRATE (PF) 250 MCG/5ML IJ SOLN
INTRAMUSCULAR | Status: DC | PRN
  Administered 2020-08-05 (×2): 25 ug via INTRAVENOUS

## 2020-08-05 MED ORDER — FENTANYL CITRATE (PF) 250 MCG/5ML IJ SOLN
INTRAMUSCULAR | Status: AC
  Filled 2020-08-05: qty 5

## 2020-08-05 MED ORDER — MIDAZOLAM HCL 2 MG/2ML IJ SOLN
INTRAMUSCULAR | Status: DC | PRN
  Administered 2020-08-05: 2 mg via INTRAVENOUS

## 2020-08-05 MED ORDER — CHLORHEXIDINE GLUCONATE 0.12 % MT SOLN
15.0000 mL | Freq: Once | OROMUCOSAL | Status: AC
  Administered 2020-08-05: 07:00:00 15 mL via OROMUCOSAL
  Filled 2020-08-05: qty 15

## 2020-08-05 MED ORDER — MIDAZOLAM HCL 2 MG/2ML IJ SOLN
INTRAMUSCULAR | Status: AC
  Filled 2020-08-05: qty 2

## 2020-08-05 MED ORDER — 0.9 % SODIUM CHLORIDE (POUR BTL) OPTIME
TOPICAL | Status: DC | PRN
  Administered 2020-08-05: 08:00:00 1000 mL

## 2020-08-05 MED ORDER — ONDANSETRON HCL 4 MG/2ML IJ SOLN
INTRAMUSCULAR | Status: DC | PRN
  Administered 2020-08-05: 4 mg via INTRAVENOUS

## 2020-08-05 MED ORDER — LIDOCAINE 2% (20 MG/ML) 5 ML SYRINGE
INTRAMUSCULAR | Status: DC | PRN
  Administered 2020-08-05: 100 mg via INTRAVENOUS

## 2020-08-05 MED ORDER — ATORVASTATIN CALCIUM 40 MG PO TABS
40.0000 mg | ORAL_TABLET | Freq: Every day | ORAL | Status: DC
  Administered 2020-08-06 – 2020-08-15 (×10): 40 mg via ORAL
  Filled 2020-08-05 (×10): qty 1

## 2020-08-05 MED ORDER — MEPERIDINE HCL 25 MG/ML IJ SOLN
6.2500 mg | INTRAMUSCULAR | Status: DC | PRN
Start: 2020-08-05 — End: 2020-08-05

## 2020-08-05 SURGICAL SUPPLY — 43 items
BAG DECANTER FOR FLEXI CONT (MISCELLANEOUS) ×2 IMPLANT
BLADE SURG 15 STRL LF DISP TIS (BLADE) ×1 IMPLANT
BLADE SURG 15 STRL SS (BLADE) ×1
BNDG GAUZE ELAST 4 BULKY (GAUZE/BANDAGES/DRESSINGS) ×2 IMPLANT
CLEANER TIP ELECTROSURG 2X2 (MISCELLANEOUS) ×2 IMPLANT
COVER SURGICAL LIGHT HANDLE (MISCELLANEOUS) ×3 IMPLANT
COVER WAND RF STERILE (DRAPES) ×2 IMPLANT
DRAPE HALF SHEET 40X57 (DRAPES) ×1 IMPLANT
DRAPE LAPAROTOMY T 102X78X121 (DRAPES) ×1 IMPLANT
DRSG PAD ABDOMINAL 8X10 ST (GAUZE/BANDAGES/DRESSINGS) ×2 IMPLANT
ELECT CAUTERY BLADE 6.4 (BLADE) ×2 IMPLANT
ELECT REM PT RETURN 9FT ADLT (ELECTROSURGICAL) ×2
ELECTRODE REM PT RTRN 9FT ADLT (ELECTROSURGICAL) ×1 IMPLANT
GAUZE 4X4 16PLY RFD (DISPOSABLE) ×2 IMPLANT
GLOVE BIO SURGEON STRL SZ7.5 (GLOVE) ×3 IMPLANT
GLOVE BIOGEL PI IND STRL 7.5 (GLOVE) IMPLANT
GLOVE BIOGEL PI INDICATOR 7.5 (GLOVE) ×1
GOWN STRL REUS W/ TWL LRG LVL3 (GOWN DISPOSABLE) ×2 IMPLANT
GOWN STRL REUS W/TWL LRG LVL3 (GOWN DISPOSABLE) ×2
HANDPIECE INTERPULSE COAX TIP (DISPOSABLE) ×1
KIT BASIN OR (CUSTOM PROCEDURE TRAY) ×2 IMPLANT
KIT TURNOVER KIT B (KITS) ×2 IMPLANT
LEGGING LITHOTOMY PAIR STRL (DRAPES) ×1 IMPLANT
NDL HYPO 25X1 1.5 SAFETY (NEEDLE) IMPLANT
NEEDLE HYPO 25X1 1.5 SAFETY (NEEDLE) IMPLANT
NS IRRIG 1000ML POUR BTL (IV SOLUTION) ×2 IMPLANT
PACK SURGICAL SETUP 50X90 (CUSTOM PROCEDURE TRAY) ×2 IMPLANT
PAD ARMBOARD 7.5X6 YLW CONV (MISCELLANEOUS) ×4 IMPLANT
PENCIL BUTTON HOLSTER BLD 10FT (ELECTRODE) ×2 IMPLANT
SET HNDPC FAN SPRY TIP SCT (DISPOSABLE) IMPLANT
SPONGE LAP 4X18 RFD (DISPOSABLE) IMPLANT
SUT CHROMIC 3 0 PS 2 (SUTURE) IMPLANT
SUT CHROMIC 3 0 SH 27 (SUTURE) IMPLANT
SUT CHROMIC 4 0 RB 1X27 (SUTURE) IMPLANT
SUT VIC AB 2-0 SH 27 (SUTURE)
SUT VIC AB 2-0 SH 27XBRD (SUTURE) IMPLANT
SUT VIC AB 3-0 SH 27 (SUTURE)
SUT VIC AB 3-0 SH 27X BRD (SUTURE) IMPLANT
SYR CONTROL 10ML LL (SYRINGE) ×1 IMPLANT
TOWEL GREEN STERILE FF (TOWEL DISPOSABLE) ×2 IMPLANT
TUBE CONNECTING 12X1/4 (SUCTIONS) ×2 IMPLANT
WATER STERILE IRR 1000ML POUR (IV SOLUTION) ×2 IMPLANT
YANKAUER SUCT BULB TIP NO VENT (SUCTIONS) ×2 IMPLANT

## 2020-08-05 NOTE — Plan of Care (Signed)

## 2020-08-05 NOTE — H&P (View-Only) (Signed)
Day of Surgery Subjective: Pain controlled, no nausea or emesis. Afebrile.  Objective: Vital signs in last 24 hours: Temp:  [97.4 F (36.3 C)-98.3 F (36.8 C)] 98.3 F (36.8 C) (12/26 0503) Pulse Rate:  [97-110] 98 (12/26 0503) Resp:  [16-29] 16 (12/26 0503) BP: (91-112)/(74-85) 106/85 (12/26 0503) SpO2:  [91 %-96 %] 95 % (12/26 0503) Weight:  [94.5 kg] 94.5 kg (12/26 0503)  Intake/Output from previous day: 12/25 0701 - 12/26 0700 In: 733 [P.O.:315; I.V.:276.8; IV Piggyback:141.2] Out: 740 [Urine:740] Intake/Output this shift: No intake/output data recorded.  Physical Exam:  General: Alert and oriented CV: RRR Lungs: Clear Abdomen: Soft, ND, NT GU: packing in place  Lab Results: Recent Labs    08/03/20 0056 08/04/20 0052  HGB 7.0* 6.7*  HCT 20.8* 20.1*   BMET Recent Labs    08/03/20 0056 08/04/20 0052  NA 131* 131*  K 3.2* 4.2  CL 101 101  CO2 23 22  GLUCOSE 160* 206*  BUN 28* 29*  CREATININE 1.56* 1.84*  CALCIUM 7.2* 7.3*     Studies/Results: ECHOCARDIOGRAM COMPLETE  Result Date: 08/04/2020    ECHOCARDIOGRAM REPORT   Patient Name:   Francisco Hutchinson [facility] Date of Exam: 08/04/2020 Medical Rec #:  [id number]           Height:       68.0 in Accession #:    [id number]          Weight:       196.4 lb Date of Birth:  02-13-1971           BSA:          2.028 m Patient Age:    72 years            BP:           98/74 mmHg Patient Gender: M                   HR:           87 bpm. Exam Location:  Inpatient Procedure: 2D Echo Indications:    atrial fibrillation  History:        Patient has no prior history of Echocardiogram examinations.                 Chronic kidney disease. HIV., Arrythmias:Atrial Fibrillation;                 Risk Factors:Dyslipidemia and Diabetes.  Sonographer:    Johny Chess Referring PhysZA:[id number] [place]  1. Left ventricular ejection fraction, by estimation, is 40 to 45%. The left ventricle has mildly decreased function. The  left ventricle demonstrates global hypokinesis. Left ventricular diastolic function could not be evaluated.  2. Right ventricular systolic function is mildly reduced. The right ventricular size is mildly enlarged. There is mildly elevated pulmonary artery systolic pressure. The estimated right ventricular systolic pressure is 123XX123 mmHg.  3. Left atrial size was mildly dilated.  4. The mitral valve is grossly normal. Trivial mitral valve regurgitation. No evidence of mitral stenosis.  5. The aortic valve is tricuspid. Aortic valve regurgitation is not visualized. No aortic stenosis is present.  6. The inferior vena cava is dilated in size with <50% respiratory variability, suggesting right atrial pressure of 15 mmHg. FINDINGS  Left Ventricle: Left ventricular ejection fraction, by estimation, is 40 to 45%. The left ventricle has mildly decreased function. The left ventricle demonstrates global hypokinesis. The left ventricular internal cavity size was normal in  size. There is  no left ventricular hypertrophy. Left ventricular diastolic function could not be evaluated due to atrial fibrillation. Left ventricular diastolic function could not be evaluated. Right Ventricle: The right ventricular size is mildly enlarged. No increase in right ventricular wall thickness. Right ventricular systolic function is mildly reduced. There is mildly elevated pulmonary artery systolic pressure. The tricuspid regurgitant  velocity is 2.62 m/s, and with an assumed right atrial pressure of 15 mmHg, the estimated right ventricular systolic pressure is 123XX123 mmHg. Left Atrium: Left atrial size was mildly dilated. Right Atrium: Right atrial size was normal in size. Pericardium: Trivial pericardial effusion is present. Mitral Valve: The mitral valve is grossly normal. Trivial mitral valve regurgitation. No evidence of mitral valve stenosis. Tricuspid Valve: The tricuspid valve is grossly normal. Tricuspid valve regurgitation is mild . No  evidence of tricuspid stenosis. Aortic Valve: The aortic valve is tricuspid. Aortic valve regurgitation is not visualized. No aortic stenosis is present. Pulmonic Valve: The pulmonic valve was grossly normal. Pulmonic valve regurgitation is trivial. No evidence of pulmonic stenosis. Aorta: The aortic root and ascending aorta are structurally normal, with no evidence of dilitation. Venous: The inferior vena cava is dilated in size with less than 50% respiratory variability, suggesting right atrial pressure of 15 mmHg. IAS/Shunts: The atrial septum is grossly normal.  LEFT VENTRICLE PLAX 2D LVIDd:         5.10 cm LVIDs:         4.30 cm LV PW:         1.10 cm LV IVS:        0.90 cm LVOT diam:     2.30 cm LV SV:         37 LV SV Index:   18 LVOT Area:     4.15 cm  RIGHT VENTRICLE            IVC RV S prime:     9.46 cm/s  IVC diam: 2.50 cm TAPSE (M-mode): 1.5 cm LEFT ATRIUM             Index       RIGHT ATRIUM           Index LA diam:        5.10 cm 2.51 cm/m  RA Area:     17.60 cm LA Vol (A2C):   66.1 ml 32.59 ml/m RA Volume:   44.00 ml  21.69 ml/m LA Vol (A4C):   76.9 ml 37.92 ml/m LA Biplane Vol: 75.2 ml 37.08 ml/m  AORTIC VALVE LVOT Vmax:   55.50 cm/s LVOT Vmean:  35.900 cm/s LVOT VTI:    0.088 m  AORTA Ao Root diam: 3.50 cm Ao Asc diam:  3.60 cm TRICUSPID VALVE TR Peak grad:   27.5 mmHg TR Vmax:        262.00 cm/s  SHUNTS Systemic VTI:  0.09 m Systemic Diam: 2.30 cm [name] Electronically signed by [name] Signature Date/Time: 08/04/2020/5:22:46 PM    Final     Assessment/Plan: 49yo M with HIV, DM, PAD with Fourniers gangrene. CT imaging with 8cm fluid collection and air at base of penis running along shaft. S/p I&D on 12/23 and 12/24.  -To OR today for washout and debridement of penis and perineum   LOS: 3 days   Matt R. Meron Bocchino MD 08/05/2020, 7:14 AM [facility]  Pager: [phone]

## 2020-08-05 NOTE — Anesthesia Procedure Notes (Signed)
Procedure Name: LMA Insertion Date/Time: 08/05/2020 7:39 AM Performed by: Alain Marion, CRNA Pre-anesthesia Checklist: Patient identified, Emergency Drugs available, Suction available and Patient being monitored Patient Re-evaluated:Patient Re-evaluated prior to induction Oxygen Delivery Method: Circle System Utilized Preoxygenation: Pre-oxygenation with 100% oxygen Induction Type: IV induction Ventilation: Mask ventilation without difficulty LMA: LMA inserted LMA Size: 4.0 Number of attempts: 1 Airway Equipment and Method: Bite block Placement Confirmation: positive ETCO2 Tube secured with: Tape Dental Injury: Teeth and Oropharynx as per pre-operative assessment

## 2020-08-05 NOTE — Progress Notes (Signed)
PROGRESS NOTE    Francisco Hutchinson  XQJ:[id number] DOB: 1971-02-20 DOA: 08/01/2020 PCP: Ardith Dark, MD   Brief Narrative: 49 year old nursing home resident admitted on 08/02/2020 by PCCM.  He was found to have Fournier's gangrene, seen by urology exploratory I&D was done and he was taken to the OR again on 08/05/2020 for debridement of penile and perineal abscess as patient had necrotizing fasciitis of penis and perineum. Wound culture growing Proteus blood culture negative MRSA PCR negative Covid negative flu negative. He has been treated with Zosyn TRH pickup 08/05/2020  Assessment & Plan:   Active Problems:   Fourniers gangrene   #1 foreign years gangrene he is status post debridement twice by urology.  Culture growing Proteus continue Zosyn.  #2  HIV antiretrovirals restarted continue.  #3 history of stroke on aspirin prior to admission  #4 history of type 2 diabetes continue Lantus  #5 history of anxiety and depression continue home meds  #6 hyponatremia sodium improving sodium 131.    #7 CKD stage III A stable creatinine 1.8 up from 1.5 yesterday monitor closely.  #8 stage II pressure injury present on admission see below  Pressure Injury 03/24/17 Stage II -  Partial thickness loss of dermis presenting as a shallow open ulcer with a red, pink wound bed without slough. (Active)  03/24/17 1131  Location: Elbow  Location Orientation: Right  Staging: Stage II -  Partial thickness loss of dermis presenting as a shallow open ulcer with a red, pink wound bed without slough.  Wound Description (Comments):   Present on Admission: Yes      Nutrition Problem: Increased nutrient needs Etiology: wound healing     Signs/Symptoms: estimated needs    Interventions: Ensure Enlive (each supplement provides 350kcal and 20 grams of protein),MVI  Estimated body mass index is 31.68 kg/m as calculated from the following:   Height as of this encounter: 5\' 8"  (1.727  m).   Weight as of this encounter: 94.5 kg.  DVT prophylaxis: Not on anticoagulation due to recent surgery Code Status: Full code Family Communication: None at bedside Disposition Plan:  Status is: Inpatient  Dispo: The patient is from: SNF              Anticipated d/c is to: SNF              Anticipated d/c date is: > 3 days              Patient currently is not medically stable to d/c.  Consultants: PCCM admitted Urology consulted  Procedures: Status post debridement of Fournier's gangrene twice by urology Antimicrobials: Zosyn  Subjective: He is resting in bed after debridement slightly tachycardic  Objective: Vitals:   08/05/20 0910 08/05/20 0928 08/05/20 1012 08/05/20 1252  BP: 105/89 98/82 112/77 107/81  Pulse: 98 (!) 101 (!) 114 81  Resp: 19 16 16 16   Temp: 97.6 F (36.4 C) 97.6 F (36.4 C)  97.8 F (36.6 C)  TempSrc:  Oral  Axillary  SpO2: 96% 92% 95%   Weight:      Height:        Intake/Output Summary (Last 24 hours) at 08/05/2020 1429 Last data filed at 08/05/2020 0909 Gross per 24 hour  Intake 1402.43 ml  Output 630 ml  Net 772.43 ml   Filed Weights   08/03/20 0500 08/04/20 0500 08/05/20 0503  Weight: 89.3 kg 89.1 kg 94.5 kg    Examination:  General exam: Appears calm and comfortable  Respiratory  system: Clear to auscultation. Respiratory effort normal. Cardiovascular system: S1 & S2 heard, RRR. No JVD, murmurs, rubs, gallops or clicks. No pedal edema. Gastrointestinal system: Abdomen is nondistended, soft and nontender. No organomegaly or masses felt. Normal bowel sounds heard. Central nervous system: Alert and oriented. No focal neurological deficits. Extremities: Symmetric 5 x 5 power. Skin: No rashes, lesions or ulcers Psychiatry: Judgement and insight appear normal. Mood & affect appropriate.  GU covered with dressing    Data Reviewed: I have personally reviewed following labs and imaging studies  CBC: Recent Labs  Lab 08/01/20 1528  08/03/20 0056 08/04/20 0052  WBC 27.5* 21.0* 21.5*  NEUTROABS 25.6*  --   --   HGB 8.7* 7.0* 6.7*  HCT 28.1* 20.8* 20.1*  MCV 88.1 82.5 84.5  PLT 449* 347 628   Basic Metabolic Panel: Recent Labs  Lab 08/01/20 1528 08/02/20 0609 08/03/20 0056 08/04/20 0052  NA 131* 132* 131* 131*  K 3.1* 3.1* 3.2* 4.2  CL 96* 101 101 101  CO2 24 22 23 22   GLUCOSE 143* 95 160* 206*  BUN 30* 28* 28* 29*  CREATININE 1.51* 1.37* 1.56* 1.84*  CALCIUM 7.7* 7.2* 7.2* 7.3*  MG  --  1.8 1.9 2.4  PHOS  --  2.4* 3.6 3.6   GFR: Estimated Creatinine Clearance: 55.3 mL/min (A) (by C-G formula based on SCr of 1.84 mg/dL (H)). Liver Function Tests: Recent Labs  Lab 08/01/20 1528 08/02/20 0609 08/03/20 0056  AST 17 17 16   ALT 12 11 12   ALKPHOS 103 73 74  BILITOT 0.8 0.7 0.9  PROT 6.2* 5.5* 5.2*  ALBUMIN 1.7* 1.7* 1.5*   No results for input(s): LIPASE, AMYLASE in the last 168 hours. No results for input(s): AMMONIA in the last 168 hours. Coagulation Profile: Recent Labs  Lab 08/02/20 1047  INR 1.5*   Cardiac Enzymes: No results for input(s): CKTOTAL, CKMB, CKMBINDEX, TROPONINI in the last 168 hours. BNP (last 3 results) No results for input(s): PROBNP in the last 8760 hours. HbA1C: Recent Labs    08/04/20 0032  HGBA1C 8.8*   CBG: Recent Labs  Lab 08/04/20 1121 08/04/20 1646 08/04/20 2057 08/05/20 0842 08/05/20 1139  GLUCAP 258* 175* 126* 92 133*   Lipid Profile: No results for input(s): CHOL, HDL, LDLCALC, TRIG, CHOLHDL, LDLDIRECT in the last 72 hours. Thyroid Function Tests: No results for input(s): TSH, T4TOTAL, FREET4, T3FREE, THYROIDAB in the last 72 hours. Anemia Panel: No results for input(s): VITAMINB12, FOLATE, FERRITIN, TIBC, IRON, RETICCTPCT in the last 72 hours. Sepsis Labs: Recent Labs  Lab 08/01/20 1530 08/01/20 2033 08/02/20 0609 08/02/20 0612 08/02/20 1047  PROCALCITON  --   --  0.40  --   --   LATICACIDVEN 2.3* 1.2  --  0.8 0.9    Recent Results  (from the past 240 hour(s))  Resp Panel by RT-PCR (Flu A&B, Covid) Nasopharyngeal Swab     Status: None   Collection Time: 08/01/20 10:15 PM   Specimen: Nasopharyngeal Swab; Nasopharyngeal(NP) swabs in vial transport medium  Result Value Ref Range Status   SARS Coronavirus 2 by RT PCR NEGATIVE NEGATIVE Final    Comment: (NOTE) SARS-CoV-2 target nucleic acids are NOT DETECTED.  The SARS-CoV-2 RNA is generally detectable in upper respiratory specimens during the acute phase of infection. The lowest concentration of SARS-CoV-2 viral copies this assay can detect is 138 copies/mL. A negative result does not preclude SARS-Cov-2 infection and should not be used as the sole basis for treatment or  other patient management decisions. A negative result may occur with  improper specimen collection/handling, submission of specimen other than nasopharyngeal swab, presence of viral mutation(s) within the areas targeted by this assay, and inadequate number of viral copies(<138 copies/mL). A negative result must be combined with clinical observations, patient history, and epidemiological information. The expected result is Negative.  Fact Sheet for Patients:  EntrepreneurPulse.com.au  Fact Sheet for Healthcare Providers:  IncredibleEmployment.be  This test is no t yet approved or cleared by the [place] FDA and  has been authorized for detection and/or diagnosis of SARS-CoV-2 by FDA under an Emergency Use Authorization (EUA). This EUA will remain  in effect (meaning this test can be used) for the duration of the COVID-19 declaration under Section 564(b)(1) of the Act, 21 U.S.C.section 360bbb-3(b)(1), unless the authorization is terminated  or revoked sooner.       Influenza A by PCR NEGATIVE NEGATIVE Final   Influenza B by PCR NEGATIVE NEGATIVE Final    Comment: (NOTE) The Xpert Xpress SARS-CoV-2/FLU/RSV plus assay is intended as an aid in the  diagnosis of influenza from Nasopharyngeal swab specimens and should not be used as a sole basis for treatment. Nasal washings and aspirates are unacceptable for Xpert Xpress SARS-CoV-2/FLU/RSV testing.  Fact Sheet for Patients: EntrepreneurPulse.com.au  Fact Sheet for Healthcare Providers: IncredibleEmployment.be  This test is not yet approved or cleared by the [place] FDA and has been authorized for detection and/or diagnosis of SARS-CoV-2 by FDA under an Emergency Use Authorization (EUA). This EUA will remain in effect (meaning this test can be used) for the duration of the COVID-19 declaration under Section 564(b)(1) of the Act, 21 U.S.C. section 360bbb-3(b)(1), unless the authorization is terminated or revoked.  Performed at [facility], [place] [address], [place], [place] [address]   Blood Culture (routine x 2)     Status: None (Preliminary result)   Collection Time: 08/01/20 10:40 PM   Specimen: BLOOD  Result Value Ref Range Status   Specimen Description BLOOD SITE NOT SPECIFIED  Final   Special Requests   Final    BOTTLES DRAWN AEROBIC AND ANAEROBIC Blood Culture adequate volume   Culture   Final    NO GROWTH 4 DAYS Performed at [facility], [place] [address] [place], [place] [address]    Report Status PENDING  Incomplete  Blood Culture (routine x 2)     Status: None (Preliminary result)   Collection Time: 08/01/20 10:40 PM   Specimen: BLOOD  Result Value Ref Range Status   Specimen Description BLOOD SITE NOT SPECIFIED  Final   Special Requests   Final    BOTTLES DRAWN AEROBIC AND ANAEROBIC Blood Culture adequate volume   Culture   Final    NO GROWTH 4 DAYS Performed at [facility], [address], [place], [place] [address]    Report Status PENDING  Incomplete  Aerobic/Anaerobic Culture (surgical/deep wound)     Status: None (Preliminary result)   Collection Time: 08/02/20  1:37 AM   Specimen: Abscess   Result Value Ref Range Status   Specimen Description ABSCESS  Final   Special Requests PENILE  Final   Gram Stain   Final    ABUNDANT WBC PRESENT, PREDOMINANTLY PMN ABUNDANT GRAM POSITIVE COCCI RARE GRAM POSITIVE RODS    Culture   Final    FEW PROTEUS MIRABILIS RARE GROUP B STREP(S.AGALACTIAE)ISOLATED TESTING AGAINST S. AGALACTIAE NOT ROUTINELY PERFORMED DUE TO PREDICTABILITY OF AMP/PEN/VAN SUSCEPTIBILITY. HOLDING FOR  POSSIBLE ANAEROBE Performed at [facility], [place] [address] [place], [place] [address]    Report Status PENDING  Incomplete   Organism ID, Bacteria PROTEUS MIRABILIS  Final      Susceptibility   Proteus mirabilis - MIC*    AMPICILLIN >=32 RESISTANT Resistant     CEFAZOLIN 8 SENSITIVE Sensitive     CEFEPIME <=0.12 SENSITIVE Sensitive     CEFTAZIDIME <=1 SENSITIVE Sensitive     CEFTRIAXONE <=0.25 SENSITIVE Sensitive     CIPROFLOXACIN <=0.25 SENSITIVE Sensitive     GENTAMICIN <=1 SENSITIVE Sensitive     IMIPENEM 2 SENSITIVE Sensitive     TRIMETH/SULFA <=20 SENSITIVE Sensitive     AMPICILLIN/SULBACTAM >=32 RESISTANT Resistant     PIP/TAZO <=4 SENSITIVE Sensitive     * FEW PROTEUS MIRABILIS  MRSA PCR Screening     Status: None   Collection Time: 08/02/20  9:43 AM   Specimen: Nasal Mucosa; Nasopharyngeal  Result Value Ref Range Status   MRSA by PCR NEGATIVE NEGATIVE Final    Comment:        The GeneXpert MRSA Assay (FDA approved for NASAL specimens only), is one component of a comprehensive MRSA colonization surveillance program. It is not intended to diagnose MRSA infection nor to guide or monitor treatment for MRSA infections. Performed at [facility], [place] [address] [place], [place] [address]   Surgical pcr screen     Status: None   Collection Time: 08/05/20  5:08 AM   Specimen: Nasal Mucosa; Nasal Swab  Result Value Ref Range Status   MRSA, PCR NEGATIVE NEGATIVE Final   Staphylococcus aureus NEGATIVE NEGATIVE Final    Comment:  (NOTE) The Xpert SA Assay (FDA approved for NASAL specimens in patients 73 years of age and older), is one component of a comprehensive surveillance program. It is not intended to diagnose infection nor to guide or monitor treatment. Performed at [facility], [place] [address], [place], [place] [address]          Radiology Studies: ECHOCARDIOGRAM COMPLETE  Result Date: 08/04/2020    ECHOCARDIOGRAM REPORT   Patient Name:   MICHAEL DAFOE [facility] Date of Exam: 08/04/2020 Medical Rec #:  [id number]           Height:       68.0 in Accession #:    [id number]          Weight:       196.4 lb Date of Birth:  26-May-1971           BSA:          2.028 m Patient Age:    35 years            BP:           98/74 mmHg Patient Gender: M                   HR:           87 bpm. Exam Location:  Inpatient Procedure: 2D Echo Indications:    atrial fibrillation  History:        Patient has no prior history of Echocardiogram examinations.                 Chronic kidney disease. HIV., Arrythmias:Atrial Fibrillation;                 Risk Factors:Dyslipidemia and Diabetes.  Sonographer:    Johny Chess Referring Phys: [id number] [place]  IMPRESSIONS  1. Left ventricular ejection fraction, by estimation, is 40 to 45%. The left ventricle has mildly decreased function. The left ventricle demonstrates global hypokinesis. Left ventricular diastolic function could not be evaluated.  2. Right ventricular systolic function is mildly reduced. The right ventricular size is mildly enlarged. There is mildly elevated pulmonary artery systolic pressure. The estimated right ventricular systolic pressure is 123XX123 mmHg.  3. Left atrial size was mildly dilated.  4. The mitral valve is grossly normal. Trivial mitral valve regurgitation. No evidence of mitral stenosis.  5. The aortic valve is tricuspid. Aortic valve regurgitation is not visualized. No aortic stenosis is present.  6. The inferior vena cava is dilated in size with <50%  respiratory variability, suggesting right atrial pressure of 15 mmHg. FINDINGS  Left Ventricle: Left ventricular ejection fraction, by estimation, is 40 to 45%. The left ventricle has mildly decreased function. The left ventricle demonstrates global hypokinesis. The left ventricular internal cavity size was normal in size. There is  no left ventricular hypertrophy. Left ventricular diastolic function could not be evaluated due to atrial fibrillation. Left ventricular diastolic function could not be evaluated. Right Ventricle: The right ventricular size is mildly enlarged. No increase in right ventricular wall thickness. Right ventricular systolic function is mildly reduced. There is mildly elevated pulmonary artery systolic pressure. The tricuspid regurgitant  velocity is 2.62 m/s, and with an assumed right atrial pressure of 15 mmHg, the estimated right ventricular systolic pressure is 123XX123 mmHg. Left Atrium: Left atrial size was mildly dilated. Right Atrium: Right atrial size was normal in size. Pericardium: Trivial pericardial effusion is present. Mitral Valve: The mitral valve is grossly normal. Trivial mitral valve regurgitation. No evidence of mitral valve stenosis. Tricuspid Valve: The tricuspid valve is grossly normal. Tricuspid valve regurgitation is mild . No evidence of tricuspid stenosis. Aortic Valve: The aortic valve is tricuspid. Aortic valve regurgitation is not visualized. No aortic stenosis is present. Pulmonic Valve: The pulmonic valve was grossly normal. Pulmonic valve regurgitation is trivial. No evidence of pulmonic stenosis. Aorta: The aortic root and ascending aorta are structurally normal, with no evidence of dilitation. Venous: The inferior vena cava is dilated in size with less than 50% respiratory variability, suggesting right atrial pressure of 15 mmHg. IAS/Shunts: The atrial septum is grossly normal.  LEFT VENTRICLE PLAX 2D LVIDd:         5.10 cm LVIDs:         4.30 cm LV PW:          1.10 cm LV IVS:        0.90 cm LVOT diam:     2.30 cm LV SV:         37 LV SV Index:   18 LVOT Area:     4.15 cm  RIGHT VENTRICLE            IVC RV S prime:     9.46 cm/s  IVC diam: 2.50 cm TAPSE (M-mode): 1.5 cm LEFT ATRIUM             Index       RIGHT ATRIUM           Index LA diam:        5.10 cm 2.51 cm/m  RA Area:     17.60 cm LA Vol (A2C):   66.1 ml 32.59 ml/m RA Volume:   44.00 ml  21.69 ml/m LA Vol (A4C):   76.9 ml 37.92 ml/m LA Biplane Vol: 75.2 ml 37.08  ml/m  AORTIC VALVE LVOT Vmax:   55.50 cm/s LVOT Vmean:  35.900 cm/s LVOT VTI:    0.088 m  AORTA Ao Root diam: 3.50 cm Ao Asc diam:  3.60 cm TRICUSPID VALVE TR Peak grad:   27.5 mmHg TR Vmax:        262.00 cm/s  SHUNTS Systemic VTI:  0.09 m Systemic Diam: 2.30 cm [name] Electronically signed by [name] Signature Date/Time: 08/04/2020/5:22:46 PM    Final         Scheduled Meds: . sodium chloride   Intravenous Once  . Chlorhexidine Gluconate Cloth  6 each Topical Daily  . dolutegravir  50 mg Oral Daily  . emtricitabine-tenofovir AF  1 tablet Oral QHS  . insulin aspart  0-15 Units Subcutaneous TID WC  . insulin aspart  0-5 Units Subcutaneous QHS  . insulin glargine  15 Units Subcutaneous Daily  . metoprolol tartrate  100 mg Oral BID   Continuous Infusions: . piperacillin-tazobactam (ZOSYN)  IV 3.375 g (08/05/20 1005)     LOS: 3 days     Georgette Shell, MD  08/05/2020, 2:29 PM

## 2020-08-05 NOTE — Op Note (Signed)
Operative Note  Preoperative diagnosis:  1.  Necrotizing fasciitis of penis and perineum  Postoperative diagnosis: 1.  Necrotizing fasciitis of penis and perineum  Procedure(s): 1.  Washout and debridement of penile and perineal abscess  Surgeon: Rexene Alberts, MD  Assistants:  None  Anesthesia:  General  Complications:  None  EBL:  69ml  Specimens: 1. None  Drains/Catheters: 1.  Indwelling foley catheter  Intraoperative findings:   1. 1 kerlix packing was removed with necrotic and fibrinous debris overlying bilateral corpora cavernosa, suprapubic wound and perineal wound. Necrotic debris excised. Still some fibrinous debris overlying corpora. No new purulent pockets identified. There was a new skin opening in his perineum that communicates to perineal abscess cavity. The necrotic skin was debrided away.  2. Penile urethra is disrupted at take off of bulbar urethra with exposed foley catheter.  3. Perineal and penis abscess cavity re-packed with 3 kerlix wet to dry dressings.  Indication:  Francisco Hutchinson is a 49 y.o. male with necrotizing fasciitis of the penis and perineum.  He is status post initial debridement on 08/02/2019 1 repeat debridement on 08/03/2020.  He presents the operating today for an additional debridement and washout of his penis and perineum.  Description of procedure: After informed consent was obtained from the patient, the patient was taken to the operating room. General anesthesia was administered.  A single Kerlix dressing was removed.  The patient was placed in dorsal lithotomy position and prepped and draped in usual sterile fashion. Sequential compression devices were applied.  He is already on scheduled zosyn and received a dose this morning.  A surgical time-out was performed to properly identify the patient, the surgery to be performed, and the surgical site.    Exam under anesthesia demonstrates necrotic appearing tissue overlying bilateral  corpora, suprapubic region and perineal abscess cavity.  There are no new areas of purulence or undrained areas of infection.  His Foley catheter remains in place with the entire length of the penile catheter exposed.  His penile urethra is disrupted at the level of the takeoff from the bulbar urethra.  There is a new opening in his perineal skin that communicates with the perineal abscess cavity.  Scrotum remains uninvolved. Necrotic debris was sharply excised using Metzenbaum scissors including necrotic skin until bleeding tissue was identified.  There remains a fibrinous layer along the bilateral corpora cavernosa.  The Pulsavac was then used within 3 L normal saline to irrigate and debride the perineal and suprapubic abscess cavity. The perineal and suprapubic abscess cavities were packed with 3 separate saline soaked Kerlix dressings. The foley catheter was left in place. An ABD was applied. The patient emerged from anesthesia and was transferred to the PACU in stable condition.  Plan: Continue daily wet to dry dressing changes. If necrotic debris noted on dressing change, will plan for repeat debridement in the OR.  [facility]  Pager: [phone]

## 2020-08-05 NOTE — Interval H&P Note (Signed)
History and Physical Interval Note:  As above. To OR for washout and debridement of penis and perineum.  08/05/2020 7:16 AM  Francisco Hutchinson  has presented today for surgery, with the diagnosis of SCROTUM ABCESS.  The various methods of treatment have been discussed with the patient and family. After consideration of risks, benefits and other options for treatment, the patient has consented to  Procedure(s): IRRIGATION AND DEBRIDEMENT SCROTUM (N/A) as a surgical intervention.  The patient's history has been reviewed, patient examined, no change in status, stable for surgery.  I have reviewed the patient's chart and labs.  Questions were answered to the patient's satisfaction.     Janith Lima

## 2020-08-05 NOTE — Anesthesia Preprocedure Evaluation (Signed)
Anesthesia Evaluation  Patient identified by MRN, date of birth, ID band Patient awake    Reviewed: Allergy & Precautions, NPO status , Patient's Chart, lab work & pertinent test results  Airway Mallampati: I  TM Distance: >3 FB Neck ROM: Full    Dental   Pulmonary Patient abstained from smoking.,    Pulmonary exam normal        Cardiovascular hypertension, Pt. on medications Normal cardiovascular exam     Neuro/Psych Anxiety Depression CVA    GI/Hepatic   Endo/Other  diabetes, Type 2  Renal/GU Renal InsufficiencyRenal disease     Musculoskeletal   Abdominal   Peds  Hematology   Anesthesia Other Findings   Reproductive/Obstetrics                             Anesthesia Physical Anesthesia Plan  ASA: III  Anesthesia Plan: General   Post-op Pain Management:    Induction: Intravenous  PONV Risk Score and Plan: 2 and Ondansetron and Midazolam  Airway Management Planned: LMA  Additional Equipment:   Intra-op Plan:   Post-operative Plan: Extubation in OR  Informed Consent: I have reviewed the patients History and Physical, chart, labs and discussed the procedure including the risks, benefits and alternatives for the proposed anesthesia with the patient or authorized representative who has indicated his/her understanding and acceptance.       Plan Discussed with: CRNA and Surgeon  Anesthesia Plan Comments:         Anesthesia Quick Evaluation

## 2020-08-05 NOTE — Transfer of Care (Signed)
Immediate Anesthesia Transfer of Care Note  Patient: Francisco Hutchinson  Procedure(s) Performed: IRRIGATION AND DEBRIDEMENT PENIS AND PERINEUM (N/A Penis)  Patient Location: PACU  Anesthesia Type:General  Level of Consciousness: awake, alert  and oriented  Airway & Oxygen Therapy: Patient Spontanous Breathing and Patient connected to face mask oxygen  Post-op Assessment: Report given to RN and Post -op Vital signs reviewed and stable  Post vital signs: Reviewed and stable  Last Vitals:  Vitals Value Taken Time  BP 101/81 08/05/20 0840  Temp    Pulse 98 08/05/20 0843  Resp 23 08/05/20 0843  SpO2 94 % 08/05/20 0843  Vitals shown include unvalidated device data.  Last Pain:  Vitals:   08/05/20 0503  TempSrc: Oral  PainSc:       Patients Stated Pain Goal: 0 (68/11/57 2620)  Complications: No complications documented.

## 2020-08-05 NOTE — Progress Notes (Signed)
Day of Surgery Subjective: Pain controlled, no nausea or emesis. Afebrile.  Objective: Vital signs in last 24 hours: Temp:  [97.4 F (36.3 C)-98.3 F (36.8 C)] 98.3 F (36.8 C) (12/26 0503) Pulse Rate:  [97-110] 98 (12/26 0503) Resp:  [16-29] 16 (12/26 0503) BP: (91-112)/(74-85) 106/85 (12/26 0503) SpO2:  [91 %-96 %] 95 % (12/26 0503) Weight:  [94.5 kg] 94.5 kg (12/26 0503)  Intake/Output from previous day: 12/25 0701 - 12/26 0700 In: 733 [P.O.:315; I.V.:276.8; IV Piggyback:141.2] Out: 740 [Urine:740] Intake/Output this shift: No intake/output data recorded.  Physical Exam:  General: Alert and oriented CV: RRR Lungs: Clear Abdomen: Soft, ND, NT GU: packing in place  Lab Results: Recent Labs    08/03/20 0056 08/04/20 0052  HGB 7.0* 6.7*  HCT 20.8* 20.1*   BMET Recent Labs    08/03/20 0056 08/04/20 0052  NA 131* 131*  K 3.2* 4.2  CL 101 101  CO2 23 22  GLUCOSE 160* 206*  BUN 28* 29*  CREATININE 1.56* 1.84*  CALCIUM 7.2* 7.3*     Studies/Results: ECHOCARDIOGRAM COMPLETE  Result Date: 08/04/2020    ECHOCARDIOGRAM REPORT   Patient Name:   Francisco Hutchinson [facility] Date of Exam: 08/04/2020 Medical Rec #:  [id number]           Height:       68.0 in Accession #:    [id number]          Weight:       196.4 lb Date of Birth:  [birth year]           BSA:          2.028 m Patient Age:    49 years            BP:           98/74 mmHg Patient Gender: M                   HR:           87 bpm. Exam Location:  Inpatient Procedure: 2D Echo Indications:    atrial fibrillation  History:        Patient has no prior history of Echocardiogram examinations.                 Chronic kidney disease. HIV., Arrythmias:Atrial Fibrillation;                 Risk Factors:Dyslipidemia and Diabetes.  Sonographer:    Johny Chess Referring PhysZA:[id number] [place]  1. Left ventricular ejection fraction, by estimation, is 40 to 45%. The left ventricle has mildly decreased function. The  left ventricle demonstrates global hypokinesis. Left ventricular diastolic function could not be evaluated.  2. Right ventricular systolic function is mildly reduced. The right ventricular size is mildly enlarged. There is mildly elevated pulmonary artery systolic pressure. The estimated right ventricular systolic pressure is 123XX123 mmHg.  3. Left atrial size was mildly dilated.  4. The mitral valve is grossly normal. Trivial mitral valve regurgitation. No evidence of mitral stenosis.  5. The aortic valve is tricuspid. Aortic valve regurgitation is not visualized. No aortic stenosis is present.  6. The inferior vena cava is dilated in size with <50% respiratory variability, suggesting right atrial pressure of 15 mmHg. FINDINGS  Left Ventricle: Left ventricular ejection fraction, by estimation, is 40 to 45%. The left ventricle has mildly decreased function. The left ventricle demonstrates global hypokinesis. The left ventricular internal cavity size was normal in  size. There is  no left ventricular hypertrophy. Left ventricular diastolic function could not be evaluated due to atrial fibrillation. Left ventricular diastolic function could not be evaluated. Right Ventricle: The right ventricular size is mildly enlarged. No increase in right ventricular wall thickness. Right ventricular systolic function is mildly reduced. There is mildly elevated pulmonary artery systolic pressure. The tricuspid regurgitant  velocity is 2.62 m/s, and with an assumed right atrial pressure of 15 mmHg, the estimated right ventricular systolic pressure is 00.7 mmHg. Left Atrium: Left atrial size was mildly dilated. Right Atrium: Right atrial size was normal in size. Pericardium: Trivial pericardial effusion is present. Mitral Valve: The mitral valve is grossly normal. Trivial mitral valve regurgitation. No evidence of mitral valve stenosis. Tricuspid Valve: The tricuspid valve is grossly normal. Tricuspid valve regurgitation is mild . No  evidence of tricuspid stenosis. Aortic Valve: The aortic valve is tricuspid. Aortic valve regurgitation is not visualized. No aortic stenosis is present. Pulmonic Valve: The pulmonic valve was grossly normal. Pulmonic valve regurgitation is trivial. No evidence of pulmonic stenosis. Aorta: The aortic root and ascending aorta are structurally normal, with no evidence of dilitation. Venous: The inferior vena cava is dilated in size with less than 50% respiratory variability, suggesting right atrial pressure of 15 mmHg. IAS/Shunts: The atrial septum is grossly normal.  LEFT VENTRICLE PLAX 2D LVIDd:         5.10 cm LVIDs:         4.30 cm LV PW:         1.10 cm LV IVS:        0.90 cm LVOT diam:     2.30 cm LV SV:         37 LV SV Index:   18 LVOT Area:     4.15 cm  RIGHT VENTRICLE            IVC RV S prime:     9.46 cm/s  IVC diam: 2.50 cm TAPSE (M-mode): 1.5 cm LEFT ATRIUM             Index       RIGHT ATRIUM           Index LA diam:        5.10 cm 2.51 cm/m  RA Area:     17.60 cm LA Vol (A2C):   66.1 ml 32.59 ml/m RA Volume:   44.00 ml  21.69 ml/m LA Vol (A4C):   76.9 ml 37.92 ml/m LA Biplane Vol: 75.2 ml 37.08 ml/m  AORTIC VALVE LVOT Vmax:   55.50 cm/s LVOT Vmean:  35.900 cm/s LVOT VTI:    0.088 m  AORTA Ao Root diam: 3.50 cm Ao Asc diam:  3.60 cm TRICUSPID VALVE TR Peak grad:   27.5 mmHg TR Vmax:        262.00 cm/s  SHUNTS Systemic VTI:  0.09 m Systemic Diam: 2.30 cm [name] Electronically signed by [name] Signature Date/Time: 08/04/2020/5:22:46 PM    Final     Assessment/Plan: 49yo M with HIV, DM, PAD with Fourniers gangrene. CT imaging with 8cm fluid collection and air at base of penis running along shaft. S/p I&D on 12/23 and 12/24.  -To OR today for washout and debridement of penis and perineum   LOS: 3 days   Matt R. Cattie Tineo MD 08/05/2020, 7:14 AM [facility]  Pager: [phone]

## 2020-08-05 NOTE — Anesthesia Postprocedure Evaluation (Signed)
Anesthesia Post Note  Patient: Francisco Hutchinson  Procedure(s) Performed: IRRIGATION AND DEBRIDEMENT PENIS AND PERINEUM (N/A Penis)     Patient location during evaluation: PACU Anesthesia Type: General Level of consciousness: awake and alert Pain management: pain level controlled Vital Signs Assessment: post-procedure vital signs reviewed and stable Respiratory status: spontaneous breathing, nonlabored ventilation, respiratory function stable and patient connected to nasal cannula oxygen Cardiovascular status: blood pressure returned to baseline and stable Postop Assessment: no apparent nausea or vomiting Anesthetic complications: no   No complications documented.  Last Vitals:  Vitals:   08/05/20 0928 08/05/20 1012  BP: 98/82 112/77  Pulse: (!) 101 (!) 114  Resp: 16 16  Temp: 36.4 C   SpO2: 92% 95%    Last Pain:  Vitals:   08/05/20 0928  TempSrc: Oral  PainSc: 0-No pain                 Jailen Coward DAVID

## 2020-08-06 ENCOUNTER — Encounter (HOSPITAL_COMMUNITY): Payer: Self-pay | Admitting: Urology

## 2020-08-06 ENCOUNTER — Inpatient Hospital Stay (HOSPITAL_COMMUNITY): Payer: Medicare Other

## 2020-08-06 DIAGNOSIS — N4821 Abscess of corpus cavernosum and penis: Secondary | ICD-10-CM | POA: Diagnosis not present

## 2020-08-06 DIAGNOSIS — L8990 Pressure ulcer of unspecified site, unspecified stage: Secondary | ICD-10-CM | POA: Insufficient documentation

## 2020-08-06 DIAGNOSIS — I959 Hypotension, unspecified: Secondary | ICD-10-CM

## 2020-08-06 DIAGNOSIS — R000 Tachycardia, unspecified: Secondary | ICD-10-CM

## 2020-08-06 HISTORY — PX: IR US GUIDANCE: IMG2393

## 2020-08-06 LAB — COMPREHENSIVE METABOLIC PANEL
ALT: 13 U/L (ref 0–44)
AST: 13 U/L — ABNORMAL LOW (ref 15–41)
Albumin: 1.4 g/dL — ABNORMAL LOW (ref 3.5–5.0)
Alkaline Phosphatase: 75 U/L (ref 38–126)
Anion gap: 9 (ref 5–15)
BUN: 36 mg/dL — ABNORMAL HIGH (ref 6–20)
CO2: 22 mmol/L (ref 22–32)
Calcium: 7.4 mg/dL — ABNORMAL LOW (ref 8.9–10.3)
Chloride: 104 mmol/L (ref 98–111)
Creatinine, Ser: 2.42 mg/dL — ABNORMAL HIGH (ref 0.61–1.24)
GFR, Estimated: 32 mL/min — ABNORMAL LOW (ref >60)
Glucose, Bld: 232 mg/dL — ABNORMAL HIGH (ref 70–99)
Potassium: 4.7 mmol/L (ref 3.5–5.1)
Sodium: 135 mmol/L (ref 135–145)
Total Bilirubin: 0.2 mg/dL — ABNORMAL LOW (ref 0.3–1.2)
Total Protein: 5.5 g/dL — ABNORMAL LOW (ref 6.5–8.1)

## 2020-08-06 LAB — CBC
HCT: 24.3 % — ABNORMAL LOW (ref 39.0–52.0)
Hemoglobin: 8.1 g/dL — ABNORMAL LOW (ref 13.0–17.0)
MCH: 28.6 pg (ref 26.0–34.0)
MCHC: 33.3 g/dL (ref 30.0–36.0)
MCV: 85.9 fL (ref 80.0–100.0)
Platelets: 410 10*3/uL — ABNORMAL HIGH (ref 150–400)
RBC: 2.83 MIL/uL — ABNORMAL LOW (ref 4.22–5.81)
RDW: 17.6 % — ABNORMAL HIGH (ref 11.5–15.5)
WBC: 14.4 10*3/uL — ABNORMAL HIGH (ref 4.0–10.5)
nRBC: 0 % (ref 0.0–0.2)

## 2020-08-06 LAB — CULTURE, BLOOD (ROUTINE X 2)
Culture: NO GROWTH
Culture: NO GROWTH
Special Requests: ADEQUATE
Special Requests: ADEQUATE

## 2020-08-06 LAB — GLUCOSE, CAPILLARY
Glucose-Capillary: 215 mg/dL — ABNORMAL HIGH (ref 70–99)
Glucose-Capillary: 159 mg/dL — ABNORMAL HIGH (ref 70–99)
Glucose-Capillary: 64 mg/dL — ABNORMAL LOW (ref 70–99)
Glucose-Capillary: 125 mg/dL — ABNORMAL HIGH (ref 70–99)

## 2020-08-06 MED ORDER — FENTANYL CITRATE (PF) 100 MCG/2ML IJ SOLN
INTRAMUSCULAR | Status: AC | PRN
  Administered 2020-08-06: 50 ug via INTRAVENOUS

## 2020-08-06 MED ORDER — MIDAZOLAM HCL 2 MG/2ML IJ SOLN
INTRAMUSCULAR | Status: AC
  Filled 2020-08-06: qty 2

## 2020-08-06 MED ORDER — SODIUM CHLORIDE 0.9 % IV SOLN
2.0000 g | INTRAVENOUS | Status: DC
  Administered 2020-08-06 – 2020-08-15 (×10): 2 g via INTRAVENOUS
  Filled 2020-08-06: qty 20
  Filled 2020-08-06: qty 2
  Filled 2020-08-06 (×3): qty 20
  Filled 2020-08-06 (×3): qty 2
  Filled 2020-08-06 (×3): qty 20

## 2020-08-06 MED ORDER — LIDOCAINE HCL 1 % IJ SOLN
INTRAMUSCULAR | Status: AC
  Filled 2020-08-06: qty 20

## 2020-08-06 MED ORDER — LIDOCAINE HCL 1 % IJ SOLN
INTRAMUSCULAR | Status: AC | PRN
Start: 2020-08-06 — End: 2020-08-06
  Administered 2020-08-06: 8 mL

## 2020-08-06 MED ORDER — MIDAZOLAM HCL 2 MG/2ML IJ SOLN
INTRAMUSCULAR | Status: AC | PRN
Start: 2020-08-06 — End: 2020-08-06
  Administered 2020-08-06: 1 mg via INTRAVENOUS

## 2020-08-06 MED ORDER — IOHEXOL 300 MG/ML  SOLN
50.0000 mL | Freq: Once | INTRAMUSCULAR | Status: AC | PRN
  Administered 2020-08-06: 10 mL

## 2020-08-06 MED ORDER — FENTANYL CITRATE (PF) 100 MCG/2ML IJ SOLN
INTRAMUSCULAR | Status: AC
  Filled 2020-08-06: qty 2

## 2020-08-06 NOTE — Consult Note (Signed)
Chief Complaint: Patient was seen in consultation today for  Chief Complaint  Patient presents with  . Wound Infection    Referring Physician(s): Dr. Claudia Desanctis  Supervising Physician: Dr. Serafina Royals  Patient Status: [facility] - In-pt  History of Present Illness: Francisco Hutchinson is a 49 y.o. male with a medical history significant for HIV, DM2, HTN, stroke, atrial fibrillation (not on anticoagulation), right leg above-the-knee amputation, anxiety and depression. He also has a prior history of Fournier's Gangrene with peri-rectal and perineal fasciitis that involved the peri-urethral area around the bulb requiring several debridements with urology in 2018; left hip necrotizing fasciitis with hospitalization September 2021.   He currently resides at a SNF and was brought to the [facility] ED 08/01/20 for a groin wound/infection. The patient was found to have an abscess at the distal end of his penis with erythema and fluctuance at his pannus. Lab work was positive for a WBC of 27.5 and lactic acid of 3.2. He was tachycardic, hypotensive and was admitted for septic shock secondary to Fournier's Gangrene.   Francisco Hutchinson was taken to the OR 08/02/20 and 08/03/20 for I&Ds of the penile abscess and suprapubic area; cystoscopy. The patient has a chronic foley catheter due to immobility and the foley was seen in the abscess cavity.   Interventional Radiology has been asked to evaluate this patient for an image-guided suprapubic catheter placement.   Past Medical History:  Diagnosis Date  . Allergy   . Anxiety and depression 05/29/2017  . Diabetes mellitus without complication ([place])    type 2  . HIV (human immunodeficiency virus infection) ([place])   . Hypertension   . Stroke [facility])     Past Surgical History:  Procedure Laterality Date  . ABDOMINAL AORTOGRAM W/LOWER EXTREMITY N/A 01/20/2018   Procedure: ABDOMINAL AORTOGRAM W/LOWER EXTREMITY;  Surgeon: Waynetta Sandy, MD;  Location: [place] [facility];  Service: Cardiovascular;  Laterality: N/A;  . abscess removed from back and left armpit    . AMPUTATION Right 01/22/2018   Procedure: RIGHT BELOW KNEE AMPUTATION;  Surgeon: [name], MD;  Location: [place];  Service: Orthopedics;  Laterality: Right;  . CYSTOSCOPY WITH STENT PLACEMENT N/A 08/01/2020   Procedure: CYSTOSCOPY;  Surgeon: Bjorn Loser, MD;  Location: [place];  Service: Urology;  Laterality: N/A;  . IRRIGATION AND DEBRIDEMENT ABSCESS N/A 08/05/2020   Procedure: IRRIGATION AND DEBRIDEMENT PENIS AND PERINEUM;  Surgeon: Janith Lima, MD;  Location: [place];  Service: Urology;  Laterality: N/A;  . IRRIGATION AND DEBRIDEMENT BUTTOCKS Left 03/23/2017   Procedure: IRRIGATION AND DEBRIDEMENT LEFT BUTTOCK ABSCESS;  Surgeon: Excell Seltzer, MD;  Location: [facility];  Service: General;  Laterality: Left;  . IRRIGATION AND DEBRIDEMENT BUTTOCKS N/A 03/25/2017   Procedure: IRRIGATION AND DEBRIDEMENT DRESSING CHANGE AND EXPLORATION OF PERINEAL WOUND; CYSTOSCOPY;  Surgeon: Excell Seltzer, MD;  Location: [facility];  Service: General;  Laterality: N/A;  . PARS PLANA VITRECTOMY 27 GAUGE WITH 25 GAUGE PORT Left 08/31/2017  . Surgery Right Arm    . WOUND EXPLORATION Bilateral 08/01/2020   Procedure: EXPLORATION AND DEBRIDEMENT GROINS;  Surgeon: Bjorn Loser, MD;  Location: [place];  Service: Urology;  Laterality: Bilateral;  . WOUND EXPLORATION N/A 08/03/2020   Procedure: Re-exploration and debridement of penis and groins;  Surgeon: Robley Fries, MD;  Location: [place];  Service: Urology;  Laterality: N/A;    Allergies: Patient has no known allergies.  Medications: Prior to Admission medications   Medication Sig Start  Date End Date Taking? Authorizing Provider  acetaminophen (TYLENOL) 325 MG tablet Take 2 tablets (650 mg total) by mouth every 6 (six) hours as needed for mild pain, fever or headache (or Fever >/= 101). 12/29/17  Yes Ardith Dark, MD  amLODipine  (NORVASC) 5 MG tablet TAKE 1 TABLET BY MOUTH EVERY DAY Patient taking differently: Take 5 mg by mouth daily. 07/09/20  Yes Ardith Dark, MD  aspirin EC 81 MG tablet Take 81 mg by mouth at bedtime.   Yes [provider]  atorvastatin (LIPITOR) 40 MG tablet Take 1 tablet (40 mg total) by mouth daily. 08/29/19  Yes Ardith Dark, MD  cyclobenzaprine (FLEXERIL) 10 MG tablet Take 1 tablet (10 mg total) by mouth 3 (three) times daily as needed for muscle spasms. 07/30/18  Yes Ardith Dark, MD  DESCOVY 200-25 MG tablet Take 1 tablet by mouth at bedtime. 11/30/19  Yes Cliffton Asters, MD  Dulaglutide (TRULICITY) 1.5 MG/0.5ML SOPN INJECT 1 PEN SUBCUTANEOUSLY ONCE A WEEK ON SUNDAY Patient taking differently: Inject 1.5 mg into the skin once a week. Take on Sundays 12/14/19  Yes Ardith Dark, MD  ferrous gluconate (FERGON) 324 MG tablet Take 1 tablet (324 mg total) by mouth daily with breakfast. 01/26/18  Yes Dhungel, Nishant, MD  hydrochlorothiazide (HYDRODIURIL) 25 MG tablet TAKE 1 TABLET BY MOUTH EVERY DAY Patient taking differently: Take 25 mg by mouth daily. 04/11/20  Yes Ardith Dark, MD  Incontinence Supplies KIT Use as needed for incontinence 04/16/18  Yes Ardith Dark, MD  JARDIANCE 10 MG TABS tablet TAKE 1 TABLET BY MOUTH EVERY DAY Patient taking differently: Take 10 mg by mouth daily. 07/09/20  Yes Ardith Dark, MD  LANTUS SOLOSTAR 100 UNIT/ML Solostar Pen Inject 55 Units into the skin at bedtime. 07/27/19  Yes Ardith Dark, MD  metoprolol tartrate (LOPRESSOR) 100 MG tablet Take 1 tablet (100 mg total) by mouth 2 (two) times daily. 12/05/19  Yes Ardith Dark, MD  sertraline (ZOLOFT) 100 MG tablet TAKE 1 TABLET BY MOUTH TWICE A DAY Patient taking differently: Take 100 mg by mouth daily. 06/24/20  Yes Ardith Dark, MD  TIVICAY 50 MG tablet Take 1 tablet (50 mg total) by mouth daily. 11/30/19  Yes Cliffton Asters, MD  acetic acid 2 % otic solution Place 6 drops into the right  ear every 3 (three) hours. Patient not taking: No sig reported 12/01/19   Ardith Dark, MD  amoxicillin (AMOXIL) 875 MG tablet Take 1 tablet (875 mg total) by mouth 2 (two) times daily. Patient not taking: No sig reported 11/08/19   Ardith Dark, MD  collagenase (SANTYL) ointment Apply 1 application topically daily. Patient not taking: No sig reported 05/12/18   Ardith Dark, MD  fenofibrate (TRICOR) 48 MG tablet Take 1 tablet by mouth once daily Patient not taking: Reported on 08/01/2020 04/11/20   Ardith Dark, MD  fenofibrate micronized (ANTARA) 43 MG capsule TAKE 1 CAPSULE BY MOUTH ONCE DAILY BEFORE BREAKFAST Patient not taking: No sig reported 04/11/20   Ardith Dark, MD     Family History  Problem Relation Age of Onset  . Arthritis Mother   . Hypertension Mother   . Alcohol abuse Father   . Cancer Father   . Diabetes Father   . Early death Father   . Hyperlipidemia Father   . Hypertension Father   . Alcohol abuse Sister   . Heart attack Maternal  Grandmother   . Heart disease Maternal Grandmother   . Hypertension Maternal Grandmother   . Cancer Maternal Grandfather        lung  . Cancer Paternal Grandmother   . Diabetes Paternal Grandmother     Social History   Socioeconomic History  . Marital status: Single    Spouse name: Not on file  . Number of children: Not on file  . Years of education: Not on file  . Highest education level: Not on file  Occupational History  . Not on file  Tobacco Use  . Smoking status: Never Smoker  . Smokeless tobacco: Never Used  Vaping Use  . Vaping Use: Never used  Substance and Sexual Activity  . Alcohol use: Yes    Alcohol/week: 2.0 standard drinks    Types: 1 Glasses of wine, 1 Cans of beer per week    Comment: rare  . Drug use: No  . Sexual activity: Not Currently  Other Topics Concern  . Not on file  Social History Narrative  . Not on file   Social Determinants of Health   Financial Resource Strain: Not on  file  Food Insecurity: Not on file  Transportation Needs: Not on file  Physical Activity: Not on file  Stress: Not on file  Social Connections: Not on file    Review of Systems: A 12 point ROS discussed and pertinent positives are indicated in the HPI above.  All other systems are negative.  Review of Systems  Constitutional: Positive for appetite change and fatigue.  Respiratory: Negative for cough and shortness of breath.   Cardiovascular: Negative for chest pain and leg swelling.  Gastrointestinal: Negative for abdominal pain, nausea and vomiting.  Genitourinary: Positive for difficulty urinating, genital sores and penile pain.  Skin: Positive for wound.       Mid-suprapubic and penile area.   Neurological: Negative for headaches.    Vital Signs: BP 100/78   Pulse 89   Temp 98.2 F (36.8 C) (Oral)   Resp 18   Ht $R'5\' 8"'Gq$  (1.727 m)   Wt 205 lb 11 oz (93.3 kg)   SpO2 99%   BMI 31.27 kg/m   Physical Exam Constitutional:      General: He is not in acute distress.    Comments: Sleepy but easily arousable and answered all questions appropriately.   HENT:     Mouth/Throat:     Mouth: Mucous membranes are moist.     Pharynx: Oropharynx is clear.  Cardiovascular:     Rate and Rhythm: Normal rate. Rhythm irregular.  Pulmonary:     Effort: Pulmonary effort is normal.  Abdominal:     General: Bowel sounds are normal.  Genitourinary:    Comments: Suprapubic/penile wound post I&D; packed with gauze.  Musculoskeletal:     Comments: Right AKA  Skin:    General: Skin is warm and dry.     Imaging: CT Abdomen Pelvis W Contrast  Result Date: 08/01/2020 CLINICAL DATA:  Groin wound infection. EXAM: CT ABDOMEN AND PELVIS WITH CONTRAST TECHNIQUE: Multidetector CT imaging of the abdomen and pelvis was performed using the standard protocol following bolus administration of intravenous contrast. CONTRAST:  169mL OMNIPAQUE IOHEXOL 300 MG/ML  SOLN COMPARISON:  April 23, 2020  FINDINGS: Lower chest: No acute abnormality. Hepatobiliary: No focal liver abnormality is seen. No gallstones, gallbladder wall thickening, or biliary dilatation. Pancreas: Numerous subcentimeter parenchymal calcifications are seen scattered throughout the pancreas. Spleen: Normal in size without focal abnormality. Adrenals/Urinary Tract:  Adrenal glands are unremarkable. Kidneys are normal, without renal calculi, focal lesion, or hydronephrosis. A Foley catheter is seen within a nearly empty urinary bladder. A mild amount of air is seen within the bladder lumen with moderate severity diffuse bladder wall thickening. Stomach/Bowel: Stomach is within normal limits. Appendix appears normal. No evidence of bowel wall thickening, distention, or inflammatory changes. Vascular/Lymphatic: No significant vascular findings are present. No enlarged abdominal or pelvic lymph nodes. Reproductive: Prostate is unremarkable. Other: There is a 3.5 cm x 2.2 cm fat containing right inguinal hernia. A 3.1 cm x 1.7 cm fat containing left inguinal hernia is also noted. A 1.9 cm thick, the elongated (approximately 8.0 cm) complex collection of fluid and air is seen adjacent to the dorsal aspect of the corpora cavernosa of the base of the penis. A thin, surrounding hyperdense rim is present with associated moderate severity adjacent inflammatory fat stranding. Musculoskeletal: A large soft tissue defect is seen along the lateral aspect of the left hip, with findings suggestive of prior irrigation and debridement since the prior study. Predominantly stable heterogeneous sclerotic areas are seen involving predominantly the L1, L2, L3 and L4 vertebral bodies. IMPRESSION: 1. Findings consistent with an abscess adjacent to the dorsal aspect of the corpora cavernosa of the base of the penis, as described above. 2. Bilateral fat containing inguinal hernias. 3. Findings consistent with chronic pancreatitis. 4. Suspected interval wound irrigation  and debridement along the lateral aspect of the left hip since the prior study. Electronically Signed   By: Virgina Norfolk M.D.   On: 08/01/2020 22:23   DG Chest Port 1 View  Result Date: 08/01/2020 CLINICAL DATA:  Sepsis. EXAM: PORTABLE CHEST 1 VIEW COMPARISON:  04/24/2020 FINDINGS: The heart size remains enlarged. There is no focal infiltrate. No large pleural effusion. No pneumothorax. There is no acute osseous abnormality. IMPRESSION: No active disease. Electronically Signed   By: Constance Holster M.D.   On: 08/01/2020 21:02   ECHOCARDIOGRAM COMPLETE  Result Date: 08/04/2020    ECHOCARDIOGRAM REPORT   Patient Name:   D'ARCY ABRAHA [facility] Date of Exam: 08/04/2020 Medical Rec #:  [id number]           Height:       68.0 in Accession #:    [id number]          Weight:       196.4 lb Date of Birth:  [birth year]           BSA:          2.028 m Patient Age:    57 years            BP:           98/74 mmHg Patient Gender: M                   HR:           87 bpm. Exam Location:  Inpatient Procedure: 2D Echo Indications:    atrial fibrillation  History:        Patient has no prior history of Echocardiogram examinations.                 Chronic kidney disease. HIV., Arrythmias:Atrial Fibrillation;                 Risk Factors:Dyslipidemia and Diabetes.  Sonographer:    Johny Chess Referring Phys: [id number] [place]  1. Left ventricular ejection fraction, by estimation, is 40 to 45%. The left  ventricle has mildly decreased function. The left ventricle demonstrates global hypokinesis. Left ventricular diastolic function could not be evaluated.  2. Right ventricular systolic function is mildly reduced. The right ventricular size is mildly enlarged. There is mildly elevated pulmonary artery systolic pressure. The estimated right ventricular systolic pressure is 70.2 mmHg.  3. Left atrial size was mildly dilated.  4. The mitral valve is grossly normal. Trivial mitral valve regurgitation. No  evidence of mitral stenosis.  5. The aortic valve is tricuspid. Aortic valve regurgitation is not visualized. No aortic stenosis is present.  6. The inferior vena cava is dilated in size with <50% respiratory variability, suggesting right atrial pressure of 15 mmHg. FINDINGS  Left Ventricle: Left ventricular ejection fraction, by estimation, is 40 to 45%. The left ventricle has mildly decreased function. The left ventricle demonstrates global hypokinesis. The left ventricular internal cavity size was normal in size. There is  no left ventricular hypertrophy. Left ventricular diastolic function could not be evaluated due to atrial fibrillation. Left ventricular diastolic function could not be evaluated. Right Ventricle: The right ventricular size is mildly enlarged. No increase in right ventricular wall thickness. Right ventricular systolic function is mildly reduced. There is mildly elevated pulmonary artery systolic pressure. The tricuspid regurgitant  velocity is 2.62 m/s, and with an assumed right atrial pressure of 15 mmHg, the estimated right ventricular systolic pressure is 63.7 mmHg. Left Atrium: Left atrial size was mildly dilated. Right Atrium: Right atrial size was normal in size. Pericardium: Trivial pericardial effusion is present. Mitral Valve: The mitral valve is grossly normal. Trivial mitral valve regurgitation. No evidence of mitral valve stenosis. Tricuspid Valve: The tricuspid valve is grossly normal. Tricuspid valve regurgitation is mild . No evidence of tricuspid stenosis. Aortic Valve: The aortic valve is tricuspid. Aortic valve regurgitation is not visualized. No aortic stenosis is present. Pulmonic Valve: The pulmonic valve was grossly normal. Pulmonic valve regurgitation is trivial. No evidence of pulmonic stenosis. Aorta: The aortic root and ascending aorta are structurally normal, with no evidence of dilitation. Venous: The inferior vena cava is dilated in size with less than 50%  respiratory variability, suggesting right atrial pressure of 15 mmHg. IAS/Shunts: The atrial septum is grossly normal.  LEFT VENTRICLE PLAX 2D LVIDd:         5.10 cm LVIDs:         4.30 cm LV PW:         1.10 cm LV IVS:        0.90 cm LVOT diam:     2.30 cm LV SV:         37 LV SV Index:   18 LVOT Area:     4.15 cm  RIGHT VENTRICLE            IVC RV S prime:     9.46 cm/s  IVC diam: 2.50 cm TAPSE (M-mode): 1.5 cm LEFT ATRIUM             Index       RIGHT ATRIUM           Index LA diam:        5.10 cm 2.51 cm/m  RA Area:     17.60 cm LA Vol (A2C):   66.1 ml 32.59 ml/m RA Volume:   44.00 ml  21.69 ml/m LA Vol (A4C):   76.9 ml 37.92 ml/m LA Biplane Vol: 75.2 ml 37.08 ml/m  AORTIC VALVE LVOT Vmax:   55.50 cm/s LVOT Vmean:  35.900 cm/s  LVOT VTI:    0.088 m  AORTA Ao Root diam: 3.50 cm Ao Asc diam:  3.60 cm TRICUSPID VALVE TR Peak grad:   27.5 mmHg TR Vmax:        262.00 cm/s  SHUNTS Systemic VTI:  0.09 m Systemic Diam: 2.30 cm [name] Electronically signed by [name] Signature Date/Time: 08/04/2020/5:22:46 PM    Final     Labs:  CBC: Recent Labs    08/01/20 1528 08/03/20 0056 08/04/20 0052 08/06/20 0237  WBC 27.5* 21.0* 21.5* 14.4*  HGB 8.7* 7.0* 6.7* 8.1*  HCT 28.1* 20.8* 20.1* 24.3*  PLT 449* 347 342 410*    COAGS: Recent Labs    08/02/20 1047  INR 1.5*  APTT 45*    BMP: Recent Labs    08/02/20 0609 08/03/20 0056 08/04/20 0052 08/06/20 0237  NA 132* 131* 131* 135  K 3.1* 3.2* 4.2 4.7  CL 101 101 101 104  CO2 $Re'22 23 22 22  'Xow$ GLUCOSE 95 160* 206* 232*  BUN 28* 28* 29* 36*  CALCIUM 7.2* 7.2* 7.3* 7.4*  CREATININE 1.37* 1.56* 1.84* 2.42*  GFRNONAA >60 55* 45* 32*    LIVER FUNCTION TESTS: Recent Labs    08/01/20 1528 08/02/20 0609 08/03/20 0056 08/06/20 0237  BILITOT 0.8 0.7 0.9 0.2*  AST $Re'17 17 16 'bsq$ 13*  ALT $Re'12 11 12 13  'sJG$ ALKPHOS 103 73 74 75  PROT 6.2* 5.5* 5.2* 5.5*  ALBUMIN 1.7* 1.7* 1.5* 1.4*    TUMOR MARKERS: No results for input(s): AFPTM,  CEA, CA199, CHROMGRNA in the last 8760 hours.  Assessment and Plan:  Fournier's Gangrene; chronic indwelling foley catheter: Francisco Hutchinson, 49 year old male, is tentatively scheduled to be seen 08/06/20 at the [facility] department for an image-guided supra-pubic catheter placement.   Risks and benefits discussed with the patient including bleeding, infection, damage to adjacent structures, bowel perforation/fistula connection, and sepsis.  All of the patient's questions were answered, patient is agreeable to proceed.  The patient last had a Glucerna shake at 0800. Labs and vitals have been reviewed.  Consent signed and in chart.  Thank you for this interesting consult.  I greatly enjoyed meeting Francisco Hutchinson and look forward to participating in their care.  A copy of this report was sent to the requesting provider on this date.  Electronically Signed: Soyla Dryer, AGACNP-BC [phone] 08/06/2020, 10:37 AM   I spent a total of 20 Minutes    in face to face in clinical consultation, greater than 50% of which was counseling/coordinating care for supra-pubic catheter placement.

## 2020-08-06 NOTE — Progress Notes (Addendum)
PROGRESS NOTE    Francisco Hutchinson  [id number] DOB: [birth year] DOA: 08/01/2020 PCP: [name], MD   Brief Narrative: 49 year old nursing home resident admitted on 08/02/2020 by PCCM.  He was found to have Fournier's gangrene, seen by urology exploratory I&D was done and he was taken to the OR again on 08/05/2020 for debridement of penile and perineal abscess as patient had necrotizing fasciitis of penis and perineum. Wound culture growing Proteus blood culture negative MRSA PCR negative Covid negative flu negative. He has been treated with Zosyn TRH pickup 08/05/2020  Assessment & Plan:   Active Problems:   Fourniers gangrene   Penile abscess   Pressure injury of skin   #1  Forniers  gangrene he is status post debridement twice by urology.  Culture growing Proteus continue Zosyn.  Patient scheduled to have suprapubic catheter placed by interventional radiology. Patient afebrile, WBC is trending down from 21-to 14.  #2  HIV antiretrovirals restarted continue.  #3 history of stroke on aspirin prior to admission  #4 history of type 2 diabetes continue Lantus. CBG (last 3)  Recent Labs    08/05/20 2156 08/06/20 0720 08/06/20 1122  GLUCAP 208* 215* 159*     #5 history of anxiety and depression continue home meds  #6 hyponatremia sodium improving sodium 131.    #7 CKD stage III A stable creatinine 1.8 up from 1.5 yesterday monitor closely.  #8 stage II pressure injury present on admission see below  Pressure Injury 03/24/17 Stage II -  Partial thickness loss of dermis presenting as a shallow open ulcer with a red, pink wound bed without slough. (Active)  03/24/17 1131  Location: Elbow  Location Orientation: Right  Staging: Stage II -  Partial thickness loss of dermis presenting as a shallow open ulcer with a red, pink wound bed without slough.  Wound Description (Comments):   Present on Admission: Yes     Pressure Injury 08/05/20 Hip Anterior;Left Stage 4 -  Full thickness tissue loss with exposed bone, tendon or muscle. (Active)  08/05/20 2000  Location: Hip  Location Orientation: Anterior;Left  Staging: Stage 4 - Full thickness tissue loss with exposed bone, tendon or muscle.  Wound Description (Comments):   Present on Admission: Yes     Pressure Injury 08/05/20 Heel Left Deep Tissue Pressure Injury - Purple or maroon localized area of discolored intact skin or blood-filled blister due to damage of underlying soft tissue from pressure and/or shear. (Active)  08/05/20 2000  Location: Heel  Location Orientation: Left  Staging: Deep Tissue Pressure Injury - Purple or maroon localized area of discolored intact skin or blood-filled blister due to damage of underlying soft tissue from pressure and/or shear.  Wound Description (Comments):   Present on Admission: Yes      Nutrition Problem: Increased nutrient needs Etiology: wound healing     Signs/Symptoms: estimated needs    Interventions: Ensure Enlive (each supplement provides 350kcal and 20 grams of protein),MVI  Estimated body mass index is 31.27 kg/m as calculated from the following:   Height as of this encounter: 5\' 8"  (1.727 m).   Weight as of this encounter: 93.3 kg.  DVT prophylaxis: Not on anticoagulation due to recent surgery Code Status: Full code Family Communication: None at bedside Disposition Plan:  Status is: Inpatient  Dispo: The patient is from: SNF              Anticipated d/c is to: SNF  Anticipated d/c date is: > 3 days              Patient currently is not medically stable to d/c.  Is to undergo suprapubic catheter placement today.  Need clearance from urology prior to discharge.  Consultants: PCCM admitted this patient Urology consulted  Procedures: Status post debridement of Fournier's gangrene twice by urology Antimicrobials: Zosyn  Subjective: Resting in bed awake and alert answering questions appropriately.  Objective: Vitals:    08/06/20 0514 08/06/20 0721 08/06/20 0853 08/06/20 1123  BP:  100/81 100/78 102/78  Pulse:  91 89 89  Resp:  18  18  Temp: 98 F (36.7 C) 98.2 F (36.8 C)  98.2 F (36.8 C)  TempSrc: Oral Oral  Oral  SpO2:  99%  99%  Weight: 93.3 kg     Height:        Intake/Output Summary (Last 24 hours) at 08/06/2020 1341 Last data filed at 08/05/2020 2200 Gross per 24 hour  Intake 1008.89 ml  Output 475 ml  Net 533.89 ml   Filed Weights   08/04/20 0500 08/05/20 0503 08/06/20 0514  Weight: 89.1 kg 94.5 kg 93.3 kg    Examination:  General exam: Appears calm and comfortable  Respiratory system: Clear to auscultation. Respiratory effort normal. Cardiovascular system: S1 & S2 heard, RRR. No JVD, murmurs, rubs, gallops or clicks. No pedal edema. Gastrointestinal system: Abdomen is nondistended, soft and nontender. No organomegaly or masses felt. Normal bowel sounds heard. Central nervous system: Alert and oriented. No focal neurological deficits. Extremities: Symmetric 5 x 5 power. Skin: No rashes, lesions or ulcers Psychiatry: Judgement and insight appear normal. Mood & affect appropriate.  GU covered with dressing    Data Reviewed: I have personally reviewed following labs and imaging studies  CBC: Recent Labs  Lab 08/01/20 1528 08/03/20 0056 08/04/20 0052 08/06/20 0237  WBC 27.5* 21.0* 21.5* 14.4*  NEUTROABS 25.6*  --   --   --   HGB 8.7* 7.0* 6.7* 8.1*  HCT 28.1* 20.8* 20.1* 24.3*  MCV 88.1 82.5 84.5 85.9  PLT 449* 347 342 [id number]*   Basic Metabolic Panel: Recent Labs  Lab 08/01/20 1528 08/02/20 0609 08/03/20 0056 08/04/20 0052 08/06/20 0237  NA 131* 132* 131* 131* 135  K 3.1* 3.1* 3.2* 4.2 4.7  CL 96* 101 101 101 104  CO2 24 22 23 22 22   GLUCOSE 143* 95 160* 206* 232*  BUN 30* 28* 28* 29* 36*  CREATININE 1.51* 1.37* 1.56* 1.84* 2.42*  CALCIUM 7.7* 7.2* 7.2* 7.3* 7.4*  MG  --  1.8 1.9 2.4  --   PHOS  --  2.4* 3.6 3.6  --    GFR: Estimated Creatinine Clearance:  41.8 mL/min (A) (by C-G formula based on SCr of 2.42 mg/dL (H)). Liver Function Tests: Recent Labs  Lab 08/01/20 1528 08/02/20 0609 08/03/20 0056 08/06/20 0237  AST 17 17 16  13*  ALT 12 11 12 13   ALKPHOS 103 73 74 75  BILITOT 0.8 0.7 0.9 0.2*  PROT 6.2* 5.5* 5.2* 5.5*  ALBUMIN 1.7* 1.7* 1.5* 1.4*   No results for input(s): LIPASE, AMYLASE in the last 168 hours. No results for input(s): AMMONIA in the last 168 hours. Coagulation Profile: Recent Labs  Lab 08/02/20 1047  INR 1.5*   Cardiac Enzymes: No results for input(s): CKTOTAL, CKMB, CKMBINDEX, TROPONINI in the last 168 hours. BNP (last 3 results) No results for input(s): PROBNP in the last 8760 hours. HbA1C: Recent Labs  08/04/20 0032  HGBA1C 8.8*   CBG: Recent Labs  Lab 08/05/20 1139 08/05/20 1649 08/05/20 2156 08/06/20 0720 08/06/20 1122  GLUCAP 133* 185* 208* 215* 159*   Lipid Profile: No results for input(s): CHOL, HDL, LDLCALC, TRIG, CHOLHDL, LDLDIRECT in the last 72 hours. Thyroid Function Tests: No results for input(s): TSH, T4TOTAL, FREET4, T3FREE, THYROIDAB in the last 72 hours. Anemia Panel: No results for input(s): VITAMINB12, FOLATE, FERRITIN, TIBC, IRON, RETICCTPCT in the last 72 hours. Sepsis Labs: Recent Labs  Lab 08/01/20 1530 08/01/20 2033 08/02/20 0609 08/02/20 0612 08/02/20 1047  PROCALCITON  --   --  0.40  --   --   LATICACIDVEN 2.3* 1.2  --  0.8 0.9    Recent Results (from the past 240 hour(s))  Resp Panel by RT-PCR (Flu A&B, Covid) Nasopharyngeal Swab     Status: None   Collection Time: 08/01/20 10:15 PM   Specimen: Nasopharyngeal Swab; Nasopharyngeal(NP) swabs in vial transport medium  Result Value Ref Range Status   SARS Coronavirus 2 by RT PCR NEGATIVE NEGATIVE Final    Comment: (NOTE) SARS-CoV-2 target nucleic acids are NOT DETECTED.  The SARS-CoV-2 RNA is generally detectable in upper respiratory specimens during the acute phase of infection. The  lowest concentration of SARS-CoV-2 viral copies this assay can detect is 138 copies/mL. A negative result does not preclude SARS-Cov-2 infection and should not be used as the sole basis for treatment or other patient management decisions. A negative result may occur with  improper specimen collection/handling, submission of specimen other than nasopharyngeal swab, presence of viral mutation(s) within the areas targeted by this assay, and inadequate number of viral copies(<138 copies/mL). A negative result must be combined with clinical observations, patient history, and epidemiological information. The expected result is Negative.  Fact Sheet for Patients:  EntrepreneurPulse.com.au  Fact Sheet for Healthcare Providers:  IncredibleEmployment.be  This test is no t yet approved or cleared by the [place] FDA and  has been authorized for detection and/or diagnosis of SARS-CoV-2 by FDA under an Emergency Use Authorization (EUA). This EUA will remain  in effect (meaning this test can be used) for the duration of the COVID-19 declaration under Section 564(b)(1) of the Act, 21 U.S.C.section 360bbb-3(b)(1), unless the authorization is terminated  or revoked sooner.       Influenza A by PCR NEGATIVE NEGATIVE Final   Influenza B by PCR NEGATIVE NEGATIVE Final    Comment: (NOTE) The Xpert Xpress SARS-CoV-2/FLU/RSV plus assay is intended as an aid in the diagnosis of influenza from Nasopharyngeal swab specimens and should not be used as a sole basis for treatment. Nasal washings and aspirates are unacceptable for Xpert Xpress SARS-CoV-2/FLU/RSV testing.  Fact Sheet for Patients: EntrepreneurPulse.com.au  Fact Sheet for Healthcare Providers: IncredibleEmployment.be  This test is not yet approved or cleared by the [place] FDA and has been authorized for detection and/or diagnosis of SARS-CoV-2 by FDA under  an Emergency Use Authorization (EUA). This EUA will remain in effect (meaning this test can be used) for the duration of the COVID-19 declaration under Section 564(b)(1) of the Act, 21 U.S.C. section 360bbb-3(b)(1), unless the authorization is terminated or revoked.  Performed at [facility], [place] [address] [place], [place] [address]   Blood Culture (routine x 2)     Status: None   Collection Time: 08/01/20 10:40 PM   Specimen: BLOOD  Result Value Ref Range Status   Specimen Description BLOOD SITE NOT SPECIFIED  Final   Special  Requests   Final    BOTTLES DRAWN AEROBIC AND ANAEROBIC Blood Culture adequate volume   Culture   Final    NO GROWTH 5 DAYS Performed at [facility], [place] [address] [place], [place] [address]    Report Status 08/06/2020 FINAL  Final  Blood Culture (routine x 2)     Status: None   Collection Time: 08/01/20 10:40 PM   Specimen: BLOOD  Result Value Ref Range Status   Specimen Description BLOOD SITE NOT SPECIFIED  Final   Special Requests   Final    BOTTLES DRAWN AEROBIC AND ANAEROBIC Blood Culture adequate volume   Culture   Final    NO GROWTH 5 DAYS Performed at [facility], [place] [address] [place], [place] [address]    Report Status 08/06/2020 FINAL  Final  Aerobic/Anaerobic Culture (surgical/deep wound)     Status: None (Preliminary result)   Collection Time: 08/02/20  1:37 AM   Specimen: Abscess  Result Value Ref Range Status   Specimen Description ABSCESS  Final   Special Requests PENILE  Final   Gram Stain   Final    ABUNDANT WBC PRESENT, PREDOMINANTLY PMN ABUNDANT GRAM POSITIVE COCCI RARE GRAM POSITIVE RODS    Culture   Final    FEW PROTEUS MIRABILIS RARE GROUP B STREP(S.AGALACTIAE)ISOLATED TESTING AGAINST S. AGALACTIAE NOT ROUTINELY PERFORMED DUE TO PREDICTABILITY OF AMP/PEN/VAN SUSCEPTIBILITY. HOLDING FOR POSSIBLE ANAEROBE Performed at [facility], [place] [address] [place], [place] [address]    Report Status  PENDING  Incomplete   Organism ID, Bacteria PROTEUS MIRABILIS  Final      Susceptibility   Proteus mirabilis - MIC*    AMPICILLIN >=32 RESISTANT Resistant     CEFAZOLIN 8 SENSITIVE Sensitive     CEFEPIME <=0.12 SENSITIVE Sensitive     CEFTAZIDIME <=1 SENSITIVE Sensitive     CEFTRIAXONE <=0.25 SENSITIVE Sensitive     CIPROFLOXACIN <=0.25 SENSITIVE Sensitive     GENTAMICIN <=1 SENSITIVE Sensitive     IMIPENEM 2 SENSITIVE Sensitive     TRIMETH/SULFA <=20 SENSITIVE Sensitive     AMPICILLIN/SULBACTAM >=32 RESISTANT Resistant     PIP/TAZO <=4 SENSITIVE Sensitive     * FEW PROTEUS MIRABILIS  MRSA PCR Screening     Status: None   Collection Time: 08/02/20  9:43 AM   Specimen: Nasal Mucosa; Nasopharyngeal  Result Value Ref Range Status   MRSA by PCR NEGATIVE NEGATIVE Final    Comment:        The GeneXpert MRSA Assay (FDA approved for NASAL specimens only), is one component of a comprehensive MRSA colonization surveillance program. It is not intended to diagnose MRSA infection nor to guide or monitor treatment for MRSA infections. Performed at [facility], [place] [address] [place], [place] [address]   Surgical pcr screen     Status: None   Collection Time: 08/05/20  5:08 AM   Specimen: Nasal Mucosa; Nasal Swab  Result Value Ref Range Status   MRSA, PCR NEGATIVE NEGATIVE Final   Staphylococcus aureus NEGATIVE NEGATIVE Final    Comment: (NOTE) The Xpert SA Assay (FDA approved for NASAL specimens in patients 51 years of age and older), is one component of a comprehensive surveillance program. It is not intended to diagnose infection nor to guide or monitor treatment. Performed at [facility], [place] [address], [place], [place] [address]          Radiology Studies: ECHOCARDIOGRAM COMPLETE  Result  Date: 08/04/2020    ECHOCARDIOGRAM REPORT   Patient Name:   NEWT LEVINGSTON [facility] Date of Exam: 08/04/2020 Medical Rec #:  [id number]           Height:       68.0 in  Accession #:    [id number]          Weight:       196.4 lb Date of Birth:  01/20/1971           BSA:          2.028 m Patient Age:    49 years            BP:           98/74 mmHg Patient Gender: M                   HR:           87 bpm. Exam Location:  Inpatient Procedure: 2D Echo Indications:    atrial fibrillation  History:        Patient has no prior history of Echocardiogram examinations.                 Chronic kidney disease. HIV., Arrythmias:Atrial Fibrillation;                 Risk Factors:Dyslipidemia and Diabetes.  Sonographer:    Delcie Roch Referring Phys: [id number] SUDHAM CHAND IMPRESSIONS  1. Left ventricular ejection fraction, by estimation, is 40 to 45%. The left ventricle has mildly decreased function. The left ventricle demonstrates global hypokinesis. Left ventricular diastolic function could not be evaluated.  2. Right ventricular systolic function is mildly reduced. The right ventricular size is mildly enlarged. There is mildly elevated pulmonary artery systolic pressure. The estimated right ventricular systolic pressure is 42.5 mmHg.  3. Left atrial size was mildly dilated.  4. The mitral valve is grossly normal. Trivial mitral valve regurgitation. No evidence of mitral stenosis.  5. The aortic valve is tricuspid. Aortic valve regurgitation is not visualized. No aortic stenosis is present.  6. The inferior vena cava is dilated in size with <50% respiratory variability, suggesting right atrial pressure of 15 mmHg. FINDINGS  Left Ventricle: Left ventricular ejection fraction, by estimation, is 40 to 45%. The left ventricle has mildly decreased function. The left ventricle demonstrates global hypokinesis. The left ventricular internal cavity size was normal in size. There is  no left ventricular hypertrophy. Left ventricular diastolic function could not be evaluated due to atrial fibrillation. Left ventricular diastolic function could not be evaluated. Right Ventricle: The right ventricular  size is mildly enlarged. No increase in right ventricular wall thickness. Right ventricular systolic function is mildly reduced. There is mildly elevated pulmonary artery systolic pressure. The tricuspid regurgitant  velocity is 2.62 m/s, and with an assumed right atrial pressure of 15 mmHg, the estimated right ventricular systolic pressure is 42.5 mmHg. Left Atrium: Left atrial size was mildly dilated. Right Atrium: Right atrial size was normal in size. Pericardium: Trivial pericardial effusion is present. Mitral Valve: The mitral valve is grossly normal. Trivial mitral valve regurgitation. No evidence of mitral valve stenosis. Tricuspid Valve: The tricuspid valve is grossly normal. Tricuspid valve regurgitation is mild . No evidence of tricuspid stenosis. Aortic Valve: The aortic valve is tricuspid. Aortic valve regurgitation is not visualized. No aortic stenosis is present. Pulmonic Valve: The pulmonic valve was grossly normal. Pulmonic valve regurgitation is trivial. No evidence of pulmonic stenosis. Aorta: The aortic root and  ascending aorta are structurally normal, with no evidence of dilitation. Venous: The inferior vena cava is dilated in size with less than 50% respiratory variability, suggesting right atrial pressure of 15 mmHg. IAS/Shunts: The atrial septum is grossly normal.  LEFT VENTRICLE PLAX 2D LVIDd:         5.10 cm LVIDs:         4.30 cm LV PW:         1.10 cm LV IVS:        0.90 cm LVOT diam:     2.30 cm LV SV:         37 LV SV Index:   18 LVOT Area:     4.15 cm  RIGHT VENTRICLE            IVC RV S prime:     9.46 cm/s  IVC diam: 2.50 cm TAPSE (M-mode): 1.5 cm LEFT ATRIUM             Index       RIGHT ATRIUM           Index LA diam:        5.10 cm 2.51 cm/m  RA Area:     17.60 cm LA Vol (A2C):   66.1 ml 32.59 ml/m RA Volume:   44.00 ml  21.69 ml/m LA Vol (A4C):   76.9 ml 37.92 ml/m LA Biplane Vol: 75.2 ml 37.08 ml/m  AORTIC VALVE LVOT Vmax:   55.50 cm/s LVOT Vmean:  35.900 cm/s LVOT VTI:     0.088 m  AORTA Ao Root diam: 3.50 cm Ao Asc diam:  3.60 cm TRICUSPID VALVE TR Peak grad:   27.5 mmHg TR Vmax:        262.00 cm/s  SHUNTS Systemic VTI:  0.09 m Systemic Diam: 2.30 cm [name] Electronically signed by [name] Signature Date/Time: 08/04/2020/5:22:46 PM    Final         Scheduled Meds: . sodium chloride   Intravenous Once  . atorvastatin  40 mg Oral Daily  . Chlorhexidine Gluconate Cloth  6 each Topical Daily  . dolutegravir  50 mg Oral Daily  . emtricitabine-tenofovir AF  1 tablet Oral QHS  . insulin aspart  0-15 Units Subcutaneous TID WC  . insulin aspart  0-5 Units Subcutaneous QHS  . insulin glargine  15 Units Subcutaneous Daily  . metoprolol tartrate  100 mg Oral BID  . sertraline  100 mg Oral BID   Continuous Infusions: . piperacillin-tazobactam (ZOSYN)  IV 3.375 g (08/06/20 1119)     LOS: 4 days     Georgette Shell, MD  08/06/2020, 1:41 PM

## 2020-08-06 NOTE — Procedures (Signed)
Interventional Radiology Procedure Note  Procedure: Suprapubic catheter placement  Findings: Please refer to procedural dictation for full description. Fluoroscopic and ultrasound guided placement of 14 Fr pigtail suprapubic tube.    Complications: None immediate  Estimated Blood Loss: < 5 mL  Recommendations: Keep to bag drainage. Consider upsize to 16 Fr in 1 month - long term plan as per Urology. IR will follow.   Marliss Coots, MD Pager: [phone]

## 2020-08-06 NOTE — Care Management Important Message (Signed)
Important Message  Patient Details  Name: Randle Shatzer MRN: [id number] Date of Birth: Jan 04, 1971   Medicare Important Message Given:  Yes     Renie Ora 08/06/2020, 10:40 AM

## 2020-08-07 ENCOUNTER — Encounter (HOSPITAL_COMMUNITY): Payer: Self-pay

## 2020-08-07 DIAGNOSIS — R000 Tachycardia, unspecified: Secondary | ICD-10-CM | POA: Diagnosis not present

## 2020-08-07 DIAGNOSIS — N4821 Abscess of corpus cavernosum and penis: Secondary | ICD-10-CM | POA: Diagnosis not present

## 2020-08-07 DIAGNOSIS — N493 Fournier gangrene: Secondary | ICD-10-CM | POA: Diagnosis not present

## 2020-08-07 LAB — COMPREHENSIVE METABOLIC PANEL
ALT: 11 U/L (ref 0–44)
AST: 14 U/L — ABNORMAL LOW (ref 15–41)
Albumin: 1.3 g/dL — ABNORMAL LOW (ref 3.5–5.0)
Alkaline Phosphatase: 67 U/L (ref 38–126)
Anion gap: 7 (ref 5–15)
BUN: 35 mg/dL — ABNORMAL HIGH (ref 6–20)
CO2: 23 mmol/L (ref 22–32)
Calcium: 7.3 mg/dL — ABNORMAL LOW (ref 8.9–10.3)
Chloride: 108 mmol/L (ref 98–111)
Creatinine, Ser: 2.2 mg/dL — ABNORMAL HIGH (ref 0.61–1.24)
GFR, Estimated: 36 mL/min — ABNORMAL LOW (ref >60)
Glucose, Bld: 191 mg/dL — ABNORMAL HIGH (ref 70–99)
Potassium: 4.6 mmol/L (ref 3.5–5.1)
Sodium: 138 mmol/L (ref 135–145)
Total Bilirubin: 0.4 mg/dL (ref 0.3–1.2)
Total Protein: 5.3 g/dL — ABNORMAL LOW (ref 6.5–8.1)

## 2020-08-07 LAB — CBC
HCT: 24.6 % — ABNORMAL LOW (ref 39.0–52.0)
Hemoglobin: 7.7 g/dL — ABNORMAL LOW (ref 13.0–17.0)
MCH: 27.9 pg (ref 26.0–34.0)
MCHC: 31.3 g/dL (ref 30.0–36.0)
MCV: 89.1 fL (ref 80.0–100.0)
Platelets: 375 10*3/uL (ref 150–400)
RBC: 2.76 MIL/uL — ABNORMAL LOW (ref 4.22–5.81)
RDW: 18 % — ABNORMAL HIGH (ref 11.5–15.5)
WBC: 14.5 10*3/uL — ABNORMAL HIGH (ref 4.0–10.5)
nRBC: 0 % (ref 0.0–0.2)

## 2020-08-07 LAB — GLUCOSE, CAPILLARY
Glucose-Capillary: 220 mg/dL — ABNORMAL HIGH (ref 70–99)
Glucose-Capillary: 170 mg/dL — ABNORMAL HIGH (ref 70–99)
Glucose-Capillary: 226 mg/dL — ABNORMAL HIGH (ref 70–99)
Glucose-Capillary: 195 mg/dL — ABNORMAL HIGH (ref 70–99)

## 2020-08-07 LAB — AEROBIC/ANAEROBIC CULTURE W GRAM STAIN (SURGICAL/DEEP WOUND)

## 2020-08-07 MED ORDER — SODIUM CHLORIDE 0.9 % IV SOLN: INTRAVENOUS | Status: DC

## 2020-08-07 MED ORDER — INSULIN ASPART 100 UNIT/ML ~~LOC~~ SOLN
0.0000 [IU] | Freq: Every day | SUBCUTANEOUS | Status: DC
  Administered 2020-08-08 – 2020-08-13 (×2): 2 [IU] via SUBCUTANEOUS

## 2020-08-07 MED ORDER — INSULIN ASPART 100 UNIT/ML ~~LOC~~ SOLN
0.0000 [IU] | Freq: Three times a day (TID) | SUBCUTANEOUS | Status: DC
  Administered 2020-08-07 – 2020-08-08 (×2): 3 [IU] via SUBCUTANEOUS
  Administered 2020-08-08: 10:00:00 1 [IU] via SUBCUTANEOUS
  Administered 2020-08-08: 16:00:00 3 [IU] via SUBCUTANEOUS
  Administered 2020-08-09: 18:00:00 2 [IU] via SUBCUTANEOUS
  Administered 2020-08-09: 13:00:00 3 [IU] via SUBCUTANEOUS
  Administered 2020-08-09: 09:00:00 1 [IU] via SUBCUTANEOUS
  Administered 2020-08-10: 2 [IU] via SUBCUTANEOUS
  Administered 2020-08-10 – 2020-08-11 (×2): 1 [IU] via SUBCUTANEOUS
  Administered 2020-08-11 – 2020-08-13 (×5): 2 [IU] via SUBCUTANEOUS
  Administered 2020-08-14: 3 [IU] via SUBCUTANEOUS
  Administered 2020-08-14 (×2): 2 [IU] via SUBCUTANEOUS
  Administered 2020-08-15: 1 [IU] via SUBCUTANEOUS
  Administered 2020-08-15: 2 [IU] via SUBCUTANEOUS

## 2020-08-07 MED ORDER — SODIUM CHLORIDE 0.9% FLUSH
5.0000 mL | Freq: Three times a day (TID) | INTRAVENOUS | Status: DC
  Administered 2020-08-08 – 2020-08-15 (×17): 5 mL

## 2020-08-07 NOTE — Progress Notes (Signed)
Inpatient Diabetes Program Recommendations  AACE/ADA: New Consensus Statement on Inpatient Glycemic Control (2015)  Target Ranges:  Prepandial:   less than 140 mg/dL      Peak postprandial:   less than 180 mg/dL (1-2 hours)      Critically ill patients:  140 - 180 mg/dL   Lab Results  Component Value Date   GLUCAP 170 (H) 08/07/2020   HGBA1C 8.8 (H) 08/04/2020    Review of Glycemic Control Results for INMER, NIX" (MRN [id number]) as of 08/07/2020 12:55  Ref. Range 08/06/2020 11:22 08/06/2020 16:56 08/06/2020 20:36 08/07/2020 08:04 08/07/2020 11:31  Glucose-Capillary Latest Ref Range: 70 - 99 mg/dL 165 (H) Novolog 3 units 64 (L) 125 (H) 220 (H) Novolog 5 units 170 (H) Novolog 3 units   Inpatient Diabetes Program Recommendations:   Patient had hypoglycemia post correction. Please consider  -Decrease Novolog correction to sensitive 0-9 units tid + hs 0-5 units Secure chat sent to Dr. Marland Mcalpine.  Thank you, Francisco Hutchinson. Francisco Durkee, RN, MSN, CDE  Diabetes Coordinator Inpatient Glycemic Control Team Team Pager [phone] (8am-5pm) 08/07/2020 12:57 PM

## 2020-08-07 NOTE — Progress Notes (Signed)
Reviewed chart Changed dressing with nursing wet to dry and now daily order- granulation tissue looks great penis and s/p and perineal Will transfer later in week to Dr Guy Sandifer internist to internist

## 2020-08-07 NOTE — Progress Notes (Signed)
PROGRESS NOTE    Francisco Hutchinson  [id number] DOB: Oct 22, 1970 DOA: 08/01/2020 PCP: [name], MD   Brief Narrative: The patient is a 49 year old nursing home resident with a past medical history significant for but not limited to hypertension, HIV, CVA, diabetes mellitus type 2, anxiety and depression, as well as other comorbidities who presented to the emergency room with septic shock due to Fournier's gangrene neurology is consulted and patient was taken urgently to the OR for incision and drainage.  He was then taken back to the OR on 08/05/2020 for a washout and debridement of the penile and perineal abscess at the patient has had necrotizing fasciitis of the penis and perineum.  He was transferred to [facility] service 08/05/2020.  Wound culture wound 40 is in blood cultures have been negative.  MRSA PCR was negative and Covid was negative as well as flu.  He was started on IV Zosyn and changed to IV Ceftriaxone.  He underwent a suprapubic catheter placement on 08/06/2020 by interventional radiology  Assessment & Plan:   Active Problems:   Fourniers gangrene   Penile abscess   Pressure injury of skin   Hypotension   Tachycardia   Fournier's Gangrene and necrotizing fasciitis of the penis and perineum  -Presented with septic shock and is admitted to [facility] and and transferred to [facility] service 08/05/2020 -He is status post debridement twice by Urology.  Culture growing Proteus so will continue IV Zosyn but chjanged to IV Ceftriaxone.   -Patient scheduled to have suprapubic catheter placed by interventional radiology and this was done 08/06/20 -Patient afebrile, WBC was trending down from Admission (27.5) and has now stabilized and has now gone from 14.4 -> 14.5 -Urology recommending changing the dressings wet-to-dry daily as his granulation tissue looks fairly well.  Urology recommending transferred to [facility] later this week to the care of Dr. Francesca Jewett for reconstruction  HIV  -C/w  Dolutegravir 50 mg po daily and Emticitabine-Tenofovir 200-25 mg po daily  Hx of CVA -Was on ASA pta and has ben held -C/w Atorvastatin 40 mg po Daily   Uncontrolled Diabetes Mellitus Type 2 -C/w Lantus 15 units sq daily -C/w Sensitive Novolog SSI AC -CBG's ranging from 64-226 -HbA1c is 8.8 -Continue to Monitor Blood Sugars carefully and adjust insulin as Necessary  Anxiety and Depression -C/w Sertraline 100 mg po BID  Hyponatremia -Improved. Na+ is now 138 -Continue to Monitor and Trend -Repeat CMP in the AM   HTN -C/w Metoprolol 100 mg po BID  HLD -C/w Atorvastatin   AKI on CKD Stage 3a -Patient's BUN/Cr went from 28/1.37 -> 28/1.56 -> 29/1.84 -> 36/2.42 -> 35/2.20 -Start IVF with NS at 75 mL/hr -Avoid Nephrotoxic Medications, Contrast Dyes, Hypotension and Renally adjust Medications -Continue to Monitor and Trend Renal Fxn and Repeat CMP in the AM   Normocytic Anemia/Anemia of Chronic Disease -Patient's Hgb/Hct went from 8.7/28.1 -> 7.0/20.8 -> 6.7/20.1 -> 8.1/24.3 -> 7.7/24.6 -Check Anemia Panel in the AM -Continue to Monitor for S/Sx of Bleeding; Currently no overt bleeding noted -Repeat CBC   Pressure Injuries, poA Pressure Injury 03/24/17 Stage II -  Partial thickness loss of dermis presenting as a shallow open ulcer with a red, pink wound bed without slough. (Active)  03/24/17 1131  Location: Elbow  Location Orientation: Right  Staging: Stage II -  Partial thickness loss of dermis presenting as a shallow open ulcer with a red, pink wound bed without slough.  Wound Description (Comments):   Present on Admission:  Yes     Pressure Injury 08/05/20 Hip Anterior;Left Stage 4 - Full thickness tissue loss with exposed bone, tendon or muscle. (Active)  08/05/20 2000  Location: Hip  Location Orientation: Anterior;Left  Staging: Stage 4 - Full thickness tissue loss with exposed bone, tendon or muscle.  Wound Description (Comments):   Present on Admission: Yes      Pressure Injury 08/05/20 Heel Left Deep Tissue Pressure Injury - Purple or maroon localized area of discolored intact skin or blood-filled blister due to damage of underlying soft tissue from pressure and/or shear. (Active)  08/05/20 2000  Location: Heel  Location Orientation: Left  Staging: Deep Tissue Pressure Injury - Purple or maroon localized area of discolored intact skin or blood-filled blister due to damage of underlying soft tissue from pressure and/or shear.  Wound Description (Comments):   Present on Admission: Yes     Pressure Injury 08/07/20 Sacrum Mid;Lower Stage 2 -  Partial thickness loss of dermis presenting as a shallow open injury with a red, pink wound bed without slough. (Active)  08/07/20 0800  Location: Sacrum  Location Orientation: Mid;Lower  Staging: Stage 2 -  Partial thickness loss of dermis presenting as a shallow open injury with a red, pink wound bed without slough.  Wound Description (Comments):   Present on Admission:    Obesity -Complicates overall prognosis and care -Estimated body mass index is 30.97 kg/m as calculated from the following:   Height as of this encounter: 5\' 8"  (1.727 m).   Weight as of this encounter: 92.4 kg. -Weight Loss and Dietary Counseling given  DVT prophylaxis: SCDs; Has a Low Hgb but will continue to Monitor and likely start Heparin 5,000 units Code Status: FULL CODE  Family Communication: No family present at bedside  Disposition Plan: Likely Transfer [facility] later this week  Status is: Inpatient  Remains inpatient appropriate because:Unsafe d/c plan, IV treatments appropriate due to intensity of illness or inability to take PO and Inpatient level of care appropriate due to severity of illness   Dispo: The patient is from: Home              Anticipated d/c is to: SNF              Anticipated d/c date is: > 3 days              Patient currently is not medically stable to d/c.  Consultants:   PCCM  Transfer  Urology  Interventional Radiology    Procedures:  Procedure(s): 1.  Washout and debridement of penile and perineal abscess  Antimicrobials:  Anti-infectives (From admission, onward)   Start     Dose/Rate Route Frequency Ordered Stop   08/06/20 1530  cefTRIAXone (ROCEPHIN) 2 g in sodium chloride 0.9 % 100 mL IVPB        2 g 200 mL/hr over 30 Minutes Intravenous Every 24 hours 08/06/20 1432     08/04/20 2200  emtricitabine-tenofovir AF (DESCOVY) 200-25 MG per tablet 1 tablet        1 tablet Oral Daily at bedtime 08/04/20 0926     08/04/20 1015  dolutegravir (TIVICAY) tablet 50 mg        50 mg Oral Daily 08/04/20 0926     08/03/20 1800  piperacillin-tazobactam (ZOSYN) IVPB 3.375 g  Status:  Discontinued        3.375 g 12.5 mL/hr over 240 Minutes Intravenous Every 8 hours 08/03/20 1719 08/06/20 1432   08/02/20 1000  vancomycin (VANCOCIN) IVPB  1000 mg/200 mL premix  Status:  Discontinued        1,000 mg 200 mL/hr over 60 Minutes Intravenous Every 12 hours 08/02/20 0354 08/04/20 0924   08/02/20 0600  ceFEPIme (MAXIPIME) 2 g in sodium chloride 0.9 % 100 mL IVPB  Status:  Discontinued        2 g 200 mL/hr over 30 Minutes Intravenous Every 8 hours 08/01/20 2148 08/02/20 0345   08/02/20 0600  piperacillin-tazobactam (ZOSYN) IVPB 3.375 g  Status:  Discontinued        3.375 g 12.5 mL/hr over 240 Minutes Intravenous Every 8 hours 08/02/20 0328 08/03/20 1719   08/01/20 2230  vancomycin (VANCOCIN) IVPB 1000 mg/200 mL premix        1,000 mg 200 mL/hr over 60 Minutes Intravenous  Once 08/01/20 2225 08/02/20 0028   08/01/20 2230  clindamycin (CLEOCIN) IVPB 600 mg        600 mg 100 mL/hr over 30 Minutes Intravenous  Once 08/01/20 2225 08/01/20 2320   08/01/20 2045  ceFEPIme (MAXIPIME) 2 g in sodium chloride 0.9 % 100 mL IVPB        2 g 200 mL/hr over 30 Minutes Intravenous  Once 08/01/20 2041 08/01/20 2249       Subjective: And examined at bedside and he was getting his perineal  changed by urology.  He denies any nausea or vomiting but did have some pain while his dressings were changed.  He denies any lightheadedness or dizziness.  Felt okay.  No chest pain or shortness breath.  No other concerns or complaints at this time.  Objective: Vitals:   08/07/20 0807 08/07/20 0847 08/07/20 1133 08/07/20 1706  BP: 107/78 113/85 113/87 103/76  Pulse: (!) 107 (!) 104 (!) 101 93  Resp: 18 18 18 18   Temp: 98.7 F (37.1 C) 98.5 F (36.9 C)  98.5 F (36.9 C)  TempSrc: Oral Oral Oral Oral  SpO2: 98% 98% 95% 96%  Weight:      Height:        Intake/Output Summary (Last 24 hours) at 08/07/2020 1712 Last data filed at 08/07/2020 1200 Gross per 24 hour  Intake 600 ml  Output 590 ml  Net 10 ml   Filed Weights   08/05/20 0503 08/06/20 0514 08/07/20 0500  Weight: 94.5 kg 93.3 kg 92.4 kg   Examination: Physical Exam:  Constitutional: WN/WD obese Caucasian male currently in mild distress but does appear little uncomfortable getting his dressing changed. Eyes: Lids and conjunctivae normal, sclerae anicteric  ENMT: External Ears, Nose appear normal. Grossly normal hearing. Neck: Appears normal, supple, no cervical masses, normal ROM Respiratory: Clear to auscultation bilaterally, no wheezing, rales, rhonchi or crackles. Normal respiratory effort and patient is not tachypenic. No accessory muscle use.  Cardiovascular: RRR, no murmurs / rubs / gallops. S1 and S2 auscultated. Abdomen: Soft, non-tender, distended secondary to body habitus. Bowel sounds positive.  GU: Has a flayed meatus and a suprapubic pubic catheter and changes from his incision and debridement Musculoskeletal: No clubbing / cyanosis of digits/nails.  Has a right BKA Skin: Has multiple pressure ulcers noted and is no induration; Warm and dry.  Neurologic: CN 2-12 grossly intact with no focal deficits. Romberg sign and cerebellar reflexes not assessed.  Psychiatric: Normal judgment and insight. Alert and  oriented x 3. Normal mood and appropriate affect.   Data Reviewed: I have personally reviewed following labs and imaging studies  CBC: Recent Labs  Lab 08/01/20 1528 08/03/20 0056 08/04/20 0052  08/06/20 0237 08/07/20 0144  WBC 27.5* 21.0* 21.5* 14.4* 14.5*  NEUTROABS 25.6*  --   --   --   --   HGB 8.7* 7.0* 6.7* 8.1* 7.7*  HCT 28.1* 20.8* 20.1* 24.3* 24.6*  MCV 88.1 82.5 84.5 85.9 89.1  PLT 449* 347 342 410* 375   Basic Metabolic Panel: Recent Labs  Lab 08/02/20 0609 08/03/20 0056 08/04/20 0052 08/06/20 0237 08/07/20 0144  NA 132* 131* 131* 135 138  K 3.1* 3.2* 4.2 4.7 4.6  CL 101 101 101 104 108  CO2 22 23 22 22 23   GLUCOSE 95 160* 206* 232* 191*  BUN 28* 28* 29* 36* 35*  CREATININE 1.37* 1.56* 1.84* 2.42* 2.20*  CALCIUM 7.2* 7.2* 7.3* 7.4* 7.3*  MG 1.8 1.9 2.4  --   --   PHOS 2.4* 3.6 3.6  --   --    GFR: Estimated Creatinine Clearance: 45.8 mL/min (A) (by C-G formula based on SCr of 2.2 mg/dL (H)). Liver Function Tests: Recent Labs  Lab 08/01/20 1528 08/02/20 0609 08/03/20 0056 08/06/20 0237 08/07/20 0144  AST 17 17 16  13* 14*  ALT 12 11 12 13 11   ALKPHOS 103 73 74 75 67  BILITOT 0.8 0.7 0.9 0.2* 0.4  PROT 6.2* 5.5* 5.2* 5.5* 5.3*  ALBUMIN 1.7* 1.7* 1.5* 1.4* 1.3*   No results for input(s): LIPASE, AMYLASE in the last 168 hours. No results for input(s): AMMONIA in the last 168 hours. Coagulation Profile: Recent Labs  Lab 08/02/20 1047  INR 1.5*   Cardiac Enzymes: No results for input(s): CKTOTAL, CKMB, CKMBINDEX, TROPONINI in the last 168 hours. BNP (last 3 results) No results for input(s): PROBNP in the last 8760 hours. HbA1C: No results for input(s): HGBA1C in the last 72 hours. CBG: Recent Labs  Lab 08/06/20 1122 08/06/20 1656 08/06/20 2036 08/07/20 0804 08/07/20 1131  GLUCAP 159* 64* 125* 220* 170*   Lipid Profile: No results for input(s): CHOL, HDL, LDLCALC, TRIG, CHOLHDL, LDLDIRECT in the last 72 hours. Thyroid Function  Tests: No results for input(s): TSH, T4TOTAL, FREET4, T3FREE, THYROIDAB in the last 72 hours. Anemia Panel: No results for input(s): VITAMINB12, FOLATE, FERRITIN, TIBC, IRON, RETICCTPCT in the last 72 hours. Sepsis Labs: Recent Labs  Lab 08/01/20 1530 08/01/20 2033 08/02/20 0609 08/02/20 0612 08/02/20 1047  PROCALCITON  --   --  0.40  --   --   LATICACIDVEN 2.3* 1.2  --  0.8 0.9    Recent Results (from the past 240 hour(s))  Resp Panel by RT-PCR (Flu A&B, Covid) Nasopharyngeal Swab     Status: None   Collection Time: 08/01/20 10:15 PM   Specimen: Nasopharyngeal Swab; Nasopharyngeal(NP) swabs in vial transport medium  Result Value Ref Range Status   SARS Coronavirus 2 by RT PCR NEGATIVE NEGATIVE Final    Comment: (NOTE) SARS-CoV-2 target nucleic acids are NOT DETECTED.  The SARS-CoV-2 RNA is generally detectable in upper respiratory specimens during the acute phase of infection. The lowest concentration of SARS-CoV-2 viral copies this assay can detect is 138 copies/mL. A negative result does not preclude SARS-Cov-2 infection and should not be used as the sole basis for treatment or other patient management decisions. A negative result may occur with  improper specimen collection/handling, submission of specimen other than nasopharyngeal swab, presence of viral mutation(s) within the areas targeted by this assay, and inadequate number of viral copies(<138 copies/mL). A negative result must be combined with clinical observations, patient history, and epidemiological information. The expected  result is Negative.  Fact Sheet for Patients:  EntrepreneurPulse.com.au  Fact Sheet for Healthcare Providers:  IncredibleEmployment.be  This test is no t yet approved or cleared by the [place] FDA and  has been authorized for detection and/or diagnosis of SARS-CoV-2 by FDA under an Emergency Use Authorization (EUA). This EUA will remain  in  effect (meaning this test can be used) for the duration of the COVID-19 declaration under Section 564(b)(1) of the Act, 21 U.S.C.section 360bbb-3(b)(1), unless the authorization is terminated  or revoked sooner.       Influenza A by PCR NEGATIVE NEGATIVE Final   Influenza B by PCR NEGATIVE NEGATIVE Final    Comment: (NOTE) The Xpert Xpress SARS-CoV-2/FLU/RSV plus assay is intended as an aid in the diagnosis of influenza from Nasopharyngeal swab specimens and should not be used as a sole basis for treatment. Nasal washings and aspirates are unacceptable for Xpert Xpress SARS-CoV-2/FLU/RSV testing.  Fact Sheet for Patients: EntrepreneurPulse.com.au  Fact Sheet for Healthcare Providers: IncredibleEmployment.be  This test is not yet approved or cleared by the [place] FDA and has been authorized for detection and/or diagnosis of SARS-CoV-2 by FDA under an Emergency Use Authorization (EUA). This EUA will remain in effect (meaning this test can be used) for the duration of the COVID-19 declaration under Section 564(b)(1) of the Act, 21 U.S.C. section 360bbb-3(b)(1), unless the authorization is terminated or revoked.  Performed at [facility], [place] [address], [place], [place] [address]   Blood Culture (routine x 2)     Status: None   Collection Time: 08/01/20 10:40 PM   Specimen: BLOOD  Result Value Ref Range Status   Specimen Description BLOOD SITE NOT SPECIFIED  Final   Special Requests   Final    BOTTLES DRAWN AEROBIC AND ANAEROBIC Blood Culture adequate volume   Culture   Final    NO GROWTH 5 DAYS Performed at [facility], [address], [place], [place] [address]    Report Status 08/06/2020 FINAL  Final  Blood Culture (routine x 2)     Status: None   Collection Time: 08/01/20 10:40 PM   Specimen: BLOOD  Result Value Ref Range Status   Specimen Description BLOOD SITE NOT SPECIFIED  Final   Special Requests   Final     BOTTLES DRAWN AEROBIC AND ANAEROBIC Blood Culture adequate volume   Culture   Final    NO GROWTH 5 DAYS Performed at [facility], [place] [address] [place], [place] [address]    Report Status 08/06/2020 FINAL  Final  Aerobic/Anaerobic Culture (surgical/deep wound)     Status: None   Collection Time: 08/02/20  1:37 AM   Specimen: Abscess  Result Value Ref Range Status   Specimen Description ABSCESS  Final   Special Requests PENILE  Final   Gram Stain   Final    ABUNDANT WBC PRESENT, PREDOMINANTLY PMN ABUNDANT GRAM POSITIVE COCCI RARE GRAM POSITIVE RODS    Culture   Final    FEW PROTEUS MIRABILIS RARE GROUP B STREP(S.AGALACTIAE)ISOLATED TESTING AGAINST S. AGALACTIAE NOT ROUTINELY PERFORMED DUE TO PREDICTABILITY OF AMP/PEN/VAN SUSCEPTIBILITY. FEW PREVOTELLA SPECIES BETA LACTAMASE POSITIVE Performed at [facility], [place] [address], [place], [place] [address]    Report Status 08/07/2020 FINAL  Final   Organism ID, Bacteria PROTEUS MIRABILIS  Final      Susceptibility   Proteus mirabilis - MIC*    AMPICILLIN >=32 RESISTANT Resistant     CEFAZOLIN  8 SENSITIVE Sensitive     CEFEPIME <=0.12 SENSITIVE Sensitive     CEFTAZIDIME <=1 SENSITIVE Sensitive     CEFTRIAXONE <=0.25 SENSITIVE Sensitive     CIPROFLOXACIN <=0.25 SENSITIVE Sensitive     GENTAMICIN <=1 SENSITIVE Sensitive     IMIPENEM 2 SENSITIVE Sensitive     TRIMETH/SULFA <=20 SENSITIVE Sensitive     AMPICILLIN/SULBACTAM >=32 RESISTANT Resistant     PIP/TAZO <=4 SENSITIVE Sensitive     * FEW PROTEUS MIRABILIS  MRSA PCR Screening     Status: None   Collection Time: 08/02/20  9:43 AM   Specimen: Nasal Mucosa; Nasopharyngeal  Result Value Ref Range Status   MRSA by PCR NEGATIVE NEGATIVE Final    Comment:        The GeneXpert MRSA Assay (FDA approved for NASAL specimens only), is one component of a comprehensive MRSA colonization surveillance program. It is not intended to diagnose MRSA infection nor to  guide or monitor treatment for MRSA infections. Performed at [facility], [place] [address], [place], [place] [address]   Surgical pcr screen     Status: None   Collection Time: 08/05/20  5:08 AM   Specimen: Nasal Mucosa; Nasal Swab  Result Value Ref Range Status   MRSA, PCR NEGATIVE NEGATIVE Final   Staphylococcus aureus NEGATIVE NEGATIVE Final    Comment: (NOTE) The Xpert SA Assay (FDA approved for NASAL specimens in patients 76 years of age and older), is one component of a comprehensive surveillance program. It is not intended to diagnose infection nor to guide or monitor treatment. Performed at [facility], [place] [address], [place], [place] [address]      RN Pressure Injury Documentation: Pressure Injury 03/24/17 Stage II -  Partial thickness loss of dermis presenting as a shallow open ulcer with a red, pink wound bed without slough. (Active)  03/24/17 1131  Location: Elbow  Location Orientation: Right  Staging: Stage II -  Partial thickness loss of dermis presenting as a shallow open ulcer with a red, pink wound bed without slough.  Wound Description (Comments):   Present on Admission: Yes     Pressure Injury 08/05/20 Hip Anterior;Left Stage 4 - Full thickness tissue loss with exposed bone, tendon or muscle. (Active)  08/05/20 2000  Location: Hip  Location Orientation: Anterior;Left  Staging: Stage 4 - Full thickness tissue loss with exposed bone, tendon or muscle.  Wound Description (Comments):   Present on Admission: Yes     Pressure Injury 08/05/20 Heel Left Deep Tissue Pressure Injury - Purple or maroon localized area of discolored intact skin or blood-filled blister due to damage of underlying soft tissue from pressure and/or shear. (Active)  08/05/20 2000  Location: Heel  Location Orientation: Left  Staging: Deep Tissue Pressure Injury - Purple or maroon localized area of discolored intact skin or blood-filled blister due to damage of underlying soft  tissue from pressure and/or shear.  Wound Description (Comments):   Present on Admission: Yes     Pressure Injury 08/07/20 Sacrum Mid;Lower Stage 2 -  Partial thickness loss of dermis presenting as a shallow open injury with a red, pink wound bed without slough. (Active)  08/07/20 0800  Location: Sacrum  Location Orientation: Mid;Lower  Staging: Stage 2 -  Partial thickness loss of dermis presenting as a shallow open injury with a red, pink wound bed without slough.  Wound Description (Comments):   Present on Admission:      Estimated body mass index is  30.97 kg/m as calculated from the following:   Height as of this encounter: 5\' 8"  (1.727 m).   Weight as of this encounter: 92.4 kg.  Malnutrition Type:  Nutrition Problem: Increased nutrient needs Etiology: wound healing   Malnutrition Characteristics:  Signs/Symptoms: estimated needs   Nutrition Interventions:  Interventions: Ensure Enlive (each supplement provides 350kcal and 20 grams of protein),MVI     Radiology Studies: IR US Guidance  Result Date: 08/06/2020 INDICATION: 49 year old male with Fournier's gangrene requiring suprapubic urinary bladder divergence. EXAM: ULTRASOUND AND fluoroscopic GUIDED SUPRAPUBIC CATHETER PLACEMENT COMPARISON:  CT abdomen pelvis from 08/01/2020 MEDICATIONS: The patient was receiving antibiotics as an inpatient which were continued through the procedure. ANESTHESIA/SEDATION: Moderate (conscious) sedation was employed during this procedure. A total of Versed 1 mg and Fentanyl 50 mcg was administered intravenously. Moderate Sedation Time: 16 minutes. The patient's level of consciousness and vital signs were monitored continuously by radiology nursing throughout the procedure under my direct supervision. CONTRAST:  None COMPLICATIONS: None immediate. PROCEDURE: The procedure, risks, benefits, and alternatives were explained to the patient. Questions regarding the procedure were encouraged and  answered. The patient understands and consents to the procedure. A timeout was performed prior to the initiation of the procedure. The patient was positioned supine on the fluoro table. Preprocedural imaging was obtained of the lower pelvis. The skin overlying the anterior aspect the lower pelvis was prepped and draped in usual sterile fashion. Under direct ultrasound guidance, a 22 gauge needle was utilized for the purposes of procedural planning after the overlying soft tissues were anesthetized with 1% lidocaine. Next, an 49 gauge trocar needle was advanced into the urinary bladder under direct ultrasound guidance. Ultrasound image was saved for procedural documentation purposes. A short Amplatz wire was coiled within the urinary bladder. Appropriate position was confirmed with fluoroscopy. The track was serially dilated ultimately allowing placement of a 14 French all-purpose drainage catheter with end coiled and locked within the urinary bladder. Contrast was injected to confirm placement within the urinary bladder. The catheter was connected to a gravity bag. The exit site of the catheter was secured within interrupted suture. A dressing was placed. The patient tolerated the procedure well without immediate postprocedural complication. FINDINGS: Small, thick walled urinary bladder with Foley in place. Ultrasound and fluoroscopy was utilized for the placement of a 14 French percutaneous drainage catheter with end coiled and locked within the urinary bladder. IMPRESSION: Technically successful ultrasound and fluoroscopic-guided placement of a 14 French suprapubic catheter. PLAN: Recommend fluoroscopic guided exchange and up sizing of this suprapubic drainage catheter in approximately 6-8 weeks or as per Urology. Ruthann Cancer, MD [facility] Radiology Electronically Signed   By: Ruthann Cancer MD   On: 08/06/2020 17:05   Scheduled Meds: . sodium chloride    Intravenous Once  . atorvastatin  40 mg Oral Daily  . Chlorhexidine Gluconate Cloth  6 each Topical Daily  . dolutegravir  50 mg Oral Daily  . emtricitabine-tenofovir AF  1 tablet Oral QHS  . insulin aspart  0-5 Units Subcutaneous QHS  . [START ON 08/08/2020] insulin aspart  0-9 Units Subcutaneous TID WC  . insulin glargine  15 Units Subcutaneous Daily  . metoprolol tartrate  100 mg Oral BID  . sertraline  100 mg Oral BID  . sodium chloride flush  5 mL Intracatheter Q8H   Continuous Infusions: . cefTRIAXone (ROCEPHIN)  IV 2 g (08/07/20 1218)    LOS: 5 days   [facility],  DO [facility] PAGER is on AMION  If 7PM-7AM, please contact night-coverage [email or web address]

## 2020-08-08 DIAGNOSIS — N493 Fournier gangrene: Secondary | ICD-10-CM | POA: Diagnosis not present

## 2020-08-08 DIAGNOSIS — D649 Anemia, unspecified: Secondary | ICD-10-CM | POA: Diagnosis not present

## 2020-08-08 DIAGNOSIS — R000 Tachycardia, unspecified: Secondary | ICD-10-CM | POA: Diagnosis not present

## 2020-08-08 DIAGNOSIS — N4821 Abscess of corpus cavernosum and penis: Secondary | ICD-10-CM | POA: Diagnosis not present

## 2020-08-08 LAB — CBC WITH DIFFERENTIAL/PLATELET
Abs Immature Granulocytes: 0.15 10*3/uL — ABNORMAL HIGH (ref 0.00–0.07)
Basophils Absolute: 0.1 10*3/uL (ref 0.0–0.1)
Basophils Relative: 0 %
Eosinophils Absolute: 0.2 10*3/uL (ref 0.0–0.5)
Eosinophils Relative: 2 %
HCT: 23.1 % — ABNORMAL LOW (ref 39.0–52.0)
Hemoglobin: 7.1 g/dL — ABNORMAL LOW (ref 13.0–17.0)
Immature Granulocytes: 1 %
Lymphocytes Relative: 13 %
Lymphs Abs: 1.8 10*3/uL (ref 0.7–4.0)
MCH: 28 pg (ref 26.0–34.0)
MCHC: 30.7 g/dL (ref 30.0–36.0)
MCV: 90.9 fL (ref 80.0–100.0)
Monocytes Absolute: 0.7 10*3/uL (ref 0.1–1.0)
Monocytes Relative: 5 %
Neutro Abs: 10.9 10*3/uL — ABNORMAL HIGH (ref 1.7–7.7)
Neutrophils Relative %: 79 %
Platelets: 369 10*3/uL (ref 150–400)
RBC: 2.54 MIL/uL — ABNORMAL LOW (ref 4.22–5.81)
RDW: 18.1 % — ABNORMAL HIGH (ref 11.5–15.5)
WBC: 13.8 10*3/uL — ABNORMAL HIGH (ref 4.0–10.5)
nRBC: 0 % (ref 0.0–0.2)

## 2020-08-08 LAB — COMPREHENSIVE METABOLIC PANEL
ALT: 15 U/L (ref 0–44)
AST: 15 U/L (ref 15–41)
Albumin: 1.4 g/dL — ABNORMAL LOW (ref 3.5–5.0)
Alkaline Phosphatase: 66 U/L (ref 38–126)
Anion gap: 6 (ref 5–15)
BUN: 32 mg/dL — ABNORMAL HIGH (ref 6–20)
CO2: 23 mmol/L (ref 22–32)
Calcium: 7.3 mg/dL — ABNORMAL LOW (ref 8.9–10.3)
Chloride: 110 mmol/L (ref 98–111)
Creatinine, Ser: 1.85 mg/dL — ABNORMAL HIGH (ref 0.61–1.24)
GFR, Estimated: 45 mL/min — ABNORMAL LOW (ref >60)
Glucose, Bld: 148 mg/dL — ABNORMAL HIGH (ref 70–99)
Potassium: 5 mmol/L (ref 3.5–5.1)
Sodium: 139 mmol/L (ref 135–145)
Total Bilirubin: 0.4 mg/dL (ref 0.3–1.2)
Total Protein: 5.5 g/dL — ABNORMAL LOW (ref 6.5–8.1)

## 2020-08-08 LAB — PREPARE RBC (CROSSMATCH)

## 2020-08-08 LAB — GLUCOSE, CAPILLARY
Glucose-Capillary: 138 mg/dL — ABNORMAL HIGH (ref 70–99)
Glucose-Capillary: 206 mg/dL — ABNORMAL HIGH (ref 70–99)
Glucose-Capillary: 232 mg/dL — ABNORMAL HIGH (ref 70–99)

## 2020-08-08 LAB — MAGNESIUM: Magnesium: 2.4 mg/dL (ref 1.7–2.4)

## 2020-08-08 LAB — PHOSPHORUS: Phosphorus: 3.2 mg/dL (ref 2.5–4.6)

## 2020-08-08 MED ORDER — JUVEN PO PACK
1.0000 | PACK | Freq: Two times a day (BID) | ORAL | Status: DC
  Administered 2020-08-08 – 2020-08-12 (×4): 1 via ORAL
  Filled 2020-08-08 (×10): qty 1

## 2020-08-08 MED ORDER — SODIUM CHLORIDE 0.9% IV SOLUTION: Freq: Once | INTRAVENOUS | Status: AC

## 2020-08-08 MED ORDER — CALCIUM GLUCONATE-NACL 1-0.675 GM/50ML-% IV SOLN
1.0000 g | Freq: Once | INTRAVENOUS | Status: AC
  Administered 2020-08-08: 17:00:00 1000 mg via INTRAVENOUS
  Filled 2020-08-08: qty 50

## 2020-08-08 MED ORDER — ADULT MULTIVITAMIN W/MINERALS CH
1.0000 | ORAL_TABLET | Freq: Every day | ORAL | Status: DC
  Administered 2020-08-08 – 2020-08-15 (×8): 1 via ORAL
  Filled 2020-08-08 (×8): qty 1

## 2020-08-08 MED ORDER — GLUCERNA SHAKE PO LIQD
237.0000 mL | Freq: Three times a day (TID) | ORAL | Status: DC
  Administered 2020-08-08 – 2020-08-15 (×22): 237 mL via ORAL
  Filled 2020-08-08 (×9): qty 237

## 2020-08-08 NOTE — Progress Notes (Signed)
PROGRESS NOTE    Francisco Hutchinson  [id number] DOB: 1971/07/26 DOA: 08/01/2020 PCP: [name], MD   Brief Narrative: The patient is a 49 year old nursing home resident with a past medical history significant for but not limited to hypertension, HIV, CVA, diabetes mellitus type 2, anxiety and depression, as well as other comorbidities who presented to the emergency room with septic shock due to Fournier's gangrene neurology is consulted and patient was taken urgently to the OR for incision and drainage.  He was then taken back to the OR on 08/05/2020 for a washout and debridement of the penile and perineal abscess at the patient has had necrotizing fasciitis of the penis and perineum.  He was transferred to [facility] service 08/05/2020.  Wound culture wound 40 is in blood cultures have been negative.  MRSA PCR was negative and Covid was negative as well as flu.  He was started on IV Zosyn and changed to IV Ceftriaxone.  He underwent a suprapubic catheter placement on 08/06/2020 by interventional radiology  On 08/08/2020 his blood count had dropped to 7.1 so he be typed and screened and transfused 1 unit of PRBCs.  This is slowly trending down.  Assessment & Plan:   Active Problems:   Fourniers gangrene   Penile abscess   Pressure injury of skin   Hypotension   Tachycardia   Fournier's Gangrene and necrotizing fasciitis of the penis and perineum  -Presented with septic shock and is admitted to [facility] and and transferred to [facility] service 08/05/2020 -He is status post debridement twice by Urology.  Culture growing Proteus so will continue IV Zosyn but chjanged to IV Ceftriaxone.   -Patient scheduled to have suprapubic catheter placed by interventional radiology and this was done 08/06/20 -Patient afebrile, WBC was trending down from Admission (27.5) and has now stabilized and has now gone from 14.4 -> 14.5 and today on 07/31/2020 it is 13.8 -Urology recommending changing the dressings  wet-to-dry daily as his granulation tissue looks fairly well.  Urology recommending transferred to [facility] later this week to the care of Dr. Francesca Jewett for reconstruction  HIV  -C/w Dolutegravir 50 mg po daily and Emticitabine-Tenofovir 200-25 mg po daily  Hx of CVA -Was on ASA pta and has ben held for now and will resume in the morning pending his hemoglobin -C/w Atorvastatin 40 mg po Daily   Uncontrolled Diabetes Mellitus Type 2 -C/w Lantus 15 units sq daily; patient takes dulaglutide injection 1.5 mg in the skin once a week, Jardiance 10 mg p.o. daily, and Lantus 55 units in the skin subcu at bedtime -C/w Sensitive Novolog SSI AC -CBG's ranging from 138-226 -HbA1c is 8.8 -Continue to Monitor Blood Sugars carefully and adjust insulin as Necessary  Anxiety and Depression -C/w Sertraline 100 mg po BID  Hyponatremia -Improved. Na+ is now 139 -Continue to Monitor and Trend -Currently getting IV fluid hydration with normal saline at 75 MLS per hour -Repeat CMP in the AM   HTN -C/w Metoprolol 100 mg po BID -Currently his hydrochlorothiazide 25 mg p.o. daily currently being held given that he is getting IV fluid hydration -His amlodipine 5 mg p.o. daily also held given his lower blood pressures as his last blood pressure was 99/78-continue monitor blood pressures per protocol  HLD -C/w Atorvastatin 40 mg p.o. daily; patient no longer taking fenofibrate   AKI on CKD Stage 3a -Patient's BUN/Cr went from 28/1.37 -> 28/1.56 -> 29/1.84 -> 36/2.42 -> 35/2.20 and today it is improving and went to  32/1.85 -Continue NS at 75 mL/hr -Avoid Nephrotoxic Medications, Contrast Dyes, Hypotension and Renally adjust Medications -Continue to Monitor and Trend Renal Fxn and Repeat CMP in the AM   Normocytic Anemia/Anemia of Chronic Disease -Patient's Hgb/Hct went from 8.7/28.1 -> 7.0/20.8 -> 6.7/20.1 -> 8.1/24.3 -> 7.7/24.6 and today his hemoglobin dropped to 7.1/23.1 so we will type and screen and  transfuse 1 unit PRBCs -Patient to get his second unit of blood this hospitalization -Continue to Monitor for S/Sx of Bleeding; Currently no overt bleeding noted -Repeat CBC in the AM  Pressure Injuries, poA Pressure Injury 03/24/17 Stage II -  Partial thickness loss of dermis presenting as a shallow open ulcer with a red, pink wound bed without slough. (Active)  03/24/17 1131  Location: Elbow  Location Orientation: Right  Staging: Stage II -  Partial thickness loss of dermis presenting as a shallow open ulcer with a red, pink wound bed without slough.  Wound Description (Comments):   Present on Admission: Yes     Pressure Injury 08/05/20 Hip Anterior;Left Stage 4 - Full thickness tissue loss with exposed bone, tendon or muscle. (Active)  08/05/20 2000  Location: Hip  Location Orientation: Anterior;Left  Staging: Stage 4 - Full thickness tissue loss with exposed bone, tendon or muscle.  Wound Description (Comments):   Present on Admission: Yes     Pressure Injury 08/05/20 Heel Left Deep Tissue Pressure Injury - Purple or maroon localized area of discolored intact skin or blood-filled blister due to damage of underlying soft tissue from pressure and/or shear. (Active)  08/05/20 2000  Location: Heel  Location Orientation: Left  Staging: Deep Tissue Pressure Injury - Purple or maroon localized area of discolored intact skin or blood-filled blister due to damage of underlying soft tissue from pressure and/or shear.  Wound Description (Comments):   Present on Admission: Yes     Pressure Injury 08/07/20 Sacrum Mid;Lower Stage 2 -  Partial thickness loss of dermis presenting as a shallow open injury with a red, pink wound bed without slough. (Active)  08/07/20 0800  Location: Sacrum  Location Orientation: Mid;Lower  Staging: Stage 2 -  Partial thickness loss of dermis presenting as a shallow open injury with a red, pink wound bed without slough.  Wound Description (Comments):   Present  on Admission:    Obesity -Complicates overall prognosis and care -Estimated body mass index is 31.81 kg/m as calculated from the following:   Height as of this encounter: 5\' 8"  (1.727 m).   Weight as of this encounter: 94.9 kg. -Weight Loss and Dietary Counseling given  DVT prophylaxis: SCDs; Has a Low Hgb but will continue to Monitor and likely start Heparin 5,000 units Code Status: FULL CODE  Family Communication: No family present at bedside  Disposition Plan: Likely Transfer [facility] later this week per Urology's recommendations  Status is: Inpatient  Remains inpatient appropriate because:Unsafe d/c plan, IV treatments appropriate due to intensity of illness or inability to take PO and Inpatient level of care appropriate due to severity of illness   Dispo: The patient is from: Home              Anticipated d/c is to: SNF              Anticipated d/c date is: > 3 days              Patient currently is not medically stable to d/c.  Consultants:   PCCM Transfer  Urology  Interventional Radiology  Procedures:  Procedure(s): 1.  Washout and debridement of penile and perineal abscess  Antimicrobials:  Anti-infectives (From admission, onward)   Start     Dose/Rate Route Frequency Ordered Stop   08/06/20 1530  cefTRIAXone (ROCEPHIN) 2 g in sodium chloride 0.9 % 100 mL IVPB        2 g 200 mL/hr over 30 Minutes Intravenous Every 24 hours 08/06/20 1432     08/04/20 2200  emtricitabine-tenofovir AF (DESCOVY) 200-25 MG per tablet 1 tablet        1 tablet Oral Daily at bedtime 08/04/20 0926     08/04/20 1015  dolutegravir (TIVICAY) tablet 50 mg        50 mg Oral Daily 08/04/20 0926     08/03/20 1800  piperacillin-tazobactam (ZOSYN) IVPB 3.375 g  Status:  Discontinued        3.375 g 12.5 mL/hr over 240 Minutes Intravenous Every 8 hours 08/03/20 1719 08/06/20 1432   08/02/20 1000  vancomycin (VANCOCIN) IVPB 1000 mg/200 mL premix  Status:  Discontinued        1,000 mg 200 mL/hr  over 60 Minutes Intravenous Every 12 hours 08/02/20 0354 08/04/20 0924   08/02/20 0600  ceFEPIme (MAXIPIME) 2 g in sodium chloride 0.9 % 100 mL IVPB  Status:  Discontinued        2 g 200 mL/hr over 30 Minutes Intravenous Every 8 hours 08/01/20 2148 08/02/20 0345   08/02/20 0600  piperacillin-tazobactam (ZOSYN) IVPB 3.375 g  Status:  Discontinued        3.375 g 12.5 mL/hr over 240 Minutes Intravenous Every 8 hours 08/02/20 0328 08/03/20 1719   08/01/20 2230  vancomycin (VANCOCIN) IVPB 1000 mg/200 mL premix        1,000 mg 200 mL/hr over 60 Minutes Intravenous  Once 08/01/20 2225 08/02/20 0028   08/01/20 2230  clindamycin (CLEOCIN) IVPB 600 mg        600 mg 100 mL/hr over 30 Minutes Intravenous  Once 08/01/20 2225 08/01/20 2320   08/01/20 2045  ceFEPIme (MAXIPIME) 2 g in sodium chloride 0.9 % 100 mL IVPB        2 g 200 mL/hr over 30 Minutes Intravenous  Once 08/01/20 2041 08/01/20 2249       Subjective: Seen and examined at bedside and he is not in any pain but states that he did not have very good night last night and did not sleep very much.  No nausea or vomiting.  Denies any chest pain or shortness of breath.  Feels okay.  No other concerns or complaints at this time.  Objective: Vitals:   08/08/20 0641 08/08/20 0742 08/08/20 1007 08/08/20 1142  BP:  125/87  99/78  Pulse:  97 92 90  Resp:  18  18  Temp:  97.8 F (36.6 C)  98.6 F (37 C)  TempSrc:  Oral  Oral  SpO2:  99%  98%  Weight: 94.9 kg     Height:        Intake/Output Summary (Last 24 hours) at 08/08/2020 1407 Last data filed at 08/08/2020 0640 Gross per 24 hour  Intake 248 ml  Output 1550 ml  Net -1302 ml   Filed Weights   08/06/20 0514 08/07/20 0500 08/08/20 0641  Weight: 93.3 kg 92.4 kg 94.9 kg   Examination: Physical Exam:  Constitutional: WN/WD obese chronically ill-appearing Caucasian male currently in no acute distress and appears more comfortable today than he did yesterday Eyes: Lids and  conjunctivae normal,  sclerae anicteric  ENMT: External Ears, Nose appear normal. Grossly normal hearing.  Neck: Appears normal, supple, no cervical masses, normal ROM, no appreciable thyromegaly; no JVD Respiratory: Diminished to auscultation bilaterally with coarse breath sounds, no wheezing, rales, rhonchi or crackles. Normal respiratory effort and patient is not tachypenic. No accessory muscle use.  Unlabored breathing Cardiovascular: RRR, no murmurs / rubs / gallops. S1 and S2 auscultated.  No appreciable extremity edema Abdomen: Soft, non-tender, distended secondary by habitus. Bowel sounds positive.  GU: Deferred. Musculoskeletal: No clubbing / cyanosis of digits/nails.  He has a right BKA Skin: Multiple pressure ulcers noted and has one on his left hip. No induration; Warm and dry.  Neurologic: CN 2-12 grossly intact with no focal deficits. Romberg sign and cerebellar reflexes not assessed.  Psychiatric: Normal judgment and insight. Alert and oriented x 3. Normal mood and appropriate affect.   Data Reviewed: I have personally reviewed following labs and imaging studies  CBC: Recent Labs  Lab 08/01/20 1528 08/03/20 0056 08/04/20 0052 08/06/20 0237 08/07/20 0144 08/08/20 0223  WBC 27.5* 21.0* 21.5* 14.4* 14.5* 13.8*  NEUTROABS 25.6*  --   --   --   --  10.9*  HGB 8.7* 7.0* 6.7* 8.1* 7.7* 7.1*  HCT 28.1* 20.8* 20.1* 24.3* 24.6* 23.1*  MCV 88.1 82.5 84.5 85.9 89.1 90.9  PLT 449* 347 342 410* 375 0000000   Basic Metabolic Panel: Recent Labs  Lab 08/02/20 0609 08/03/20 0056 08/04/20 0052 08/06/20 0237 08/07/20 0144 08/08/20 0223  NA 132* 131* 131* 135 138 139  K 3.1* 3.2* 4.2 4.7 4.6 5.0  CL 101 101 101 104 108 110  CO2 22 23 22 22 23 23   GLUCOSE 95 160* 206* 232* 191* 148*  BUN 28* 28* 29* 36* 35* 32*  CREATININE 1.37* 1.56* 1.84* 2.42* 2.20* 1.85*  CALCIUM 7.2* 7.2* 7.3* 7.4* 7.3* 7.3*  MG 1.8 1.9 2.4  --   --  2.4  PHOS 2.4* 3.6 3.6  --   --  3.2   GFR: Estimated  Creatinine Clearance: 55.2 mL/min (A) (by C-G formula based on SCr of 1.85 mg/dL (H)). Liver Function Tests: Recent Labs  Lab 08/02/20 0609 08/03/20 0056 08/06/20 0237 08/07/20 0144 08/08/20 0223  AST 17 16 13* 14* 15  ALT 11 12 13 11 15   ALKPHOS 73 74 75 67 66  BILITOT 0.7 0.9 0.2* 0.4 0.4  PROT 5.5* 5.2* 5.5* 5.3* 5.5*  ALBUMIN 1.7* 1.5* 1.4* 1.3* 1.4*   No results for input(s): LIPASE, AMYLASE in the last 168 hours. No results for input(s): AMMONIA in the last 168 hours. Coagulation Profile: Recent Labs  Lab 08/02/20 1047  INR 1.5*   Cardiac Enzymes: No results for input(s): CKTOTAL, CKMB, CKMBINDEX, TROPONINI in the last 168 hours. BNP (last 3 results) No results for input(s): PROBNP in the last 8760 hours. HbA1C: No results for input(s): HGBA1C in the last 72 hours. CBG: Recent Labs  Lab 08/07/20 1131 08/07/20 1703 08/07/20 2107 08/08/20 0736 08/08/20 1141  GLUCAP 170* 226* 195* 138* 206*   Lipid Profile: No results for input(s): CHOL, HDL, LDLCALC, TRIG, CHOLHDL, LDLDIRECT in the last 72 hours. Thyroid Function Tests: No results for input(s): TSH, T4TOTAL, FREET4, T3FREE, THYROIDAB in the last 72 hours. Anemia Panel: No results for input(s): VITAMINB12, FOLATE, FERRITIN, TIBC, IRON, RETICCTPCT in the last 72 hours. Sepsis Labs: Recent Labs  Lab 08/01/20 1530 08/01/20 2033 08/02/20 0609 08/02/20 0612 08/02/20 [place]  --   --  0.40  --   --   LATICACIDVEN 2.3* 1.2  --  0.8 0.9    Recent Results (from the past 240 hour(s))  Resp Panel by RT-PCR (Flu A&B, Covid) Nasopharyngeal Swab     Status: None   Collection Time: 08/01/20 10:15 PM   Specimen: Nasopharyngeal Swab; Nasopharyngeal(NP) swabs in vial transport medium  Result Value Ref Range Status   SARS Coronavirus 2 by RT PCR NEGATIVE NEGATIVE Final    Comment: (NOTE) SARS-CoV-2 target nucleic acids are NOT DETECTED.  The SARS-CoV-2 RNA is generally detectable in upper  respiratory specimens during the acute phase of infection. The lowest concentration of SARS-CoV-2 viral copies this assay can detect is 138 copies/mL. A negative result does not preclude SARS-Cov-2 infection and should not be used as the sole basis for treatment or other patient management decisions. A negative result may occur with  improper specimen collection/handling, submission of specimen other than nasopharyngeal swab, presence of viral mutation(s) within the areas targeted by this assay, and inadequate number of viral copies(<138 copies/mL). A negative result must be combined with clinical observations, patient history, and epidemiological information. The expected result is Negative.  Fact Sheet for Patients:  EntrepreneurPulse.com.au  Fact Sheet for Healthcare Providers:  IncredibleEmployment.be  This test is no t yet approved or cleared by the [place] FDA and  has been authorized for detection and/or diagnosis of SARS-CoV-2 by FDA under an Emergency Use Authorization (EUA). This EUA will remain  in effect (meaning this test can be used) for the duration of the COVID-19 declaration under Section 564(b)(1) of the Act, 21 U.S.C.section 360bbb-3(b)(1), unless the authorization is terminated  or revoked sooner.       Influenza A by PCR NEGATIVE NEGATIVE Final   Influenza B by PCR NEGATIVE NEGATIVE Final    Comment: (NOTE) The Xpert Xpress SARS-CoV-2/FLU/RSV plus assay is intended as an aid in the diagnosis of influenza from Nasopharyngeal swab specimens and should not be used as a sole basis for treatment. Nasal washings and aspirates are unacceptable for Xpert Xpress SARS-CoV-2/FLU/RSV testing.  Fact Sheet for Patients: EntrepreneurPulse.com.au  Fact Sheet for Healthcare Providers: IncredibleEmployment.be  This test is not yet approved or cleared by the [place] FDA and has been  authorized for detection and/or diagnosis of SARS-CoV-2 by FDA under an Emergency Use Authorization (EUA). This EUA will remain in effect (meaning this test can be used) for the duration of the COVID-19 declaration under Section 564(b)(1) of the Act, 21 U.S.C. section 360bbb-3(b)(1), unless the authorization is terminated or revoked.  Performed at [facility], [address], [place], [place] [address]   Blood Culture (routine x 2)     Status: None   Collection Time: 08/01/20 10:40 PM   Specimen: BLOOD  Result Value Ref Range Status   Specimen Description BLOOD SITE NOT SPECIFIED  Final   Special Requests   Final    BOTTLES DRAWN AEROBIC AND ANAEROBIC Blood Culture adequate volume   Culture   Final    NO GROWTH 5 DAYS Performed at [facility], [address], [place], [place] [address]    Report Status 08/06/2020 FINAL  Final  Blood Culture (routine x 2)     Status: None   Collection Time: 08/01/20 10:40 PM   Specimen: BLOOD  Result Value Ref Range Status   Specimen Description BLOOD SITE NOT SPECIFIED  Final   Special Requests   Final    BOTTLES DRAWN AEROBIC AND ANAEROBIC Blood  Culture adequate volume   Culture   Final    NO GROWTH 5 DAYS Performed at [facility], [address], [place], [place] [address]    Report Status 08/06/2020 FINAL  Final  Aerobic/Anaerobic Culture (surgical/deep wound)     Status: None   Collection Time: 08/02/20  1:37 AM   Specimen: Abscess  Result Value Ref Range Status   Specimen Description ABSCESS  Final   Special Requests PENILE  Final   Gram Stain   Final    ABUNDANT WBC PRESENT, PREDOMINANTLY PMN ABUNDANT GRAM POSITIVE COCCI RARE GRAM POSITIVE RODS    Culture   Final    FEW PROTEUS MIRABILIS RARE GROUP B STREP(S.AGALACTIAE)ISOLATED TESTING AGAINST S. AGALACTIAE NOT ROUTINELY PERFORMED DUE TO PREDICTABILITY OF AMP/PEN/VAN SUSCEPTIBILITY. FEW PREVOTELLA SPECIES BETA LACTAMASE POSITIVE Performed at [facility], [address], [place], [place] [address]    Report Status 08/07/2020 FINAL  Final   Organism ID, Bacteria PROTEUS MIRABILIS  Final      Susceptibility   Proteus mirabilis - MIC*    AMPICILLIN >=32 RESISTANT Resistant     CEFAZOLIN 8 SENSITIVE Sensitive     CEFEPIME <=0.12 SENSITIVE Sensitive     CEFTAZIDIME <=1 SENSITIVE Sensitive     CEFTRIAXONE <=0.25 SENSITIVE Sensitive     CIPROFLOXACIN <=0.25 SENSITIVE Sensitive     GENTAMICIN <=1 SENSITIVE Sensitive     IMIPENEM 2 SENSITIVE Sensitive     TRIMETH/SULFA <=20 SENSITIVE Sensitive     AMPICILLIN/SULBACTAM >=32 RESISTANT Resistant     PIP/TAZO <=4 SENSITIVE Sensitive     * FEW PROTEUS MIRABILIS  MRSA PCR Screening     Status: None   Collection Time: 08/02/20  9:43 AM   Specimen: Nasal Mucosa; Nasopharyngeal  Result Value Ref Range Status   MRSA by PCR NEGATIVE NEGATIVE Final    Comment:        The GeneXpert MRSA Assay (FDA approved for NASAL specimens only), is one component of a comprehensive MRSA colonization surveillance program. It is not intended to diagnose MRSA infection nor to guide or monitor treatment for MRSA infections. Performed at [facility], [address] [place], [place] [address]   Surgical pcr screen     Status: None   Collection Time: 08/05/20  5:08 AM   Specimen: Nasal Mucosa; Nasal Swab  Result Value Ref Range Status   MRSA, PCR NEGATIVE NEGATIVE Final   Staphylococcus aureus NEGATIVE NEGATIVE Final    Comment: (NOTE) The Xpert SA Assay (FDA approved for NASAL specimens in patients 43 years of age and older), is one component of a comprehensive surveillance program. It is not intended to diagnose infection nor to guide or monitor treatment. Performed at [facility], [address], [place], [place] [address]      RN Pressure Injury Documentation: Pressure Injury 03/24/17 Stage II -  Partial thickness loss of dermis presenting as a shallow open ulcer with a red, pink  wound bed without slough. (Active)  03/24/17 1131  Location: Elbow  Location Orientation: Right  Staging: Stage II -  Partial thickness loss of dermis presenting as a shallow open ulcer with a red, pink wound bed without slough.  Wound Description (Comments):   Present on Admission: Yes     Pressure Injury 08/05/20 Hip Anterior;Left Stage 4 - Full thickness tissue loss with exposed bone, tendon or muscle. (Active)  08/05/20 2000  Location: Hip  Location Orientation: Anterior;Left  Staging: Stage 4 -  Full thickness tissue loss with exposed bone, tendon or muscle.  Wound Description (Comments):   Present on Admission: Yes     Pressure Injury 08/05/20 Heel Left Deep Tissue Pressure Injury - Purple or maroon localized area of discolored intact skin or blood-filled blister due to damage of underlying soft tissue from pressure and/or shear. (Active)  08/05/20 2000  Location: Heel  Location Orientation: Left  Staging: Deep Tissue Pressure Injury - Purple or maroon localized area of discolored intact skin or blood-filled blister due to damage of underlying soft tissue from pressure and/or shear.  Wound Description (Comments):   Present on Admission: Yes     Pressure Injury 08/07/20 Sacrum Mid;Lower Stage 2 -  Partial thickness loss of dermis presenting as a shallow open injury with a red, pink wound bed without slough. (Active)  08/07/20 0800  Location: Sacrum  Location Orientation: Mid;Lower  Staging: Stage 2 -  Partial thickness loss of dermis presenting as a shallow open injury with a red, pink wound bed without slough.  Wound Description (Comments):   Present on Admission:      Estimated body mass index is 31.81 kg/m as calculated from the following:   Height as of this encounter: 5\' 8"  (1.727 m).   Weight as of this encounter: 94.9 kg.  Malnutrition Type:  Nutrition Problem: Increased nutrient needs Etiology: wound healing   Malnutrition Characteristics:  Signs/Symptoms:  estimated needs   Nutrition Interventions:  Interventions: Ensure Enlive (each supplement provides 350kcal and 20 grams of protein),MVI     Radiology Studies: IR US Guidance  Result Date: 08/06/2020 INDICATION: 49 year old male with Fournier's gangrene requiring suprapubic urinary bladder divergence. EXAM: ULTRASOUND AND fluoroscopic GUIDED SUPRAPUBIC CATHETER PLACEMENT COMPARISON:  CT abdomen pelvis from 08/01/2020 MEDICATIONS: The patient was receiving antibiotics as an inpatient which were continued through the procedure. ANESTHESIA/SEDATION: Moderate (conscious) sedation was employed during this procedure. A total of Versed 1 mg and Fentanyl 50 mcg was administered intravenously. Moderate Sedation Time: 16 minutes. The patient's level of consciousness and vital signs were monitored continuously by radiology nursing throughout the procedure under my direct supervision. CONTRAST:  None COMPLICATIONS: None immediate. PROCEDURE: The procedure, risks, benefits, and alternatives were explained to the patient. Questions regarding the procedure were encouraged and answered. The patient understands and consents to the procedure. A timeout was performed prior to the initiation of the procedure. The patient was positioned supine on the fluoro table. Preprocedural imaging was obtained of the lower pelvis. The skin overlying the anterior aspect the lower pelvis was prepped and draped in usual sterile fashion. Under direct ultrasound guidance, a 22 gauge needle was utilized for the purposes of procedural planning after the overlying soft tissues were anesthetized with 1% lidocaine. Next, an 13 gauge trocar needle was advanced into the urinary bladder under direct ultrasound guidance. Ultrasound image was saved for procedural documentation purposes. A short Amplatz wire was coiled within the urinary bladder. Appropriate position was confirmed with fluoroscopy. The track was serially dilated ultimately allowing  placement of a 14 French all-purpose drainage catheter with end coiled and locked within the urinary bladder. Contrast was injected to confirm placement within the urinary bladder. The catheter was connected to a gravity bag. The exit site of the catheter was secured within interrupted suture. A dressing was placed. The patient tolerated the procedure well without immediate postprocedural complication. FINDINGS: Small, thick walled urinary bladder with Foley in place. Ultrasound and fluoroscopy was utilized for the placement of a 14 French percutaneous drainage catheter  with end coiled and locked within the urinary bladder. IMPRESSION: Technically successful ultrasound and fluoroscopic-guided placement of a 14 French suprapubic catheter. PLAN: Recommend fluoroscopic guided exchange and up sizing of this suprapubic drainage catheter in approximately 6-8 weeks or as per Urology. Ruthann Cancer, MD Vascular and Interventional [facility] Radiology Electronically Signed   By: Ruthann Cancer MD   On: 08/06/2020 17:05   Scheduled Meds: . sodium chloride   Intravenous Once  . sodium chloride   Intravenous Once  . atorvastatin  40 mg Oral Daily  . Chlorhexidine Gluconate Cloth  6 each Topical Daily  . dolutegravir  50 mg Oral Daily  . emtricitabine-tenofovir AF  1 tablet Oral QHS  . feeding supplement (GLUCERNA SHAKE)  237 mL Oral TID BM  . insulin aspart  0-5 Units Subcutaneous QHS  . insulin aspart  0-9 Units Subcutaneous TID WC  . insulin glargine  15 Units Subcutaneous Daily  . metoprolol tartrate  100 mg Oral BID  . multivitamin with minerals  1 tablet Oral Daily  . nutrition supplement (JUVEN)  1 packet Oral BID BM  . sertraline  100 mg Oral BID  . sodium chloride flush  5 mL Intracatheter Q8H   Continuous Infusions: . sodium chloride 75 mL/hr at 08/08/20 1011  . cefTRIAXone (ROCEPHIN)  IV 2 g (08/07/20 1520)    LOS: 6 days   Kerney Elbe, DO [facility] PAGER  is on [place]  If 7PM-7AM, please contact night-coverage [email or web address]

## 2020-08-08 NOTE — Evaluation (Signed)
Physical Therapy Evaluation and Discharge Patient Details Name: Francisco Hutchinson MRN: [id number] DOB: 08/27/1970 Today's Date: 08/08/2020   History of Present Illness  Pt is a 49 y.o. M nursing home resident for PMH significant for R BKA, hypertension, HIV, CVA, DM2, stroke who presents with septic shock due to Fournier's gangrene, now s/p incision and drainage.  Clinical Impression  Prior to admission, pt is a resident at [facility], requires assist for ADL's, and uses a hoyer lift for out of bed transfers to a wheelchair. Pt states he is able to self propel a wheelchair. On PT evaluation, pt denies pain at rest. Pt received with bowel incontinence, so perform bed mobility to provide peri care and linen change. Pt requiring min-mod assist for rolling, HR 107-144 bpm. Education provided regarding pressure relief schedule and techniques. Pt with no further acute PT needs. See below for recommendations. Thank you for this consult.    Follow Up Recommendations SNF    Equipment Recommendations  None recommended by PT    Recommendations for Other Services       Precautions / Restrictions Precautions Precautions: Fall;Other (comment) Precaution Comments: R BKA, wounds Restrictions Weight Bearing Restrictions: No      Mobility  Bed Mobility Overal bed mobility: Needs Assistance Bed Mobility: Rolling Rolling: Min assist;Mod assist         General bed mobility comments: MinA to roll towards right, modA to roll towards left. Cues for initiation and use of bed rail    Transfers                 General transfer comment: deferred  Ambulation/Gait                Stairs            Wheelchair Mobility    Modified Rankin (Stroke Patients Only)       Balance                                             Pertinent Vitals/Pain Pain Assessment: Faces Faces Pain Scale: Hurts little more Pain Location: Denies pain at rest,  discomfort with rolling Pain Descriptors / Indicators: Discomfort Pain Intervention(s): Monitored during session;Limited activity within patient's tolerance    Home Living Family/patient expects to be discharged to:: Skilled nursing facility                      Prior Function Level of Independence: Needs assistance   Gait / Transfers Assistance Needed: pt reports that staff uses a hoyer lift for mobility but is able to propel w/c himself (says he doesn't get OOB much)  ADL's / Homemaking Assistance Needed: pt reports thathe has mod A with LB ADLs, total A with toileting (has foley)        Hand Dominance        Extremity/Trunk Assessment   Upper Extremity Assessment Upper Extremity Assessment: Defer to OT evaluation    Lower Extremity Assessment Lower Extremity Assessment: RLE deficits/detail;LLE deficits/detail RLE Deficits / Details: R BKA, able to reach neutral knee extension actively LLE Deficits / Details: Able to perform heel slide, plantarflexion oontracture, necrotic toenails       Communication   Communication: No difficulties  Cognition Arousal/Alertness: Awake/alert Behavior During Therapy: Flat affect Overall Cognitive Status: Within Functional Limits for tasks assessed  General Comments: A&Ox4, appropriate for basic mobility evaluation      General Comments      Exercises General Exercises - Lower Extremity Heel Slides: Left;10 reps;Supine   Assessment/Plan    PT Assessment Patent does not need any further PT services  PT Problem List         PT Treatment Interventions      PT Goals (Current goals can be found in the Care Plan section)  Acute Rehab PT Goals Patient Stated Goal: did not state PT Goal Formulation: All assessment and education complete, DC therapy    Frequency     Barriers to discharge        Co-evaluation PT/OT/SLP Co-Evaluation/Treatment: Yes Reason for  Co-Treatment: Complexity of the patient's impairments (multi-system involvement);For patient/therapist safety;To address functional/ADL transfers PT goals addressed during session: Mobility/safety with mobility         AM-PAC PT "6 Clicks" Mobility  Outcome Measure Help needed turning from your back to your side while in a flat bed without using bedrails?: A Lot Help needed moving from lying on your back to sitting on the side of a flat bed without using bedrails?: Total Help needed moving to and from a bed to a chair (including a wheelchair)?: Total Help needed standing up from a chair using your arms (e.g., wheelchair or bedside chair)?: Total Help needed to walk in hospital room?: Total Help needed climbing 3-5 steps with a railing? : Total 6 Click Score: 7    End of Session   Activity Tolerance: Patient tolerated treatment well Patient left: in bed;with call bell/phone within reach Nurse Communication: Mobility status PT Visit Diagnosis: Other abnormalities of gait and mobility (R26.89)    Time: 6767-2094 PT Time Calculation (min) (ACUTE ONLY): 22 min   Charges:   PT Evaluation $PT Eval Moderate Complexity: 1 Mod          Lillia Pauls, PT, DPT Acute Rehabilitation Services Pager [phone] Office [phone]   Norval Morton 08/08/2020, 1:26 PM

## 2020-08-08 NOTE — Evaluation (Signed)
Occupational Therapy Evaluation Patient Details Name: Francisco Hutchinson MRN: [id number] DOB: [birth year] Today's Date: 08/08/2020    History of Present Illness Pt is a 49 y.o. M nursing home resident for PMH significant for R BKA, hypertension, HIV, CVA, DM2, stroke who presents with septic shock due to Fournier's gangrene, now s/p incision and drainage.   Clinical Impression   Prior to admission, pt is a resident at [facility], requires mod assist for LB ADLs, and uses a hoyer lift for out of bed transfers to a wheelchair. Pt states he is able to self propel a wheelchair.  Pt received with bowel incontinence, and was ableto perform bed mobility to provide pericare hygiene and linen change. Pt requiring min-mod assist for rolling, HR 107-144 bpm. Education provided regarding pressure relief schedule and techniques. All education completed and no further acute OT is indicated at this time. Any further OT intervention to be deferred back to the SNF.    Follow Up Recommendations  No OT follow up;SNF;Supervision/Assistance - 24 hour    Equipment Recommendations  None recommended by OT    Recommendations for Other Services       Precautions / Restrictions Precautions Precautions: Fall;Other (comment) Precaution Comments: R BKA, wounds Restrictions Weight Bearing Restrictions: No      Mobility Bed Mobility Overal bed mobility: Needs Assistance Bed Mobility: Rolling Rolling: Min assist;Mod assist         General bed mobility comments: MinA to roll towards right, modA to roll towards left. Cues for initiation and use of bed rail    Transfers                 General transfer comment: deferred    Balance                                           ADL either performed or assessed with clinical judgement   ADL Overall ADL's : Needs assistance/impaired Eating/Feeding: Independent;Set up;Bed level   Grooming: Wash/dry hands;Wash/dry face;Set  up;Bed level   Upper Body Bathing: Set up;Supervision/ safety Upper Body Bathing Details (indicate cue type and reason): simulated Lower Body Bathing: Total assistance   Upper Body Dressing : Supervision/safety;Set up;Bed level   Lower Body Dressing: Total assistance     Toilet Transfer Details (indicate cue type and reason): deferred. Toileting- Clothing Manipulation and Hygiene: Total assistance Toileting - Clothing Manipulation Details (indicate cue type and reason): Pt had BM and unaware. OT/PT cleaned pt up with pt rolling L and R, linens changed             Vision Baseline Vision/History: Wears glasses Patient Visual Report: No change from baseline       Perception     Praxis      Pertinent Vitals/Pain Pain Assessment: Faces Pain Score: 4  Faces Pain Scale: Hurts little more Pain Location: Denies pain at rest, discomfort with rolling Pain Descriptors / Indicators: Discomfort Pain Intervention(s): Limited activity within patient's tolerance;Monitored during session;Repositioned     Hand Dominance Left   Extremity/Trunk Assessment Upper Extremity Assessment Upper Extremity Assessment: Generalized weakness   Lower Extremity Assessment Lower Extremity Assessment: Defer to PT evaluation RLE Deficits / Details: R BKA, able to reach neutral knee extension actively LLE Deficits / Details: Able to perform heel slide, plantarflexion oontracture, necrotic toenails       Communication Communication Communication: No difficulties  Cognition Arousal/Alertness: Awake/alert Behavior During Therapy: Flat affect Overall Cognitive Status: Within Functional Limits for tasks assessed                                 General Comments: A&Ox4, appropriate for basic mobility evaluation   General Comments       Exercises Exercises: General Lower Extremity General Exercises - Lower Extremity Heel Slides: Left;10 reps;Supine   Shoulder Instructions       Home Living Family/patient expects to be discharged to:: Skilled nursing facility                                        Prior Functioning/Environment Level of Independence: Needs assistance  Gait / Transfers Assistance Needed: pt reports that staff uses a hoyer lift for mobility but is able to propel w/c himself (says he doesn't get OOB much) ADL's / Homemaking Assistance Needed: pt reports that he was Inde after ste up with UB ADLs and grooming, has mod A with LB ADLs, total A with toileting (has foley)            OT Problem List: Decreased strength;Decreased range of motion;Impaired balance (sitting and/or standing);Decreased coordination;Decreased activity tolerance;Pain      OT Treatment/Interventions:      OT Goals(Current goals can be found in the care plan section) Acute Rehab OT Goals Patient Stated Goal: did not state OT Goal Formulation: With patient  OT Frequency:     Barriers to D/C:            Co-evaluation PT/OT/SLP Co-Evaluation/Treatment: Yes Reason for Co-Treatment: Complexity of the patient's impairments (multi-system involvement);For patient/therapist safety;To address functional/ADL transfers PT goals addressed during session: Mobility/safety with mobility OT goals addressed during session: ADL's and self-care      AM-PAC OT "6 Clicks" Daily Activity     Outcome Measure Help from another person eating meals?: None Help from another person taking care of personal grooming?: A Little Help from another person toileting, which includes using toliet, bedpan, or urinal?: Total Help from another person bathing (including washing, rinsing, drying)?: Total Help from another person to put on and taking off regular upper body clothing?: A Little Help from another person to put on and taking off regular lower body clothing?: Total 6 Click Score: 13   End of Session    Activity Tolerance: Patient limited by pain;Patient limited by  fatigue Patient left: in bed;with call bell/phone within reach  OT Visit Diagnosis: Other abnormalities of gait and mobility (R26.89);Muscle weakness (generalized) (M62.81);Pain Pain - Right/Left:  (bilaterally with rolling in bed) Pain - part of body: Hip;Leg                Time: 1037-1100 OT Time Calculation (min): 23 min Charges:  OT General Charges $OT Visit: 1 Visit OT Evaluation $OT Eval Moderate Complexity: 1 Mod    Galen Manila 08/08/2020, 1:35 PM

## 2020-08-08 NOTE — Progress Notes (Addendum)
Nutrition Follow-up  DOCUMENTATION CODES:   Not applicable  INTERVENTION:    Glucerna Shake po TID, each supplement provides 220 kcal and 10 grams of protein  MVI with minerals daily  Juven 1 packet PO BID, each packet provides 80 calories, 8 grams of carbohydrate, 2.5  grams of protein (collagen), 7 grams of L-arginine and 7 grams of L-glutamine; supplement contains CaHMB, Vitamins C, E, B12 and Zinc to promote wound healing  NUTRITION DIAGNOSIS:   Increased nutrient needs related to wound healing as evidenced by estimated needs.  Ongoing  GOAL:   Patient will meet greater than or equal to 90% of their needs   Progressing  MONITOR:   Diet advancement,PO intake,Skin,Labs,Supplement acceptance  REASON FOR ASSESSMENT:   Rounds    ASSESSMENT:   49 yo male admitted with Fournier's gangrene. S/P I&D of penile abscess on admission. PMH includes HTN, HIV, CVA, DM, R BKA, anxiety, depression.   S/P washout and debridement of necrotizing fasciitis of penis and perineum on 12/24 and 12/26.  Per Urology notes, may transfer to another hospital for reconstruction later this week.   Patient was sleeping, so RD did not disturb. Spoke with RN. Patient is eating well, he consumed 80% of breakfast today. He is requesting Glucerna supplements. Will add.  Meal intakes documented at 40-100%.  Labs reviewed. BUN 32, Creat 1.85 CBG: 138-206  Medications reviewed and include novolog, lantus.   Current weight is 94.9 kg up from 89.3 kg on 12/24. Weight likely increased d/t fluids.   Diet Order:   Diet Order            Diet heart healthy/carb modified Room service appropriate? Yes; Fluid consistency: Thin  Diet effective now                 EDUCATION NEEDS:   Education needs have been addressed (encouraged adequate protein intake to support healing)  Skin:  Skin Assessment: Skin Integrity Issues: Skin Integrity Issues:: Stage II,Stage IV,DTI DTI: L heel Stage II:  sacrum Stage IV: L hip Incisions: Fournier's gangrene to penis, S/P I&D   Last BM:  12/28 type 5  Height:   Ht Readings from Last 1 Encounters:  08/02/20 5\' 8"  (1.727 m)    Weight:   Wt Readings from Last 1 Encounters:  08/08/20 94.9 kg    Ideal Body Weight:  65.5 kg (adjusted for BKA)  BMI:  34 (adjusted for BKA)  Estimated Nutritional Needs:   Kcal:  2300-2500  Protein:  125-150 gm  Fluid:  >/= 2.3 L    08/10/20, RD, LDN, CNSC Please refer to Amion for contact information.

## 2020-08-09 ENCOUNTER — Encounter (HOSPITAL_COMMUNITY): Payer: Self-pay | Admitting: Pulmonary Disease

## 2020-08-09 DIAGNOSIS — N4821 Abscess of corpus cavernosum and penis: Secondary | ICD-10-CM | POA: Diagnosis not present

## 2020-08-09 DIAGNOSIS — I10 Essential (primary) hypertension: Secondary | ICD-10-CM

## 2020-08-09 DIAGNOSIS — I480 Paroxysmal atrial fibrillation: Secondary | ICD-10-CM

## 2020-08-09 DIAGNOSIS — R000 Tachycardia, unspecified: Secondary | ICD-10-CM | POA: Diagnosis not present

## 2020-08-09 DIAGNOSIS — I429 Cardiomyopathy, unspecified: Secondary | ICD-10-CM | POA: Diagnosis not present

## 2020-08-09 DIAGNOSIS — I4891 Unspecified atrial fibrillation: Secondary | ICD-10-CM

## 2020-08-09 DIAGNOSIS — N493 Fournier gangrene: Secondary | ICD-10-CM | POA: Diagnosis not present

## 2020-08-09 LAB — TYPE AND SCREEN
ABO/RH(D): A POS
Antibody Screen: NEGATIVE
Unit division: 0

## 2020-08-09 LAB — COMPREHENSIVE METABOLIC PANEL
ALT: 18 U/L (ref 0–44)
AST: 23 U/L (ref 15–41)
Albumin: 1.5 g/dL — ABNORMAL LOW (ref 3.5–5.0)
Alkaline Phosphatase: 81 U/L (ref 38–126)
Anion gap: 6 (ref 5–15)
BUN: 28 mg/dL — ABNORMAL HIGH (ref 6–20)
CO2: 22 mmol/L (ref 22–32)
Calcium: 7.2 mg/dL — ABNORMAL LOW (ref 8.9–10.3)
Chloride: 112 mmol/L — ABNORMAL HIGH (ref 98–111)
Creatinine, Ser: 1.48 mg/dL — ABNORMAL HIGH (ref 0.61–1.24)
GFR, Estimated: 58 mL/min — ABNORMAL LOW (ref >60)
Glucose, Bld: 198 mg/dL — ABNORMAL HIGH (ref 70–99)
Potassium: 5.5 mmol/L — ABNORMAL HIGH (ref 3.5–5.1)
Sodium: 140 mmol/L (ref 135–145)
Total Bilirubin: 0.2 mg/dL — ABNORMAL LOW (ref 0.3–1.2)
Total Protein: 6 g/dL — ABNORMAL LOW (ref 6.5–8.1)

## 2020-08-09 LAB — CBC WITH DIFFERENTIAL/PLATELET
Abs Immature Granulocytes: 0.15 10*3/uL — ABNORMAL HIGH (ref 0.00–0.07)
Basophils Absolute: 0.1 10*3/uL (ref 0.0–0.1)
Basophils Relative: 1 %
Eosinophils Absolute: 0.2 10*3/uL (ref 0.0–0.5)
Eosinophils Relative: 2 %
HCT: 26.6 % — ABNORMAL LOW (ref 39.0–52.0)
Hemoglobin: 8.4 g/dL — ABNORMAL LOW (ref 13.0–17.0)
Immature Granulocytes: 1 %
Lymphocytes Relative: 14 %
Lymphs Abs: 1.7 10*3/uL (ref 0.7–4.0)
MCH: 28.3 pg (ref 26.0–34.0)
MCHC: 31.6 g/dL (ref 30.0–36.0)
MCV: 89.6 fL (ref 80.0–100.0)
Monocytes Absolute: 0.5 10*3/uL (ref 0.1–1.0)
Monocytes Relative: 4 %
Neutro Abs: 10 10*3/uL — ABNORMAL HIGH (ref 1.7–7.7)
Neutrophils Relative %: 78 %
Platelets: 364 10*3/uL (ref 150–400)
RBC: 2.97 MIL/uL — ABNORMAL LOW (ref 4.22–5.81)
RDW: 18 % — ABNORMAL HIGH (ref 11.5–15.5)
WBC: 12.6 10*3/uL — ABNORMAL HIGH (ref 4.0–10.5)
nRBC: 0 % (ref 0.0–0.2)

## 2020-08-09 LAB — BPAM RBC
Blood Product Expiration Date: 202201052359
ISSUE DATE / TIME: 202112291811
Unit Type and Rh: 6200

## 2020-08-09 LAB — GLUCOSE, CAPILLARY
Glucose-Capillary: 206 mg/dL — ABNORMAL HIGH (ref 70–99)
Glucose-Capillary: 146 mg/dL — ABNORMAL HIGH (ref 70–99)
Glucose-Capillary: 207 mg/dL — ABNORMAL HIGH (ref 70–99)
Glucose-Capillary: 194 mg/dL — ABNORMAL HIGH (ref 70–99)
Glucose-Capillary: 155 mg/dL — ABNORMAL HIGH (ref 70–99)

## 2020-08-09 LAB — MAGNESIUM: Magnesium: 2.5 mg/dL — ABNORMAL HIGH (ref 1.7–2.4)

## 2020-08-09 LAB — PHOSPHORUS: Phosphorus: 3.5 mg/dL (ref 2.5–4.6)

## 2020-08-09 LAB — TROPONIN I (HIGH SENSITIVITY)
Troponin I (High Sensitivity): 13 ng/L (ref <18)
Troponin I (High Sensitivity): 17 ng/L (ref <18)

## 2020-08-09 MED ORDER — METRONIDAZOLE 500 MG PO TABS
500.0000 mg | ORAL_TABLET | Freq: Three times a day (TID) | ORAL | Status: DC
  Administered 2020-08-09 – 2020-08-15 (×20): 500 mg via ORAL
  Filled 2020-08-09 (×20): qty 1

## 2020-08-09 MED ORDER — METOPROLOL TARTRATE 5 MG/5ML IV SOLN
5.0000 mg | Freq: Once | INTRAVENOUS | Status: AC
  Administered 2020-08-09: 11:00:00 5 mg via INTRAVENOUS
  Filled 2020-08-09: qty 5

## 2020-08-09 MED ORDER — SODIUM ZIRCONIUM CYCLOSILICATE 10 G PO PACK
10.0000 g | PACK | Freq: Once | ORAL | Status: AC
  Administered 2020-08-09: 11:00:00 10 g via ORAL
  Filled 2020-08-09: qty 1

## 2020-08-09 MED ORDER — DILTIAZEM HCL 60 MG PO TABS
30.0000 mg | ORAL_TABLET | Freq: Four times a day (QID) | ORAL | Status: DC
  Administered 2020-08-09 – 2020-08-10 (×2): 30 mg via ORAL
  Filled 2020-08-09 (×2): qty 1

## 2020-08-09 NOTE — Progress Notes (Signed)
Labs improved Wound looks good Spoke to nursing re lower perineal opening pack Continue with wound care Will update soon regarding transfer to [facility] early next week

## 2020-08-09 NOTE — Consult Note (Signed)
Cardiology Consultation:   Patient ID: Francisco Hutchinson MRN: [id number]; DOB: [birth year]  Admit date: 08/01/2020 Date of Consult: 08/09/2020  Primary Care Provider: [name], MD [facility] Cardiologist: New   Patient Profile:   Francisco Hutchinson is a 49 y.o. male with a hx of CVA, hypertension, HIV, diabetes mellitus, and admitted with Fournier's gangrene who is being seen today for the evaluation of atrial fibrillation at the request of [place].  History of Present Illness:   Patient resides in a nursing home.  He developed Fournier's gangrene and was admitted on December 23.  He was taken emergently to the operating room by urology and had incision and drainage of penile abscess and suprapubic area as well as cystoscopy and insertion of catheter.  Patient developed atrial fibrillation and cardiology asked to evaluate.  Patient denies dyspnea, chest pain, palpitations or syncope.   Past Medical History:  Diagnosis Date  . Allergy   . Anxiety and depression 05/29/2017  . Atrial fibrillation ([place])   . Diabetes mellitus without complication ([place])    type 2  . HIV (human immunodeficiency virus infection) ([place])   . Hyperlipidemia   . Hypertension   . Stroke [facility])     Past Surgical History:  Procedure Laterality Date  . ABDOMINAL AORTOGRAM W/LOWER EXTREMITY N/A 01/20/2018   Procedure: ABDOMINAL AORTOGRAM W/LOWER EXTREMITY;  Surgeon: Waynetta Sandy, MD;  Location: [place] [facility];  Service: Cardiovascular;  Laterality: N/A;  . abscess removed from back and left armpit    . AMPUTATION Right 01/22/2018   Procedure: RIGHT BELOW KNEE AMPUTATION;  Surgeon: [name], MD;  Location: [place];  Service: Orthopedics;  Laterality: Right;  . CYSTOSCOPY WITH STENT PLACEMENT N/A 08/01/2020   Procedure: CYSTOSCOPY;  Surgeon: Bjorn Loser, MD;  Location: [place];  Service: Urology;  Laterality: N/A;  . IR US GUIDANCE  08/06/2020  . IRRIGATION AND  DEBRIDEMENT ABSCESS N/A 08/05/2020   Procedure: IRRIGATION AND DEBRIDEMENT PENIS AND PERINEUM;  Surgeon: Janith Lima, MD;  Location: [place];  Service: Urology;  Laterality: N/A;  . IRRIGATION AND DEBRIDEMENT BUTTOCKS Left 03/23/2017   Procedure: IRRIGATION AND DEBRIDEMENT LEFT BUTTOCK ABSCESS;  Surgeon: Excell Seltzer, MD;  Location: [facility];  Service: General;  Laterality: Left;  . IRRIGATION AND DEBRIDEMENT BUTTOCKS N/A 03/25/2017   Procedure: IRRIGATION AND DEBRIDEMENT DRESSING CHANGE AND EXPLORATION OF PERINEAL WOUND; CYSTOSCOPY;  Surgeon: Excell Seltzer, MD;  Location: [facility];  Service: General;  Laterality: N/A;  . PARS PLANA VITRECTOMY 27 GAUGE WITH 25 GAUGE PORT Left 08/31/2017  . Surgery Right Arm    . WOUND EXPLORATION Bilateral 08/01/2020   Procedure: EXPLORATION AND DEBRIDEMENT GROINS;  Surgeon: Bjorn Loser, MD;  Location: [facility];  Service: Urology;  Laterality: Bilateral;  . WOUND EXPLORATION N/A 08/03/2020   Procedure: Re-exploration and debridement of penis and groins;  Surgeon: Robley Fries, MD;  Location: [place];  Service: Urology;  Laterality: N/A;     Inpatient Medications: Scheduled Meds: . sodium chloride   Intravenous Once  . atorvastatin  40 mg Oral Daily  . Chlorhexidine Gluconate Cloth  6 each Topical Daily  . dolutegravir  50 mg Oral Daily  . emtricitabine-tenofovir AF  1 tablet Oral QHS  . feeding supplement (GLUCERNA SHAKE)  237 mL Oral TID BM  . insulin aspart  0-5 Units Subcutaneous QHS  . insulin aspart  0-9 Units Subcutaneous TID WC  . insulin glargine  15 Units Subcutaneous Daily  .  metoprolol tartrate  100 mg Oral BID  . metroNIDAZOLE  500 mg Oral Q8H  . multivitamin with minerals  1 tablet Oral Daily  . nutrition supplement (JUVEN)  1 packet Oral BID BM  . sertraline  100 mg Oral BID  . sodium chloride flush  5 mL Intracatheter Q8H   Continuous Infusions: . cefTRIAXone (ROCEPHIN)  IV 2 g (08/09/20 1055)   PRN Meds: docusate  sodium, HYDROmorphone, ondansetron (ZOFRAN) IV, polyethylene glycol  Allergies:   No Known Allergies  Social History:   Social History   Socioeconomic History  . Marital status: Single    Spouse name: Not on file  . Number of children: Not on file  . Years of education: Not on file  . Highest education level: Not on file  Occupational History  . Not on file  Tobacco Use  . Smoking status: Never Smoker  . Smokeless tobacco: Never Used  Vaping Use  . Vaping Use: Never used  Substance and Sexual Activity  . Alcohol use: Yes    Alcohol/week: 2.0 standard drinks    Types: 1 Glasses of wine, 1 Cans of beer per week    Comment: rare  . Drug use: No  . Sexual activity: Not Currently  Other Topics Concern  . Not on file  Social History Narrative  . Not on file   Social Determinants of Health   Financial Resource Strain: Not on file  Food Insecurity: Not on file  Transportation Needs: Not on file  Physical Activity: Not on file  Stress: Not on file  Social Connections: Not on file  Intimate Partner Violence: Not on file    Family History:    Family History  Problem Relation Age of Onset  . Arthritis Mother   . Hypertension Mother   . Alcohol abuse Father   . Cancer Father   . Diabetes Father   . Early death Father   . Hyperlipidemia Father   . Hypertension Father   . Alcohol abuse Sister   . Heart attack Maternal Grandmother   . Heart disease Maternal Grandmother   . Hypertension Maternal Grandmother   . Cancer Maternal Grandfather        lung  . Cancer Paternal Grandmother   . Diabetes Paternal Grandmother      ROS:  Please see the history of present illness.  No hemoptysis, melena or hematochezia All other ROS reviewed and negative.     Physical Exam/Data:   Vitals:   08/09/20 1112 08/09/20 1120 08/09/20 1145 08/09/20 1609  BP: (!) 136/99 (!) 137/93 (!) 122/94 (!) 141/104  Pulse:   100   Resp:   18   Temp:   (!) 97.5 F (36.4 C) 98.1 F (36.7 C)   TempSrc:   Oral Oral  SpO2:    100%  Weight:      Height:        Intake/Output Summary (Last 24 hours) at 08/09/2020 1818 Last data filed at 08/09/2020 1100 Gross per 24 hour  Intake 680 ml  Output 1500 ml  Net -820 ml   Last 3 Weights 08/08/2020 08/07/2020 08/06/2020  Weight (lbs) 209 lb 3.5 oz 203 lb 11.3 oz 205 lb 11 oz  Weight (kg) 94.9 kg 92.4 kg 93.3 kg     Body mass index is 31.81 kg/m.  General:  Well developed, chronically ill appearing in no acute distress HEENT: normal Lymph: no adenopathy Neck: no JVD Endocrine:  No thryomegaly Vascular: No carotid bruits; FA pulses  2+ bilaterally without bruits  Cardiac:  normal S1, S2; irregular and tachycardic; no murmur Lungs:  clear to auscultation bilaterally, no wheezing, rhonchi or rales  Abd: soft, nontender, no hepatomegaly; status post urological surgery Ext: no edema; status post right BKA; excoriations over left lower extremity Musculoskeletal:  No gross focal findings Skin: warm and dry  Neuro:  CNs 2-12 intact, no focal abnormalities noted Psych:  Normal affect   EKG:  The EKG was personally reviewed and demonstrates: Atrial fibrillation with rapid ventricular response and prior inferior infarct cannot be excluded. Telemetry:  Telemetry was personally reviewed and demonstrates: Atrial fibrillation with rate upper normal to mildly elevated.  Relevant CV Studies: Echo  1. Left ventricular ejection fraction, by estimation, is 40 to 45%. The  left ventricle has mildly decreased function. The left ventricle  demonstrates global hypokinesis. Left ventricular diastolic function could  not be evaluated.  2. Right ventricular systolic function is mildly reduced. The right  ventricular size is mildly enlarged. There is mildly elevated pulmonary  artery systolic pressure. The estimated right ventricular systolic  pressure is 123XX123 mmHg.  3. Left atrial size was mildly dilated.  4. The mitral valve is grossly  normal. Trivial mitral valve  regurgitation. No evidence of mitral stenosis.  5. The aortic valve is tricuspid. Aortic valve regurgitation is not  visualized. No aortic stenosis is present.  6. The inferior vena cava is dilated in size with <50% respiratory  variability, suggesting right atrial pressure of 15 mmHg.   Laboratory Data:  High Sensitivity Troponin:   Recent Labs  Lab 08/09/20 1105 08/09/20 1220  TROPONINIHS 13 17     Chemistry Recent Labs  Lab 08/07/20 0144 08/08/20 0223 08/09/20 0142  NA 138 139 140  K 4.6 5.0 5.5*  CL 108 110 112*  CO2 23 23 22   GLUCOSE 191* 148* 198*  BUN 35* 32* 28*  CREATININE 2.20* 1.85* 1.48*  CALCIUM 7.3* 7.3* 7.2*  GFRNONAA 36* 45* 58*  ANIONGAP 7 6 6     Recent Labs  Lab 08/07/20 0144 08/08/20 0223 08/09/20 0142  PROT 5.3* 5.5* 6.0*  ALBUMIN 1.3* 1.4* 1.5*  AST 14* 15 23  ALT 11 15 18   ALKPHOS 67 66 81  BILITOT 0.4 0.4 0.2*   Hematology Recent Labs  Lab 08/07/20 0144 08/08/20 0223 08/09/20 0142  WBC 14.5* 13.8* 12.6*  RBC 2.76* 2.54* 2.97*  HGB 7.7* 7.1* 8.4*  HCT 24.6* 23.1* 26.6*  MCV 89.1 90.9 89.6  MCH 27.9 28.0 28.3  MCHC 31.3 30.7 31.6  RDW 18.0* 18.1* 18.0*  PLT 375 369 364    Radiology/Studies:  IR US Guidance  Result Date: 08/06/2020 INDICATION: 49 year old male with Fournier's gangrene requiring suprapubic urinary bladder divergence. EXAM: ULTRASOUND AND fluoroscopic GUIDED SUPRAPUBIC CATHETER PLACEMENT COMPARISON:  CT abdomen pelvis from 08/01/2020 MEDICATIONS: The patient was receiving antibiotics as an inpatient which were continued through the procedure. ANESTHESIA/SEDATION: Moderate (conscious) sedation was employed during this procedure. A total of Versed 1 mg and Fentanyl 50 mcg was administered intravenously. Moderate Sedation Time: 16 minutes. The patient's level of consciousness and vital signs were monitored continuously by radiology nursing throughout the procedure under my direct  supervision. CONTRAST:  None COMPLICATIONS: None immediate. PROCEDURE: The procedure, risks, benefits, and alternatives were explained to the patient. Questions regarding the procedure were encouraged and answered. The patient understands and consents to the procedure. A timeout was performed prior to the initiation of the procedure. The patient was positioned supine on the  fluoro table. Preprocedural imaging was obtained of the lower pelvis. The skin overlying the anterior aspect the lower pelvis was prepped and draped in usual sterile fashion. Under direct ultrasound guidance, a 22 gauge needle was utilized for the purposes of procedural planning after the overlying soft tissues were anesthetized with 1% lidocaine. Next, an 18 gauge trocar needle was advanced into the urinary bladder under direct ultrasound guidance. Ultrasound image was saved for procedural documentation purposes. A short Amplatz wire was coiled within the urinary bladder. Appropriate position was confirmed with fluoroscopy. The track was serially dilated ultimately allowing placement of a 14 French all-purpose drainage catheter with end coiled and locked within the urinary bladder. Contrast was injected to confirm placement within the urinary bladder. The catheter was connected to a gravity bag. The exit site of the catheter was secured within interrupted suture. A dressing was placed. The patient tolerated the procedure well without immediate postprocedural complication. FINDINGS: Small, thick walled urinary bladder with Foley in place. Ultrasound and fluoroscopy was utilized for the placement of a 14 French percutaneous drainage catheter with end coiled and locked within the urinary bladder. IMPRESSION: Technically successful ultrasound and fluoroscopic-guided placement of a 14 French suprapubic catheter. PLAN: Recommend fluoroscopic guided exchange and up sizing of this suprapubic drainage catheter in approximately 6-8 weeks or as per  Urology. Marliss Coots, MD [facility] Electronically Signed   By: Marliss Coots MD   On: 08/06/2020 17:05     Assessment and Plan:   1. Paroxysmal atrial fibrillation-patient has developed atrial fibrillation with mildly elevated rate.  This occurred in the setting of Fournier's gangrene.  Will check TSH for completeness.  Continue metoprolol 100 mg twice daily for rate control.  I will add Cardizem 30 mg every 6 hours and can change to Cardizem CD 120 mg daily tomorrow if blood pressure and heart rate stable.  Echocardiogram shows ejection fraction 40 to 45%. Chadsvasc 5.  Patient states his Xarelto was discontinued approximately 2 months ago because of injury to his right upper extremity and bleeding.  This appears to have resolved.  Would not add at this point given recent urological surgery.  He is also to be transferred to [facility] for reconstructive surgery.  Would resume heparin when okay with urology and once all procedures complete resume Xarelto at that time. 2. Cardiomyopathy-ejection fraction 40 to 45%.  Continue beta-blocker.  Can consider adding ARB later but would not at this point given potassium 5.5.  Would repeat echocardiogram once all procedures are complete and he recovers from present illness.  Not clear that he would be a good candidate for aggressive ischemia evaluation given comorbidities. 3. Hypertension-blood pressure mildly elevated.  I am adding Cardizem for rate control with atrial fibrillation.  Follow blood pressure and adjust regimen as needed. 4. Hyperlipidemia-continue statin. 5. Fournier's gangrene-management per urology.  Patient to be transferred to [facility] for reconstructive surgery. 6. HIV  For questions or updates, please contact [facility] Please consult [email or web address] for contact info under    Signed, Olga Millers, MD  08/09/2020 6:18 PM

## 2020-08-09 NOTE — Progress Notes (Signed)
PROGRESS NOTE    Francisco Hutchinson  [id number] DOB: September 19, 1970 DOA: 08/01/2020 PCP: [name], MD   Brief Narrative: The patient is a 49 year old nursing home resident with a past medical history significant for but not limited to hypertension, HIV, CVA, diabetes mellitus type 2, anxiety and depression, as well as other comorbidities who presented to the emergency room with septic shock due to Fournier's gangrene neurology is consulted and patient was taken urgently to the OR for incision and drainage.  He was then taken back to the OR on 08/05/2020 for a washout and debridement of the penile and perineal abscess at the patient has had necrotizing fasciitis of the penis and perineum.  He was transferred to [facility] service 08/05/2020.  Wound culture wound 40 is in blood cultures have been negative.  MRSA PCR was negative and Covid was negative as well as flu.  He was started on IV Zosyn and changed to IV Ceftriaxone.  He underwent a suprapubic catheter placement on 08/06/2020 by interventional radiology  On 08/08/2020 his blood count had dropped to 7.1 so he be typed and screened and transfused 1 unit of PRBCs as his blood count has been slowly trending down.  Repeat blood count 08/09/2020 is now 8.4/26.6 and stable.  Renal function is improving but he was little hyperkalemic.  He is given bolus of Lokelma.  He was found to be in new onset atrial fibrillation with RVR and was given metoprolol push.  Case was discussed with cardiology who will evaluate the patient tomorrow and recommending if necessary place the patient on amiodarone drip  Assessment & Plan:   Active Problems:   Fourniers gangrene   Penile abscess   Pressure injury of skin   Hypotension   Tachycardia   Fournier's Gangrene and necrotizing fasciitis of the penis and perineum  -Presented with septic shock and is admitted to [facility] and and transferred to [facility] service 08/05/2020 -He is status post debridement twice by Urology.   Culture growing Proteus so will continue IV Zosyn but chjanged to IV Ceftriaxone.   -Patient scheduled to have suprapubic catheter placed by interventional radiology and this was done 08/06/20 -Patient afebrile, WBC was trending down from Admission (27.5) and has now stabilized and has now gone from 14.4 -> 14.5 -> 13.8 -> 12.6 -Urology recommending changing the dressings wet-to-dry daily as his granulation tissue looks fairly well.  Urology recommending transferred to [facility] later this week to the care of Dr. Francesca Jewett for reconstruction but have not heard anything and now they are recommending early next week at some time and urology to advise 1 to transfer  HIV  -C/w Dolutegravir 50 mg po daily and Emticitabine-Tenofovir 200-25 mg po daily  Hx of CVA -Was on ASA pta and has ben held for now and will resume in the morning pending his hemoglobin -C/w Atorvastatin 40 mg po Daily   Uncontrolled Diabetes Mellitus Type 2 -C/w Lantus 15 units sq daily; patient takes dulaglutide injection 1.5 mg in the skin once a week, Jardiance 10 mg p.o. daily, and Lantus 55 units in the skin subcu at bedtime -C/w Sensitive Novolog SSI AC -CBG's ranging from 146-232 -HbA1c is 8.8 -Continue to Monitor Blood Sugars carefully and adjust insulin as Necessary  Anxiety and Depression -C/w Sertraline 100 mg po BID  Hyponatremia -Improved. Na+ is now 140 -Continue to Monitor and Trend -IV fluid hydration is now stopped -Repeat CMP in the AM   HTN -C/w Metoprolol 100 mg po BID -Currently  his hydrochlorothiazide 25 mg p.o. daily currently being held given that he is getting IV fluid hydration -His amlodipine 5 mg p.o. daily also held given his lower blood pressures as his last blood pressure was 141/104 -continue monitor blood pressures per protocol  HLD -C/w Atorvastatin 40 mg p.o. daily; patient no longer taking fenofibrate   AKI on CKD Stage 3a -Patient's BUN/Cr went from 28/1.37 -> 28/1.56 -> 29/1.84  -> 36/2.42 -> 35/2.20 -> 32/1.85 today it is improving and went to 28/1.48 -IV fluid hydration is now stopped -Avoid Nephrotoxic Medications, Contrast Dyes, Hypotension and Renally adjust Medications -Continue to Monitor and Trend Renal Fxn and Repeat CMP in the AM   Hyperkalemia -Patient had a mildly elevated potassium at 5.5 -Was trending up for last 3 days -we will give a dose of Lokelma 10 g -Continue monitor trend and repeat CMP in a.m.  Normocytic Anemia/Anemia of Chronic Disease -Patient's Hgb/Hct went from 8.7/28.1 -> 7.0/20.8 -> 6.7/20.1 -> 8.1/24.3 -> 7.7/24.6 and yesterday his hemoglobin dropped to 7.1/23.1 so we will type and screen and transfuse 1 unit PRBCs; now hemoglobin/marker is now 8.4/26.6 -Patient to get his second unit of blood this hospitalization -Continue to Monitor for S/Sx of Bleeding; Currently no overt bleeding noted -Repeat CBC in the AM  New Onset A. fib with RVR -Patient had heart rates in the 130s to 140s today -EKG done in confirmed A. Fib -Echo done recently showed an EF of 40 to 45% -Avoiding Cardizem given his low EF -Continue with metoprolol tartrate 100 g p.o. twice daily and given 5 mg of IV metoprolol push -Spoke to cardiology Dr. Cristal Deerhristopher who recommends if necessary starting an amiodarone drip but continue metoprolol for now.  Cardiology will be by in the morning to see the patient and they did not recommend starting anticoagulation at this time given that the patient will likely need surgical procedure early next week -Patient's CHA2DS2-VASc score is at least 3 for CHF, hypertension, diabetes -Defer anticoagulation to cardiology and further work-up for cardiology when they are see the patient -Heart rate is improved and is in the low 100s -Troponin was obtained and first was 13 and next one was 17 -Continue monitor on telemetry clinically  Pressure Injuries, poA Pressure Injury 03/24/17 Stage II -  Partial thickness loss of dermis  presenting as a shallow open ulcer with a red, pink wound bed without slough. (Active)  03/24/17 1131  Location: Elbow  Location Orientation: Right  Staging: Stage II -  Partial thickness loss of dermis presenting as a shallow open ulcer with a red, pink wound bed without slough.  Wound Description (Comments):   Present on Admission: Yes     Pressure Injury 08/05/20 Hip Anterior;Left Stage 4 - Full thickness tissue loss with exposed bone, tendon or muscle. (Active)  08/05/20 2000  Location: Hip  Location Orientation: Anterior;Left  Staging: Stage 4 - Full thickness tissue loss with exposed bone, tendon or muscle.  Wound Description (Comments):   Present on Admission: Yes     Pressure Injury 08/05/20 Heel Left Deep Tissue Pressure Injury - Purple or maroon localized area of discolored intact skin or blood-filled blister due to damage of underlying soft tissue from pressure and/or shear. (Active)  08/05/20 2000  Location: Heel  Location Orientation: Left  Staging: Deep Tissue Pressure Injury - Purple or maroon localized area of discolored intact skin or blood-filled blister due to damage of underlying soft tissue from pressure and/or shear.  Wound Description (Comments):  Present on Admission: Yes     Pressure Injury 08/07/20 Sacrum Mid;Lower Stage 2 -  Partial thickness loss of dermis presenting as a shallow open injury with a red, pink wound bed without slough. (Active)  08/07/20 0800  Location: Sacrum  Location Orientation: Mid;Lower  Staging: Stage 2 -  Partial thickness loss of dermis presenting as a shallow open injury with a red, pink wound bed without slough.  Wound Description (Comments):   Present on Admission:    Obesity -Complicates overall prognosis and care -Estimated body mass index is 31.81 kg/m as calculated from the following:   Height as of this encounter: 5\' 8"  (1.727 m).   Weight as of this encounter: 94.9 kg. -Weight Loss and Dietary Counseling given  DVT  prophylaxis: SCDs; Has a Low Hgb but will continue to Monitor and likely start Heparin 5,000 units Code Status: FULL CODE  Family Communication: No family present at bedside  Disposition Plan: Likely Transfer [facility] later this week per Urology's recommendations  Status is: Inpatient  Remains inpatient appropriate because:Unsafe d/c plan, IV treatments appropriate due to intensity of illness or inability to take PO and Inpatient level of care appropriate due to severity of illness   Dispo: The patient is from: Home              Anticipated d/c is to: SNF              Anticipated d/c date is: > 3 days              Patient currently is not medically stable to d/c.  Consultants:   PCCM Transfer  Urology  Interventional Radiology    Procedures:  Procedure(s): 1.  Washout and debridement of penile and perineal abscess  Antimicrobials:  Anti-infectives (From admission, onward)   Start     Dose/Rate Route Frequency Ordered Stop   08/09/20 1400  metroNIDAZOLE (FLAGYL) tablet 500 mg        500 mg Oral Every 8 hours 08/09/20 0956     08/06/20 1530  cefTRIAXone (ROCEPHIN) 2 g in sodium chloride 0.9 % 100 mL IVPB        2 g 200 mL/hr over 30 Minutes Intravenous Every 24 hours 08/06/20 1432     08/04/20 2200  emtricitabine-tenofovir AF (DESCOVY) 200-25 MG per tablet 1 tablet        1 tablet Oral Daily at bedtime 08/04/20 0926     08/04/20 1015  dolutegravir (TIVICAY) tablet 50 mg        50 mg Oral Daily 08/04/20 0926     08/03/20 1800  piperacillin-tazobactam (ZOSYN) IVPB 3.375 g  Status:  Discontinued        3.375 g 12.5 mL/hr over 240 Minutes Intravenous Every 8 hours 08/03/20 1719 08/06/20 1432   08/02/20 1000  vancomycin (VANCOCIN) IVPB 1000 mg/200 mL premix  Status:  Discontinued        1,000 mg 200 mL/hr over 60 Minutes Intravenous Every 12 hours 08/02/20 0354 08/04/20 0924   08/02/20 0600  ceFEPIme (MAXIPIME) 2 g in sodium chloride 0.9 % 100 mL IVPB  Status:  Discontinued         2 g 200 mL/hr over 30 Minutes Intravenous Every 8 hours 08/01/20 2148 08/02/20 0345   08/02/20 0600  piperacillin-tazobactam (ZOSYN) IVPB 3.375 g  Status:  Discontinued        3.375 g 12.5 mL/hr over 240 Minutes Intravenous Every 8 hours 08/02/20 0328 08/03/20 1719   08/01/20 2230  vancomycin (VANCOCIN) IVPB 1000 mg/200 mL premix        1,000 mg 200 mL/hr over 60 Minutes Intravenous  Once 08/01/20 2225 08/02/20 0028   08/01/20 2230  clindamycin (CLEOCIN) IVPB 600 mg        600 mg 100 mL/hr over 30 Minutes Intravenous  Once 08/01/20 2225 08/01/20 2320   08/01/20 2045  ceFEPIme (MAXIPIME) 2 g in sodium chloride 0.9 % 100 mL IVPB        2 g 200 mL/hr over 30 Minutes Intravenous  Once 08/01/20 2041 08/01/20 2249       Subjective: Seen and examined at bedside and he was doing okay and thinks he slept little bit better last night.  Heart rate was racing but he did not really feel it.  No nausea or vomiting.  Denies lightheadedness or dizziness.  No other concerns or complaints at this time  Objective: Vitals:   08/09/20 1112 08/09/20 1120 08/09/20 1145 08/09/20 1609  BP: (!) 136/99 (!) 137/93 (!) 122/94 (!) 141/104  Pulse:   100   Resp:   18   Temp:   (!) 97.5 F (36.4 C) 98.1 F (36.7 C)  TempSrc:   Oral Oral  SpO2:    100%  Weight:      Height:        Intake/Output Summary (Last 24 hours) at 08/09/2020 1718 Last data filed at 08/09/2020 1100 Gross per 24 hour  Intake 1040 ml  Output 1500 ml  Net -460 ml   Filed Weights   08/06/20 0514 08/07/20 0500 08/08/20 0641  Weight: 93.3 kg 92.4 kg 94.9 kg   Examination: Physical Exam:  Constitutional: WN/WD obese chronically ill-appearing Caucasian male currently in no acute distress appears calm Eyes: Lids and conjunctivae normal, sclerae anicteric  ENMT: External Ears, Nose appear normal. Grossly normal hearing. Neck: Appears normal, supple, no cervical masses, normal ROM, no appreciable thyromegaly; no JVD Respiratory:  Diminished to auscultation bilaterally, no wheezing, rales, rhonchi or crackles. Normal respiratory effort and patient is not tachypenic. No accessory muscle use.  Cardiovascular: Irregularly irregular and tachycardic, no murmurs / rubs / gallops. S1 and S2 auscultated.  Trace extremity edema Abdomen: Soft, non-tender, distended secondary to body habitus. Bowel sounds positive.  GU: Deferred. Musculoskeletal: No clubbing / cyanosis of digits/nails.  Has a right BKA Skin: His multiple pressures also was noted and one on his left hip. No induration; Warm and dry.  Neurologic: CN 2-12 grossly intact with no focal deficits. Romberg sign and cerebellar reflexes not assessed.  Psychiatric: Normal judgment and insight. Alert and oriented x 3. Normal mood and appropriate affect.   Data Reviewed: I have personally reviewed following labs and imaging studies  CBC: Recent Labs  Lab 08/04/20 0052 08/06/20 0237 08/07/20 0144 08/08/20 0223 08/09/20 0142  WBC 21.5* 14.4* 14.5* 13.8* 12.6*  NEUTROABS  --   --   --  10.9* 10.0*  HGB 6.7* 8.1* 7.7* 7.1* 8.4*  HCT 20.1* 24.3* 24.6* 23.1* 26.6*  MCV 84.5 85.9 89.1 90.9 89.6  PLT 342 410* 375 369 [id number]   Basic Metabolic Panel: Recent Labs  Lab 08/03/20 0056 08/04/20 0052 08/06/20 0237 08/07/20 0144 08/08/20 0223 08/09/20 0142  NA 131* 131* 135 138 139 140  K 3.2* 4.2 4.7 4.6 5.0 5.5*  CL 101 101 104 108 110 112*  CO2 23 22 22 23 23 22   GLUCOSE 160* 206* 232* 191* 148* 198*  BUN 28* 29* 36* 35* 32* 28*  CREATININE 1.56*  1.84* 2.42* 2.20* 1.85* 1.48*  CALCIUM 7.2* 7.3* 7.4* 7.3* 7.3* 7.2*  MG 1.9 2.4  --   --  2.4 2.5*  PHOS 3.6 3.6  --   --  3.2 3.5   GFR: Estimated Creatinine Clearance: 68.9 mL/min (A) (by C-G formula based on SCr of 1.48 mg/dL (H)). Liver Function Tests: Recent Labs  Lab 08/03/20 0056 08/06/20 0237 08/07/20 0144 08/08/20 0223 08/09/20 0142  AST 16 13* 14* 15 23  ALT 12 13 11 15 18   ALKPHOS 74 75 67 66 81  BILITOT  0.9 0.2* 0.4 0.4 0.2*  PROT 5.2* 5.5* 5.3* 5.5* 6.0*  ALBUMIN 1.5* 1.4* 1.3* 1.4* 1.5*   No results for input(s): LIPASE, AMYLASE in the last 168 hours. No results for input(s): AMMONIA in the last 168 hours. Coagulation Profile: No results for input(s): INR, PROTIME in the last 168 hours. Cardiac Enzymes: No results for input(s): CKTOTAL, CKMB, CKMBINDEX, TROPONINI in the last 168 hours. BNP (last 3 results) No results for input(s): PROBNP in the last 8760 hours. HbA1C: No results for input(s): HGBA1C in the last 72 hours. CBG: Recent Labs  Lab 08/08/20 1141 08/08/20 1613 08/08/20 2052 08/09/20 0759 08/09/20 1144  GLUCAP 206* 206* 232* 146* 207*   Lipid Profile: No results for input(s): CHOL, HDL, LDLCALC, TRIG, CHOLHDL, LDLDIRECT in the last 72 hours. Thyroid Function Tests: No results for input(s): TSH, T4TOTAL, FREET4, T3FREE, THYROIDAB in the last 72 hours. Anemia Panel: No results for input(s): VITAMINB12, FOLATE, FERRITIN, TIBC, IRON, RETICCTPCT in the last 72 hours. Sepsis Labs: No results for input(s): PROCALCITON, LATICACIDVEN in the last 168 hours.  Recent Results (from the past 240 hour(s))  Resp Panel by RT-PCR (Flu A&B, Covid) Nasopharyngeal Swab     Status: None   Collection Time: 08/01/20 10:15 PM   Specimen: Nasopharyngeal Swab; Nasopharyngeal(NP) swabs in vial transport medium  Result Value Ref Range Status   SARS Coronavirus 2 by RT PCR NEGATIVE NEGATIVE Final    Comment: (NOTE) SARS-CoV-2 target nucleic acids are NOT DETECTED.  The SARS-CoV-2 RNA is generally detectable in upper respiratory specimens during the acute phase of infection. The lowest concentration of SARS-CoV-2 viral copies this assay can detect is 138 copies/mL. A negative result does not preclude SARS-Cov-2 infection and should not be used as the sole basis for treatment or other patient management decisions. A negative result may occur with  improper specimen collection/handling,  submission of specimen other than nasopharyngeal swab, presence of viral mutation(s) within the areas targeted by this assay, and inadequate number of viral copies(<138 copies/mL). A negative result must be combined with clinical observations, patient history, and epidemiological information. The expected result is Negative.  Fact Sheet for Patients:  EntrepreneurPulse.com.au  Fact Sheet for Healthcare Providers:  IncredibleEmployment.be  This test is no t yet approved or cleared by the [place] FDA and  has been authorized for detection and/or diagnosis of SARS-CoV-2 by FDA under an Emergency Use Authorization (EUA). This EUA will remain  in effect (meaning this test can be used) for the duration of the COVID-19 declaration under Section 564(b)(1) of the Act, 21 U.S.C.section 360bbb-3(b)(1), unless the authorization is terminated  or revoked sooner.       Influenza A by PCR NEGATIVE NEGATIVE Final   Influenza B by PCR NEGATIVE NEGATIVE Final    Comment: (NOTE) The Xpert Xpress SARS-CoV-2/FLU/RSV plus assay is intended as an aid in the diagnosis of influenza from Nasopharyngeal swab specimens and should not be used  as a sole basis for treatment. Nasal washings and aspirates are unacceptable for Xpert Xpress SARS-CoV-2/FLU/RSV testing.  Fact Sheet for Patients: EntrepreneurPulse.com.au  Fact Sheet for Healthcare Providers: IncredibleEmployment.be  This test is not yet approved or cleared by the [place] FDA and has been authorized for detection and/or diagnosis of SARS-CoV-2 by FDA under an Emergency Use Authorization (EUA). This EUA will remain in effect (meaning this test can be used) for the duration of the COVID-19 declaration under Section 564(b)(1) of the Act, 21 U.S.C. section 360bbb-3(b)(1), unless the authorization is terminated or revoked.  Performed at [facility], [place] [address], [place], [place] [address]   Blood Culture (routine x 2)     Status: None   Collection Time: 08/01/20 10:40 PM   Specimen: BLOOD  Result Value Ref Range Status   Specimen Description BLOOD SITE NOT SPECIFIED  Final   Special Requests   Final    BOTTLES DRAWN AEROBIC AND ANAEROBIC Blood Culture adequate volume   Culture   Final    NO GROWTH 5 DAYS Performed at [facility], [address], [place], [place] [address]    Report Status 08/06/2020 FINAL  Final  Blood Culture (routine x 2)     Status: None   Collection Time: 08/01/20 10:40 PM   Specimen: BLOOD  Result Value Ref Range Status   Specimen Description BLOOD SITE NOT SPECIFIED  Final   Special Requests   Final    BOTTLES DRAWN AEROBIC AND ANAEROBIC Blood Culture adequate volume   Culture   Final    NO GROWTH 5 DAYS Performed at [facility], [place] [address] [place], [place] [address]    Report Status 08/06/2020 FINAL  Final  Aerobic/Anaerobic Culture (surgical/deep wound)     Status: None   Collection Time: 08/02/20  1:37 AM   Specimen: Abscess  Result Value Ref Range Status   Specimen Description ABSCESS  Final   Special Requests PENILE  Final   Gram Stain   Final    ABUNDANT WBC PRESENT, PREDOMINANTLY PMN ABUNDANT GRAM POSITIVE COCCI RARE GRAM POSITIVE RODS    Culture   Final    FEW PROTEUS MIRABILIS RARE GROUP B STREP(S.AGALACTIAE)ISOLATED TESTING AGAINST S. AGALACTIAE NOT ROUTINELY PERFORMED DUE TO PREDICTABILITY OF AMP/PEN/VAN SUSCEPTIBILITY. FEW PREVOTELLA SPECIES BETA LACTAMASE POSITIVE Performed at [facility], [place] [address], [place],  [address]    Report Status 08/07/2020 FINAL  Final   Organism ID, Bacteria PROTEUS MIRABILIS  Final      Susceptibility   Proteus mirabilis - MIC*    AMPICILLIN >=32 RESISTANT Resistant     CEFAZOLIN 8 SENSITIVE Sensitive     CEFEPIME <=0.12 SENSITIVE Sensitive     CEFTAZIDIME <=1 SENSITIVE Sensitive     CEFTRIAXONE <=0.25 SENSITIVE  Sensitive     CIPROFLOXACIN <=0.25 SENSITIVE Sensitive     GENTAMICIN <=1 SENSITIVE Sensitive     IMIPENEM 2 SENSITIVE Sensitive     TRIMETH/SULFA <=20 SENSITIVE Sensitive     AMPICILLIN/SULBACTAM >=32 RESISTANT Resistant     PIP/TAZO <=4 SENSITIVE Sensitive     * FEW PROTEUS MIRABILIS  MRSA PCR Screening     Status: None   Collection Time: 08/02/20  9:43 AM   Specimen: Nasal Mucosa; Nasopharyngeal  Result Value Ref Range Status   MRSA by PCR NEGATIVE NEGATIVE Final    Comment:        The GeneXpert MRSA Assay (FDA approved for NASAL specimens only),  is one component of a comprehensive MRSA colonization surveillance program. It is not intended to diagnose MRSA infection nor to guide or monitor treatment for MRSA infections. Performed at [facility], [address] [place], [place] [address]   Surgical pcr screen     Status: None   Collection Time: 08/05/20  5:08 AM   Specimen: Nasal Mucosa; Nasal Swab  Result Value Ref Range Status   MRSA, PCR NEGATIVE NEGATIVE Final   Staphylococcus aureus NEGATIVE NEGATIVE Final    Comment: (NOTE) The Xpert SA Assay (FDA approved for NASAL specimens in patients 76 years of age and older), is one component of a comprehensive surveillance program. It is not intended to diagnose infection nor to guide or monitor treatment. Performed at [facility], [address] [place], [place] [address]      RN Pressure Injury Documentation: Pressure Injury 03/24/17 Stage II -  Partial thickness loss of dermis presenting as a shallow open ulcer with a red, pink wound bed without slough. (Active)  03/24/17 1131  Location: Elbow  Location Orientation: Right  Staging: Stage II -  Partial thickness loss of dermis presenting as a shallow open ulcer with a red, pink wound bed without slough.  Wound Description (Comments):   Present on Admission: Yes     Pressure Injury 08/05/20 Hip Anterior;Left Stage 4 - Full thickness tissue loss with  exposed bone, tendon or muscle. (Active)  08/05/20 2000  Location: Hip  Location Orientation: Anterior;Left  Staging: Stage 4 - Full thickness tissue loss with exposed bone, tendon or muscle.  Wound Description (Comments):   Present on Admission: Yes     Pressure Injury 08/05/20 Heel Left Deep Tissue Pressure Injury - Purple or maroon localized area of discolored intact skin or blood-filled blister due to damage of underlying soft tissue from pressure and/or shear. (Active)  08/05/20 2000  Location: Heel  Location Orientation: Left  Staging: Deep Tissue Pressure Injury - Purple or maroon localized area of discolored intact skin or blood-filled blister due to damage of underlying soft tissue from pressure and/or shear.  Wound Description (Comments):   Present on Admission: Yes     Pressure Injury 08/07/20 Sacrum Mid;Lower Stage 2 -  Partial thickness loss of dermis presenting as a shallow open injury with a red, pink wound bed without slough. (Active)  08/07/20 0800  Location: Sacrum  Location Orientation: Mid;Lower  Staging: Stage 2 -  Partial thickness loss of dermis presenting as a shallow open injury with a red, pink wound bed without slough.  Wound Description (Comments):   Present on Admission:      Estimated body mass index is 31.81 kg/m as calculated from the following:   Height as of this encounter: 5\' 8"  (1.727 m).   Weight as of this encounter: 94.9 kg.  Malnutrition Type:  Nutrition Problem: Increased nutrient needs Etiology: wound healing   Malnutrition Characteristics:  Signs/Symptoms: estimated needs   Nutrition Interventions:  Interventions: Ensure Enlive (each supplement provides 350kcal and 20 grams of protein),MVI     Radiology Studies: No results found. Scheduled Meds: . sodium chloride   Intravenous Once  . atorvastatin  40 mg Oral Daily  . Chlorhexidine Gluconate Cloth  6 each Topical Daily  . dolutegravir  50 mg Oral Daily  .  emtricitabine-tenofovir AF  1 tablet Oral QHS  . feeding supplement (GLUCERNA SHAKE)  237 mL Oral TID BM  . insulin aspart  0-5 Units Subcutaneous QHS  . insulin aspart  0-9 Units Subcutaneous TID WC  . insulin glargine  15 Units Subcutaneous Daily  . metoprolol tartrate  100 mg Oral BID  . metroNIDAZOLE  500 mg Oral Q8H  . multivitamin with minerals  1 tablet Oral Daily  . nutrition supplement (JUVEN)  1 packet Oral BID BM  . sertraline  100 mg Oral BID  . sodium chloride flush  5 mL Intracatheter Q8H   Continuous Infusions: . cefTRIAXone (ROCEPHIN)  IV 2 g (08/09/20 1055)    LOS: 7 days   Kerney Elbe, DO [facility] PAGER is on [place]  If 7PM-7AM, please contact night-coverage [email or web address]

## 2020-08-10 DIAGNOSIS — I5020 Unspecified systolic (congestive) heart failure: Secondary | ICD-10-CM | POA: Diagnosis not present

## 2020-08-10 DIAGNOSIS — N493 Fournier gangrene: Secondary | ICD-10-CM | POA: Diagnosis not present

## 2020-08-10 DIAGNOSIS — R000 Tachycardia, unspecified: Secondary | ICD-10-CM | POA: Diagnosis not present

## 2020-08-10 DIAGNOSIS — I4891 Unspecified atrial fibrillation: Secondary | ICD-10-CM | POA: Diagnosis not present

## 2020-08-10 DIAGNOSIS — N4821 Abscess of corpus cavernosum and penis: Secondary | ICD-10-CM | POA: Diagnosis not present

## 2020-08-10 DIAGNOSIS — I480 Paroxysmal atrial fibrillation: Secondary | ICD-10-CM | POA: Diagnosis not present

## 2020-08-10 LAB — CBC WITH DIFFERENTIAL/PLATELET
Abs Immature Granulocytes: 0.09 10*3/uL — ABNORMAL HIGH (ref 0.00–0.07)
Basophils Absolute: 0.1 10*3/uL (ref 0.0–0.1)
Basophils Relative: 0 %
Eosinophils Absolute: 0.2 10*3/uL (ref 0.0–0.5)
Eosinophils Relative: 1 %
HCT: 27.8 % — ABNORMAL LOW (ref 39.0–52.0)
Hemoglobin: 8.8 g/dL — ABNORMAL LOW (ref 13.0–17.0)
Immature Granulocytes: 1 %
Lymphocytes Relative: 9 %
Lymphs Abs: 1.3 10*3/uL (ref 0.7–4.0)
MCH: 28.8 pg (ref 26.0–34.0)
MCHC: 31.7 g/dL (ref 30.0–36.0)
MCV: 90.8 fL (ref 80.0–100.0)
Monocytes Absolute: 0.5 10*3/uL (ref 0.1–1.0)
Monocytes Relative: 3 %
Neutro Abs: 12.3 10*3/uL — ABNORMAL HIGH (ref 1.7–7.7)
Neutrophils Relative %: 86 %
Platelets: 318 10*3/uL (ref 150–400)
RBC: 3.06 MIL/uL — ABNORMAL LOW (ref 4.22–5.81)
RDW: 18.3 % — ABNORMAL HIGH (ref 11.5–15.5)
WBC: 14.4 10*3/uL — ABNORMAL HIGH (ref 4.0–10.5)
nRBC: 0 % (ref 0.0–0.2)

## 2020-08-10 LAB — COMPREHENSIVE METABOLIC PANEL
ALT: 19 U/L (ref 0–44)
AST: 20 U/L (ref 15–41)
Albumin: 1.6 g/dL — ABNORMAL LOW (ref 3.5–5.0)
Alkaline Phosphatase: 87 U/L (ref 38–126)
Anion gap: 8 (ref 5–15)
BUN: 24 mg/dL — ABNORMAL HIGH (ref 6–20)
CO2: 22 mmol/L (ref 22–32)
Calcium: 7.7 mg/dL — ABNORMAL LOW (ref 8.9–10.3)
Chloride: 112 mmol/L — ABNORMAL HIGH (ref 98–111)
Creatinine, Ser: 1.33 mg/dL — ABNORMAL HIGH (ref 0.61–1.24)
GFR, Estimated: >60 mL/min (ref >60)
Glucose, Bld: 193 mg/dL — ABNORMAL HIGH (ref 70–99)
Potassium: 5.5 mmol/L — ABNORMAL HIGH (ref 3.5–5.1)
Sodium: 142 mmol/L (ref 135–145)
Total Bilirubin: 0.4 mg/dL (ref 0.3–1.2)
Total Protein: 6 g/dL — ABNORMAL LOW (ref 6.5–8.1)

## 2020-08-10 LAB — PHOSPHORUS: Phosphorus: 3.2 mg/dL (ref 2.5–4.6)

## 2020-08-10 LAB — GLUCOSE, CAPILLARY
Glucose-Capillary: 134 mg/dL — ABNORMAL HIGH (ref 70–99)
Glucose-Capillary: 110 mg/dL — ABNORMAL HIGH (ref 70–99)
Glucose-Capillary: 157 mg/dL — ABNORMAL HIGH (ref 70–99)
Glucose-Capillary: 172 mg/dL — ABNORMAL HIGH (ref 70–99)

## 2020-08-10 LAB — TSH: TSH: 4.775 u[IU]/mL — ABNORMAL HIGH (ref 0.350–4.500)

## 2020-08-10 LAB — T4, FREE: Free T4: 0.91 ng/dL (ref 0.61–1.12)

## 2020-08-10 LAB — MAGNESIUM: Magnesium: 2.1 mg/dL (ref 1.7–2.4)

## 2020-08-10 MED ORDER — SODIUM ZIRCONIUM CYCLOSILICATE 10 G PO PACK
10.0000 g | PACK | Freq: Once | ORAL | Status: DC
  Filled 2020-08-10: qty 1

## 2020-08-10 MED ORDER — SODIUM POLYSTYRENE SULFONATE 15 GM/60ML PO SUSP
30.0000 g | Freq: Once | ORAL | Status: AC
  Administered 2020-08-10: 30 g via ORAL
  Filled 2020-08-10: qty 120

## 2020-08-10 NOTE — Progress Notes (Signed)
Progress Note  Patient Name: Francisco Hutchinson Date of Encounter: 08/10/2020  Primary Cardiologist: Jens Som- NEW  Subjective   49 yo M with a history of DM with HTN, Prior Stroke, HIV on HAART, and Hx of Fournier's Gangrene s/p drainage 08/02/20 who developed with new atrial fibrillation with new HFmrEF.  Patient notes that he is doing well.  Since day notes no changes, does feel palpitations when his rates go higher.   No chest pain or pressure .  No SOB/DOE and no PND/Orthopnea. No palpitations or syncope.  Inpatient Medications    Scheduled Meds:  sodium chloride   Intravenous Once   atorvastatin  40 mg Oral Daily   Chlorhexidine Gluconate Cloth  6 each Topical Daily   diltiazem  30 mg Oral Q6H   dolutegravir  50 mg Oral Daily   emtricitabine-tenofovir AF  1 tablet Oral QHS   feeding supplement (GLUCERNA SHAKE)  237 mL Oral TID BM   insulin aspart  0-5 Units Subcutaneous QHS   insulin aspart  0-9 Units Subcutaneous TID WC   insulin glargine  15 Units Subcutaneous Daily   metoprolol tartrate  100 mg Oral BID   metroNIDAZOLE  500 mg Oral Q8H   multivitamin with minerals  1 tablet Oral Daily   nutrition supplement (JUVEN)  1 packet Oral BID BM   sertraline  100 mg Oral BID   sodium chloride flush  5 mL Intracatheter Q8H   Continuous Infusions:  cefTRIAXone (ROCEPHIN)  IV 2 g (08/09/20 1055)   PRN Meds: docusate sodium, HYDROmorphone, ondansetron (ZOFRAN) IV, polyethylene glycol   Vital Signs    Vitals:   08/09/20 2030 08/10/20 0027 08/10/20 0425 08/10/20 0620  BP: (!) 141/105 (!) 126/97 (!) 137/107 (!) 138/108  Pulse: 64 91 84   Resp: 20 18 18    Temp: 98.4 F (36.9 C) 98.2 F (36.8 C) 98.2 F (36.8 C)   TempSrc: Oral Oral Oral   SpO2: 99% 98% 98%   Weight:   93.9 kg   Height:        Intake/Output Summary (Last 24 hours) at 08/10/2020 0814 Last data filed at 08/10/2020 08/12/2020 Gross per 24 hour  Intake --  Output 2075 ml  Net -2075 ml   Filed Weights    08/07/20 0500 08/08/20 0641 08/10/20 0425  Weight: 92.4 kg 94.9 kg 93.9 kg    Telemetry    A fib rates under 105 - Personally Reviewed  ECG    Heart rate 113 bpm - Personally Reviewed  Physical Exam   GEN: No acute distress.   Neck: No JVD Cardiac: irregularly irregular, no murmurs, rubs, or gallops.  Respiratory: Clear to auscultation bilaterally. GI: Soft, nontender, non-distended  MS: No edema; No deformity. Neuro:  Nonfocal  Psych: Normal affect   Labs    Chemistry Recent Labs  Lab 08/08/20 0223 08/09/20 0142 08/10/20 0122  NA 139 140 142  K 5.0 5.5* 5.5*  CL 110 112* 112*  CO2 23 22 22   GLUCOSE 148* 198* 193*  BUN 32* 28* 24*  CREATININE 1.85* 1.48* 1.33*  CALCIUM 7.3* 7.2* 7.7*  PROT 5.5* 6.0* 6.0*  ALBUMIN 1.4* 1.5* 1.6*  AST 15 23 20   ALT 15 18 19   ALKPHOS 66 81 87  BILITOT 0.4 0.2* 0.4  GFRNONAA 45* 58* >60  ANIONGAP 6 6 8      Hematology Recent Labs  Lab 08/08/20 0223 08/09/20 0142 08/10/20 0122  WBC 13.8* 12.6* 14.4*  RBC 2.54* 2.97* 3.06*  HGB 7.1* 8.4* 8.8*  HCT 23.1* 26.6* 27.8*  MCV 90.9 89.6 90.8  MCH 28.0 28.3 28.8  MCHC 30.7 31.6 31.7  RDW 18.1* 18.0* 18.3*  PLT 369 364 318    Cardiac EnzymesNo results for input(s): TROPONINI in the last 168 hours. No results for input(s): TROPIPOC in the last 168 hours.   BNPNo results for input(s): BNP, PROBNP in the last 168 hours.   DDimer No results for input(s): DDIMER in the last 168 hours.   Radiology    No results found.  Cardiac Studies   IMPRESSIONS   1. Left ventricular ejection fraction, by estimation, is 40 to 45%. The  left ventricle has mildly decreased function. The left ventricle  demonstrates global hypokinesis. Left ventricular diastolic function could  not be evaluated.   2. Right ventricular systolic function is mildly reduced. The right  ventricular size is mildly enlarged. There is mildly elevated pulmonary  artery systolic pressure. The estimated right  ventricular systolic  pressure is 123XX123 mmHg.   3. Left atrial size was mildly dilated.   4. The mitral valve is grossly normal. Trivial mitral valve  regurgitation. No evidence of mitral stenosis.   5. The aortic valve is tricuspid. Aortic valve regurgitation is not  visualized. No aortic stenosis is present.   6. The inferior vena cava is dilated in size with <50% respiratory  variability, suggesting right atrial pressure of 15 mmHg.   Patient Profile     49 y.o. male Fournier's Gangrene who presents with new AF  Assessment & Plan    Paroxysmal Atrial Fibrillation DM with HTN, CVA - CHADSVASC=5 - Post urological surgery, will need anticoagulation with Xarelto when safe  - Continue rate control with metoprolol; given HFrEF will trial of diltiazem.  If heart rates increase, and given no AC; would defer AAD and start digoxin 0.125; with close monitoring of levels  Heart Failure mildly reduced Ejection Fraction  - NYHA class I, Stage B, euvolemic, etiology from AF (likely) - Diuretic regimen: none - Strict I/Os, daily weights, and fluid restriction of < 2 L  - Replace electrolytes PRN and keep K>4 and Mg>2. - metoprolol- with stable rate control would convert to succinate  - ARB start when stable from kidney and K perspective - digoxing and CCB addressed above  HIV and HLD - continue current statin  For questions or updates, please contact [facility] Please consult [email or web address] for contact info under Cardiology/STEMI.      Signed, Werner Lean, MD  08/10/2020, 8:14 AM

## 2020-08-10 NOTE — Plan of Care (Signed)
  Problem: Clinical Measurements: Goal: Will remain free from infection Outcome: Progressing   Problem: Nutrition: Goal: Adequate nutrition will be maintained Outcome: Progressing   

## 2020-08-10 NOTE — Progress Notes (Addendum)
PROGRESS NOTE    Francisco Hutchinson  [id number] DOB: July 06, 1971 DOA: 08/01/2020 PCP: [name], MD   Brief Narrative: The patient is a 49 year old nursing home resident with a past medical history significant for but not limited to hypertension, HIV, CVA, diabetes mellitus type 2, anxiety and depression, as well as other comorbidities who presented to the emergency room with septic shock due to Fournier's gangrene neurology is consulted and patient was taken urgently to the OR for incision and drainage.  He was then taken back to the OR on 08/05/2020 for a washout and debridement of the penile and perineal abscess at the patient has had necrotizing fasciitis of the penis and perineum.  He was transferred to [facility] service 08/05/2020.  Wound culture wound 40 is in blood cultures have been negative.  MRSA PCR was negative and Covid was negative as well as flu.  He was started on IV Zosyn and changed to IV Ceftriaxone.  He underwent a suprapubic catheter placement on 08/06/2020 by interventional radiology  On 08/08/2020 his blood count had dropped to 7.1 so he be typed and screened and transfused 1 unit of PRBCs as his blood count has been slowly trending down.  Repeat blood count 08/09/2020 is now 8.4/26.6 and stable.  Renal function is improving but he was little hyperkalemic.  He is given bolus of Lokelma.  He was found to be in new onset atrial fibrillation with RVR and was given metoprolol push.  Cardiology yesterday given his new A. fib with RVR  Cardiology evaluated for his proximal atrial fibrillation with RVR and because this occurred in the setting of Fournier's gangrene they checked a TSH for completeness and recommended continue metoprolol 100 mg p.o. twice daily for rate control and also added Cardizem 30 mg every 6 hours and recommended possibly changing to Cardizem CD 120 mg today if heart rate and blood pressure remained stable.  His CHA2DS2-VASc score was 5 and so they recommended  starting anticoagulation but only when it is okay with urology and when his urological procedures are improved and they are recommending Xarelto.  Currently have started the transfer process to [facility] for reconstructive surgery to the care of Dr. Francesca Jewett per urology's recommendations  Assessment & Plan:   Active Problems:   Fourniers gangrene   Penile abscess   Pressure injury of skin   Hypotension   Tachycardia   Fournier's Gangrene and necrotizing fasciitis of the penis and perineum  -Presented with septic shock and is admitted to [facility] and and transferred to [facility] service 08/05/2020 -He is status post debridement twice by Urology.  Culture growing Proteus so will continue IV Zosyn but chjanged to IV Ceftriaxone and also added Flagyl; need to continue monitor carefully and if necessary will transition back to IV Zosyn -Patient scheduled to have suprapubic catheter placed by interventional radiology and this was done 08/06/20 -Patient afebrile, WBC was trending down from Admission (27.5) and has now stabilized and has now gone from 14.4 -> 14.5 -> 13.8 -> 12.6 slightly worse at 14.4 -Urology recommending changing the dressings wet-to-dry daily as his granulation tissue looks fairly well.  Urology recommending transferred to [facility] later this week to the care of Dr. Francesca Jewett for reconstruction and process has been initiated and per my discussion with the [facility] transfer coordinator as he is at capacity and recommending checking back next week -We will need to discuss with neurology about starting a heparin drip if he remains hospitalized here off of anticoagulation given his  history of CVA and CHA2DS2-VASc score of 5  **ADDENDUM: I spoke with urology at [facility] Dr. Stanford Breed who is the fellow there and he feels that they do not have any intermediate beds currently still they cannot accept the patient at this time.  He recommended calling back after Sunday when his partner Dr. Francesca Jewett is back from vacation and  recommended that the urology team call to figure out what exactly the patient will require in terms of reconstruction.  We will reach out to our urology team to have them call after Sunday for transfer for this patient as it likely needs to be a urology to urology consult  HIV  -C/w Dolutegravir 50 mg po daily and Emticitabine-Tenofovir 200-25 mg po daily  Hx of CVA -Was on ASA pta and has ben held for now and will resume per cardiology recommendations n -C/w Atorvastatin 40 mg po Daily   Uncontrolled Diabetes Mellitus Type 2 -C/w Lantus 15 units sq daily; patient takes dulaglutide injection 1.5 mg in the skin once a week, Jardiance 10 mg p.o. daily, and Lantus 55 units in the skin subcu at bedtime -C/w Sensitive Novolog SSI AC -CBG's ranging from 146-232 -HbA1c is 8.8 -Continue to Monitor Blood Sugars carefully and adjust insulin as Necessary  Anxiety and Depression -C/w Sertraline 100 mg po BID  Hyponatremia -Improved. Na+ is now 140 -Continue to Monitor and Trend -IV fluid hydration is now stopped -Repeat CMP in the AM   HTN -C/w Metoprolol 100 mg po BID -Currently his hydrochlorothiazide 25 mg p.o. daily currently being held given that he is getting IV fluid hydration -His amlodipine 5 mg p.o. daily also held given his lower blood pressures as his last blood pressure was 141/104 -continue monitor blood pressures per protocol  HLD -C/w Atorvastatin 40 mg p.o. daily; patient no longer taking fenofibrate   AKI on CKD Stage 3a -Patient's BUN/Cr went from 28/1.37 -> 28/1.56 -> 29/1.84 -> 36/2.42 -> 35/2.20 -> 32/1.85 today it is improving and went to 28/1.48 today to 24/1.33 -IV fluid hydration is now stopped -Avoid Nephrotoxic Medications, Contrast Dyes, Hypotension and Renally adjust Medications -Continue to Monitor and Trend Renal Fxn and Repeat CMP in the AM   Hyperkalemia -Patient had a mildly elevated potassium at 5.5 and today -Was trending up for last 3  days -we will give a dose of Lokelma 10 g and reordered it today but patient refused so will order Kayexalate grams -Continue monitor trend and repeat CMP in a.m.  Normocytic Anemia/Anemia of Chronic Disease -Patient's Hgb/Hct went from 8.7/28.1 -> 7.0/20.8 -> 6.7/20.1 -> 8.1/24.3 -> 7.7/24.6 and yesterday his hemoglobin dropped to 7.1/23.1 so we will type and screen and transfuse 1 unit PRBCs; now Hgb/Hct is now 8.4/26.6 -Patient to get his second unit of blood this hospitalization -Continue to Monitor for S/Sx of Bleeding; Currently no overt bleeding noted -Repeat CBC in the AM  New Onset A. fib with RVR, improved now -Patient had heart rates in the 130s to 140s yesterday now in the 80s and 90s -EKG done in confirmed A. Fib -Echo done recently showed an EF of 40 to 45% -Avoiding Cardizem given his low EF cardiology wants to trial p.o. Cardizem and started him on 30 every 6 scheduled -Continue with metoprolol tartrate 100 g p.o. twice daily and given 5 mg of IV metoprolol push -Cardiology was consulted yesterday and they are recommending trialing Cardizem p.o. in addition to metoprolol -Patient's CHA2DS2-VASc score is at least 5 for CHF, hypertension,  diabetes, and CVA -Defer anticoagulation to cardiology and further work-up for cardiology when they are see the patient -Heart rate is improved and is in the low 100s -Troponin was obtained and first was 13 and next one was 17 -Continue monitor on telemetry -He will need anticoagulation given elevated CHA2DS2-VASc score but cardiology recommending holding off p.o. anticoagulation until his urological procedures are done and recommending Xarelto.  In the interim may need to start heparin while he is here but will need to be okayed by urology given his recent procedures.  Pressure Injuries, poA Pressure Injury 03/24/17 Stage II -  Partial thickness loss of dermis presenting as a shallow open ulcer with a red, pink wound bed without slough.  (Active)  03/24/17 1131  Location: Elbow  Location Orientation: Right  Staging: Stage II -  Partial thickness loss of dermis presenting as a shallow open ulcer with a red, pink wound bed without slough.  Wound Description (Comments):   Present on Admission: Yes     Pressure Injury 08/05/20 Hip Anterior;Left Stage 4 - Full thickness tissue loss with exposed bone, tendon or muscle. (Active)  08/05/20 2000  Location: Hip  Location Orientation: Anterior;Left  Staging: Stage 4 - Full thickness tissue loss with exposed bone, tendon or muscle.  Wound Description (Comments):   Present on Admission: Yes     Pressure Injury 08/05/20 Heel Left Deep Tissue Pressure Injury - Purple or maroon localized area of discolored intact skin or blood-filled blister due to damage of underlying soft tissue from pressure and/or shear. (Active)  08/05/20 2000  Location: Heel  Location Orientation: Left  Staging: Deep Tissue Pressure Injury - Purple or maroon localized area of discolored intact skin or blood-filled blister due to damage of underlying soft tissue from pressure and/or shear.  Wound Description (Comments):   Present on Admission: Yes     Pressure Injury 08/07/20 Sacrum Mid;Lower Stage 2 -  Partial thickness loss of dermis presenting as a shallow open injury with a red, pink wound bed without slough. (Active)  08/07/20 0800  Location: Sacrum  Location Orientation: Mid;Lower  Staging: Stage 2 -  Partial thickness loss of dermis presenting as a shallow open injury with a red, pink wound bed without slough.  Wound Description (Comments):   Present on Admission:    Obesity -Complicates overall prognosis and care -Estimated body mass index is 31.48 kg/m as calculated from the following:   Height as of this encounter: 5\' 8"  (1.727 m).   Weight as of this encounter: 93.9 kg. -Weight Loss and Dietary Counseling given  DVT prophylaxis: SCDs; Has a Low Hgb but will continue to Monitor and likely start  Heparin 5,000 units Code Status: FULL CODE  Family Communication: No family present at bedside  Disposition Plan: Likely Transfer [facility] later this week per Urology's recommendations.  Processes been initiated  Status is: Inpatient  Remains inpatient appropriate because:Unsafe d/c plan, IV treatments appropriate due to intensity of illness or inability to take PO and Inpatient level of care appropriate due to severity of illness   Dispo: The patient is from: Home              Anticipated d/c is to: SNF              Anticipated d/c date is: > 3 days              Patient currently is not medically stable to d/c.  Consultants:   PCCM Transfer  Urology  Interventional  Radiology    Procedures:  Procedure(s): 1.  Washout and debridement of penile and perineal abscess  Antimicrobials:  Anti-infectives (From admission, onward)   Start     Dose/Rate Route Frequency Ordered Stop   08/09/20 1400  metroNIDAZOLE (FLAGYL) tablet 500 mg        500 mg Oral Every 8 hours 08/09/20 0956     08/06/20 1530  cefTRIAXone (ROCEPHIN) 2 g in sodium chloride 0.9 % 100 mL IVPB        2 g 200 mL/hr over 30 Minutes Intravenous Every 24 hours 08/06/20 1432     08/04/20 2200  emtricitabine-tenofovir AF (DESCOVY) 200-25 MG per tablet 1 tablet        1 tablet Oral Daily at bedtime 08/04/20 0926     08/04/20 1015  dolutegravir (TIVICAY) tablet 50 mg        50 mg Oral Daily 08/04/20 0926     08/03/20 1800  piperacillin-tazobactam (ZOSYN) IVPB 3.375 g  Status:  Discontinued        3.375 g 12.5 mL/hr over 240 Minutes Intravenous Every 8 hours 08/03/20 1719 08/06/20 1432   08/02/20 1000  vancomycin (VANCOCIN) IVPB 1000 mg/200 mL premix  Status:  Discontinued        1,000 mg 200 mL/hr over 60 Minutes Intravenous Every 12 hours 08/02/20 0354 08/04/20 0924   08/02/20 0600  ceFEPIme (MAXIPIME) 2 g in sodium chloride 0.9 % 100 mL IVPB  Status:  Discontinued        2 g 200 mL/hr over 30 Minutes Intravenous Every 8  hours 08/01/20 2148 08/02/20 0345   08/02/20 0600  piperacillin-tazobactam (ZOSYN) IVPB 3.375 g  Status:  Discontinued        3.375 g 12.5 mL/hr over 240 Minutes Intravenous Every 8 hours 08/02/20 0328 08/03/20 1719   08/01/20 2230  vancomycin (VANCOCIN) IVPB 1000 mg/200 mL premix        1,000 mg 200 mL/hr over 60 Minutes Intravenous  Once 08/01/20 2225 08/02/20 0028   08/01/20 2230  clindamycin (CLEOCIN) IVPB 600 mg        600 mg 100 mL/hr over 30 Minutes Intravenous  Once 08/01/20 2225 08/01/20 2320   08/01/20 2045  ceFEPIme (MAXIPIME) 2 g in sodium chloride 0.9 % 100 mL IVPB        2 g 200 mL/hr over 30 Minutes Intravenous  Once 08/01/20 2041 08/01/20 2249       Subjective: Seen and examined at bedside was resting and his heart rate had improved.  No nausea or vomiting.  States that he did not have as given the night and kept waking up.  No other concerns or complaints at this time and feels okay and denies any pain.  Objective: Vitals:   08/10/20 0425 08/10/20 0620 08/10/20 0815 08/10/20 1207  BP: (!) 137/107 (!) 138/108 (!) 122/93 (!) 137/109  Pulse: 84  90 94  Resp: 18  18 18   Temp: 98.2 F (36.8 C)  98.1 F (36.7 C) 98.2 F (36.8 C)  TempSrc: Oral  Oral Oral  SpO2: 98%  95% 99%  Weight: 93.9 kg     Height:        Intake/Output Summary (Last 24 hours) at 08/10/2020 1522 Last data filed at 08/10/2020 1300 Gross per 24 hour  Intake 480 ml  Output 2375 ml  Net -1895 ml   Filed Weights   08/07/20 0500 08/08/20 0641 08/10/20 0425  Weight: 92.4 kg 94.9 kg 93.9 kg  Examination: Physical Exam:  Constitutional: WN/WD chronically ill-appearing Caucasian male currently no acute distress appears calm Eyes: Lids and conjunctivae normal, sclerae anicteric  ENMT: External Ears, Nose appear normal. Grossly normal hearing. Neck: Appears normal, supple, no cervical masses, normal ROM, no appreciable thyromegaly: No appreciable JVD Respiratory: Diminished to auscultation  bilaterally with coarse breath sounds, no wheezing, rales, rhonchi or crackles. Normal respiratory effort and patient is not tachypenic. No accessory muscle use.  Cardiovascular: Irregularly irregular but not tachycardic, no murmurs / rubs / gallops. S1 and S2 auscultated.  Minimal extremity edema Abdomen: Soft, non-tender, distended secondary body habitus. Bowel sounds positive.  GU: Deferred.  Has a right PCN and a Foley in place.  His surgical dressings were not removed and Musculoskeletal: No clubbing / cyanosis of digits/nails.  Has a right BKA Skin: No rashes, lesions, ulcers on limited skin evaluation. No induration; Warm and dry.  Neurologic: CN 2-12 grossly intact with no focal deficits. Romberg sign and cerebellar reflexes not assessed.  Psychiatric: Normal judgment and insight. Alert and oriented x 3. Normal mood and appropriate affect.   Data Reviewed: I have personally reviewed following labs and imaging studies  CBC: Recent Labs  Lab 08/06/20 0237 08/07/20 0144 08/08/20 0223 08/09/20 0142 08/10/20 0122  WBC 14.4* 14.5* 13.8* 12.6* 14.4*  NEUTROABS  --   --  10.9* 10.0* 12.3*  HGB 8.1* 7.7* 7.1* 8.4* 8.8*  HCT 24.3* 24.6* 23.1* 26.6* 27.8*  MCV 85.9 89.1 90.9 89.6 90.8  PLT 410* 375 369 364 0000000   Basic Metabolic Panel: Recent Labs  Lab 08/04/20 0052 08/06/20 0237 08/07/20 0144 08/08/20 0223 08/09/20 0142 08/10/20 0122  NA 131* 135 138 139 140 142  K 4.2 4.7 4.6 5.0 5.5* 5.5*  CL 101 104 108 110 112* 112*  CO2 22 22 23 23 22 22   GLUCOSE 206* 232* 191* 148* 198* 193*  BUN 29* 36* 35* 32* 28* 24*  CREATININE 1.84* 2.42* 2.20* 1.85* 1.48* 1.33*  CALCIUM 7.3* 7.4* 7.3* 7.3* 7.2* 7.7*  MG 2.4  --   --  2.4 2.5* 2.1  PHOS 3.6  --   --  3.2 3.5 3.2   GFR: Estimated Creatinine Clearance: 76.3 mL/min (A) (by C-G formula based on SCr of 1.33 mg/dL (H)). Liver Function Tests: Recent Labs  Lab 08/06/20 0237 08/07/20 0144 08/08/20 0223 08/09/20 0142 08/10/20 0122   AST 13* 14* 15 23 20   ALT 13 11 15 18 19   ALKPHOS 75 67 66 81 87  BILITOT 0.2* 0.4 0.4 0.2* 0.4  PROT 5.5* 5.3* 5.5* 6.0* 6.0*  ALBUMIN 1.4* 1.3* 1.4* 1.5* 1.6*   No results for input(s): LIPASE, AMYLASE in the last 168 hours. No results for input(s): AMMONIA in the last 168 hours. Coagulation Profile: No results for input(s): INR, PROTIME in the last 168 hours. Cardiac Enzymes: No results for input(s): CKTOTAL, CKMB, CKMBINDEX, TROPONINI in the last 168 hours. BNP (last 3 results) No results for input(s): PROBNP in the last 8760 hours. HbA1C: No results for input(s): HGBA1C in the last 72 hours. CBG: Recent Labs  Lab 08/09/20 1144 08/09/20 1612 08/09/20 2128 08/10/20 0813 08/10/20 1203  GLUCAP 207* 194* 155* 134* 110*   Lipid Profile: No results for input(s): CHOL, HDL, LDLCALC, TRIG, CHOLHDL, LDLDIRECT in the last 72 hours. Thyroid Function Tests: Recent Labs    08/10/20 0122 08/10/20 0955  TSH 4.775*  --   FREET4  --  0.91   Anemia Panel: No results for input(s): VITAMINB12,  FOLATE, FERRITIN, TIBC, IRON, RETICCTPCT in the last 72 hours. Sepsis Labs: No results for input(s): PROCALCITON, LATICACIDVEN in the last 168 hours.  Recent Results (from the past 240 hour(s))  Resp Panel by RT-PCR (Flu A&B, Covid) Nasopharyngeal Swab     Status: None   Collection Time: 08/01/20 10:15 PM   Specimen: Nasopharyngeal Swab; Nasopharyngeal(NP) swabs in vial transport medium  Result Value Ref Range Status   SARS Coronavirus 2 by RT PCR NEGATIVE NEGATIVE Final    Comment: (NOTE) SARS-CoV-2 target nucleic acids are NOT DETECTED.  The SARS-CoV-2 RNA is generally detectable in upper respiratory specimens during the acute phase of infection. The lowest concentration of SARS-CoV-2 viral copies this assay can detect is 138 copies/mL. A negative result does not preclude SARS-Cov-2 infection and should not be used as the sole basis for treatment or other patient management  decisions. A negative result may occur with  improper specimen collection/handling, submission of specimen other than nasopharyngeal swab, presence of viral mutation(s) within the areas targeted by this assay, and inadequate number of viral copies(<138 copies/mL). A negative result must be combined with clinical observations, patient history, and epidemiological information. The expected result is Negative.  Fact Sheet for Patients:  EntrepreneurPulse.com.au  Fact Sheet for Healthcare Providers:  IncredibleEmployment.be  This test is no t yet approved or cleared by the [place] FDA and  has been authorized for detection and/or diagnosis of SARS-CoV-2 by FDA under an Emergency Use Authorization (EUA). This EUA will remain  in effect (meaning this test can be used) for the duration of the COVID-19 declaration under Section 564(b)(1) of the Act, 21 U.S.C.section 360bbb-3(b)(1), unless the authorization is terminated  or revoked sooner.       Influenza A by PCR NEGATIVE NEGATIVE Final   Influenza B by PCR NEGATIVE NEGATIVE Final    Comment: (NOTE) The Xpert Xpress SARS-CoV-2/FLU/RSV plus assay is intended as an aid in the diagnosis of influenza from Nasopharyngeal swab specimens and should not be used as a sole basis for treatment. Nasal washings and aspirates are unacceptable for Xpert Xpress SARS-CoV-2/FLU/RSV testing.  Fact Sheet for Patients: EntrepreneurPulse.com.au  Fact Sheet for Healthcare Providers: IncredibleEmployment.be  This test is not yet approved or cleared by the [place] FDA and has been authorized for detection and/or diagnosis of SARS-CoV-2 by FDA under an Emergency Use Authorization (EUA). This EUA will remain in effect (meaning this test can be used) for the duration of the COVID-19 declaration under Section 564(b)(1) of the Act, 21 U.S.C. section 360bbb-3(b)(1), unless the  authorization is terminated or revoked.  Performed at [facility], [place] [address] [place], [place] [address]   Blood Culture (routine x 2)     Status: None   Collection Time: 08/01/20 10:40 PM   Specimen: BLOOD  Result Value Ref Range Status   Specimen Description BLOOD SITE NOT SPECIFIED  Final   Special Requests   Final    BOTTLES DRAWN AEROBIC AND ANAEROBIC Blood Culture adequate volume   Culture   Final    NO GROWTH 5 DAYS Performed at [facility], [address] [place], [place] [address]    Report Status 08/06/2020 FINAL  Final  Blood Culture (routine x 2)     Status: None   Collection Time: 08/01/20 10:40 PM   Specimen: BLOOD  Result Value Ref Range Status   Specimen Description BLOOD SITE NOT SPECIFIED  Final   Special Requests   Final    BOTTLES  DRAWN AEROBIC AND ANAEROBIC Blood Culture adequate volume   Culture   Final    NO GROWTH 5 DAYS Performed at [facility], [place] [address], [place], [place] [address]    Report Status 08/06/2020 FINAL  Final  Aerobic/Anaerobic Culture (surgical/deep wound)     Status: None   Collection Time: 08/02/20  1:37 AM   Specimen: Abscess  Result Value Ref Range Status   Specimen Description ABSCESS  Final   Special Requests PENILE  Final   Gram Stain   Final    ABUNDANT WBC PRESENT, PREDOMINANTLY PMN ABUNDANT GRAM POSITIVE COCCI RARE GRAM POSITIVE RODS    Culture   Final    FEW PROTEUS MIRABILIS RARE GROUP B STREP(S.AGALACTIAE)ISOLATED TESTING AGAINST S. AGALACTIAE NOT ROUTINELY PERFORMED DUE TO PREDICTABILITY OF AMP/PEN/VAN SUSCEPTIBILITY. FEW PREVOTELLA SPECIES BETA LACTAMASE POSITIVE Performed at [facility], [place] [address], [place], [place] [address]    Report Status 08/07/2020 FINAL  Final   Organism ID, Bacteria PROTEUS MIRABILIS  Final      Susceptibility   Proteus mirabilis - MIC*    AMPICILLIN >=32 RESISTANT Resistant     CEFAZOLIN 8 SENSITIVE Sensitive     CEFEPIME <=0.12 SENSITIVE  Sensitive     CEFTAZIDIME <=1 SENSITIVE Sensitive     CEFTRIAXONE <=0.25 SENSITIVE Sensitive     CIPROFLOXACIN <=0.25 SENSITIVE Sensitive     GENTAMICIN <=1 SENSITIVE Sensitive     IMIPENEM 2 SENSITIVE Sensitive     TRIMETH/SULFA <=20 SENSITIVE Sensitive     AMPICILLIN/SULBACTAM >=32 RESISTANT Resistant     PIP/TAZO <=4 SENSITIVE Sensitive     * FEW PROTEUS MIRABILIS  MRSA PCR Screening     Status: None   Collection Time: 08/02/20  9:43 AM   Specimen: Nasal Mucosa; Nasopharyngeal  Result Value Ref Range Status   MRSA by PCR NEGATIVE NEGATIVE Final    Comment:        The GeneXpert MRSA Assay (FDA approved for NASAL specimens only), is one component of a comprehensive MRSA colonization surveillance program. It is not intended to diagnose MRSA infection nor to guide or monitor treatment for MRSA infections. Performed at [facility], [place] [address], [place], [place] [address]   Surgical pcr screen     Status: None   Collection Time: 08/05/20  5:08 AM   Specimen: Nasal Mucosa; Nasal Swab  Result Value Ref Range Status   MRSA, PCR NEGATIVE NEGATIVE Final   Staphylococcus aureus NEGATIVE NEGATIVE Final    Comment: (NOTE) The Xpert SA Assay (FDA approved for NASAL specimens in patients 12 years of age and older), is one component of a comprehensive surveillance program. It is not intended to diagnose infection nor to guide or monitor treatment. Performed at [facility], [place] [address] [place], [place] [address]      RN Pressure Injury Documentation: Pressure Injury 03/24/17 Stage II -  Partial thickness loss of dermis presenting as a shallow open ulcer with a red, pink wound bed without slough. (Active)  03/24/17 1131  Location: Elbow  Location Orientation: Right  Staging: Stage II -  Partial thickness loss of dermis presenting as a shallow open ulcer with a red, pink wound bed without slough.  Wound Description (Comments):   Present on Admission: Yes      Pressure Injury 08/05/20 Hip Anterior;Left Stage 4 - Full thickness tissue loss with exposed bone, tendon or muscle. (Active)  08/05/20 2000  Location: Hip  Location Orientation: Anterior;Left  Staging: Stage 4 - Full thickness tissue loss with exposed bone, tendon or muscle.  Wound Description (Comments):   Present on Admission: Yes     Pressure Injury 08/05/20 Heel Left Deep Tissue Pressure Injury - Purple or maroon localized area of discolored intact skin or blood-filled blister due to damage of underlying soft tissue from pressure and/or shear. (Active)  08/05/20 2000  Location: Heel  Location Orientation: Left  Staging: Deep Tissue Pressure Injury - Purple or maroon localized area of discolored intact skin or blood-filled blister due to damage of underlying soft tissue from pressure and/or shear.  Wound Description (Comments):   Present on Admission: Yes     Pressure Injury 08/07/20 Sacrum Mid;Lower Stage 2 -  Partial thickness loss of dermis presenting as a shallow open injury with a red, pink wound bed without slough. (Active)  08/07/20 0800  Location: Sacrum  Location Orientation: Mid;Lower  Staging: Stage 2 -  Partial thickness loss of dermis presenting as a shallow open injury with a red, pink wound bed without slough.  Wound Description (Comments):   Present on Admission:      Estimated body mass index is 31.48 kg/m as calculated from the following:   Height as of this encounter: 5\' 8"  (1.727 m).   Weight as of this encounter: 93.9 kg.  Malnutrition Type:  Nutrition Problem: Increased nutrient needs Etiology: wound healing   Malnutrition Characteristics:  Signs/Symptoms: estimated needs   Nutrition Interventions:  Interventions: Ensure Enlive (each supplement provides 350kcal and 20 grams of protein),MVI   Radiology Studies: No results found. Scheduled Meds: . sodium chloride   Intravenous Once  . atorvastatin  40 mg Oral Daily  . Chlorhexidine Gluconate  Cloth  6 each Topical Daily  . dolutegravir  50 mg Oral Daily  . emtricitabine-tenofovir AF  1 tablet Oral QHS  . feeding supplement (GLUCERNA SHAKE)  237 mL Oral TID BM  . insulin aspart  0-5 Units Subcutaneous QHS  . insulin aspart  0-9 Units Subcutaneous TID WC  . insulin glargine  15 Units Subcutaneous Daily  . metoprolol tartrate  100 mg Oral BID  . metroNIDAZOLE  500 mg Oral Q8H  . multivitamin with minerals  1 tablet Oral Daily  . nutrition supplement (JUVEN)  1 packet Oral BID BM  . sertraline  100 mg Oral BID  . sodium chloride flush  5 mL Intracatheter Q8H  . sodium zirconium cyclosilicate  10 g Oral Once   Continuous Infusions: . cefTRIAXone (ROCEPHIN)  IV 2 g (08/10/20 1012)    LOS: 8 days   08/12/20, DO [facility] PAGER is on AMION  If 7PM-7AM, please contact night-coverage [email or web address]

## 2020-08-10 NOTE — Progress Notes (Signed)
5 Days Post-Op   Subjective/Chief Complaint:  1 - Peno-Scrotal Necrotizing Fascitis, Urethral Disruption - s/p operative debridement Fournier's 12/22, 12/24, 12/26. SP Tube placed 12/27. Bulbar urethral obliterated and catheter across. Wound CX Proteus sens cephs, cipro, gent, bactrim. RES amp. On IV ceftriaxone. H/o HIV, DM, PAD.   2- Disposition - awaiting transfer to [facility] for reconstruction  Today "Francisco Hutchinson" is stable. Pain controlled. Doing well with PO nutrition.    Objective: Vital signs in last 24 hours: Temp:  [97.5 F (36.4 C)-98.4 F (36.9 C)] 98.2 F (36.8 C) (12/31 0425) Pulse Rate:  [64-140] 84 (12/31 0425) Resp:  [18-20] 18 (12/31 0425) BP: (122-141)/(93-108) 138/108 (12/31 0620) SpO2:  [98 %-100 %] 98 % (12/31 0425) Weight:  [93.9 kg] 93.9 kg (12/31 0425) Last BM Date: 08/09/20  Intake/Output from previous day: 12/30 0701 - 12/31 0700 In: -  Out: 2075 [Urine:400; Drains:1675] Intake/Output this shift: No intake/output data recorded.  General appearance: alert, cooperative and appears older than stated age Eyes: wearing glasses Nose: Nares normal. Septum midline. Mucosa normal. No drainage or sinus tenderness. Throat: lips, mucosa, and tongue normal; teeth and gums normal Back: symmetric, no curvature. ROM normal. No CVA tenderness. Resp: non-labored on room air.  GI: soft, non-tender; bowel sounds normal; no masses,  no organomegaly Male genitalia: granulation tissu covering rea of deep prepubic and dorsal penile wound. NO purluluence. Wet to dry dressing replaced. Penile catheter in expected position across known urethral injury. SPT in palce wtih yellow urien that is non-foul.  Extremities: some wasting of UE bilateralliy.   Lab Results:  Recent Labs    08/09/20 0142 08/10/20 0122  WBC 12.6* 14.4*  HGB 8.4* 8.8*  HCT 26.6* 27.8*  PLT 364 318   BMET Recent Labs    08/09/20 0142 08/10/20 0122  NA 140 142  K 5.5* 5.5*  CL 112* 112*  CO2 22 22   GLUCOSE 198* 193*  BUN 28* 24*  CREATININE 1.48* 1.33*  CALCIUM 7.2* 7.7*   PT/INR No results for input(s): LABPROT, INR in the last 72 hours. ABG No results for input(s): PHART, HCO3 in the last 72 hours.  Invalid input(s): PCO2, PO2  Studies/Results: No results found.  Anti-infectives: Anti-infectives (From admission, onward)   Start     Dose/Rate Route Frequency Ordered Stop   08/09/20 1400  metroNIDAZOLE (FLAGYL) tablet 500 mg        500 mg Oral Every 8 hours 08/09/20 0956     08/06/20 1530  cefTRIAXone (ROCEPHIN) 2 g in sodium chloride 0.9 % 100 mL IVPB        2 g 200 mL/hr over 30 Minutes Intravenous Every 24 hours 08/06/20 1432     08/04/20 2200  emtricitabine-tenofovir AF (DESCOVY) 200-25 MG per tablet 1 tablet        1 tablet Oral Daily at bedtime 08/04/20 0926     08/04/20 1015  dolutegravir (TIVICAY) tablet 50 mg        50 mg Oral Daily 08/04/20 0926     08/03/20 1800  piperacillin-tazobactam (ZOSYN) IVPB 3.375 g  Status:  Discontinued        3.375 g 12.5 mL/hr over 240 Minutes Intravenous Every 8 hours 08/03/20 1719 08/06/20 1432   08/02/20 1000  vancomycin (VANCOCIN) IVPB 1000 mg/200 mL premix  Status:  Discontinued        1,000 mg 200 mL/hr over 60 Minutes Intravenous Every 12 hours 08/02/20 0354 08/04/20 0924   08/02/20 0600  ceFEPIme (MAXIPIME)  2 g in sodium chloride 0.9 % 100 mL IVPB  Status:  Discontinued        2 g 200 mL/hr over 30 Minutes Intravenous Every 8 hours 08/01/20 2148 08/02/20 0345   08/02/20 0600  piperacillin-tazobactam (ZOSYN) IVPB 3.375 g  Status:  Discontinued        3.375 g 12.5 mL/hr over 240 Minutes Intravenous Every 8 hours 08/02/20 0328 08/03/20 1719   08/01/20 2230  vancomycin (VANCOCIN) IVPB 1000 mg/200 mL premix        1,000 mg 200 mL/hr over 60 Minutes Intravenous  Once 08/01/20 2225 08/02/20 0028   08/01/20 2230  clindamycin (CLEOCIN) IVPB 600 mg        600 mg 100 mL/hr over 30 Minutes Intravenous  Once 08/01/20 2225 08/01/20  2320   08/01/20 2045  ceFEPIme (MAXIPIME) 2 g in sodium chloride 0.9 % 100 mL IVPB        2 g 200 mL/hr over 30 Minutes Intravenous  Once 08/01/20 2041 08/01/20 2249      Assessment/Plan:  1 - Peno-Scrotal Necrotizing Fascitis, Urethral Disruption - agree with current ceftriaxone. No indications for further debridement at this time. He will need complex reconstruction and is awaiting [facility] transfter for these services. His underlyign immmune compromise, vascular and metabolic disease do not favor good wound healing.   2- Disposition - [facility] transfer pending  Francisco Hutchinson 08/10/2020

## 2020-08-11 DIAGNOSIS — N4821 Abscess of corpus cavernosum and penis: Secondary | ICD-10-CM | POA: Diagnosis not present

## 2020-08-11 DIAGNOSIS — R000 Tachycardia, unspecified: Secondary | ICD-10-CM | POA: Diagnosis not present

## 2020-08-11 DIAGNOSIS — I480 Paroxysmal atrial fibrillation: Secondary | ICD-10-CM | POA: Diagnosis not present

## 2020-08-11 DIAGNOSIS — N493 Fournier gangrene: Secondary | ICD-10-CM | POA: Diagnosis not present

## 2020-08-11 DIAGNOSIS — I4891 Unspecified atrial fibrillation: Secondary | ICD-10-CM | POA: Diagnosis not present

## 2020-08-11 LAB — CBC WITH DIFFERENTIAL/PLATELET
Abs Immature Granulocytes: 0.1 10*3/uL — ABNORMAL HIGH (ref 0.00–0.07)
Basophils Absolute: 0.1 10*3/uL (ref 0.0–0.1)
Basophils Relative: 1 %
Eosinophils Absolute: 0.1 10*3/uL (ref 0.0–0.5)
Eosinophils Relative: 1 %
HCT: 30.1 % — ABNORMAL LOW (ref 39.0–52.0)
Hemoglobin: 9.1 g/dL — ABNORMAL LOW (ref 13.0–17.0)
Immature Granulocytes: 1 %
Lymphocytes Relative: 14 %
Lymphs Abs: 1.4 10*3/uL (ref 0.7–4.0)
MCH: 28.1 pg (ref 26.0–34.0)
MCHC: 30.2 g/dL (ref 30.0–36.0)
MCV: 92.9 fL (ref 80.0–100.0)
Monocytes Absolute: 0.3 10*3/uL (ref 0.1–1.0)
Monocytes Relative: 3 %
Neutro Abs: 8.3 10*3/uL — ABNORMAL HIGH (ref 1.7–7.7)
Neutrophils Relative %: 80 %
Platelets: 357 10*3/uL (ref 150–400)
RBC: 3.24 MIL/uL — ABNORMAL LOW (ref 4.22–5.81)
RDW: 18.1 % — ABNORMAL HIGH (ref 11.5–15.5)
WBC: 10.3 10*3/uL (ref 4.0–10.5)
nRBC: 0 % (ref 0.0–0.2)

## 2020-08-11 LAB — COMPREHENSIVE METABOLIC PANEL
ALT: 17 U/L (ref 0–44)
AST: 19 U/L (ref 15–41)
Albumin: 1.6 g/dL — ABNORMAL LOW (ref 3.5–5.0)
Alkaline Phosphatase: 92 U/L (ref 38–126)
Anion gap: 8 (ref 5–15)
BUN: 24 mg/dL — ABNORMAL HIGH (ref 6–20)
CO2: 21 mmol/L — ABNORMAL LOW (ref 22–32)
Calcium: 7.9 mg/dL — ABNORMAL LOW (ref 8.9–10.3)
Chloride: 112 mmol/L — ABNORMAL HIGH (ref 98–111)
Creatinine, Ser: 1.19 mg/dL (ref 0.61–1.24)
GFR, Estimated: >60 mL/min (ref >60)
Glucose, Bld: 163 mg/dL — ABNORMAL HIGH (ref 70–99)
Potassium: 5.6 mmol/L — ABNORMAL HIGH (ref 3.5–5.1)
Sodium: 141 mmol/L (ref 135–145)
Total Bilirubin: 0.3 mg/dL (ref 0.3–1.2)
Total Protein: 6 g/dL — ABNORMAL LOW (ref 6.5–8.1)

## 2020-08-11 LAB — GLUCOSE, CAPILLARY
Glucose-Capillary: 126 mg/dL — ABNORMAL HIGH (ref 70–99)
Glucose-Capillary: 197 mg/dL — ABNORMAL HIGH (ref 70–99)
Glucose-Capillary: 173 mg/dL — ABNORMAL HIGH (ref 70–99)
Glucose-Capillary: 104 mg/dL — ABNORMAL HIGH (ref 70–99)

## 2020-08-11 LAB — MAGNESIUM: Magnesium: 2.2 mg/dL (ref 1.7–2.4)

## 2020-08-11 LAB — PHOSPHORUS: Phosphorus: 3.4 mg/dL (ref 2.5–4.6)

## 2020-08-11 MED ORDER — SODIUM POLYSTYRENE SULFONATE 15 GM/60ML PO SUSP
30.0000 g | Freq: Once | ORAL | Status: AC
  Administered 2020-08-11: 30 g via ORAL
  Filled 2020-08-11: qty 120

## 2020-08-11 MED ORDER — SODIUM ZIRCONIUM CYCLOSILICATE 10 G PO PACK
10.0000 g | PACK | Freq: Once | ORAL | Status: DC
  Filled 2020-08-11: qty 1

## 2020-08-11 MED ORDER — DIGOXIN 0.25 MG/ML IJ SOLN
0.2500 mg | Freq: Once | INTRAMUSCULAR | Status: AC
  Administered 2020-08-11: 0.25 mg via INTRAVENOUS
  Filled 2020-08-11: qty 2

## 2020-08-11 MED ORDER — HEPARIN (PORCINE) 25000 UT/250ML-% IV SOLN
1900.0000 [IU]/h | INTRAVENOUS | Status: DC
  Administered 2020-08-11: 1300 [IU]/h via INTRAVENOUS
  Administered 2020-08-13 – 2020-08-15 (×6): 1900 [IU]/h via INTRAVENOUS
  Filled 2020-08-11 (×8): qty 250

## 2020-08-11 MED ORDER — DIGOXIN 125 MCG PO TABS
0.1250 mg | ORAL_TABLET | Freq: Every day | ORAL | Status: DC
  Administered 2020-08-12 – 2020-08-13 (×2): 0.125 mg via ORAL
  Filled 2020-08-11 (×2): qty 1

## 2020-08-11 NOTE — Progress Notes (Signed)
PROGRESS NOTE    Francisco Hutchinson  [id number] DOB: 08-26-1970 DOA: 08/01/2020 PCP: [name], MD   Brief Narrative: The patient is a 50 year old nursing home resident with a past medical history significant for but not limited to hypertension, HIV, CVA, diabetes mellitus type 2, anxiety and depression, as well as other comorbidities who presented to the emergency room with septic shock due to Fournier's gangrene neurology is consulted and patient was taken urgently to the OR for incision and drainage.  He was then taken back to the OR on 08/05/2020 for a washout and debridement of the penile and perineal abscess at the patient has had necrotizing fasciitis of the penis and perineum.  He was transferred to [facility] service 08/05/2020.  Wound culture wound 40 is in blood cultures have been negative.  MRSA PCR was negative and Covid was negative as well as flu.  He was started on IV Zosyn and changed to IV Ceftriaxone.  He underwent a suprapubic catheter placement on 08/06/2020 by interventional radiology  On 08/08/2020 his blood count had dropped to 7.1 so he be typed and screened and transfused 1 unit of PRBCs as his blood count has been slowly trending down.  Repeat blood count 08/09/2020 is now 8.4/26.6 and stable.  Renal function is improving but he was little hyperkalemic.  He is given bolus of Lokelma.  He was found to be in new onset atrial fibrillation with RVR and was given metoprolol push.  Cardiology yesterday given his new A. fib with RVR  Cardiology evaluated for his proximal atrial fibrillation with RVR and because this occurred in the setting of Fournier's gangrene they checked a TSH for completeness and recommended continue metoprolol 100 mg p.o. twice daily for rate control and also added Cardizem 30 mg every 6 hours and recommended possibly changing to Cardizem CD 120 mg today if heart rate and blood pressure remained stable.  His CHA2DS2-VASc score was 5 and so they recommended  starting anticoagulation but only when it is okay with urology I spoke with Dr. Milford Cage of urology who okayed the heparin drip for now and when his urological procedures are improved and they are recommending Xarelto p.o.  Currently have started the transfer process to [facility] for reconstructive surgery to the care of Dr. Francesca Jewett per urology's recommendations but they are requesting urology to urology discussion after Monday and I spoke with Dr. Milford Cage and Dr. McDiarmid will call Dr. Francesca Jewett when he is back next week.  Assessment & Plan:   Active Problems:   Fourniers gangrene   Penile abscess   Pressure injury of skin   Hypotension   Tachycardia   Fournier's Gangrene and necrotizing fasciitis of the penis and perineum  -Presented with septic shock and is admitted to [facility] and and transferred to [facility] service 08/05/2020 -He is status post debridement twice by Urology.  Culture growing Proteus so will continue IV Zosyn but chjanged to IV Ceftriaxone and also added Flagyl; need to continue monitor carefully and if necessary will transition back to IV Zosyn -Patient scheduled to have suprapubic catheter placed by interventional radiology and this was done 08/06/20 -Patient afebrile, WBC was trending down from Admission (27.5) and has now stabilized and has now gone from 14.4 -> 14.5 -> 13.8 -> 12.6 slightly worse at 14.4 -Urology recommending changing the dressings wet-to-dry daily as his granulation tissue looks fairly well.  Urology recommending transferred to [facility] later this week to the care of Dr. Francesca Jewett for reconstruction and process has  been initiated and per my discussion with the [facility] transfer coordinator as he is at capacity and recommending checking back next week -We will need to discuss with neurology about starting a heparin drip if he remains hospitalized here off of anticoagulation given his history of CVA and CHA2DS2-VASc score of 5  **ADDENDUM: I spoke with urology at [facility] Dr. Stanford Breed on  08/10/20 who is the fellow there and he feels that they do not have any intermediate beds currently still and they cannot accept the patient at this time.  He recommended calling back after Sunday when his partner Dr. Francesca Jewett is back from vacation and recommended that the urology team call to figure out what exactly the patient will require in terms of reconstruction.  We will reach out to our urology team to have them call after Sunday for transfer for this patient as it likely needs to be a urology to urology consult  HIV  -C/w Dolutegravir 50 mg po daily and Emticitabine-Tenofovir 200-25 mg po daily  Hx of CVA -Was on ASA pta and has ben held for now and will resume per cardiology recommendations  -C/w Atorvastatin 40 mg po Daily   Uncontrolled Diabetes Mellitus Type 2 -C/w Lantus 15 units sq daily; patient takes dulaglutide injection 1.5 mg in the skin once a week, Jardiance 10 mg p.o. daily, and Lantus 55 units in the skin subcu at bedtime -C/w Sensitive Novolog SSI AC -CBG's ranging from 126-197 -HbA1c is 8.8 -Continue to Monitor Blood Sugars carefully and adjust insulin as Necessary  Anxiety and Depression -C/w Sertraline 100 mg po BID  Hyponatremia -Improved. Na+ is now 141 -Continue to Monitor and Trend -IV fluid hydration is now stopped -Repeat CMP in the AM   HTN -C/w Metoprolol 100 mg po BID -Currently his hydrochlorothiazide 25 mg p.o. daily currently being held given that he is getting IV fluid hydration -His amlodipine 5 mg p.o. daily also held given his lower blood pressures as his last blood pressure was 118/91 -continue monitor blood pressures per protocol  HLD -C/w Atorvastatin 40 mg p.o. daily; patient no longer taking fenofibrate   AKI on CKD Stage 3a -Patient's BUN/Cr is remarkably improved and is now 24/1.19 -IV fluid hydration is now stopped -Avoid Nephrotoxic Medications, Contrast Dyes, Hypotension and Renally adjust Medications -Continue to Monitor  and Trend Renal Fxn and Repeat CMP in the AM   Hyperkalemia -Patient had a mildly elevated potassium at 5.6 today -Was trending up for last 3 days -Patient has been refusing his Lokelma so we will reorder another Kayexalate dosing -Continue monitor trend and repeat CMP in a.m.  Normocytic Anemia/Anemia of Chronic Disease -Patient's Hgb/Hct went from 8.7/28.1 -> 7.0/20.8 -> 6.7/20.1 -> 8.1/24.3 -> 7.7/24.6 and hemoglobin dropped to 7.1/23.1 so we will type and screen and transfuse 1 unit PRBCs; now Hgb/Hct is now 8.4/26.6 yesterday and today it is 9.1/30.1 -Patient is status post 2 units of PRBCs -Continue to Monitor for S/Sx of Bleeding now that we will start him on heparin drip per urology's clearance; Currently no overt bleeding noted -Repeat CBC in the AM  New Onset A. fib with RVR, improved initially but back in RVR this morning -Patient had elevated heart rates once again this morning -EKG done in confirmed A. Fib -Echo done recently showed an EF of 40 to 45% -Avoiding Cardizem given his low EF cardiology wants to trial p.o. Cardizem and started him on 30 every 6 scheduled; cardiology starting the patient on digoxin 0.125 mg  p.o. daily and recommended anticoagulation so I spoke with urology and will start the patient on a heparin drip as they have given the okay and clearance for it;  -Continue with metoprolol tartrate 100 g p.o. twice daily and given 5 mg of IV metoprolol push -Cardiology was consulted yesterday and they are recommending trialing Cardizem p.o. in addition to metoprolol; now Cardizem has been discontinued and he is on digoxin 0.125 mg p.o. daily as well as metoprolol tartrate 100 mg p.o. twice daily -Patient's CHA2DS2-VASc score is at least 5 for CHF, hypertension, diabetes, and CVA -Anticoagulation is to begin with a heparin drip -Heart rate is improved and is in the low 100s -Troponin was obtained and first was 13 and next one was 17 -Continue monitor on  telemetry -He will need anticoagulation given elevated CHA2DS2-VASc score but cardiology recommending holding off p.o. anticoagulation until his urological procedures are done and recommending Xarelto p.o.  In the interim we will start the patient on heparin drip as Dr. Benancio Deeds has okayed anticoagulation at this time  Pressure Injuries, poA Pressure Injury 03/24/17 Stage II -  Partial thickness loss of dermis presenting as a shallow open ulcer with a red, pink wound bed without slough. (Active)  03/24/17 1131  Location: Elbow  Location Orientation: Right  Staging: Stage II -  Partial thickness loss of dermis presenting as a shallow open ulcer with a red, pink wound bed without slough.  Wound Description (Comments):   Present on Admission: Yes     Pressure Injury 08/05/20 Hip Anterior;Left Stage 4 - Full thickness tissue loss with exposed bone, tendon or muscle. (Active)  08/05/20 2000  Location: Hip  Location Orientation: Anterior;Left  Staging: Stage 4 - Full thickness tissue loss with exposed bone, tendon or muscle.  Wound Description (Comments):   Present on Admission: Yes     Pressure Injury 08/05/20 Heel Left Deep Tissue Pressure Injury - Purple or maroon localized area of discolored intact skin or blood-filled blister due to damage of underlying soft tissue from pressure and/or shear. (Active)  08/05/20 2000  Location: Heel  Location Orientation: Left  Staging: Deep Tissue Pressure Injury - Purple or maroon localized area of discolored intact skin or blood-filled blister due to damage of underlying soft tissue from pressure and/or shear.  Wound Description (Comments):   Present on Admission: Yes     Pressure Injury 08/07/20 Sacrum Mid;Lower Stage 2 -  Partial thickness loss of dermis presenting as a shallow open injury with a red, pink wound bed without slough. (Active)  08/07/20 0800  Location: Sacrum  Location Orientation: Mid;Lower  Staging: Stage 2 -  Partial thickness loss  of dermis presenting as a shallow open injury with a red, pink wound bed without slough.  Wound Description (Comments):   Present on Admission:    Obesity -Complicates overall prognosis and care -Estimated body mass index is 31.61 kg/m as calculated from the following:   Height as of this encounter: 5\' 8"  (1.727 m).   Weight as of this encounter: 94.3 kg. -Weight Loss and Dietary Counseling given  DVT prophylaxis: SCDs; now we will start the patient on a heparin drip Code Status: FULL CODE  Family Communication: No family present at bedside  Disposition Plan: Likely Transfer [facility] later this week per Urology's recommendations.  Processes been initiated  Status is: Inpatient  Remains inpatient appropriate because:Unsafe d/c plan, IV treatments appropriate due to intensity of illness or inability to take PO and Inpatient level of care appropriate due  to severity of illness   Dispo: The patient is from: Home              Anticipated d/c is to: SNF              Anticipated d/c date is: > 3 days              Patient currently is not medically stable to d/c.  Consultants:   PCCM Transfer  Urology  Interventional Radiology    Procedures:  Procedure(s): 1.  Washout and debridement of penile and perineal abscess  Antimicrobials:  Anti-infectives (From admission, onward)   Start     Dose/Rate Route Frequency Ordered Stop   08/09/20 1400  metroNIDAZOLE (FLAGYL) tablet 500 mg        500 mg Oral Every 8 hours 08/09/20 0956     08/06/20 1530  cefTRIAXone (ROCEPHIN) 2 g in sodium chloride 0.9 % 100 mL IVPB        2 g 200 mL/hr over 30 Minutes Intravenous Every 24 hours 08/06/20 1432     08/04/20 2200  emtricitabine-tenofovir AF (DESCOVY) 200-25 MG per tablet 1 tablet        1 tablet Oral Daily at bedtime 08/04/20 0926     08/04/20 1015  dolutegravir (TIVICAY) tablet 50 mg        50 mg Oral Daily 08/04/20 0926     08/03/20 1800  piperacillin-tazobactam (ZOSYN) IVPB 3.375 g  Status:   Discontinued        3.375 g 12.5 mL/hr over 240 Minutes Intravenous Every 8 hours 08/03/20 1719 08/06/20 1432   08/02/20 1000  vancomycin (VANCOCIN) IVPB 1000 mg/200 mL premix  Status:  Discontinued        1,000 mg 200 mL/hr over 60 Minutes Intravenous Every 12 hours 08/02/20 0354 08/04/20 0924   08/02/20 0600  ceFEPIme (MAXIPIME) 2 g in sodium chloride 0.9 % 100 mL IVPB  Status:  Discontinued        2 g 200 mL/hr over 30 Minutes Intravenous Every 8 hours 08/01/20 2148 08/02/20 0345   08/02/20 0600  piperacillin-tazobactam (ZOSYN) IVPB 3.375 g  Status:  Discontinued        3.375 g 12.5 mL/hr over 240 Minutes Intravenous Every 8 hours 08/02/20 0328 08/03/20 1719   08/01/20 2230  vancomycin (VANCOCIN) IVPB 1000 mg/200 mL premix        1,000 mg 200 mL/hr over 60 Minutes Intravenous  Once 08/01/20 2225 08/02/20 0028   08/01/20 2230  clindamycin (CLEOCIN) IVPB 600 mg        600 mg 100 mL/hr over 30 Minutes Intravenous  Once 08/01/20 2225 08/01/20 2320   08/01/20 2045  ceFEPIme (MAXIPIME) 2 g in sodium chloride 0.9 % 100 mL IVPB        2 g 200 mL/hr over 30 Minutes Intravenous  Once 08/01/20 2041 08/01/20 2249       Subjective: Seen and examined at bedside and his heart rate was back up into the teens to 120s.  He denies any chest pain, lightheadedness or dizziness.  States he slept okay.  No nausea or vomiting.  Discussed with him about treating his potassium and is initially agreeable but then refuses though, again today.  He denies any other concerns or complaints at this time  Objective: Vitals:   08/11/20 0737 08/11/20 0738 08/11/20 1119 08/11/20 1527  BP:  (!) 116/98 (!) 118/91   Pulse:  (!) 133 (!) 104 98  Resp:  18 20  Temp: 98.5 F (36.9 C)  98.3 F (36.8 C) 98.3 F (36.8 C)  TempSrc: Oral  Oral Oral  SpO2:  97%    Weight:      Height:        Intake/Output Summary (Last 24 hours) at 08/11/2020 1623 Last data filed at 08/11/2020 0413 Gross per 24 hour  Intake --  Output  650 ml  Net -650 ml   Filed Weights   08/08/20 0641 08/10/20 0425 08/11/20 0411  Weight: 94.9 kg 93.9 kg 94.3 kg   Examination: Physical Exam:  Constitutional: WN/WD chronically ill-appearing Caucasian male currently in no acute distress appears calm Eyes: Lids and conjunctivae normal, sclerae anicteric  ENMT: External Ears, Nose appear normal. Grossly normal hearing. on.  Neck: Appears normal, supple, no cervical masses, normal ROM, no appreciable thyromegaly; no JVD Respiratory: Diminished to auscultation bilaterally with coarse breath sounds, no wheezing, rales, rhonchi or crackles. Normal respiratory effort and patient is not tachypenic. No accessory muscle use.  Cardiovascular: Irregularly irregular and tachycardic, no murmurs / rubs / gallops. S1 and S2 auscultated. Minimal LE edema Abdomen: Soft, non-tender, distended secondary to body habitus. Bowel sounds positive.  GU: Deferred. Has a foley catheter in place and a PCN Musculoskeletal: No clubbing / cyanosis of digits/nails. Has a Right BKA Skin: Has a Left Hip Lesion. No induration; Warm and dry.  Neurologic: CN 2-12 grossly intact with no focal deficits. Romberg sign and cerebellar reflexes not assessed.  Psychiatric: Normal judgment and insight. Alert and oriented x 3. Normal mood and appropriate affect.   Data Reviewed: I have personally reviewed following labs and imaging studies  CBC: Recent Labs  Lab 08/07/20 0144 08/08/20 0223 08/09/20 0142 08/10/20 0122 08/11/20 0407  WBC 14.5* 13.8* 12.6* 14.4* 10.3  NEUTROABS  --  10.9* 10.0* 12.3* 8.3*  HGB 7.7* 7.1* 8.4* 8.8* 9.1*  HCT 24.6* 23.1* 26.6* 27.8* 30.1*  MCV 89.1 90.9 89.6 90.8 92.9  PLT 375 369 364 318 [id number]   Basic Metabolic Panel: Recent Labs  Lab 08/07/20 0144 08/08/20 0223 08/09/20 0142 08/10/20 0122 08/11/20 0407  NA 138 139 140 142 141  K 4.6 5.0 5.5* 5.5* 5.6*  CL 108 110 112* 112* 112*  CO2 23 23 22 22  21*  GLUCOSE 191* 148* 198* 193* 163*   BUN 35* 32* 28* 24* 24*  CREATININE 2.20* 1.85* 1.48* 1.33* 1.19  CALCIUM 7.3* 7.3* 7.2* 7.7* 7.9*  MG  --  2.4 2.5* 2.1 2.2  PHOS  --  3.2 3.5 3.2 3.4   GFR: Estimated Creatinine Clearance: 85.5 mL/min (by C-G formula based on SCr of 1.19 mg/dL). Liver Function Tests: Recent Labs  Lab 08/07/20 0144 08/08/20 0223 08/09/20 0142 08/10/20 0122 08/11/20 0407  AST 14* 15 23 20 19   ALT 11 15 18 19 17   ALKPHOS 67 66 81 87 92  BILITOT 0.4 0.4 0.2* 0.4 0.3  PROT 5.3* 5.5* 6.0* 6.0* 6.0*  ALBUMIN 1.3* 1.4* 1.5* 1.6* 1.6*   No results for input(s): LIPASE, AMYLASE in the last 168 hours. No results for input(s): AMMONIA in the last 168 hours. Coagulation Profile: No results for input(s): INR, PROTIME in the last 168 hours. Cardiac Enzymes: No results for input(s): CKTOTAL, CKMB, CKMBINDEX, TROPONINI in the last 168 hours. BNP (last 3 results) No results for input(s): PROBNP in the last 8760 hours. HbA1C: No results for input(s): HGBA1C in the last 72 hours. CBG: Recent Labs  Lab 08/10/20 1638 08/10/20 2125 08/11/20 0725  08/11/20 1204 08/11/20 1608  GLUCAP 157* 172* 126* 197* 173*   Lipid Profile: No results for input(s): CHOL, HDL, LDLCALC, TRIG, CHOLHDL, LDLDIRECT in the last 72 hours. Thyroid Function Tests: Recent Labs    08/10/20 0122 08/10/20 0955  TSH 4.775*  --   FREET4  --  0.91   Anemia Panel: No results for input(s): VITAMINB12, FOLATE, FERRITIN, TIBC, IRON, RETICCTPCT in the last 72 hours. Sepsis Labs: No results for input(s): PROCALCITON, LATICACIDVEN in the last 168 hours.  Recent Results (from the past 240 hour(s))  Resp Panel by RT-PCR (Flu A&B, Covid) Nasopharyngeal Swab     Status: None   Collection Time: 08/01/20 10:15 PM   Specimen: Nasopharyngeal Swab; Nasopharyngeal(NP) swabs in vial transport medium  Result Value Ref Range Status   SARS Coronavirus 2 by RT PCR NEGATIVE NEGATIVE Final    Comment: (NOTE) SARS-CoV-2 target nucleic acids are  NOT DETECTED.  The SARS-CoV-2 RNA is generally detectable in upper respiratory specimens during the acute phase of infection. The lowest concentration of SARS-CoV-2 viral copies this assay can detect is 138 copies/mL. A negative result does not preclude SARS-Cov-2 infection and should not be used as the sole basis for treatment or other patient management decisions. A negative result may occur with  improper specimen collection/handling, submission of specimen other than nasopharyngeal swab, presence of viral mutation(s) within the areas targeted by this assay, and inadequate number of viral copies(<138 copies/mL). A negative result must be combined with clinical observations, patient history, and epidemiological information. The expected result is Negative.  Fact Sheet for Patients:  EntrepreneurPulse.com.au  Fact Sheet for Healthcare Providers:  IncredibleEmployment.be  This test is no t yet approved or cleared by the [place] FDA and  has been authorized for detection and/or diagnosis of SARS-CoV-2 by FDA under an Emergency Use Authorization (EUA). This EUA will remain  in effect (meaning this test can be used) for the duration of the COVID-19 declaration under Section 564(b)(1) of the Act, 21 U.S.C.section 360bbb-3(b)(1), unless the authorization is terminated  or revoked sooner.       Influenza A by PCR NEGATIVE NEGATIVE Final   Influenza B by PCR NEGATIVE NEGATIVE Final    Comment: (NOTE) The Xpert Xpress SARS-CoV-2/FLU/RSV plus assay is intended as an aid in the diagnosis of influenza from Nasopharyngeal swab specimens and should not be used as a sole basis for treatment. Nasal washings and aspirates are unacceptable for Xpert Xpress SARS-CoV-2/FLU/RSV testing.  Fact Sheet for Patients: EntrepreneurPulse.com.au  Fact Sheet for Healthcare Providers: IncredibleEmployment.be  This test is not  yet approved or cleared by the [place] FDA and has been authorized for detection and/or diagnosis of SARS-CoV-2 by FDA under an Emergency Use Authorization (EUA). This EUA will remain in effect (meaning this test can be used) for the duration of the COVID-19 declaration under Section 564(b)(1) of the Act, 21 U.S.C. section 360bbb-3(b)(1), unless the authorization is terminated or revoked.  Performed at [facility], [place] [address], [place], [place] [address]   Blood Culture (routine x 2)     Status: None   Collection Time: 08/01/20 10:40 PM   Specimen: BLOOD  Result Value Ref Range Status   Specimen Description BLOOD SITE NOT SPECIFIED  Final   Special Requests   Final    BOTTLES DRAWN AEROBIC AND ANAEROBIC Blood Culture adequate volume   Culture   Final    NO GROWTH 5 DAYS Performed at [facility], [address], [place], [place] [address]    Report Status 08/06/2020 FINAL  Final  Blood Culture (routine x 2)     Status: None   Collection Time: 08/01/20 10:40 PM   Specimen: BLOOD  Result Value Ref Range Status   Specimen Description BLOOD SITE NOT SPECIFIED  Final   Special Requests   Final    BOTTLES DRAWN AEROBIC AND ANAEROBIC Blood Culture adequate volume   Culture   Final    NO GROWTH 5 DAYS Performed at [facility] [facility], [address], [place], [place] [address]    Report Status 08/06/2020 FINAL  Final  Aerobic/Anaerobic Culture (surgical/deep wound)     Status: None   Collection Time: 08/02/20  1:37 AM   Specimen: Abscess  Result Value Ref Range Status   Specimen Description ABSCESS  Final   Special Requests PENILE  Final   Gram Stain   Final    ABUNDANT WBC PRESENT, PREDOMINANTLY PMN ABUNDANT GRAM POSITIVE COCCI RARE GRAM POSITIVE RODS    Culture   Final    FEW PROTEUS MIRABILIS RARE GROUP B STREP(S.AGALACTIAE)ISOLATED TESTING AGAINST S. AGALACTIAE NOT ROUTINELY PERFORMED DUE TO PREDICTABILITY OF AMP/PEN/VAN SUSCEPTIBILITY. FEW  PREVOTELLA SPECIES BETA LACTAMASE POSITIVE Performed at [facility], [place] [address], [place], [place] [address]    Report Status 08/07/2020 FINAL  Final   Organism ID, Bacteria PROTEUS MIRABILIS  Final      Susceptibility   Proteus mirabilis - MIC*    AMPICILLIN >=32 RESISTANT Resistant     CEFAZOLIN 8 SENSITIVE Sensitive     CEFEPIME <=0.12 SENSITIVE Sensitive     CEFTAZIDIME <=1 SENSITIVE Sensitive     CEFTRIAXONE <=0.25 SENSITIVE Sensitive     CIPROFLOXACIN <=0.25 SENSITIVE Sensitive     GENTAMICIN <=1 SENSITIVE Sensitive     IMIPENEM 2 SENSITIVE Sensitive     TRIMETH/SULFA <=20 SENSITIVE Sensitive     AMPICILLIN/SULBACTAM >=32 RESISTANT Resistant     PIP/TAZO <=4 SENSITIVE Sensitive     * FEW PROTEUS MIRABILIS  MRSA PCR Screening     Status: None   Collection Time: 08/02/20  9:43 AM   Specimen: Nasal Mucosa; Nasopharyngeal  Result Value Ref Range Status   MRSA by PCR NEGATIVE NEGATIVE Final    Comment:        The GeneXpert MRSA Assay (FDA approved for NASAL specimens only), is one component of a comprehensive MRSA colonization surveillance program. It is not intended to diagnose MRSA infection nor to guide or monitor treatment for MRSA infections. Performed at [facility], [place] [address] [place], [place] [address]   Surgical pcr screen     Status: None   Collection Time: 08/05/20  5:08 AM   Specimen: Nasal Mucosa; Nasal Swab  Result Value Ref Range Status   MRSA, PCR NEGATIVE NEGATIVE Final   Staphylococcus aureus NEGATIVE NEGATIVE Final    Comment: (NOTE) The Xpert SA Assay (FDA approved for NASAL specimens in patients 52 years of age and older), is one component of a comprehensive surveillance program. It is not intended to diagnose infection nor to guide or monitor treatment. Performed at [facility], [place] [address], [place], [place] [address]      RN Pressure Injury Documentation: Pressure Injury 03/24/17 Stage II -  Partial  thickness loss of dermis presenting as a shallow open ulcer with a red, pink wound bed without slough. (Active)  03/24/17 1131  Location: Elbow  Location Orientation: Right  Staging: Stage II -  Partial thickness loss of dermis presenting as a shallow open ulcer with a red, pink wound bed without slough.  Wound Description (Comments):   Present on Admission: Yes     Pressure Injury 08/05/20 Hip Anterior;Left Stage 4 - Full thickness tissue loss with exposed bone, tendon or muscle. (Active)  08/05/20 2000  Location: Hip  Location Orientation: Anterior;Left  Staging: Stage 4 - Full thickness tissue loss with exposed bone, tendon or muscle.  Wound Description (Comments):   Present on Admission: Yes     Pressure Injury 08/05/20 Heel Left Deep Tissue Pressure Injury - Purple or maroon localized area of discolored intact skin or blood-filled blister due to damage of underlying soft tissue from pressure and/or shear. (Active)  08/05/20 2000  Location: [place]  Location Orientation: Left  Staging: Deep Tissue Pressure Injury - Purple or maroon localized area of discolored intact skin or blood-filled blister due to damage of underlying soft tissue from pressure and/or shear.  Wound Description (Comments):   Present on Admission: Yes     Pressure Injury 08/07/20 Sacrum Mid;Lower Stage 2 -  Partial thickness loss of dermis presenting as a shallow open injury with a red, pink wound bed without slough. (Active)  08/07/20 0800  Location: Sacrum  Location Orientation: Mid;Lower  Staging: Stage 2 -  Partial thickness loss of dermis presenting as a shallow open injury with a red, pink wound bed without slough.  Wound Description (Comments):   Present on Admission:      Estimated body mass index is 31.61 kg/m as calculated from the following:   Height as of this encounter: 5\' 8"  (1.727 m).   Weight as of this encounter: 94.3 kg.  Malnutrition Type:  Nutrition Problem: Increased nutrient  needs Etiology: wound healing   Malnutrition Characteristics:  Signs/Symptoms: estimated needs   Nutrition Interventions:  Interventions: Ensure Enlive (each supplement provides 350kcal and 20 grams of protein),MVI   Radiology Studies: No results found. Scheduled Meds: . sodium chloride   Intravenous Once  . atorvastatin  40 mg Oral Daily  . Chlorhexidine Gluconate Cloth  6 each Topical Daily  . [START ON 08/12/2020] digoxin  0.125 mg Oral Daily  . dolutegravir  50 mg Oral Daily  . emtricitabine-tenofovir AF  1 tablet Oral QHS  . feeding supplement (GLUCERNA SHAKE)  237 mL Oral TID BM  . insulin aspart  0-5 Units Subcutaneous QHS  . insulin aspart  0-9 Units Subcutaneous TID WC  . insulin glargine  15 Units Subcutaneous Daily  . metoprolol tartrate  100 mg Oral BID  . metroNIDAZOLE  500 mg Oral Q8H  . multivitamin with minerals  1 tablet Oral Daily  . nutrition supplement (JUVEN)  1 packet Oral BID BM  . sertraline  100 mg Oral BID  . sodium chloride flush  5 mL Intracatheter Q8H  . sodium polystyrene  30 g Oral Once  . sodium zirconium cyclosilicate  10 g Oral Once   Continuous Infusions: . cefTRIAXone (ROCEPHIN)  IV 2 g (08/11/20 0948)    LOS: 9 days   Kerney Elbe, DO [facility] PAGER is on [place]  If 7PM-7AM, please contact night-coverage [email or web address]

## 2020-08-11 NOTE — Progress Notes (Signed)
ANTICOAGULATION CONSULT NOTE - Initial Consult  Pharmacy Consult for heparin  Indication: atrial fibrillation  No Known Allergies  Patient Measurements: Height: _0  (172.7 cm) Weight: 94.3 kg (207 lb 14.3 oz) IBW/kg (Calculated) : 68.4 HEPARIN DW (KG): 92.9   Vital Signs: Temp: 98.3 F (36.8 C) (01/01 1527) Temp Source: Oral (01/01 1527) BP: 118/91 (01/01 1119) Pulse Rate: 98 (01/01 1527)  Labs: Recent Labs    08/09/20 0142 08/09/20 1105 08/09/20 1220 08/10/20 0122 08/11/20 0407  HGB 8.4*  --   --  8.8* 9.1*  HCT 26.6*  --   --  27.8* 30.1*  PLT 364  --   --  318 357  CREATININE 1.48*  --   --  1.33* 1.19  TROPONINIHS  --  13 17  --   --     Estimated Creatinine Clearance: 85.5 mL/min (by C-G formula based on SCr of 1.19 mg/dL).   Medical History: Past Medical History:  Diagnosis Date  . Allergy   . Anxiety and depression 05/29/2017  . Atrial fibrillation ([place])   . Diabetes mellitus without complication ([place])    type 2  . HIV (human immunodeficiency virus infection) ([place])   . Hyperlipidemia   . Hypertension   . Stroke [facility])     Medications:  Medications Prior to Admission  Medication Sig Dispense Refill Last Dose  . acetaminophen (TYLENOL) 325 MG tablet Take 2 tablets (650 mg total) by mouth every 6 (six) hours as needed for mild pain, fever or headache (or Fever >/= 101).   07/31/2020 at Unknown time  . amLODipine (NORVASC) 5 MG tablet TAKE 1 TABLET BY MOUTH EVERY DAY (Patient taking differently: Take 5 mg by mouth daily.) 90 tablet 3 08/01/2020 at Unknown time  . aspirin EC 81 MG tablet Take 81 mg by mouth at bedtime.   08/01/2020 at Unknown time  . atorvastatin (LIPITOR) 40 MG tablet Take 1 tablet (40 mg total) by mouth daily. 90 tablet 3 08/01/2020 at Unknown time  . cyclobenzaprine (FLEXERIL) 10 MG tablet Take 1 tablet (10 mg total) by mouth 3 (three) times daily as needed for muscle spasms. 30 tablet 0 Past Week at Unknown time  . DESCOVY 200-25 MG  tablet Take 1 tablet by mouth at bedtime. 90 tablet 3 07/31/2020 at Unknown time  . Dulaglutide (TRULICITY) 1.5 CL/2.7NT SOPN INJECT 1 PEN SUBCUTANEOUSLY ONCE A WEEK ON SUNDAY (Patient taking differently: Inject 1.5 mg into the skin once a week. Take on Sundays) 2 pen 3 07/31/2020 at Unknown time  . ferrous gluconate (FERGON) 324 MG tablet Take 1 tablet (324 mg total) by mouth daily with breakfast. 30 tablet 0 08/01/2020 at Unknown time  . hydrochlorothiazide (HYDRODIURIL) 25 MG tablet TAKE 1 TABLET BY MOUTH EVERY DAY (Patient taking differently: Take 25 mg by mouth daily.) 90 tablet 2 08/01/2020 at Unknown time  . Incontinence Supplies KIT Use as needed for incontinence 1 each 0 unknown at unknown  . JARDIANCE 10 MG TABS tablet TAKE 1 TABLET BY MOUTH EVERY DAY (Patient taking differently: Take 10 mg by mouth daily.) 90 tablet 0 08/01/2020 at Unknown time  . LANTUS SOLOSTAR 100 UNIT/ML Solostar Pen Inject 55 Units into the skin at bedtime. 45 mL 3 07/31/2020 at Unknown time  . metoprolol tartrate (LOPRESSOR) 100 MG tablet Take 1 tablet (100 mg total) by mouth 2 (two) times daily. 180 tablet 1 08/01/2020 at 0800  . sertraline (ZOLOFT) 100 MG tablet TAKE 1 TABLET BY MOUTH TWICE A  DAY (Patient taking differently: Take 100 mg by mouth daily.) 180 tablet 3 08/01/2020 at Unknown time  . TIVICAY 50 MG tablet Take 1 tablet (50 mg total) by mouth daily. 90 tablet 3 08/01/2020 at Unknown time  . acetic acid 2 % otic solution Place 6 drops into the right ear every 3 (three) hours. (Patient not taking: No sig reported) 15 mL 0 Not Taking at Unknown time  . amoxicillin (AMOXIL) 875 MG tablet Take 1 tablet (875 mg total) by mouth 2 (two) times daily. (Patient not taking: No sig reported) 14 tablet 0 Not Taking at Unknown time  . collagenase (SANTYL) ointment Apply 1 application topically daily. (Patient not taking: No sig reported) 30 g 1 Not Taking at Unknown time  . fenofibrate (TRICOR) 48 MG tablet Take 1 tablet  by mouth once daily (Patient not taking: Reported on 08/01/2020) 90 tablet 0 Not Taking at Unknown time  . fenofibrate micronized (ANTARA) 43 MG capsule TAKE 1 CAPSULE BY MOUTH ONCE DAILY BEFORE BREAKFAST (Patient not taking: No sig reported) 90 capsule 1 Not Taking at Unknown time   Scheduled:  . sodium chloride   Intravenous Once  . atorvastatin  40 mg Oral Daily  . Chlorhexidine Gluconate Cloth  6 each Topical Daily  . [START ON 08/12/2020] digoxin  0.125 mg Oral Daily  . dolutegravir  50 mg Oral Daily  . emtricitabine-tenofovir AF  1 tablet Oral QHS  . feeding supplement (GLUCERNA SHAKE)  237 mL Oral TID BM  . insulin aspart  0-5 Units Subcutaneous QHS  . insulin aspart  0-9 Units Subcutaneous TID WC  . insulin glargine  15 Units Subcutaneous Daily  . metoprolol tartrate  100 mg Oral BID  . metroNIDAZOLE  500 mg Oral Q8H  . multivitamin with minerals  1 tablet Oral Daily  . nutrition supplement (JUVEN)  1 packet Oral BID BM  . sertraline  100 mg Oral BID  . sodium chloride flush  5 mL Intracatheter Q8H  . sodium polystyrene  30 g Oral Once  . sodium zirconium cyclosilicate  10 g Oral Once    Assessment: 50 yo male with afib (CHADSVASC= 5). Pharmacy consulted to dose heparin. He is also noted with necrotizing fasciitis of the penis and perineum and plans reconstruction after transfer to [facility].  Also s/p operative debridement 12/22, 12/24 and 12/26 -hg= 9.1  Goal of Therapy:  Heparin level 0.3-0.7 units/ml Monitor platelets by anticoagulation protocol: Yes   Plan:  -No heparin bolus due to recent procedures -Start heparin at 1300 units/hr -Heparin level in 6 hours and daily wth CBC daily  Hildred Laser, PharmD Clinical Pharmacist **Pharmacist phone directory can now be found on [email or web address] ([email or web address]).  Listed under [place].

## 2020-08-11 NOTE — Progress Notes (Signed)
Progress Note  Patient Name: Francisco Hutchinson Date of Encounter: 08/11/2020  [facility] Cardiologist: New  Subjective   No CP or dyspnea  Inpatient Medications    Scheduled Meds: . sodium chloride   Intravenous Once  . atorvastatin  40 mg Oral Daily  . Chlorhexidine Gluconate Cloth  6 each Topical Daily  . dolutegravir  50 mg Oral Daily  . emtricitabine-tenofovir AF  1 tablet Oral QHS  . feeding supplement (GLUCERNA SHAKE)  237 mL Oral TID BM  . insulin aspart  0-5 Units Subcutaneous QHS  . insulin aspart  0-9 Units Subcutaneous TID WC  . insulin glargine  15 Units Subcutaneous Daily  . metoprolol tartrate  100 mg Oral BID  . metroNIDAZOLE  500 mg Oral Q8H  . multivitamin with minerals  1 tablet Oral Daily  . nutrition supplement (JUVEN)  1 packet Oral BID BM  . sertraline  100 mg Oral BID  . sodium chloride flush  5 mL Intracatheter Q8H  . sodium zirconium cyclosilicate  10 g Oral Once   Continuous Infusions: . cefTRIAXone (ROCEPHIN)  IV 2 g (08/10/20 1012)   PRN Meds: docusate sodium, HYDROmorphone, ondansetron (ZOFRAN) IV, polyethylene glycol   Vital Signs    Vitals:   08/10/20 2103 08/11/20 0411 08/11/20 0737 08/11/20 0738  BP: (!) 138/96 (!) 127/108  (!) 116/98  Pulse: (!) 104 98  (!) 133  Resp:  19    Temp:  98.4 F (36.9 C) 98.5 F (36.9 C)   TempSrc:  Oral Oral   SpO2:  98%  97%  Weight:  94.3 kg    Height:        Intake/Output Summary (Last 24 hours) at 08/11/2020 0802 Last data filed at 08/11/2020 0413 Gross per 24 hour  Intake 480 ml  Output 1300 ml  Net -820 ml   Last 3 Weights 08/11/2020 08/10/2020 08/08/2020  Weight (lbs) 207 lb 14.3 oz 207 lb 0.2 oz 209 lb 3.5 oz  Weight (kg) 94.3 kg 93.9 kg 94.9 kg      Telemetry    Atrial fibrillation rate mildly elevated- Personally Reviewe  Physical Exam   GEN: No acute distress.   Neck: No JVD Cardiac: irregular and tachycardic Respiratory: Clear to auscultation bilaterally. GI: Soft,  s/p lower abdominal surgery MS: s/p right BKA Neuro:  Nonfocal  Psych: Normal affect   Labs    High Sensitivity Troponin:   Recent Labs  Lab 08/09/20 1105 08/09/20 1220  TROPONINIHS 13 17      Chemistry Recent Labs  Lab 08/09/20 0142 08/10/20 0122 08/11/20 0407  NA 140 142 141  K 5.5* 5.5* 5.6*  CL 112* 112* 112*  CO2 22 22 21*  GLUCOSE 198* 193* 163*  BUN 28* 24* 24*  CREATININE 1.48* 1.33* 1.19  CALCIUM 7.2* 7.7* 7.9*  PROT 6.0* 6.0* 6.0*  ALBUMIN 1.5* 1.6* 1.6*  AST 23 20 19   ALT 18 19 17   ALKPHOS 81 87 92  BILITOT 0.2* 0.4 0.3  GFRNONAA 58* >60 >60  ANIONGAP 6 8 8      Hematology Recent Labs  Lab 08/09/20 0142 08/10/20 0122 08/11/20 0407  WBC 12.6* 14.4* 10.3  RBC 2.97* 3.06* 3.24*  HGB 8.4* 8.8* 9.1*  HCT 26.6* 27.8* 30.1*  MCV 89.6 90.8 92.9  MCH 28.3 28.8 28.1  MCHC 31.6 31.7 30.2  RDW 18.0* 18.3* 18.1*  PLT 364 318 357    Patient Profile     50 y.o. male with past  medical history of diabetes mellitus, hypertension, previous CVA, paroxysmal atrial fibrillation presenting with Fournier's gangrene (status post surgical debridement) for evaluation of atrial fibrillation.  Echocardiogram shows ejection fraction 40 to 45%, mild RV dysfunction, mild left atrial enlargement.  Assessment & Plan    1. Paroxysmal atrial fibrillation-patient remains in atrial fibrillation today.  Rate upper normal to mildly elevated.  Continue metoprolol.  We will add digoxin 0.125 mg daily.  Follow heart rate and adjust regimen as needed.  Would add anticoagulation when okay with urology and all procedures complete.  Echocardiogram shows ejection fraction 40 to 45%.  2. Cardiomyopathy-ejection fraction 40 to 45%.  Continue beta-blocker.  Can consider adding ARB later but would not at this point given potassium 5.6.  Would repeat echocardiogram once all procedures are complete and he recovers from present illness.  Not clear that he would be a good candidate for aggressive  ischemia evaluation given comorbidities. 3. Hypertension-follow blood pressure and adjust regimen as needed. 4. Hyperlipidemia-continue statin. 5. Fournier's gangrene-management per urology.  Patient to be transferred to [facility] for reconstructive surgery. 6. HIV   For questions or updates, please contact [facility] Please consult [email or web address] for contact info under        Signed, Olga Millers, MD  08/11/2020, 8:02 AM

## 2020-08-12 DIAGNOSIS — N493 Fournier gangrene: Secondary | ICD-10-CM | POA: Diagnosis not present

## 2020-08-12 DIAGNOSIS — N4821 Abscess of corpus cavernosum and penis: Secondary | ICD-10-CM | POA: Diagnosis not present

## 2020-08-12 DIAGNOSIS — I4891 Unspecified atrial fibrillation: Secondary | ICD-10-CM | POA: Diagnosis not present

## 2020-08-12 DIAGNOSIS — R000 Tachycardia, unspecified: Secondary | ICD-10-CM | POA: Diagnosis not present

## 2020-08-12 LAB — HEPARIN LEVEL (UNFRACTIONATED)
Heparin Unfractionated: <0.1 IU/mL — ABNORMAL LOW (ref 0.30–0.70)
Heparin Unfractionated: <0.1 IU/mL — ABNORMAL LOW (ref 0.30–0.70)
Heparin Unfractionated: 0.16 IU/mL — ABNORMAL LOW (ref 0.30–0.70)
Heparin Unfractionated: 0.32 IU/mL (ref 0.30–0.70)

## 2020-08-12 LAB — COMPREHENSIVE METABOLIC PANEL
ALT: 15 U/L (ref 0–44)
AST: 15 U/L (ref 15–41)
Albumin: 1.7 g/dL — ABNORMAL LOW (ref 3.5–5.0)
Alkaline Phosphatase: 82 U/L (ref 38–126)
Anion gap: 6 (ref 5–15)
BUN: 22 mg/dL — ABNORMAL HIGH (ref 6–20)
CO2: 21 mmol/L — ABNORMAL LOW (ref 22–32)
Calcium: 7.8 mg/dL — ABNORMAL LOW (ref 8.9–10.3)
Chloride: 115 mmol/L — ABNORMAL HIGH (ref 98–111)
Creatinine, Ser: 1.21 mg/dL (ref 0.61–1.24)
GFR, Estimated: >60 mL/min (ref >60)
Glucose, Bld: 98 mg/dL (ref 70–99)
Potassium: 5.4 mmol/L — ABNORMAL HIGH (ref 3.5–5.1)
Sodium: 142 mmol/L (ref 135–145)
Total Bilirubin: 0.4 mg/dL (ref 0.3–1.2)
Total Protein: 6 g/dL — ABNORMAL LOW (ref 6.5–8.1)

## 2020-08-12 LAB — CBC WITH DIFFERENTIAL/PLATELET
Abs Immature Granulocytes: 0.05 10*3/uL (ref 0.00–0.07)
Basophils Absolute: 0.1 10*3/uL (ref 0.0–0.1)
Basophils Relative: 1 %
Eosinophils Absolute: 0.1 10*3/uL (ref 0.0–0.5)
Eosinophils Relative: 1 %
HCT: 30.5 % — ABNORMAL LOW (ref 39.0–52.0)
Hemoglobin: 9.8 g/dL — ABNORMAL LOW (ref 13.0–17.0)
Immature Granulocytes: 1 %
Lymphocytes Relative: 17 %
Lymphs Abs: 1.6 10*3/uL (ref 0.7–4.0)
MCH: 29.3 pg (ref 26.0–34.0)
MCHC: 32.1 g/dL (ref 30.0–36.0)
MCV: 91.3 fL (ref 80.0–100.0)
Monocytes Absolute: 0.4 10*3/uL (ref 0.1–1.0)
Monocytes Relative: 4 %
Neutro Abs: 7.3 10*3/uL (ref 1.7–7.7)
Neutrophils Relative %: 76 %
Platelets: 344 10*3/uL (ref 150–400)
RBC: 3.34 MIL/uL — ABNORMAL LOW (ref 4.22–5.81)
RDW: 18.1 % — ABNORMAL HIGH (ref 11.5–15.5)
WBC: 9.5 10*3/uL (ref 4.0–10.5)
nRBC: 0 % (ref 0.0–0.2)

## 2020-08-12 LAB — GLUCOSE, CAPILLARY
Glucose-Capillary: 76 mg/dL (ref 70–99)
Glucose-Capillary: 153 mg/dL — ABNORMAL HIGH (ref 70–99)
Glucose-Capillary: 188 mg/dL — ABNORMAL HIGH (ref 70–99)
Glucose-Capillary: 123 mg/dL — ABNORMAL HIGH (ref 70–99)

## 2020-08-12 LAB — PHOSPHORUS: Phosphorus: 3.3 mg/dL (ref 2.5–4.6)

## 2020-08-12 LAB — MAGNESIUM: Magnesium: 2.2 mg/dL (ref 1.7–2.4)

## 2020-08-12 MED ORDER — SODIUM POLYSTYRENE SULFONATE 15 GM/60ML PO SUSP
30.0000 g | Freq: Once | ORAL | Status: DC
  Filled 2020-08-12: qty 120

## 2020-08-12 MED ORDER — ASPIRIN EC 81 MG PO TBEC
81.0000 mg | DELAYED_RELEASE_TABLET | Freq: Every day | ORAL | Status: DC
  Administered 2020-08-12 – 2020-08-15 (×4): 81 mg via ORAL
  Filled 2020-08-12 (×4): qty 1

## 2020-08-12 NOTE — Progress Notes (Signed)
ANTICOAGULATION CONSULT NOTE   Pharmacy Consult for Heparin  Indication: atrial fibrillation  No Known Allergies  Patient Measurements: Height: 5\' 8"  (172.7 cm) Weight: 94.3 kg (207 lb 14.3 oz) IBW/kg (Calculated) : 68.4 HEPARIN DW (KG): 92.9   Vital Signs: Temp: 97.6 F (36.4 C) (01/02 0428) Temp Source: Oral (01/02 0428) BP: 141/99 (01/02 0428) Pulse Rate: 86 (01/02 0428)  Labs: Recent Labs    08/09/20 1105 08/09/20 1220 08/10/20 0122 08/10/20 0122 08/11/20 0407 08/11/20 2351 08/12/20 0220  HGB  --   --  8.8*   < > 9.1* 9.8*  --   HCT  --   --  27.8*  --  30.1* 30.5*  --   PLT  --   --  318  --  357 344  --   HEPARINUNFRC  --   --   --   --   --  <0.10* <0.10*  CREATININE  --   --  1.33*  --  1.19 1.21  --   TROPONINIHS 13 17  --   --   --   --   --    < > = values in this interval not displayed.    Estimated Creatinine Clearance: 84.1 mL/min (by C-G formula based on SCr of 1.21 mg/dL).   Medical History: Past Medical History:  Diagnosis Date  . Allergy   . Anxiety and depression 05/29/2017  . Atrial fibrillation ([facility])   . Diabetes mellitus without complication ([facility])    type 2  . HIV (human immunodeficiency virus infection) ([facility])   . Hyperlipidemia   . Hypertension   . Stroke [facility])     Medications:   . sodium chloride   Intravenous Once  . atorvastatin  40 mg Oral Daily  . Chlorhexidine Gluconate Cloth  6 each Topical Daily  . digoxin  0.125 mg Oral Daily  . dolutegravir  50 mg Oral Daily  . emtricitabine-tenofovir AF  1 tablet Oral QHS  . feeding supplement (GLUCERNA SHAKE)  237 mL Oral TID BM  . insulin aspart  0-5 Units Subcutaneous QHS  . insulin aspart  0-9 Units Subcutaneous TID WC  . insulin glargine  15 Units Subcutaneous Daily  . metoprolol tartrate  100 mg Oral BID  . metroNIDAZOLE  500 mg Oral Q8H  . multivitamin with minerals  1 tablet Oral Daily  . nutrition supplement (JUVEN)  1 packet Oral BID BM  . sertraline  100 mg Oral BID   . sodium chloride flush  5 mL Intracatheter Q8H  . sodium zirconium cyclosilicate  10 g Oral Once    Assessment: 50 yo male with afib (CHADSVASC= 5). Pharmacy consulted to dose heparin. He is also noted with necrotizing fasciitis of the penis and perineum and plans reconstruction after transfer to [facility].  Also s/p operative debridement 12/22, 12/24 and 12/26 -hg= 9.1  1/2 AM update:  Heparin level undetectable No issues per RN  Goal of Therapy:  Heparin level 0.3-0.7 units/ml Monitor platelets by anticoagulation protocol: Yes   Plan:  -No heparin bolus due to recent procedures -Inc heparin to 1550 units/hr -Heparin level in 8 hours and daily wth CBC daily  1/27, PharmD, BCPS Clinical Pharmacist Phone: [phone]

## 2020-08-12 NOTE — Progress Notes (Signed)
ANTICOAGULATION CONSULT NOTE   Pharmacy Consult for Heparin  Indication: atrial fibrillation  No Known Allergies  Patient Measurements: Height: 5\' 8"  (172.7 cm) Weight: 94.3 kg (207 lb 14.3 oz) IBW/kg (Calculated) : 68.4 kg HEPARIN DW (KG): 92.9 kg   Vital Signs: Temp: 97.6 F (36.4 C) (01/02 0428) Temp Source: Oral (01/02 0428) BP: 141/99 (01/02 0428) Pulse Rate: 86 (01/02 0428)  Labs: Recent Labs    08/09/20 1105 08/09/20 1220 08/10/20 0122 08/10/20 0122 08/11/20 0407 08/11/20 2351 08/12/20 0220  HGB  --   --  8.8*   < > 9.1* 9.8*  --   HCT  --   --  27.8*  --  30.1* 30.5*  --   PLT  --   --  318  --  357 344  --   HEPARINUNFRC  --   --   --   --   --  <0.10* <0.10*  CREATININE  --   --  1.33*  --  1.19 1.21  --   TROPONINIHS 13 17  --   --   --   --   --    < > = values in this interval not displayed.    Estimated Creatinine Clearance: 84.1 mL/min (by C-G formula based on SCr of 1.21 mg/dL).   Medical History: Past Medical History:  Diagnosis Date  . Allergy   . Anxiety and depression 05/29/2017  . Atrial fibrillation ([facility])   . Diabetes mellitus without complication ([facility])    type 2  . HIV (human immunodeficiency virus infection) ([facility])   . Hyperlipidemia   . Hypertension   . Stroke [facility])     Medications:   . sodium chloride   Intravenous Once  . atorvastatin  40 mg Oral Daily  . Chlorhexidine Gluconate Cloth  6 each Topical Daily  . digoxin  0.125 mg Oral Daily  . dolutegravir  50 mg Oral Daily  . emtricitabine-tenofovir AF  1 tablet Oral QHS  . feeding supplement (GLUCERNA SHAKE)  237 mL Oral TID BM  . insulin aspart  0-5 Units Subcutaneous QHS  . insulin aspart  0-9 Units Subcutaneous TID WC  . insulin glargine  15 Units Subcutaneous Daily  . metoprolol tartrate  100 mg Oral BID  . metroNIDAZOLE  500 mg Oral Q8H  . multivitamin with minerals  1 tablet Oral Daily  . nutrition supplement (JUVEN)  1 packet Oral BID BM  . sertraline  100 mg  Oral BID  . sodium chloride flush  5 mL Intracatheter Q8H  . sodium zirconium cyclosilicate  10 g Oral Once    Assessment: 50 yo male with afib (CHADSVASC = 5) presented with Fournier's gangrene and necrotizing fasciitis of the penis and perienum. The pt is s/p operative debridement 12/22, 12/24 and 12/26. The pt is planned to transfer to [facility] for reconstruction. PTA the patient is not on anticoagulation. Pharmacy is consulted to dose heparin.  The heparin level is subtherapeutic at 0.16 while running at 1550 units/hr. Per the RN, there were no issues with the infusion and the patient is without signs or symptoms of bleeding. CBC is stable.  Goal of Therapy:  Heparin level 0.3-0.7 units/ml Monitor platelets by anticoagulation protocol: Yes   Plan:  Increase heparin IV to 1900 units/hr Obtain a 6-hr heparin level Monitor daily heparin level and CBC Monitor for signs and symptoms of bleeding  [facility], PharmD, RPh  PGY-1 Pharmacy Resident 08/12/2020 7:02 AM  Please check [email or web address] for unit-specific  pharmacy phone numbers.

## 2020-08-12 NOTE — Progress Notes (Signed)
PROGRESS NOTE    Francisco Hutchinson  [id number] DOB: 12-Aug-1970 DOA: 08/01/2020 PCP: [name], MD   Brief Narrative: The patient is a 50 year old nursing home resident with a past medical history significant for but not limited to hypertension, HIV, CVA, diabetes mellitus type 2, anxiety and depression, as well as other comorbidities who presented to the emergency room with septic shock due to Fournier's gangrene neurology is consulted and patient was taken urgently to the OR for incision and drainage.  He was then taken back to the OR on 08/05/2020 for a washout and debridement of the penile and perineal abscess at the patient has had necrotizing fasciitis of the penis and perineum.  He was transferred to [facility] service 08/05/2020.  Wound culture wound 40 is in blood cultures have been negative.  MRSA PCR was negative and Covid was negative as well as flu.  He was started on IV Zosyn and changed to IV Ceftriaxone.  He underwent a suprapubic catheter placement on 08/06/2020 by interventional radiology  On 08/08/2020 his blood count had dropped to 7.1 so he be typed and screened and transfused 1 unit of PRBCs as his blood count has been slowly trending down.  Repeat blood count 08/09/2020 is now 8.4/26.6 and stable.  Renal function is improving but he was little hyperkalemic.  He is given bolus of Lokelma.  He was found to be in new onset atrial fibrillation with RVR and was given metoprolol push.  Cardiology yesterday given his new A. fib with RVR  Cardiology evaluated for his proximal atrial fibrillation with RVR and because this occurred in the setting of Fournier's gangrene they checked a TSH for completeness and recommended continue metoprolol 100 mg p.o. twice daily for rate control and also added Cardizem 30 mg every 6 hours and recommended possibly changing to Cardizem CD 120 mg today if heart rate and blood pressure remained stable.  His CHA2DS2-VASc score was 5 and so they recommended  starting anticoagulation but only when it is okay with urology I spoke with Dr. Milford Cage of urology who okayed the heparin drip for now and when his urological procedures are improved and they are recommending Xarelto p.o.  Currently have started the transfer process to [facility] for reconstructive surgery to the care of Dr. Francesca Jewett per urology's recommendations but they are requesting urology to urology discussion after Monday and I spoke with Dr. Milford Cage and Dr. McDiarmid will call Dr. Francesca Jewett when he is back next week.  08/12/2020: He on a heparin drip yesterday and he is tolerating this well.  His lab values are improving and his potassium is slightly improved from yesterday but we will give him on low dose of Kayexalate.  His WBC is now resolved and renal function remains relatively stable.  We will discuss by urology about transferring the patient to [facility] this week.  Assessment & Plan:   Active Problems:   Fourniers gangrene   Penile abscess   Pressure injury of skin   Hypotension   Tachycardia   Fournier's Gangrene and necrotizing fasciitis of the penis and perineum  -Presented with septic shock and is admitted to [facility] and and transferred to [facility] service 08/05/2020 -He is status post debridement twice by Urology.  Culture growing Proteus so will continue IV Zosyn but chjanged to IV Ceftriaxone and also added Flagyl; need to continue monitor carefully and if necessary will transition back to IV Zosyn -Patient scheduled to have suprapubic catheter placed by interventional radiology and this was done  08/06/20 -Patient afebrile, WBC was trending down from Admission (27.5) and has now stabilized and has now gone from 14.4 -> 14.5 -> 13.8 -> 12.6 slightly worse at 14.4 -Urology recommending changing the dressings wet-to-dry daily as his granulation tissue looks fairly well.  Urology recommending transferred to [facility] later this week to the care of Dr. Francesca Jewett for reconstruction and process has been initiated and  per my discussion with the [facility] transfer coordinator as he is at capacity and recommending checking back next week -Discussed with urology and they are okay with him starting  heparin drip given his history of CVA and CHA2DS2-VASc score of 5 -I spoke with urology at [facility] Dr. Stanford Breed on 08/10/20 who is the fellow there and he feels that they do not have any intermediate beds currently still and they cannot accept the patient at this time.  He recommended calling back after Sunday when his partner Dr. Francesca Jewett is back from vacation and recommended that the urology team call to figure out what exactly the patient will require in terms of reconstruction.  We will reach out to our urology team to have them call after Sunday for transfer for this patient as it likely needs to be a urology to urology consult  HIV  -C/w Dolutegravir 50 mg po daily and Emticitabine-Tenofovir 200-25 mg po daily  Hx of CVA -Was on ASA pta and has ben held for now and will resume now -C/w Atorvastatin 40 mg po Daily   Uncontrolled Diabetes Mellitus Type 2 -C/w Lantus 15 units sq daily; patient takes dulaglutide injection 1.5 mg in the skin once a week, Jardiance 10 mg p.o. daily, and Lantus 55 units in the skin subcu at bedtime -C/w Sensitive Novolog SSI AC -CBG's ranging from 76-153 -HbA1c is 8.8 -Continue to Monitor Blood Sugars carefully and adjust insulin as Necessary  Anxiety and Depression -C/w Sertraline 100 mg po BID  Hyponatremia -Improved. Na+ is now 142 -Continue to Monitor and Trend -IV fluid hydration is now stopped -Repeat CMP in the AM   HTN -C/w Metoprolol 100 mg po BID -Currently his hydrochlorothiazide 25 mg p.o. daily currently being held given that he is getting IV fluid hydration -His amlodipine 5 mg p.o. daily also held given his lower blood pressures as his last blood pressure was 130/104 -continue monitor blood pressures per protocol  HLD -C/w Atorvastatin 40 mg p.o. daily; patient no  longer taking fenofibrate   AKI on CKD Stage 3a -Patient's BUN/Cr is remarkably improved and is now 24/1.19 yesterday and today is 22/1.21 -IV fluid hydration is now stopped -Avoid Nephrotoxic Medications, Contrast Dyes, Hypotension and Renally adjust Medications -Continue to Monitor and Trend Renal Fxn and Repeat CMP in the AM   Hyperkalemia -Patient had a mildly elevated potassium at 5.4 today -Patient has been refusing his Lokelma so we will reorder another Kayexalate dosing -Continue monitor trend and repeat CMP in a.m.  Normocytic Anemia/Anemia of Chronic Disease -Patient's Hgb/Hct is now 9.8/30.5 and stable  -Patient is status post 2 units of PRBCs -Continue to Monitor for S/Sx of Bleeding now that we will start him on heparin drip per urology's clearance; Currently no overt bleeding noted -Repeat CBC in the AM  New Onset A. fib with RVR, improved initially but back in RVR this morning -Patient had elevated heart rates once again this morning -EKG done in confirmed A. Fib -Echo done recently showed an EF of 40 to 45% -Avoiding Cardizem given his low EF cardiology wants to trial  p.o. Cardizem and started him on 30 every 6 scheduled; cardiology starting the patient on digoxin 0.125 mg p.o. daily and recommended anticoagulation so I spoke with urology and will start the patient on a heparin drip as they have given the okay and clearance for it;  -Continue with metoprolol tartrate 100 g p.o. twice daily and given 5 mg of IV metoprolol push -Cardiology was consulted yesterday and they are recommending trialing Cardizem p.o. in addition to metoprolol; now Cardizem has been discontinued and he is on digoxin 0.125 mg p.o. daily as well as metoprolol tartrate 100 mg p.o. twice daily -HR has been better controlled  -Patient's CHA2DS2-VASc score is at least 5 for CHF, hypertension, diabetes, and CVA -Anticoagulation is to begin with a heparin drip -Heart rate is improved and is in the low  100s -Troponin was obtained and first was 13 and next one was 17 -Continue monitor on telemetry -He will need anticoagulation given elevated CHA2DS2-VASc score but cardiology recommending holding off p.o. anticoagulation until his urological procedures are done and recommending Xarelto p.o.  In the interim we will start the patient on heparin drip as Dr. Milford Cage has okayed anticoagulation at this time  Pressure Injuries, poA Pressure Injury 03/24/17 Stage II -  Partial thickness loss of dermis presenting as a shallow open ulcer with a red, pink wound bed without slough. (Active)  03/24/17 1131  Location: Elbow  Location Orientation: Right  Staging: Stage II -  Partial thickness loss of dermis presenting as a shallow open ulcer with a red, pink wound bed without slough.  Wound Description (Comments):   Present on Admission: Yes     Pressure Injury 08/05/20 Hip Anterior;Left Stage 4 - Full thickness tissue loss with exposed bone, tendon or muscle. (Active)  08/05/20 2000  Location: Hip  Location Orientation: Anterior;Left  Staging: Stage 4 - Full thickness tissue loss with exposed bone, tendon or muscle.  Wound Description (Comments):   Present on Admission: Yes     Pressure Injury 08/05/20 Heel Left Deep Tissue Pressure Injury - Purple or maroon localized area of discolored intact skin or blood-filled blister due to damage of underlying soft tissue from pressure and/or shear. (Active)  08/05/20 2000  Location: Heel  Location Orientation: Left  Staging: Deep Tissue Pressure Injury - Purple or maroon localized area of discolored intact skin or blood-filled blister due to damage of underlying soft tissue from pressure and/or shear.  Wound Description (Comments):   Present on Admission: Yes     Pressure Injury 08/07/20 Sacrum Mid;Lower Stage 2 -  Partial thickness loss of dermis presenting as a shallow open injury with a red, pink wound bed without slough. (Active)  08/07/20 0800   Location: Sacrum  Location Orientation: Mid;Lower  Staging: Stage 2 -  Partial thickness loss of dermis presenting as a shallow open injury with a red, pink wound bed without slough.  Wound Description (Comments):   Present on Admission:    Obesity -Complicates overall prognosis and care -Estimated body mass index is 31.61 kg/m as calculated from the following:   Height as of this encounter: 5\' 8"  (1.727 m).   Weight as of this encounter: 94.3 kg. -Weight Loss and Dietary Counseling given  DVT prophylaxis: SCDs; now we will start the patient on a heparin drip Code Status: FULL CODE  Family Communication: No family present at bedside  Disposition Plan: Likely Transfer [facility] later this week per Urology's recommendations.  Processes been initiated  Status is: Inpatient  Remains inpatient  appropriate because:Unsafe d/c plan, IV treatments appropriate due to intensity of illness or inability to take PO and Inpatient level of care appropriate due to severity of illness   Dispo: The patient is from: Home              Anticipated d/c is to: SNF              Anticipated d/c date is: > 3 days              Patient currently is not medically stable to d/c.  Consultants:   PCCM Transfer  Urology  Interventional Radiology    Procedures:  Procedure(s): 1.  Washout and debridement of penile and perineal abscess  Antimicrobials:  Anti-infectives (From admission, onward)   Start     Dose/Rate Route Frequency Ordered Stop   08/09/20 1400  metroNIDAZOLE (FLAGYL) tablet 500 mg        500 mg Oral Every 8 hours 08/09/20 0956     08/06/20 1530  cefTRIAXone (ROCEPHIN) 2 g in sodium chloride 0.9 % 100 mL IVPB        2 g 200 mL/hr over 30 Minutes Intravenous Every 24 hours 08/06/20 1432     08/04/20 2200  emtricitabine-tenofovir AF (DESCOVY) 200-25 MG per tablet 1 tablet        1 tablet Oral Daily at bedtime 08/04/20 0926     08/04/20 1015  dolutegravir (TIVICAY) tablet 50 mg        50 mg  Oral Daily 08/04/20 0926     08/03/20 1800  piperacillin-tazobactam (ZOSYN) IVPB 3.375 g  Status:  Discontinued        3.375 g 12.5 mL/hr over 240 Minutes Intravenous Every 8 hours 08/03/20 1719 08/06/20 1432   08/02/20 1000  vancomycin (VANCOCIN) IVPB 1000 mg/200 mL premix  Status:  Discontinued        1,000 mg 200 mL/hr over 60 Minutes Intravenous Every 12 hours 08/02/20 0354 08/04/20 0924   08/02/20 0600  ceFEPIme (MAXIPIME) 2 g in sodium chloride 0.9 % 100 mL IVPB  Status:  Discontinued        2 g 200 mL/hr over 30 Minutes Intravenous Every 8 hours 08/01/20 2148 08/02/20 0345   08/02/20 0600  piperacillin-tazobactam (ZOSYN) IVPB 3.375 g  Status:  Discontinued        3.375 g 12.5 mL/hr over 240 Minutes Intravenous Every 8 hours 08/02/20 0328 08/03/20 1719   08/01/20 2230  vancomycin (VANCOCIN) IVPB 1000 mg/200 mL premix        1,000 mg 200 mL/hr over 60 Minutes Intravenous  Once 08/01/20 2225 08/02/20 0028   08/01/20 2230  clindamycin (CLEOCIN) IVPB 600 mg        600 mg 100 mL/hr over 30 Minutes Intravenous  Once 08/01/20 2225 08/01/20 2320   08/01/20 2045  ceFEPIme (MAXIPIME) 2 g in sodium chloride 0.9 % 100 mL IVPB        2 g 200 mL/hr over 30 Minutes Intravenous  Once 08/01/20 2041 08/01/20 2249       Subjective: Seen and examined at bedside and he was resting and his heart rate was better today.  He denied any complaints or pain.  No nausea or vomiting.  States that he slept okay.  No other concerns or plans at this time.  Objective: Vitals:   08/12/20 0054 08/12/20 0428 08/12/20 0744 08/12/20 1150  BP: (!) 141/100 (!) 141/99 (!) 137/100 (!) 130/104  Pulse: 83 86 95 (!) 110  Resp: 18 18 19 18   Temp: 98.3 F (36.8 C) 97.6 F (36.4 C) (!) 97.5 F (36.4 C)   TempSrc: Oral Oral Oral Oral  SpO2: 99% 99%  97%  Weight:      Height:        Intake/Output Summary (Last 24 hours) at 08/12/2020 1422 Last data filed at 08/12/2020 1000 Gross per 24 hour  Intake 323.01 ml  Output  2600 ml  Net -2276.99 ml   Filed Weights   08/08/20 0641 08/10/20 0425 08/11/20 0411  Weight: 94.9 kg 93.9 kg 94.3 kg   Examination: Physical Exam:  Constitutional: WN/WD chronically ill-appearing Caucasian male currently no acute distress appears calm and resting Eyes: Lids and conjunctivae normal, sclerae anicteric  ENMT: External Ears, Nose appear normal. Grossly normal hearing. Neck: Appears normal, supple, no cervical masses, normal ROM, no appreciable thyromegaly; no JVD Respiratory: Diminished to auscultation bilaterally with coarse breath sounds, no wheezing, rales, rhonchi or crackles. Normal respiratory effort and patient is not tachypenic. No accessory muscle use. Unlabored breathing   Cardiovascular: Irregularly Irregular and mildly tachycardic; No extremity edema. 2+ pedal pulses. No carotid bruits.  Abdomen: Soft, non-tender, Distended 2/2 body habitus. Bowel sounds positive.  GU: Deferred. Foley catheter in place and has a PCN Musculoskeletal: No clubbing / cyanosis of digits/nails. Has a Right BKA Skin: Has a Left Hip Lesion. No induration; Warm and dry.  Neurologic: CN 2-12 grossly intact with no focal deficits. Romberg sign and cerebellar reflexes not assessed.  Psychiatric: Normal judgment and insight. Alert and oriented x 3. Normal mood and appropriate affect.   Data Reviewed: I have personally reviewed following labs and imaging studies  CBC: Recent Labs  Lab 08/08/20 0223 08/09/20 0142 08/10/20 0122 08/11/20 0407 08/11/20 2351  WBC 13.8* 12.6* 14.4* 10.3 9.5  NEUTROABS 10.9* 10.0* 12.3* 8.3* 7.3  HGB 7.1* 8.4* 8.8* 9.1* 9.8*  HCT 23.1* 26.6* 27.8* 30.1* 30.5*  MCV 90.9 89.6 90.8 92.9 91.3  PLT 369 364 318 357 344   Basic Metabolic Panel: Recent Labs  Lab 08/08/20 0223 08/09/20 0142 08/10/20 0122 08/11/20 0407 08/11/20 2351  NA 139 140 142 141 142  K 5.0 5.5* 5.5* 5.6* 5.4*  CL 110 112* 112* 112* 115*  CO2 23 22 22  21* 21*  GLUCOSE 148* 198*  193* 163* 98  BUN 32* 28* 24* 24* 22*  CREATININE 1.85* 1.48* 1.33* 1.19 1.21  CALCIUM 7.3* 7.2* 7.7* 7.9* 7.8*  MG 2.4 2.5* 2.1 2.2 2.2  PHOS 3.2 3.5 3.2 3.4 3.3   GFR: Estimated Creatinine Clearance: 84.1 mL/min (by C-G formula based on SCr of 1.21 mg/dL). Liver Function Tests: Recent Labs  Lab 08/08/20 0223 08/09/20 0142 08/10/20 0122 08/11/20 0407 08/11/20 2351  AST 15 23 20 19 15   ALT 15 18 19 17 15   ALKPHOS 66 81 87 92 82  BILITOT 0.4 0.2* 0.4 0.3 0.4  PROT 5.5* 6.0* 6.0* 6.0* 6.0*  ALBUMIN 1.4* 1.5* 1.6* 1.6* 1.7*   No results for input(s): LIPASE, AMYLASE in the last 168 hours. No results for input(s): AMMONIA in the last 168 hours. Coagulation Profile: No results for input(s): INR, PROTIME in the last 168 hours. Cardiac Enzymes: No results for input(s): CKTOTAL, CKMB, CKMBINDEX, TROPONINI in the last 168 hours. BNP (last 3 results) No results for input(s): PROBNP in the last 8760 hours. HbA1C: No results for input(s): HGBA1C in the last 72 hours. CBG: Recent Labs  Lab 08/11/20 1204 08/11/20 1608 08/11/20 2124 08/12/20 0743 08/12/20  [place]* 173* 104* 76 153*   Lipid Profile: No results for input(s): CHOL, HDL, LDLCALC, TRIG, CHOLHDL, LDLDIRECT in the last 72 hours. Thyroid Function Tests: Recent Labs    08/10/20 0122 08/10/20 0955  TSH 4.775*  --   FREET4  --  0.91   Anemia Panel: No results for input(s): VITAMINB12, FOLATE, FERRITIN, TIBC, IRON, RETICCTPCT in the last 72 hours. Sepsis Labs: No results for input(s): PROCALCITON, LATICACIDVEN in the last 168 hours.  Recent Results (from the past 240 hour(s))  Surgical pcr screen     Status: None   Collection Time: 08/05/20  5:08 AM   Specimen: Nasal Mucosa; Nasal Swab  Result Value Ref Range Status   MRSA, PCR NEGATIVE NEGATIVE Final   Staphylococcus aureus NEGATIVE NEGATIVE Final    Comment: (NOTE) The Xpert SA Assay (FDA approved for NASAL specimens in patients 31 years of age and  older), is one component of a comprehensive surveillance program. It is not intended to diagnose infection nor to guide or monitor treatment. Performed at [facility], [place] [address], [place], [place] [address]      RN Pressure Injury Documentation: Pressure Injury 03/24/17 Stage II -  Partial thickness loss of dermis presenting as a shallow open ulcer with a red, pink wound bed without slough. (Active)  03/24/17 1131  Location: Elbow  Location Orientation: Right  Staging: Stage II -  Partial thickness loss of dermis presenting as a shallow open ulcer with a red, pink wound bed without slough.  Wound Description (Comments):   Present on Admission: Yes     Pressure Injury 08/05/20 Hip Anterior;Left Stage 4 - Full thickness tissue loss with exposed bone, tendon or muscle. (Active)  08/05/20 2000  Location: Hip  Location Orientation: Anterior;Left  Staging: Stage 4 - Full thickness tissue loss with exposed bone, tendon or muscle.  Wound Description (Comments):   Present on Admission: Yes     Pressure Injury 08/05/20 Heel Left Deep Tissue Pressure Injury - Purple or maroon localized area of discolored intact skin or blood-filled blister due to damage of underlying soft tissue from pressure and/or shear. (Active)  08/05/20 2000  Location: Heel  Location Orientation: Left  Staging: Deep Tissue Pressure Injury - Purple or maroon localized area of discolored intact skin or blood-filled blister due to damage of underlying soft tissue from pressure and/or shear.  Wound Description (Comments):   Present on Admission: Yes     Pressure Injury 08/07/20 Sacrum Mid;Lower Stage 2 -  Partial thickness loss of dermis presenting as a shallow open injury with a red, pink wound bed without slough. (Active)  08/07/20 0800  Location: Sacrum  Location Orientation: Mid;Lower  Staging: Stage 2 -  Partial thickness loss of dermis presenting as a shallow open injury with a red, pink wound bed without  slough.  Wound Description (Comments):   Present on Admission:    Estimated body mass index is 31.61 kg/m as calculated from the following:   Height as of this encounter: 5\' 8"  (1.727 m).   Weight as of this encounter: 94.3 kg.  Malnutrition Type:  Nutrition Problem: Increased nutrient needs Etiology: wound healing  Malnutrition Characteristics:  Signs/Symptoms: estimated needs  Nutrition Interventions:  Interventions: Ensure Enlive (each supplement provides 350kcal and 20 grams of protein),MVI   Radiology Studies: No results found. Scheduled Meds: . sodium chloride   Intravenous Once  . atorvastatin  40 mg Oral Daily  . Chlorhexidine Gluconate Cloth  6 each  Topical Daily  . digoxin  0.125 mg Oral Daily  . dolutegravir  50 mg Oral Daily  . emtricitabine-tenofovir AF  1 tablet Oral QHS  . feeding supplement (GLUCERNA SHAKE)  237 mL Oral TID BM  . insulin aspart  0-5 Units Subcutaneous QHS  . insulin aspart  0-9 Units Subcutaneous TID WC  . insulin glargine  15 Units Subcutaneous Daily  . metoprolol tartrate  100 mg Oral BID  . metroNIDAZOLE  500 mg Oral Q8H  . multivitamin with minerals  1 tablet Oral Daily  . nutrition supplement (JUVEN)  1 packet Oral BID BM  . sertraline  100 mg Oral BID  . sodium chloride flush  5 mL Intracatheter Q8H  . sodium polystyrene  30 g Oral Once   Continuous Infusions: . cefTRIAXone (ROCEPHIN)  IV 2 g (08/12/20 0941)  . heparin 1,550 Units/hr (08/12/20 0606)    LOS: 10 days   Kerney Elbe, DO [facility] PAGER is on AMION  If 7PM-7AM, please contact night-coverage [email or web address]

## 2020-08-12 NOTE — Plan of Care (Signed)

## 2020-08-12 NOTE — Progress Notes (Signed)
ANTICOAGULATION CONSULT NOTE   Pharmacy Consult for Heparin  Indication: atrial fibrillation  No Known Allergies  Patient Measurements: Height: 5\' 8"  (172.7 cm) Weight: 94.3 kg (207 lb 14.3 oz) IBW/kg (Calculated) : 68.4 HEPARIN DW (KG): 92.9   Vital Signs: Temp: 98.6 F (37 C) (01/02 2100) Temp Source: Oral (01/02 2100) BP: 145/113 (01/02 2100) Pulse Rate: 89 (01/02 2100)  Labs: Recent Labs    08/10/20 0122 08/11/20 0407 08/11/20 2351 08/11/20 2351 08/12/20 0220 08/12/20 1444 08/12/20 2222  HGB 8.8* 9.1* 9.8*  --   --   --   --   HCT 27.8* 30.1* 30.5*  --   --   --   --   PLT 318 357 344  --   --   --   --   HEPARINUNFRC  --   --  <0.10*   < > <0.10* 0.16* 0.32  CREATININE 1.33* 1.19 1.21  --   --   --   --    < > = values in this interval not displayed.    Estimated Creatinine Clearance: 84.1 mL/min (by C-G formula based on SCr of 1.21 mg/dL).   Medical History: Past Medical History:  Diagnosis Date  . Allergy   . Anxiety and depression 05/29/2017  . Atrial fibrillation (HCC)   . Diabetes mellitus without complication (HCC)    type 2  . HIV (human immunodeficiency virus infection) (HCC)   . Hyperlipidemia   . Hypertension   . Stroke [facility])     Medications:   . sodium chloride   Intravenous Once  . aspirin EC  81 mg Oral QHS  . atorvastatin  40 mg Oral Daily  . Chlorhexidine Gluconate Cloth  6 each Topical Daily  . digoxin  0.125 mg Oral Daily  . dolutegravir  50 mg Oral Daily  . emtricitabine-tenofovir AF  1 tablet Oral QHS  . feeding supplement (GLUCERNA SHAKE)  237 mL Oral TID BM  . insulin aspart  0-5 Units Subcutaneous QHS  . insulin aspart  0-9 Units Subcutaneous TID WC  . insulin glargine  15 Units Subcutaneous Daily  . metoprolol tartrate  100 mg Oral BID  . metroNIDAZOLE  500 mg Oral Q8H  . multivitamin with minerals  1 tablet Oral Daily  . nutrition supplement (JUVEN)  1 packet Oral BID BM  . sertraline  100 mg Oral BID  . sodium  chloride flush  5 mL Intracatheter Q8H  . sodium polystyrene  30 g Oral Once    Assessment: 50 yo male with afib (CHADSVASC= 5). Pharmacy consulted to dose heparin. He is also noted with necrotizing fasciitis of the penis and perineum and plans reconstruction after transfer to [facility].  Also s/p operative debridement 12/22, 12/24 and 12/26 -hg= 9.1  1/2 PM update:  Heparin level therapeutic after rate increase  Goal of Therapy:  Heparin level 0.3-0.7 units/ml Monitor platelets by anticoagulation protocol: Yes   Plan:  -Cont heparin 1900 units/hr -Confirmatory heparin level with AM labs  1/27, PharmD, BCPS Clinical Pharmacist Phone: [phone]

## 2020-08-13 DIAGNOSIS — I4819 Other persistent atrial fibrillation: Secondary | ICD-10-CM | POA: Diagnosis not present

## 2020-08-13 DIAGNOSIS — E1151 Type 2 diabetes mellitus with diabetic peripheral angiopathy without gangrene: Secondary | ICD-10-CM

## 2020-08-13 DIAGNOSIS — R000 Tachycardia, unspecified: Secondary | ICD-10-CM | POA: Diagnosis not present

## 2020-08-13 DIAGNOSIS — Z89511 Acquired absence of right leg below knee: Secondary | ICD-10-CM | POA: Diagnosis not present

## 2020-08-13 DIAGNOSIS — I4891 Unspecified atrial fibrillation: Secondary | ICD-10-CM | POA: Diagnosis not present

## 2020-08-13 DIAGNOSIS — N493 Fournier gangrene: Secondary | ICD-10-CM | POA: Diagnosis not present

## 2020-08-13 DIAGNOSIS — N4821 Abscess of corpus cavernosum and penis: Secondary | ICD-10-CM | POA: Diagnosis not present

## 2020-08-13 LAB — CBC WITH DIFFERENTIAL/PLATELET
Abs Immature Granulocytes: 0.05 10*3/uL (ref 0.00–0.07)
Basophils Absolute: 0.1 10*3/uL (ref 0.0–0.1)
Basophils Relative: 1 %
Eosinophils Absolute: 0.1 10*3/uL (ref 0.0–0.5)
Eosinophils Relative: 1 %
HCT: 33.9 % — ABNORMAL LOW (ref 39.0–52.0)
Hemoglobin: 10.6 g/dL — ABNORMAL LOW (ref 13.0–17.0)
Immature Granulocytes: 1 %
Lymphocytes Relative: 13 %
Lymphs Abs: 1.3 10*3/uL (ref 0.7–4.0)
MCH: 29.1 pg (ref 26.0–34.0)
MCHC: 31.3 g/dL (ref 30.0–36.0)
MCV: 93.1 fL (ref 80.0–100.0)
Monocytes Absolute: 0.4 10*3/uL (ref 0.1–1.0)
Monocytes Relative: 4 %
Neutro Abs: 8.1 10*3/uL — ABNORMAL HIGH (ref 1.7–7.7)
Neutrophils Relative %: 80 %
Platelets: 312 10*3/uL (ref 150–400)
RBC: 3.64 MIL/uL — ABNORMAL LOW (ref 4.22–5.81)
RDW: 18.3 % — ABNORMAL HIGH (ref 11.5–15.5)
WBC: 10 10*3/uL (ref 4.0–10.5)
nRBC: 0 % (ref 0.0–0.2)

## 2020-08-13 LAB — COMPREHENSIVE METABOLIC PANEL
ALT: 15 U/L (ref 0–44)
AST: 17 U/L (ref 15–41)
Albumin: 1.7 g/dL — ABNORMAL LOW (ref 3.5–5.0)
Alkaline Phosphatase: 95 U/L (ref 38–126)
Anion gap: 6 (ref 5–15)
BUN: 21 mg/dL — ABNORMAL HIGH (ref 6–20)
CO2: 18 mmol/L — ABNORMAL LOW (ref 22–32)
Calcium: 7.9 mg/dL — ABNORMAL LOW (ref 8.9–10.3)
Chloride: 114 mmol/L — ABNORMAL HIGH (ref 98–111)
Creatinine, Ser: 1.17 mg/dL (ref 0.61–1.24)
GFR, Estimated: >60 mL/min (ref >60)
Glucose, Bld: 162 mg/dL — ABNORMAL HIGH (ref 70–99)
Potassium: 5.6 mmol/L — ABNORMAL HIGH (ref 3.5–5.1)
Sodium: 138 mmol/L (ref 135–145)
Total Bilirubin: 0.7 mg/dL (ref 0.3–1.2)
Total Protein: 6.2 g/dL — ABNORMAL LOW (ref 6.5–8.1)

## 2020-08-13 LAB — GLUCOSE, CAPILLARY
Glucose-Capillary: 108 mg/dL — ABNORMAL HIGH (ref 70–99)
Glucose-Capillary: 114 mg/dL — ABNORMAL HIGH (ref 70–99)
Glucose-Capillary: 184 mg/dL — ABNORMAL HIGH (ref 70–99)
Glucose-Capillary: 248 mg/dL — ABNORMAL HIGH (ref 70–99)

## 2020-08-13 LAB — MAGNESIUM: Magnesium: 2.1 mg/dL (ref 1.7–2.4)

## 2020-08-13 LAB — HEPARIN LEVEL (UNFRACTIONATED): Heparin Unfractionated: 0.35 IU/mL (ref 0.30–0.70)

## 2020-08-13 LAB — PHOSPHORUS: Phosphorus: 3.8 mg/dL (ref 2.5–4.6)

## 2020-08-13 MED ORDER — SODIUM POLYSTYRENE SULFONATE 15 GM/60ML PO SUSP: 30.0000 g | Freq: Once | ORAL | Status: DC

## 2020-08-13 NOTE — Progress Notes (Signed)
ANTICOAGULATION CONSULT NOTE   Pharmacy Consult for Heparin  Indication: atrial fibrillation  No Known Allergies  Patient Measurements: Height: 5\' 8"  (172.7 cm) Weight: 94.3 kg (207 lb 14.3 oz) IBW/kg (Calculated) : 68.4 HEPARIN DW (KG): 92.9   Vital Signs: Temp: 97.8 F (36.6 C) (01/03 0500) Temp Source: Oral (01/03 0500) BP: 118/87 (01/03 0500) Pulse Rate: 84 (01/03 0500)  Labs: Recent Labs    08/11/20 0407 08/11/20 2351 08/12/20 0220 08/12/20 1444 08/12/20 2222 08/13/20 0146  HGB 9.1* 9.8*  --   --   --  10.6*  HCT 30.1* 30.5*  --   --   --  33.9*  PLT 357 344  --   --   --  312  HEPARINUNFRC  --  <0.10*   < > 0.16* 0.32 0.35  CREATININE 1.19 1.21  --   --   --  1.17   < > = values in this interval not displayed.    Estimated Creatinine Clearance: 87 mL/min (by C-G formula based on SCr of 1.17 mg/dL).   Medical History: Past Medical History:  Diagnosis Date  . Allergy   . Anxiety and depression 05/29/2017  . Atrial fibrillation (HCC)   . Diabetes mellitus without complication (HCC)    type 2  . HIV (human immunodeficiency virus infection) (HCC)   . Hyperlipidemia   . Hypertension   . Stroke [facility])     Medications:   . sodium chloride   Intravenous Once  . aspirin EC  81 mg Oral QHS  . atorvastatin  40 mg Oral Daily  . Chlorhexidine Gluconate Cloth  6 each Topical Daily  . dolutegravir  50 mg Oral Daily  . emtricitabine-tenofovir AF  1 tablet Oral QHS  . feeding supplement (GLUCERNA SHAKE)  237 mL Oral TID BM  . insulin aspart  0-5 Units Subcutaneous QHS  . insulin aspart  0-9 Units Subcutaneous TID WC  . insulin glargine  15 Units Subcutaneous Daily  . metoprolol tartrate  100 mg Oral BID  . metroNIDAZOLE  500 mg Oral Q8H  . multivitamin with minerals  1 tablet Oral Daily  . nutrition supplement (JUVEN)  1 packet Oral BID BM  . sertraline  100 mg Oral BID  . sodium chloride flush  5 mL Intracatheter Q8H  . sodium polystyrene  30 g Oral Once   . sodium polystyrene  30 g Oral Once    Assessment: 50 yo male with afib (CHADSVASC= 5). Pharmacy consulted to dose heparin. He is also noted with necrotizing fasciitis of the penis and perineum and plans reconstruction after transfer to [facility].  Also s/p operative debridement 12/22, 12/24 and 12/26.  Heparin level continues to be at goal (0.3), no bleeding issues noted. CBC stable overnight.   Goal of Therapy:  Heparin level 0.3-0.7 units/ml Monitor platelets by anticoagulation protocol: Yes   Plan:  -Continue heparin 1900 units/hr -Daily heparin level and cbc  1/27 PharmD., BCPS Clinical Pharmacist 08/13/2020 12:07 PM

## 2020-08-13 NOTE — Plan of Care (Signed)
  Problem: Nutrition: Goal: Adequate nutrition will be maintained Outcome: Progressing   Problem: Coping: Goal: Level of anxiety will decrease Outcome: Progressing   Problem: Pain Managment: Goal: General experience of comfort will improve Outcome: Progressing   Problem: Respiratory: Goal: Ability to maintain adequate ventilation will improve Outcome: Progressing

## 2020-08-13 NOTE — Progress Notes (Signed)
PROGRESS NOTE    Francisco Hutchinson  [id number] DOB: Jan 21, 1971 DOA: 08/01/2020 PCP: [name], MD   Brief Narrative: The patient is a 50 year old nursing home resident with a past medical history significant for but not limited to hypertension, HIV, CVA, diabetes mellitus type 2, anxiety and depression, as well as other comorbidities who presented to the emergency room with septic shock due to Fournier's gangrene neurology is consulted and patient was taken urgently to the OR for incision and drainage.  He was then taken back to the OR on 08/05/2020 for a washout and debridement of the penile and perineal abscess at the patient has had necrotizing fasciitis of the penis and perineum.  He was transferred to [facility] service 08/05/2020.  Wound culture wound 40 is in blood cultures have been negative.  MRSA PCR was negative and Covid was negative as well as flu.  He was started on IV Zosyn and changed to IV Ceftriaxone.  He underwent a suprapubic catheter placement on 08/06/2020 by interventional radiology  On 08/08/2020 his blood count had dropped to 7.1 so he be typed and screened and transfused 1 unit of PRBCs as his blood count has been slowly trending down.  Repeat blood count 08/09/2020 is now 8.4/26.6 and stable.  Renal function is improving but he was little hyperkalemic.  He is given bolus of Lokelma.  He was found to be in new onset atrial fibrillation with RVR and was given metoprolol push.  Cardiology yesterday given his new A. fib with RVR  Cardiology evaluated for his proximal atrial fibrillation with RVR and because this occurred in the setting of Fournier's gangrene they checked a TSH for completeness and recommended continue metoprolol 100 mg p.o. twice daily for rate control and also added Cardizem 30 mg every 6 hours and recommended possibly changing to Cardizem CD 120 mg today if heart rate and blood pressure remained stable.  His CHA2DS2-VASc score was 5 and so they recommended  starting anticoagulation but only when it is okay with urology I spoke with Dr. Milford Cage of urology who okayed the heparin drip for now and when his urological procedures are improved and they are recommending Xarelto p.o.  Currently have started the transfer process to [facility] for reconstructive surgery to the care of Dr. Francesca Jewett per urology's recommendations but they are requesting urology to urology discussion after Monday and I spoke with Dr. Milford Cage and Dr. McDiarmid will call Dr. Francesca Jewett when he is back next week.  08/12/2020: He on a heparin drip yesterday and he is tolerating this well.  His lab values are improving and his potassium is slightly improved from yesterday but we will give him on low dose of Kayexalate.  His WBC is now resolved and renal function remains relatively stable.  We will discuss by urology about transferring the patient to [facility] this week.  08/13/2020: He remains on a heparin drip and has no overt signs and symptoms of bleeding.  Heart rate is much better controlled today and cardiology recommending continuing metoprolol tartrate 100 O2 twice daily as well as digoxin 0.125 mg p.o. daily.  He will remain on the heparin drip until after all his urological procedures are done and then he can be transitioned to Xarelto.  He will need to be transferred to [facility] for urethral reconstructive surgery but Dr. Matilde Sprang will need to speak with Dr. Francesca Jewett for this to happen and then once they have okayed this transfer process in order we can call the hospitalist  there for acceptance.  I tried last Friday and they told us to call back after urology has discussed the case with Dr. Francesca Jewett.  Assessment & Plan:   Active Problems:   Fourniers gangrene   Penile abscess   Pressure injury of skin   Hypotension   Tachycardia   Fournier's Gangrene and necrotizing fasciitis of the penis and perineum  -Presented with septic shock and is admitted to [facility] and and transferred to [facility] service  08/05/2020 -He is status post debridement twice by Urology.  Culture growing Proteus so will continue IV Zosyn but chjanged to IV Ceftriaxone and also added Flagyl; need to continue monitor carefully and if necessary will transition back to IV Zosyn -Patient scheduled to have suprapubic catheter placed by interventional radiology and this was done 08/06/20 -Patient afebrile, WBC was trending down from Admission (27.5) and has now stabilized and has now gone from 14.4 -> 14.5 -> 13.8 -> 12.6 slightly worse at 14.4 -Urology recommending changing the dressings wet-to-dry daily as his granulation tissue looks fairly well.  Urology recommending transferred to [facility] later this week to the care of Dr. Francesca Jewett for reconstruction and process has been initiated and per my discussion with the [facility] transfer coordinator as he is at capacity and recommending checking back next week -Discussed with urology and they are okay with him starting  heparin drip given his history of CVA and CHA2DS2-VASc score of 5 -I spoke with urology at [facility] Dr. Stanford Breed on 08/10/20 who is the fellow there and he feels that they do not have any intermediate beds currently still and they cannot accept the patient at this time.  He recommended calling back after Sunday when his partner Dr. Francesca Jewett is back from vacation and recommended that the urology team call to figure out what exactly the patient will require in terms of reconstruction.  We will reach out to our urology team to have them call after Sunday for transfer for this patient as it likely needs to be a urology to urology consult; Dr. Matilde Sprang still needs to call Dr. Francesca Jewett to discuss the Urethreal Reconstructive Surgery prior to [place] attempting to transfer to [facility].   HIV  -C/w Dolutegravir 50 mg po daily and Emticitabine-Tenofovir 200-25 mg po daily  Hx of CVA -Was on ASA pta and resumed  -C/w Atorvastatin 40 mg po Daily   Uncontrolled Diabetes Mellitus Type 2 -C/w Lantus 15 units  sq daily; patient takes dulaglutide injection 1.5 mg in the skin once a week, Jardiance 10 mg p.o. daily, and Lantus 55 units in the skin subcu at bedtime -C/w Sensitive Novolog SSI AC -CBG's ranging from 108-188 -HbA1c is 8.8 -Continue to Monitor Blood Sugars carefully and adjust insulin as Necessary  Anxiety and Depression -C/w Sertraline 100 mg po BID  Hyponatremia -Improved. Na+ is now 138 -Continue to Monitor and Trend -IV fluid hydration is now stopped -Repeat CMP in the AM   HTN -C/w Metoprolol 100 mg po BID -Currently his hydrochlorothiazide 25 mg p.o. daily currently being held given that he is getting IV fluid hydration -His amlodipine 5 mg p.o. daily also held given his lower blood pressures as his last blood pressure was 118/87 -continue monitor blood pressures per protocol  HLD -C/w Atorvastatin 40 mg p.o. daily; patient no longer taking fenofibrate   AKI on CKD Stage 3a -Patient's BUN/Cr is remarkably improved and is now stable at 21/1.17 -IV fluid hydration is now stopped -Avoid Nephrotoxic Medications, Contrast Dyes, Hypotension and Renally adjust  Medications -Continue to Monitor and Trend Renal Fxn and Repeat CMP in the AM   Hyperkalemia -Patient had a mildly elevated potassium at 5.6 today -Patient has been refusing his Lokelma so we will reorder another Kayexalate dosing 30 g -Continue monitor trend and repeat CMP in a.m.  Normocytic Anemia/Anemia of Chronic Disease -Patient's Hgb/Hct is now 10.6/33.9 and stable  -Patient is status post 2 units of PRBCs -Continue to Monitor for S/Sx of Bleeding now that we will start him on heparin drip per urology's clearance and because he is on daily aspirin; Currently no overt bleeding noted -Repeat CBC in the AM  New Onset A. fib with RVR, improved is now better rate controlled -Patient had elevated heart rates once again this morning -EKG done in confirmed A. Fib -Echo done recently showed an EF of 40 to  45% -Avoiding Cardizem given his low EF cardiology wants to trial p.o. Cardizem and started him on 30 every 6 scheduled; cardiology starting the patient on digoxin 0.125 mg p.o. daily and recommended anticoagulation so I spoke with urology and will start the patient on a heparin drip as they have given the okay and clearance for it;  -Continue with metoprolol tartrate 100 g p.o. twice daily and given 5 mg of IV metoprolol push -Cardiology was consulted yesterday and they are recommending trialing Cardizem p.o. in addition to metoprolol; now Cardizem has been discontinued and he is on digoxin 0.125 mg p.o. daily as well as metoprolol tartrate 100 mg p.o. twice daily -HR has been better controlled  -Patient's CHA2DS2-VASc score is at least 5 for CHF, hypertension, diabetes, and CVA -Anticoagulation is to begin with a heparin drip -Heart rate is improved and is in the low 100s -Troponin was obtained and first was 13 and next one was 17 -Continue monitor on telemetry -He will need anticoagulation given elevated CHA2DS2-VASc score but cardiology recommending holding off p.o. anticoagulation until his urological procedures are done and recommending Xarelto p.o.  In the interim we will start the patient on heparin drip as Dr. Milford Cage has okayed anticoagulation at this time; he will need to be transferred to [facility] once bed is available and after urology has discussed the case with these other.  Dr. Matilde Sprang advised when we should initiate the transfer process given that he still needs to speak with Dr. Francesca Jewett  Pressure Injuries, poA Pressure Injury 03/24/17 Stage II -  Partial thickness loss of dermis presenting as a shallow open ulcer with a red, pink wound bed without slough. (Active)  03/24/17 1131  Location: Elbow  Location Orientation: Right  Staging: Stage II -  Partial thickness loss of dermis presenting as a shallow open ulcer with a red, pink wound bed without slough.  Wound Description  (Comments):   Present on Admission: Yes     Pressure Injury 08/05/20 Hip Anterior;Left Stage 4 - Full thickness tissue loss with exposed bone, tendon or muscle. (Active)  08/05/20 2000  Location: Hip  Location Orientation: Anterior;Left  Staging: Stage 4 - Full thickness tissue loss with exposed bone, tendon or muscle.  Wound Description (Comments):   Present on Admission: Yes     Pressure Injury 08/05/20 Heel Left Deep Tissue Pressure Injury - Purple or maroon localized area of discolored intact skin or blood-filled blister due to damage of underlying soft tissue from pressure and/or shear. (Active)  08/05/20 2000  Location: Heel  Location Orientation: Left  Staging: Deep Tissue Pressure Injury - Purple or maroon localized area of discolored intact  skin or blood-filled blister due to damage of underlying soft tissue from pressure and/or shear.  Wound Description (Comments):   Present on Admission: Yes     Pressure Injury 08/07/20 Sacrum Mid;Lower Stage 2 -  Partial thickness loss of dermis presenting as a shallow open injury with a red, pink wound bed without slough. (Active)  08/07/20 0800  Location: Sacrum  Location Orientation: Mid;Lower  Staging: Stage 2 -  Partial thickness loss of dermis presenting as a shallow open injury with a red, pink wound bed without slough.  Wound Description (Comments):   Present on Admission:    Obesity -Complicates overall prognosis and care -Estimated body mass index is 31.61 kg/m as calculated from the following:   Height as of this encounter: 5\' 8"  (1.727 m).   Weight as of this encounter: 94.3 kg. -Weight Loss and Dietary Counseling given  DVT prophylaxis: SCDs; now we will start the patient on a heparin drip Code Status: FULL CODE  Family Communication: No family present at bedside  Disposition Plan: Likely Transfer [facility] later this week per Urology's recommendations once they have spoken with their urologist at [facility]  Process was initiated  on Friday however they told us that they had no beds and they canceled this transfer request.  We will need to put in a new transfer request only after urology has spoken with their urologist there.  Status is: Inpatient  Remains inpatient appropriate because:Unsafe d/c plan, IV treatments appropriate due to intensity of illness or inability to take PO and Inpatient level of care appropriate due to severity of illness   Dispo: The patient is from: Home              Anticipated d/c is to: SNF              Anticipated d/c date is: > 3 days              Patient currently is not medically stable to d/c.  Consultants:   PCCM Transfer  Urology  Interventional Radiology    Procedures:  Procedure(s): 1.  Washout and debridement of penile and perineal abscess  Antimicrobials:  Anti-infectives (From admission, onward)   Start     Dose/Rate Route Frequency Ordered Stop   08/09/20 1400  metroNIDAZOLE (FLAGYL) tablet 500 mg        500 mg Oral Every 8 hours 08/09/20 0956     08/06/20 1530  cefTRIAXone (ROCEPHIN) 2 g in sodium chloride 0.9 % 100 mL IVPB        2 g 200 mL/hr over 30 Minutes Intravenous Every 24 hours 08/06/20 1432     08/04/20 2200  emtricitabine-tenofovir AF (DESCOVY) 200-25 MG per tablet 1 tablet        1 tablet Oral Daily at bedtime 08/04/20 0926     08/04/20 1015  dolutegravir (TIVICAY) tablet 50 mg        50 mg Oral Daily 08/04/20 0926     08/03/20 1800  piperacillin-tazobactam (ZOSYN) IVPB 3.375 g  Status:  Discontinued        3.375 g 12.5 mL/hr over 240 Minutes Intravenous Every 8 hours 08/03/20 1719 08/06/20 1432   08/02/20 1000  vancomycin (VANCOCIN) IVPB 1000 mg/200 mL premix  Status:  Discontinued        1,000 mg 200 mL/hr over 60 Minutes Intravenous Every 12 hours 08/02/20 0354 08/04/20 0924   08/02/20 0600  ceFEPIme (MAXIPIME) 2 g in sodium chloride 0.9 % 100 mL IVPB  Status:  Discontinued        2 g 200 mL/hr over 30 Minutes Intravenous Every 8 hours  08/01/20 2148 08/02/20 0345   08/02/20 0600  piperacillin-tazobactam (ZOSYN) IVPB 3.375 g  Status:  Discontinued        3.375 g 12.5 mL/hr over 240 Minutes Intravenous Every 8 hours 08/02/20 0328 08/03/20 1719   08/01/20 2230  vancomycin (VANCOCIN) IVPB 1000 mg/200 mL premix        1,000 mg 200 mL/hr over 60 Minutes Intravenous  Once 08/01/20 2225 08/02/20 0028   08/01/20 2230  clindamycin (CLEOCIN) IVPB 600 mg        600 mg 100 mL/hr over 30 Minutes Intravenous  Once 08/01/20 2225 08/01/20 2320   08/01/20 2045  ceFEPIme (MAXIPIME) 2 g in sodium chloride 0.9 % 100 mL IVPB        2 g 200 mL/hr over 30 Minutes Intravenous  Once 08/01/20 2041 08/01/20 2249       Subjective: Seen and examined at bedside and he appears comfortable laying in the bed.  He is playing on his phone and had no complaints.  No chest pain, shortness breath, lightheadedness or dizziness.  Felt well.  No other concerns or complaints at this time and wants urology as discussed with [facility] urologist we will initiate the transfer process.  Objective: Vitals:   08/12/20 1612 08/12/20 2100 08/12/20 2348 08/13/20 0500  BP: (!) 135/110 (!) 145/113 (!) 142/104 118/87  Pulse: 83 89 94 84  Resp: 18 18  18   Temp: 98.2 F (36.8 C) 98.6 F (37 C) 98.1 F (36.7 C) 97.8 F (36.6 C)  TempSrc: Oral Oral Oral Oral  SpO2: 96% 100%  98%  Weight:      Height:        Intake/Output Summary (Last 24 hours) at 08/13/2020 1358 Last data filed at 08/12/2020 2359 Gross per 24 hour  Intake 738.77 ml  Output 800 ml  Net -61.23 ml   Filed Weights   08/08/20 0641 08/10/20 0425 08/11/20 0411  Weight: 94.9 kg 93.9 kg 94.3 kg   Examination: Physical Exam:  Constitutional: WN/WD chronically ill-appearing Caucasian male currently no acute distress appears calm he is resting Eyes: Lids and conjunctivae normal, sclerae anicteric  ENMT: External Ears, Nose appear normal. Grossly normal hearing.  Neck: Appears normal, supple, no cervical  masses, normal ROM, no appreciable thyromegaly; no JVD Respiratory: Diminished to auscultation bilaterally with coarse breath sounds, no wheezing, rales, rhonchi or crackles. Normal respiratory effort and patient is not tachypenic. No accessory muscle use.  Unlabored breathing Cardiovascular: Irregularly irregular but is rate controlled., no murmurs / rubs / gallops. S1 and S2 auscultated.  No appreciable extremity edema Abdomen: Soft, non-tender, distended secondary body habitus. Bowel sounds positive.  GU: Deferred.  He has a Foley catheter in place and I did not view his surgical wound and he also has a right PCN Musculoskeletal: No clubbing / cyanosis of digits/nails. No joint deformity upper and lower extremities.  Skin: No rashes, lesions, ulcers on limited skin evaluation but does have a left hip lesion. No induration; Warm and dry.  Neurologic: CN 2-12 grossly intact with no focal deficits. Romberg sign and cerebellar reflexes not assessed.  Psychiatric: Normal judgment and insight. Alert and oriented x 3. Normal mood and appropriate affect.   Data Reviewed: I have personally reviewed following labs and imaging studies  CBC: Recent Labs  Lab 08/09/20 0142 08/10/20 0122 08/11/20 0407 08/11/20 2351 08/13/20 [id number]  WBC 12.6* 14.4* 10.3 9.5 10.0  NEUTROABS 10.0* 12.3* 8.3* 7.3 8.1*  HGB 8.4* 8.8* 9.1* 9.8* 10.6*  HCT 26.6* 27.8* 30.1* 30.5* 33.9*  MCV 89.6 90.8 92.9 91.3 93.1  PLT 364 318 357 344 [id number]   Basic Metabolic Panel: Recent Labs  Lab 08/09/20 0142 08/10/20 0122 08/11/20 0407 08/11/20 2351 08/13/20 0146  NA 140 142 141 142 138  K 5.5* 5.5* 5.6* 5.4* 5.6*  CL 112* 112* 112* 115* 114*  CO2 22 22 21* 21* 18*  GLUCOSE 198* 193* 163* 98 162*  BUN 28* 24* 24* 22* 21*  CREATININE 1.48* 1.33* 1.19 1.21 1.17  CALCIUM 7.2* 7.7* 7.9* 7.8* 7.9*  MG 2.5* 2.1 2.2 2.2 2.1  PHOS 3.5 3.2 3.4 3.3 3.8   GFR: Estimated Creatinine Clearance: 87 mL/min (by C-G formula based on SCr  of 1.17 mg/dL). Liver Function Tests: Recent Labs  Lab 08/09/20 0142 08/10/20 0122 08/11/20 0407 08/11/20 2351 08/13/20 0146  AST 23 20 19 15 17   ALT 18 19 17 15 15   ALKPHOS 81 87 92 82 95  BILITOT 0.2* 0.4 0.3 0.4 0.7  PROT 6.0* 6.0* 6.0* 6.0* 6.2*  ALBUMIN 1.5* 1.6* 1.6* 1.7* 1.7*   No results for input(s): LIPASE, AMYLASE in the last 168 hours. No results for input(s): AMMONIA in the last 168 hours. Coagulation Profile: No results for input(s): INR, PROTIME in the last 168 hours. Cardiac Enzymes: No results for input(s): CKTOTAL, CKMB, CKMBINDEX, TROPONINI in the last 168 hours. BNP (last 3 results) No results for input(s): PROBNP in the last 8760 hours. HbA1C: No results for input(s): HGBA1C in the last 72 hours. CBG: Recent Labs  Lab 08/12/20 1149 08/12/20 1610 08/12/20 2203 08/13/20 0746 08/13/20 1108  GLUCAP 153* 188* 123* 108* 114*   Lipid Profile: No results for input(s): CHOL, HDL, LDLCALC, TRIG, CHOLHDL, LDLDIRECT in the last 72 hours. Thyroid Function Tests: No results for input(s): TSH, T4TOTAL, FREET4, T3FREE, THYROIDAB in the last 72 hours. Anemia Panel: No results for input(s): VITAMINB12, FOLATE, FERRITIN, TIBC, IRON, RETICCTPCT in the last 72 hours. Sepsis Labs: No results for input(s): PROCALCITON, LATICACIDVEN in the last 168 hours.  Recent Results (from the past 240 hour(s))  Surgical pcr screen     Status: None   Collection Time: 08/05/20  5:08 AM   Specimen: Nasal Mucosa; Nasal Swab  Result Value Ref Range Status   MRSA, PCR NEGATIVE NEGATIVE Final   Staphylococcus aureus NEGATIVE NEGATIVE Final    Comment: (NOTE) The Xpert SA Assay (FDA approved for NASAL specimens in patients 51 years of age and older), is one component of a comprehensive surveillance program. It is not intended to diagnose infection nor to guide or monitor treatment. Performed at [facility], [place] [address], [place], [place] [address]      RN Pressure  Injury Documentation: Pressure Injury 03/24/17 Stage II -  Partial thickness loss of dermis presenting as a shallow open ulcer with a red, pink wound bed without slough. (Active)  03/24/17 1131  Location: Elbow  Location Orientation: Right  Staging: Stage II -  Partial thickness loss of dermis presenting as a shallow open ulcer with a red, pink wound bed without slough.  Wound Description (Comments):   Present on Admission: Yes     Pressure Injury 08/05/20 Hip Anterior;Left Stage 4 - Full thickness tissue loss with exposed bone, tendon or muscle. (Active)  08/05/20 2000  Location: Hip  Location Orientation: Anterior;Left  Staging: Stage 4 -  Full thickness tissue loss with exposed bone, tendon or muscle.  Wound Description (Comments):   Present on Admission: Yes     Pressure Injury 08/05/20 Heel Left Deep Tissue Pressure Injury - Purple or maroon localized area of discolored intact skin or blood-filled blister due to damage of underlying soft tissue from pressure and/or shear. (Active)  08/05/20 2000  Location: Heel  Location Orientation: Left  Staging: Deep Tissue Pressure Injury - Purple or maroon localized area of discolored intact skin or blood-filled blister due to damage of underlying soft tissue from pressure and/or shear.  Wound Description (Comments):   Present on Admission: Yes     Pressure Injury 08/07/20 Sacrum Mid;Lower Stage 2 -  Partial thickness loss of dermis presenting as a shallow open injury with a red, pink wound bed without slough. (Active)  08/07/20 0800  Location: Sacrum  Location Orientation: Mid;Lower  Staging: Stage 2 -  Partial thickness loss of dermis presenting as a shallow open injury with a red, pink wound bed without slough.  Wound Description (Comments):   Present on Admission:    Estimated body mass index is 31.61 kg/m as calculated from the following:   Height as of this encounter: 5\' 8"  (1.727 m).   Weight as of this encounter: 94.3  kg.  Malnutrition Type:  Nutrition Problem: Increased nutrient needs Etiology: wound healing  Malnutrition Characteristics:  Signs/Symptoms: estimated needs  Nutrition Interventions:  Interventions: Ensure Enlive (each supplement provides 350kcal and 20 grams of protein),MVI   Radiology Studies: No results found. Scheduled Meds: . sodium chloride   Intravenous Once  . aspirin EC  81 mg Oral QHS  . atorvastatin  40 mg Oral Daily  . Chlorhexidine Gluconate Cloth  6 each Topical Daily  . dolutegravir  50 mg Oral Daily  . emtricitabine-tenofovir AF  1 tablet Oral QHS  . feeding supplement (GLUCERNA SHAKE)  237 mL Oral TID BM  . insulin aspart  0-5 Units Subcutaneous QHS  . insulin aspart  0-9 Units Subcutaneous TID WC  . insulin glargine  15 Units Subcutaneous Daily  . metoprolol tartrate  100 mg Oral BID  . metroNIDAZOLE  500 mg Oral Q8H  . multivitamin with minerals  1 tablet Oral Daily  . nutrition supplement (JUVEN)  1 packet Oral BID BM  . sertraline  100 mg Oral BID  . sodium chloride flush  5 mL Intracatheter Q8H  . sodium polystyrene  30 g Oral Once  . sodium polystyrene  30 g Oral Once   Continuous Infusions: . cefTRIAXone (ROCEPHIN)  IV 2 g (08/13/20 0912)  . heparin 1,900 Units/hr (08/13/20 0216)    LOS: 11 days   10/11/20, DO [facility] PAGER is on AMION  If 7PM-7AM, please contact night-coverage [email or web address]

## 2020-08-13 NOTE — Progress Notes (Signed)
Chart reviewed  Wounds look great Continue with wound care [facility] hopes to tell us tomorrow (spoke with them today) if they can take on Wednesday pending bed availability Agree with heparin drip and no xarelto until after [facility] surgery

## 2020-08-13 NOTE — Progress Notes (Signed)
Progress Note  Patient Name: Francisco Hutchinson Date of Encounter: 08/13/2020  [facility] Cardiologist: Olga Millers, MD   Subjective   Slept comfortably lying fully flat. Unaware of arrhythmia. AFib rate control is now good (70s). Elevated DBP, improving.  Inpatient Medications    Scheduled Meds: . sodium chloride   Intravenous Once  . aspirin EC  81 mg Oral QHS  . atorvastatin  40 mg Oral Daily  . Chlorhexidine Gluconate Cloth  6 each Topical Daily  . digoxin  0.125 mg Oral Daily  . dolutegravir  50 mg Oral Daily  . emtricitabine-tenofovir AF  1 tablet Oral QHS  . feeding supplement (GLUCERNA SHAKE)  237 mL Oral TID BM  . insulin aspart  0-5 Units Subcutaneous QHS  . insulin aspart  0-9 Units Subcutaneous TID WC  . insulin glargine  15 Units Subcutaneous Daily  . metoprolol tartrate  100 mg Oral BID  . metroNIDAZOLE  500 mg Oral Q8H  . multivitamin with minerals  1 tablet Oral Daily  . nutrition supplement (JUVEN)  1 packet Oral BID BM  . sertraline  100 mg Oral BID  . sodium chloride flush  5 mL Intracatheter Q8H  . sodium polystyrene  30 g Oral Once   Continuous Infusions: . cefTRIAXone (ROCEPHIN)  IV 2 g (08/12/20 0941)  . heparin 1,900 Units/hr (08/13/20 0216)   PRN Meds: docusate sodium, HYDROmorphone, ondansetron (ZOFRAN) IV, polyethylene glycol   Vital Signs    Vitals:   08/12/20 1612 08/12/20 2100 08/12/20 2348 08/13/20 0500  BP: (!) 135/110 (!) 145/113 (!) 142/104 118/87  Pulse: 83 89 94 84  Resp: 18 18  18   Temp: 98.2 F (36.8 C) 98.6 F (37 C) 98.1 F (36.7 C) 97.8 F (36.6 C)  TempSrc: Oral Oral Oral Oral  SpO2: 96% 100%  98%  Weight:      Height:        Intake/Output Summary (Last 24 hours) at 08/13/2020 0740 Last data filed at 08/12/2020 2359 Gross per 24 hour  Intake 738.77 ml  Output 1450 ml  Net -711.23 ml   Last 3 Weights 08/11/2020 08/10/2020 08/08/2020  Weight (lbs) 207 lb 14.3 oz 207 lb 0.2 oz 209 lb 3.5 oz  Weight (kg)  94.3 kg 93.9 kg 94.9 kg      Telemetry    AF rate controlled - Personally Reviewed  ECG    AFib, low voltage - Personally Reviewed  Physical Exam  Appears chronically ill GEN: No acute distress.   Neck: No JVD Cardiac: irregular, no murmurs, rubs, or gallops.  Respiratory: Clear to auscultation bilaterally. GI: Soft, nontender, non-distended  MS: No edema; No deformity. Neuro:  Nonfocal  Psych: Normal affect   Labs    High Sensitivity Troponin:   Recent Labs  Lab 08/09/20 1105 08/09/20 1220  TROPONINIHS 13 17      Chemistry Recent Labs  Lab 08/11/20 0407 08/11/20 2351 08/13/20 0146  NA 141 142 138  K 5.6* 5.4* 5.6*  CL 112* 115* 114*  CO2 21* 21* 18*  GLUCOSE 163* 98 162*  BUN 24* 22* 21*  CREATININE 1.19 1.21 1.17  CALCIUM 7.9* 7.8* 7.9*  PROT 6.0* 6.0* 6.2*  ALBUMIN 1.6* 1.7* 1.7*  AST 19 15 17   ALT 17 15 15   ALKPHOS 92 82 95  BILITOT 0.3 0.4 0.7  GFRNONAA >60 >60 >60  ANIONGAP 8 6 6      Hematology Recent Labs  Lab 08/11/20 0407 08/11/20 2351 08/13/20 0146  WBC 10.3 9.5 10.0  RBC 3.24* 3.34* 3.64*  HGB 9.1* 9.8* 10.6*  HCT 30.1* 30.5* 33.9*  MCV 92.9 91.3 93.1  MCH 28.1 29.3 29.1  MCHC 30.2 32.1 31.3  RDW 18.1* 18.1* 18.3*  PLT 357 344 312    BNPNo results for input(s): BNP, PROBNP in the last 168 hours.   DDimer No results for input(s): DDIMER in the last 168 hours.   Radiology    No results found.  Cardiac Studies   Echocardiogram 08/04/2020: Impressions: 1. Left ventricular ejection fraction, by estimation, is 40 to 45%. The  left ventricle has mildly decreased function. The left ventricle  demonstrates global hypokinesis. Left ventricular diastolic function could  not be evaluated.  2. Right ventricular systolic function is mildly reduced. The right  ventricular size is mildly enlarged. There is mildly elevated pulmonary  artery systolic pressure. The estimated right ventricular systolic  pressure is 123XX123 mmHg.  3.  Left atrial size was mildly dilated.  4. The mitral valve is grossly normal. Trivial mitral valve  regurgitation. No evidence of mitral stenosis.  5. The aortic valve is tricuspid. Aortic valve regurgitation is not  visualized. No aortic stenosis is present.  6. The inferior vena cava is dilated in size with <50% respiratory  variability, suggesting right atrial pressure of 15 mmHg.   Patient Profile     50 y.o. male with a history of CVA, hypertension, diabetes mellitus, HIV, and Fournier's gangrene who was admitted on 12/23/2021for Fournier's gangrene. He underwent re-exploration and debridement of penis and groin on 08/03/2020 and then irrigation and debridement of penis and perineum. Cardiology was consulted on 08/09/2020 for further evaluation of atrial fibrillation.   Assessment & Plan    Persistent Atrial Fibrillation - well rate controlled - Echo showed LVEF of 40-45% with global hypokinesis. RV mildly enlarged with mildly reduced systolic function and mildly elevated PASP.  - Continue Lopressor 100mg  twice daily. - Continue Digoxin 0.125mg  daily.  - On IV Heparin for now in anticipation of additional urological procedures. Patient may be transferred to [facility] for reconstructive surgery.  Dilated Cardiomyopathy - EF mildly reduced at 40-45%.  - Continue beta-blocker. Consider transitioning to Toprol-XL prior to discharge. - Hold off on adding ARB at this time given hyperkalemia (potassium 5.6 today). - Continue to monitor volume status closely. - Repeat Echo was recommended once all procedure are complete and he recovers from present illness. Not clear that he would be a good candidate for aggressive ischemic evaluation given comorbidities.   Hypertension - BP mildly elevated yesterday but well controlled this morning.  - Continue Lopressor as above. - Can restart home Amlodipine 5mg  daily if needed.  Hyperlipidemia - Continue home Lipitor 40mg   daily.  Hyperkalemia - Potassium mildly elevated between 5.4 to 5.6 over the last several days. 5.6 today. - He has received 2 doses of Kayexalate and 1 dose of Lokelma. - May need additional dose of Lokelma.   AKI - Creatinine 1.51 on admission and then peaked at 2.42 on 12/27. Most recent baseline around 1.5 to 1.7 - Resolved. Creatinine 1.17 today.  HIV - Management per primary team.  Fournier's Gangrene - S/p re-exploration and debridement of penis and groin on 08/03/2020 and then irrigation and debridement of penis and perineum.  - Management per Urology. Patient may be [facility] for [facility]ive surgery.     For questions or updates, please contact [place] Please consult [email or web address] for contact info under  Signed, Darreld Mclean, PA-C  08/13/2020, 7:40 AM     I have seen and examined the patient along with Darreld Mclean, PA-C .  I have reviewed the chart, notes and new data.  I agree with PA/NP's note.  Key new complaints: no cardiovascular symptoms Key examination changes: AF rate is now well controlled. R BKA. Key new findings / data: note almost complete absence of arterial calcifications on multiple imaging studies. Persistent hyperkalemia despite improved renal function. Now also mild metabolic acidosis, suggesting RTA type IV, but on admission was hypokalemic and bicarb was low normal.  PLAN: AF rate control is now excellent. Stop digoxin. Continue metoprolol 100 mg twice daily. Anticoagulation with oral agents (and consequently consideration of cardioversion) on hold due to probable need for further surgery. Long term will need DOAC. CHADSVasc 6 (CVA, DM, PAD, low EF, HTN). Etiology of LV dysfunction not yet defined. Could have CAD despite young age due to DM/PAD, antiretroviral therapy, but note absence of atherosclerosis in abdominal and pelvic arteries. Amputation was related to distal small vessel "diabetic" disease.  No angina. On ASA and statin. Not currently a candidate for invasive coronary evaluation, but this should be considered if LVEF does not recover. Holding off RAAS inhibitors due to persistent hyperkalemia.  Sanda Klein, MD, [place] [phone] 08/13/2020, 9:28 AM

## 2020-08-14 DIAGNOSIS — E875 Hyperkalemia: Secondary | ICD-10-CM

## 2020-08-14 DIAGNOSIS — N493 Fournier gangrene: Secondary | ICD-10-CM | POA: Diagnosis not present

## 2020-08-14 DIAGNOSIS — N4821 Abscess of corpus cavernosum and penis: Secondary | ICD-10-CM | POA: Diagnosis not present

## 2020-08-14 DIAGNOSIS — I10 Essential (primary) hypertension: Secondary | ICD-10-CM | POA: Diagnosis not present

## 2020-08-14 DIAGNOSIS — I4891 Unspecified atrial fibrillation: Secondary | ICD-10-CM | POA: Diagnosis not present

## 2020-08-14 DIAGNOSIS — I4819 Other persistent atrial fibrillation: Secondary | ICD-10-CM | POA: Diagnosis not present

## 2020-08-14 DIAGNOSIS — R000 Tachycardia, unspecified: Secondary | ICD-10-CM | POA: Diagnosis not present

## 2020-08-14 DIAGNOSIS — I420 Dilated cardiomyopathy: Secondary | ICD-10-CM | POA: Diagnosis not present

## 2020-08-14 DIAGNOSIS — N179 Acute kidney failure, unspecified: Secondary | ICD-10-CM

## 2020-08-14 LAB — CBC WITH DIFFERENTIAL/PLATELET
Abs Immature Granulocytes: 0.06 10*3/uL (ref 0.00–0.07)
Basophils Absolute: 0.1 10*3/uL (ref 0.0–0.1)
Basophils Relative: 1 %
Eosinophils Absolute: 0.1 10*3/uL (ref 0.0–0.5)
Eosinophils Relative: 1 %
HCT: 30.9 % — ABNORMAL LOW (ref 39.0–52.0)
Hemoglobin: 9.1 g/dL — ABNORMAL LOW (ref 13.0–17.0)
Immature Granulocytes: 1 %
Lymphocytes Relative: 16 %
Lymphs Abs: 1.5 10*3/uL (ref 0.7–4.0)
MCH: 28.3 pg (ref 26.0–34.0)
MCHC: 29.4 g/dL — ABNORMAL LOW (ref 30.0–36.0)
MCV: 96 fL (ref 80.0–100.0)
Monocytes Absolute: 0.4 10*3/uL (ref 0.1–1.0)
Monocytes Relative: 5 %
Neutro Abs: 7.1 10*3/uL (ref 1.7–7.7)
Neutrophils Relative %: 76 %
Platelets: 290 10*3/uL (ref 150–400)
RBC: 3.22 MIL/uL — ABNORMAL LOW (ref 4.22–5.81)
RDW: 18.6 % — ABNORMAL HIGH (ref 11.5–15.5)
WBC: 9.3 10*3/uL (ref 4.0–10.5)
nRBC: 0 % (ref 0.0–0.2)

## 2020-08-14 LAB — COMPREHENSIVE METABOLIC PANEL
ALT: 15 U/L (ref 0–44)
AST: 20 U/L (ref 15–41)
Albumin: 1.6 g/dL — ABNORMAL LOW (ref 3.5–5.0)
Alkaline Phosphatase: 86 U/L (ref 38–126)
Anion gap: 5 (ref 5–15)
BUN: 22 mg/dL — ABNORMAL HIGH (ref 6–20)
CO2: 23 mmol/L (ref 22–32)
Calcium: 7.8 mg/dL — ABNORMAL LOW (ref 8.9–10.3)
Chloride: 113 mmol/L — ABNORMAL HIGH (ref 98–111)
Creatinine, Ser: 1.28 mg/dL — ABNORMAL HIGH (ref 0.61–1.24)
GFR, Estimated: >60 mL/min (ref >60)
Glucose, Bld: 233 mg/dL — ABNORMAL HIGH (ref 70–99)
Potassium: 5.8 mmol/L — ABNORMAL HIGH (ref 3.5–5.1)
Sodium: 141 mmol/L (ref 135–145)
Total Bilirubin: 0.5 mg/dL (ref 0.3–1.2)
Total Protein: 5.7 g/dL — ABNORMAL LOW (ref 6.5–8.1)

## 2020-08-14 LAB — PHOSPHORUS: Phosphorus: 4.1 mg/dL (ref 2.5–4.6)

## 2020-08-14 LAB — GLUCOSE, CAPILLARY
Glucose-Capillary: 197 mg/dL — ABNORMAL HIGH (ref 70–99)
Glucose-Capillary: 214 mg/dL — ABNORMAL HIGH (ref 70–99)
Glucose-Capillary: 141 mg/dL — ABNORMAL HIGH (ref 70–99)
Glucose-Capillary: 133 mg/dL — ABNORMAL HIGH (ref 70–99)

## 2020-08-14 LAB — HEPARIN LEVEL (UNFRACTIONATED)
Heparin Unfractionated: 1.88 IU/mL — ABNORMAL HIGH (ref 0.30–0.70)
Heparin Unfractionated: 0.57 IU/mL (ref 0.30–0.70)

## 2020-08-14 LAB — MAGNESIUM: Magnesium: 2.2 mg/dL (ref 1.7–2.4)

## 2020-08-14 MED ORDER — SODIUM CHLORIDE 0.9 % IV BOLUS
500.0000 mL | Freq: Once | INTRAVENOUS | Status: AC
  Administered 2020-08-14: 500 mL via INTRAVENOUS

## 2020-08-14 MED ORDER — CALCIUM GLUCONATE-NACL 1-0.675 GM/50ML-% IV SOLN
1.0000 g | Freq: Once | INTRAVENOUS | Status: AC
  Administered 2020-08-14: 1000 mg via INTRAVENOUS
  Filled 2020-08-14: qty 50

## 2020-08-14 MED ORDER — SODIUM POLYSTYRENE SULFONATE 15 GM/60ML PO SUSP
15.0000 g | Freq: Once | ORAL | Status: AC
  Administered 2020-08-14: 15 g via ORAL
  Filled 2020-08-14: qty 60

## 2020-08-14 NOTE — Progress Notes (Addendum)
Progress Note  Patient Name: Francisco Hutchinson Date of Encounter: 08/14/2020  [facility] Cardiologist: Olga Millers, MD   Subjective   Sleepy, rouses easily to verbal, no new complaints  Inpatient Medications    Scheduled Meds: . sodium chloride   Intravenous Once  . aspirin EC  81 mg Oral QHS  . atorvastatin  40 mg Oral Daily  . Chlorhexidine Gluconate Cloth  6 each Topical Daily  . dolutegravir  50 mg Oral Daily  . emtricitabine-tenofovir AF  1 tablet Oral QHS  . feeding supplement (GLUCERNA SHAKE)  237 mL Oral TID BM  . insulin aspart  0-5 Units Subcutaneous QHS  . insulin aspart  0-9 Units Subcutaneous TID WC  . insulin glargine  15 Units Subcutaneous Daily  . metoprolol tartrate  100 mg Oral BID  . metroNIDAZOLE  500 mg Oral Q8H  . multivitamin with minerals  1 tablet Oral Daily  . nutrition supplement (JUVEN)  1 packet Oral BID BM  . sertraline  100 mg Oral BID  . sodium chloride flush  5 mL Intracatheter Q8H  . sodium polystyrene  30 g Oral Once  . sodium polystyrene  30 g Oral Once   Continuous Infusions: . cefTRIAXone (ROCEPHIN)  IV 2 g (08/13/20 0912)  . heparin 1,900 Units/hr (08/14/20 0441)   PRN Meds: docusate sodium, HYDROmorphone, ondansetron (ZOFRAN) IV, polyethylene glycol   Vital Signs    Vitals:   08/13/20 1720 08/13/20 1802 08/13/20 2207 08/14/20 0404  BP: (!) 113/98 (!) 113/98 112/84 136/87  Pulse: 78 73 98 79  Resp: (!) 22 18 18 18   Temp: 98.5 F (36.9 C)  98.3 F (36.8 C) 97.9 F (36.6 C)  TempSrc: Oral  Oral Oral  SpO2:   96% 98%  Weight:    89.3 kg  Height:        Intake/Output Summary (Last 24 hours) at 08/14/2020 0755 Last data filed at 08/14/2020 0534 Gross per 24 hour  Intake 240 ml  Output 1200 ml  Net -960 ml   Last 3 Weights 08/14/2020 08/11/2020 08/10/2020  Weight (lbs) 196 lb 13.9 oz 207 lb 14.3 oz 207 lb 0.2 oz  Weight (kg) 89.3 kg 94.3 kg 93.9 kg      Telemetry    Atrial fib, HR 80s-100s - Personally  Reviewed  ECG    AFib, low voltage - Personally Reviewed  Physical Exam   GEN: slender, chronically-ill appearing male, No acute distress.   Neck: No JVD Cardiac: Irreg R&R, no murmur, no rubs, or gallops.  Respiratory: diminished to auscultation bilaterally  GI: Soft, tender, non-distended  MS: No edema; No deformity. S/p R BKA Neuro:  Nonfocal  Psych: Normal affect   Labs    High Sensitivity Troponin:   Recent Labs  Lab 08/09/20 1105 08/09/20 1220  TROPONINIHS 13 17      Chemistry Recent Labs  Lab 08/11/20 2351 08/13/20 0146 08/14/20 0345  NA 142 138 141  K 5.4* 5.6* 5.8*  CL 115* 114* 113*  CO2 21* 18* 23  GLUCOSE 98 162* 233*  BUN 22* 21* 22*  CREATININE 1.21 1.17 1.28*  CALCIUM 7.8* 7.9* 7.8*  PROT 6.0* 6.2* 5.7*  ALBUMIN 1.7* 1.7* 1.6*  AST 15 17 20   ALT 15 15 15   ALKPHOS 82 95 86  BILITOT 0.4 0.7 0.5  GFRNONAA >60 >60 >60  ANIONGAP 6 6 5      Hematology Recent Labs  Lab 08/11/20 2351 08/13/20 0146 08/14/20 0345  WBC  9.5 10.0 9.3  RBC 3.34* 3.64* 3.22*  HGB 9.8* 10.6* 9.1*  HCT 30.5* 33.9* 30.9*  MCV 91.3 93.1 96.0  MCH 29.3 29.1 28.3  MCHC 32.1 31.3 29.4*  RDW 18.1* 18.3* 18.6*  PLT 344 312 290    BNPNo results for input(s): BNP, PROBNP in the last 168 hours.   DDimer No results for input(s): DDIMER in the last 168 hours.   Radiology    No results found.  Cardiac Studies   Echocardiogram 08/04/2020: Impressions: 1. Left ventricular ejection fraction, by estimation, is 40 to 45%. The  left ventricle has mildly decreased function. The left ventricle  demonstrates global hypokinesis. Left ventricular diastolic function could  not be evaluated.  2. Right ventricular systolic function is mildly reduced. The right  ventricular size is mildly enlarged. There is mildly elevated pulmonary  artery systolic pressure. The estimated right ventricular systolic  pressure is 123XX123 mmHg.  3. Left atrial size was mildly dilated.  4. The  mitral valve is grossly normal. Trivial mitral valve  regurgitation. No evidence of mitral stenosis.  5. The aortic valve is tricuspid. Aortic valve regurgitation is not  visualized. No aortic stenosis is present.  6. The inferior vena cava is dilated in size with <50% respiratory  variability, suggesting right atrial pressure of 15 mmHg.   Patient Profile     50 y.o. male with a history of CVA, hypertension, diabetes mellitus, HIV, and Fournier's gangrene who was admitted on 12/23/2021for Fournier's gangrene. He underwent re-exploration and debridement of penis and groin on 08/03/2020 and then irrigation and debridement of penis and perineum. Cardiology was consulted on 08/09/2020 for further evaluation of atrial fibrillation.   Assessment & Plan    Persistent Atrial Fibrillation - rate control reasonable considering acute illness - EF 40-45% by echo, results above - on Lopressor 100 mg bid - was on dig 0.125 mg qd, d/c'd - continue heparin until no further procedures planned - for tx [facility], ?tomorrow  Dilated Cardiomyopathy - EF 40-45% by echo - on BB, can change to Toprol XL 200 mg qd as no doses missed, but may want to wait until after procedures so he would not miss a whole day of rx if NPO - no ACE/ARB/DRNI due to hyperkalemia - repeat echo in 8-12 weeks, after he has recovered from procedures, then decide on ischemic eval  Hypertension - off home amlodipine - SBP 110s-130s on current rx  Hyperlipidemia - on home dose Lipitor 40 mg qd  Hyperkalemia - K + remains high  - pt has refused Kayexalate after 1 dose on 01/01 - he refused Lokelma as well - per IM  AKI - Admit Cr 1.51 >> 2.42 >> 1.17 on 01/03, 1.28 today - baseline approx 1.5 - follow, per IM  Fournier's Gangrene - s/p multiple surgical procedures here - for tx to [facility], ?tomorrow - per Urology/IM   For questions or updates, please contact [facility] Please consult [email or web address] for contact  info under        Signed, Rosaria Ferries, PA-C  08/14/2020, 7:55 AM    I have seen and examined the patient along with Rosaria Ferries, PA-C.  I have reviewed the chart, notes and new data.  I agree with PA's note.  Key new complaints: sleepy, no CV complaints Key examination changes: irregular rhythm , appears chronically ill  Key new findings / data: K 5.8.  PLAN: Persistent hyperkalemia. Stopped digoxin. Rate control is adequate considering anemia and infection.  Maurica Omura,  MD, [facility] [phone] 08/14/2020, 1:46 PM

## 2020-08-14 NOTE — Progress Notes (Signed)
PROGRESS NOTE    Francisco Hutchinson  [id number] DOB: August 17, 1970 DOA: 08/01/2020 PCP: [name], MD   Brief Narrative: The patient is a 50 year old nursing home resident with a past medical history significant for but not limited to hypertension, HIV, CVA, diabetes mellitus type 2, anxiety and depression, as well as other comorbidities who presented to the emergency room with septic shock due to Fournier's gangrene neurology is consulted and patient was taken urgently to the OR for incision and drainage.  He was then taken back to the OR on 08/05/2020 for a washout and debridement of the penile and perineal abscess at the patient has had necrotizing fasciitis of the penis and perineum.  He was transferred to [facility] service 08/05/2020.  Wound culture wound 40 is in blood cultures have been negative.  MRSA PCR was negative and Covid was negative as well as flu.  He was started on IV Zosyn and changed to IV Ceftriaxone.  He underwent a suprapubic catheter placement on 08/06/2020 by interventional radiology  On 08/08/2020 his blood count had dropped to 7.1 so he be typed and screened and transfused 1 unit of PRBCs as his blood count has been slowly trending down.  Repeat blood count 08/09/2020 is now 8.4/26.6 and stable.  Renal function is improving but he was little hyperkalemic.  He is given bolus of Lokelma.  He was found to be in new onset atrial fibrillation with RVR and was given metoprolol push.  Cardiology yesterday given his new A. fib with RVR  Cardiology evaluated for his proximal atrial fibrillation with RVR and because this occurred in the setting of Fournier's gangrene they checked a TSH for completeness and recommended continue metoprolol 100 mg p.o. twice daily for rate control and also added Cardizem 30 mg every 6 hours and recommended possibly changing to Cardizem CD 120 mg today if heart rate and blood pressure remained stable.  His CHA2DS2-VASc score was 5 and so they recommended  starting anticoagulation but only when it is okay with urology I spoke with Dr. Milford Cage of urology who okayed the heparin drip for now and when his urological procedures are improved and they are recommending Xarelto p.o.  Currently have started the transfer process to [facility] for reconstructive surgery to the care of Dr. Francesca Jewett per urology's recommendations but they are requesting urology to urology discussion after Monday and I spoke with Dr. Milford Cage and Dr. McDiarmid will call Dr. Francesca Jewett when he is back next week.  08/12/2020: He on a heparin drip yesterday and he is tolerating this well.  His lab values are improving and his potassium is slightly improved from yesterday but we will give him on low dose of Kayexalate.  His WBC is now resolved and renal function remains relatively stable.  We will discuss by urology about transferring the patient to [facility] this week.  08/13/2020: He remains on a heparin drip and has no overt signs and symptoms of bleeding.  Heart rate is much better controlled today and cardiology recommending continuing metoprolol tartrate 100 O2 twice daily as well as digoxin 0.125 mg p.o. daily.  He will remain on the heparin drip until after all his urological procedures are done and then he can be transitioned to Xarelto.  He will need to be transferred to [facility] for urethral reconstructive surgery but Dr. Matilde Sprang will need to speak with Dr. Francesca Jewett for this to happen and then once they have okayed this transfer process in order we can call the hospitalist  there for acceptance.  I tried last Friday and they told us to call back after urology has discussed the case with Dr. Francesca Jewett.  08/14/2020: Dr. Matilde Sprang called and discussed the case with [facility] hospital today to contact the patient on Wednesday pending bed availability.  Continuing wound care and heparin drip for now.  Cardiology consulted given his A. fib.  Potassium remains elevated and today it is 5.8 and he continues to refuse his  Kayexalate and his Lokelma so we will give him a 500 mL bolus and give him some IV calcium gluconate and if necessary will give him some insulin and dextrose to help bring down his potassium  Assessment & Plan:   Active Problems:   Fourniers gangrene   Penile abscess   Pressure injury of skin   Hypotension   Tachycardia   Fournier's Gangrene and necrotizing fasciitis of the penis and perineum  -Presented with septic shock and is admitted to [facility] and and transferred to [facility] service 08/05/2020 -He is status post debridement twice by Urology.  Culture growing Proteus so will continue IV Zosyn but chjanged to IV Ceftriaxone and also added Flagyl; need to continue monitor carefully and if necessary will transition back to IV Zosyn -Patient scheduled to have suprapubic catheter placed by interventional radiology and this was done 08/06/20 -Patient afebrile, WBC was trending down from Admission (27.5) and has now stabilized and resolved at 9.3 -Urology recommending changing the dressings wet-to-dry daily as his granulation tissue looks fairly well.  Urology recommending transferred to [facility] later this week to the care of Dr. Francesca Jewett for reconstruction and process has been initiated and per my discussion with the [facility] transfer coordinator as he is at capacity and recommending checking back next week -Discussed with urology and they are okay with him starting  heparin drip given his history of CVA and CHA2DS2-VASc score of 5 -I spoke with urology at [facility] Dr. Stanford Breed on 08/10/20 who is the fellow there and he feels that they do not have any intermediate beds currently still and they cannot accept the patient at this time.  He recommended calling back after Sunday when his partner Dr. Francesca Jewett is back from vacation and recommended that the urology team call to figure out what exactly the patient will require in terms of reconstruction.  We will reach out to our urology team to have them call after Sunday for  transfer for this patient as it likely needs to be a urology to urology consult; Dr. Matilde Sprang still needs to call Dr. Francesca Jewett to discuss the Urethreal Reconstructive Surgery prior to [place] attempting to transfer to [facility].  -Urology spoke with [facility] and they will tell us today if patient can be transferred Wednesday pending bed availability  HIV  -C/w Dolutegravir 50 mg po daily and Emticitabine-Tenofovir 200-25 mg po daily  Hx of CVA -Was on ASA pta and resumed  -C/w Atorvastatin 40 mg po Daily   Uncontrolled Diabetes Mellitus Type 2 -C/w Lantus 15 units sq daily; patient takes dulaglutide injection 1.5 mg in the skin once a week, Jardiance 10 mg p.o. daily, and Lantus 55 units in the skin subcu at bedtime -C/w Sensitive Novolog SSI AC -CBG's ranging from 114-248 -HbA1c is 8.8 -Continue to Monitor Blood Sugars carefully and adjust insulin as Necessary  Anxiety and Depression -C/w Sertraline 100 mg po BID  Hyponatremia -Improved. Na+ is now 141 -Continue to Monitor and Trend -IV fluid hydration is now stopped -Repeat CMP in the AM   HTN -  C/w Metoprolol 100 mg po BID -Currently his hydrochlorothiazide 25 mg p.o. daily currently being held given that he is getting IV fluid hydration -His amlodipine 5 mg p.o. daily also held given his lower blood pressures as his last blood pressure was 132/103 -continue monitor blood pressures per protocol  HLD -C/w Atorvastatin 40 mg p.o. daily; patient no longer taking fenofibrate   AKI on CKD Stage 3a -Patient's BUN/Cr is remarkably improved and is now stable at 21/1.17 yesterday and slightly went up to 23/1.28 -IV fluid hydration is now stopped by bolus given his hyperkalemia and may need some D5 and insulin -Avoid Nephrotoxic Medications, Contrast Dyes, Hypotension and Renally adjust Medications -Continue to Monitor and Trend Renal Fxn and Repeat CMP in the AM   Hyperkalemia -Patient had a mildly elevated potassium at 5.8 today -Patient  has been refusing his Lokelma so we will reorder another Kayexalate dosing 30 g and today we will give him a 500 mL bolus and also given him a dose of IV calcium gluconate: If necessary will give him some dextrose and insulin -Continue monitor trend and repeat CMP in a.m.  Normocytic Anemia/Anemia of Chronic Disease -Patient's Hgb/Hct is now 10.6/33.9 yesterday and today is now 9.1/30.9 and is relatively stable -Patient is status post 2 units of PRBCs -Continue to Monitor for S/Sx of Bleeding now that we will start him on heparin drip per urology's clearance and because he is on daily aspirin; Currently no overt bleeding noted -Repeat CBC in the AM  New Onset A. fib with RVR, improved is now better rate controlled -Patient had elevated heart rates once again this morning -EKG done in confirmed A. Fib -Echo done recently showed an EF of 40 to 45% -Avoiding Cardizem given his low EF cardiology wants to trial p.o. Cardizem and started him on 30 every 6 scheduled; cardiology starting the patient on digoxin 0.125 mg p.o. daily and recommended anticoagulation so I spoke with urology and will start the patient on a heparin drip as they have given the okay and clearance for it;  -Continue with metoprolol tartrate 100 g p.o. twice daily and given 5 mg of IV metoprolol push -Cardiology was consulted yesterday and they are recommending trialing Cardizem p.o. in addition to metoprolol; now Cardizem has been discontinued and he was on digoxin 0.125 mg p.o. daily as well as metoprolol tartrate 100 mg p.o. twice daily but now digoxin has also been discontinued -HR has been better controlled  -Patient's CHA2DS2-VASc score is at least 5 for CHF, hypertension, diabetes, and CVA -Anticoagulation is to begin with a heparin drip -Heart rate is improved and is in the low 100s -Troponin was obtained and first was 13 and next one was 17 -Continue monitor on telemetry -He will need anticoagulation given elevated  CHA2DS2-VASc score but cardiology recommending holding off p.o. anticoagulation until his urological procedures are done and recommending Xarelto p.o.  In the interim we will start the patient on heparin drip as Dr. Benancio Deeds has okayed anticoagulation at this time; he will need to be transferred to [facility] once bed is available and after urology has discussed the case with these other.  Dr. Sherron Monday advised when we should initiate the transfer process given that he still needs to speak with Dr. Guy Sandifer C will give [place] a decision about whether they can take the patient tomorrow 08/15/2020  Dilated Cardiomyopathy -He had an EF of 40 to 45% by echocardiogram and is currently on a beta-blocker with Lopressor 100 mg twice  daily but cardiology feels that he can be changed to Toprol-XL 200 mg p.o. daily if no doses are missed but they are going to consider to do this after his procedures so that he will not miss a whole day of prescription if he is n.p.o. -They are not recommending ACE/ARB/DNR I do his hyperkalemia -Cardiology recommending a repeat echo in 8 to 12 weeks after he is recovered from his procedures and then will decide on ischemic evaluation at that time  Pressure Injuries, poA Pressure Injury 03/24/17 Stage II -  Partial thickness loss of dermis presenting as a shallow open ulcer with a red, pink wound bed without slough. (Active)  03/24/17 1131  Location: Elbow  Location Orientation: Right  Staging: Stage II -  Partial thickness loss of dermis presenting as a shallow open ulcer with a red, pink wound bed without slough.  Wound Description (Comments):   Present on Admission: Yes     Pressure Injury 08/05/20 Hip Anterior;Left Stage 4 - Full thickness tissue loss with exposed bone, tendon or muscle. (Active)  08/05/20 2000  Location: Hip  Location Orientation: Anterior;Left  Staging: Stage 4 - Full thickness tissue loss with exposed bone, tendon or muscle.  Wound Description (Comments):   Present  on Admission: Yes     Pressure Injury 08/05/20 Heel Left Deep Tissue Pressure Injury - Purple or maroon localized area of discolored intact skin or blood-filled blister due to damage of underlying soft tissue from pressure and/or shear. (Active)  08/05/20 2000  Location: Heel  Location Orientation: Left  Staging: Deep Tissue Pressure Injury - Purple or maroon localized area of discolored intact skin or blood-filled blister due to damage of underlying soft tissue from pressure and/or shear.  Wound Description (Comments):   Present on Admission: Yes     Pressure Injury 08/07/20 Sacrum Mid;Lower Stage 2 -  Partial thickness loss of dermis presenting as a shallow open injury with a red, pink wound bed without slough. (Active)  08/07/20 0800  Location: Sacrum  Location Orientation: Mid;Lower  Staging: Stage 2 -  Partial thickness loss of dermis presenting as a shallow open injury with a red, pink wound bed without slough.  Wound Description (Comments):   Present on Admission:    Obesity -Complicates overall prognosis and care -Estimated body mass index is 29.93 kg/m as calculated from the following:   Height as of this encounter: 5\' 8"  (1.727 m).   Weight as of this encounter: 89.3 kg. -Weight Loss and Dietary Counseling given  DVT prophylaxis: SCDs; now we will start the patient on a heparin drip Code Status: FULL CODE  Family Communication: No family present at bedside  Disposition Plan: Likely Transfer [facility] later this week per Urology's recommendations once they have spoken with their urologist at [facility] and this could be even as soon as tomorrow 08/16/2019.  Urology Dr Nicki Reaper MacDiarmid to advise when to transfer.  Status is: Inpatient  Remains inpatient appropriate because:Unsafe d/c plan, IV treatments appropriate due to intensity of illness or inability to take PO and Inpatient level of care appropriate due to severity of illness   Dispo: The patient is from: Home               Anticipated d/c is to: SNF when he is stable but will transfer to [facility] when advised by Urology               Anticipated d/c date is: > 3 days  Patient currently is not medically stable to d/c.  Consultants:   PCCM Transfer  Urology  Interventional Radiology   Cardiology   Procedures:  Procedure(s): 1.  Washout and debridement of penile and perineal abscess  Antimicrobials:  Anti-infectives (From admission, onward)   Start     Dose/Rate Route Frequency Ordered Stop   08/09/20 1400  metroNIDAZOLE (FLAGYL) tablet 500 mg        500 mg Oral Every 8 hours 08/09/20 0956     08/06/20 1530  cefTRIAXone (ROCEPHIN) 2 g in sodium chloride 0.9 % 100 mL IVPB        2 g 200 mL/hr over 30 Minutes Intravenous Every 24 hours 08/06/20 1432     08/04/20 2200  emtricitabine-tenofovir AF (DESCOVY) 200-25 MG per tablet 1 tablet        1 tablet Oral Daily at bedtime 08/04/20 0926     08/04/20 1015  dolutegravir (TIVICAY) tablet 50 mg        50 mg Oral Daily 08/04/20 0926     08/03/20 1800  piperacillin-tazobactam (ZOSYN) IVPB 3.375 g  Status:  Discontinued        3.375 g 12.5 mL/hr over 240 Minutes Intravenous Every 8 hours 08/03/20 1719 08/06/20 1432   08/02/20 1000  vancomycin (VANCOCIN) IVPB 1000 mg/200 mL premix  Status:  Discontinued        1,000 mg 200 mL/hr over 60 Minutes Intravenous Every 12 hours 08/02/20 0354 08/04/20 0924   08/02/20 0600  ceFEPIme (MAXIPIME) 2 g in sodium chloride 0.9 % 100 mL IVPB  Status:  Discontinued        2 g 200 mL/hr over 30 Minutes Intravenous Every 8 hours 08/01/20 2148 08/02/20 0345   08/02/20 0600  piperacillin-tazobactam (ZOSYN) IVPB 3.375 g  Status:  Discontinued        3.375 g 12.5 mL/hr over 240 Minutes Intravenous Every 8 hours 08/02/20 0328 08/03/20 1719   08/01/20 2230  vancomycin (VANCOCIN) IVPB 1000 mg/200 mL premix        1,000 mg 200 mL/hr over 60 Minutes Intravenous  Once 08/01/20 2225 08/02/20 0028   08/01/20 2230  clindamycin  (CLEOCIN) IVPB 600 mg        600 mg 100 mL/hr over 30 Minutes Intravenous  Once 08/01/20 2225 08/01/20 2320   08/01/20 2045  ceFEPIme (MAXIPIME) 2 g in sodium chloride 0.9 % 100 mL IVPB        2 g 200 mL/hr over 30 Minutes Intravenous  Once 08/01/20 2041 08/01/20 2249       Subjective: Seen and examined at bedside and remains calm and comfortable laying in the bed.  Denies any complaints and has no chest pain, shortness breath, nausea or vomiting.  Denies any pain.  No other concerns or complaints at this time.  Feels well.   Objective: Vitals:   08/13/20 1802 08/13/20 2207 08/14/20 0404 08/14/20 0824  BP: (!) 113/98 112/84 136/87 (!) 132/103  Pulse: 73 98 79 73  Resp: 18 18 18 18   Temp:  98.3 F (36.8 C) 97.9 F (36.6 C) (!) 97.5 F (36.4 C)  TempSrc:  Oral Oral Oral  SpO2:  96% 98% 97%  Weight:   89.3 kg   Height:        Intake/Output Summary (Last 24 hours) at 08/14/2020 1216 Last data filed at 08/14/2020 0825 Gross per 24 hour  Intake 855.21 ml  Output 1200 ml  Net -344.79 ml   Filed Weights   08/10/20  0425 08/11/20 0411 08/14/20 0404  Weight: 93.9 kg 94.3 kg 89.3 kg   Examination: Physical Exam:  Constitutional: WN/WD mildly ill-appearing Caucasian male currently no acute distress appears calm and is resting. Eyes: Lids and conjunctivae normal, sclerae anicteric  ENMT: External Ears, Nose appear normal. Grossly normal hearing. Neck: Appears normal, supple, no cervical masses, normal ROM, no appreciable thyromegaly; no JVD Respiratory: Diminished to auscultation bilaterally with coarse breath sounds, no wheezing, rales, rhonchi or crackles. Normal respiratory effort and patient is not tachypenic. No accessory muscle use.  Cardiovascular: Irregularly irregular and mildly tachycardic., no murmurs / rubs / gallops. S1 and S2 auscultated.  Has no appreciable extremity edema noted. Abdomen: Soft, non-tender, distended secondary body habitus.  Bowel sounds positive.  GU:  Deferred.  Foley is in place and he is right PCN.  I did not view his surgical wound Musculoskeletal: No clubbing / cyanosis of digits/nails. Has a right BKA Skin: No rashes, lesions on limited skin evaluation but does have a left hip lesion skin appears warm and dry Neurologic: CN 2-12 grossly intact with no focal deficits. Romberg sign and cerebellar reflexes not assessed.  Psychiatric: Normal judgment and insight. Alert and oriented x 3. Normal mood and appropriate affect.   Data Reviewed: I have personally reviewed following labs and imaging studies  CBC: Recent Labs  Lab 08/10/20 0122 08/11/20 0407 08/11/20 2351 08/13/20 0146 08/14/20 0345  WBC 14.4* 10.3 9.5 10.0 9.3  NEUTROABS 12.3* 8.3* 7.3 8.1* 7.1  HGB 8.8* 9.1* 9.8* 10.6* 9.1*  HCT 27.8* 30.1* 30.5* 33.9* 30.9*  MCV 90.8 92.9 91.3 93.1 96.0  PLT 318 357 344 312 [id number]   Basic Metabolic Panel: Recent Labs  Lab 08/10/20 0122 08/11/20 0407 08/11/20 2351 08/13/20 0146 08/14/20 0345  NA 142 141 142 138 141  K 5.5* 5.6* 5.4* 5.6* 5.8*  CL 112* 112* 115* 114* 113*  CO2 22 21* 21* 18* 23  GLUCOSE 193* 163* 98 162* 233*  BUN 24* 24* 22* 21* 22*  CREATININE 1.33* 1.19 1.21 1.17 1.28*  CALCIUM 7.7* 7.9* 7.8* 7.9* 7.8*  MG 2.1 2.2 2.2 2.1 2.2  PHOS 3.2 3.4 3.3 3.8 4.1   GFR: Estimated Creatinine Clearance: 77.5 mL/min (A) (by C-G formula based on SCr of 1.28 mg/dL (H)). Liver Function Tests: Recent Labs  Lab 08/10/20 0122 08/11/20 0407 08/11/20 2351 08/13/20 0146 08/14/20 0345  AST 20 19 15 17 20   ALT 19 17 15 15 15   ALKPHOS 87 92 82 95 86  BILITOT 0.4 0.3 0.4 0.7 0.5  PROT 6.0* 6.0* 6.0* 6.2* 5.7*  ALBUMIN 1.6* 1.6* 1.7* 1.7* 1.6*   No results for input(s): LIPASE, AMYLASE in the last 168 hours. No results for input(s): AMMONIA in the last 168 hours. Coagulation Profile: No results for input(s): INR, PROTIME in the last 168 hours. Cardiac Enzymes: No results for input(s): CKTOTAL, CKMB, CKMBINDEX,  TROPONINI in the last 168 hours. BNP (last 3 results) No results for input(s): PROBNP in the last 8760 hours. HbA1C: No results for input(s): HGBA1C in the last 72 hours. CBG: Recent Labs  Lab 08/13/20 1108 08/13/20 1548 08/13/20 2214 08/14/20 0755 08/14/20 1150  GLUCAP 114* 184* 248* 197* 214*   Lipid Profile: No results for input(s): CHOL, HDL, LDLCALC, TRIG, CHOLHDL, LDLDIRECT in the last 72 hours. Thyroid Function Tests: No results for input(s): TSH, T4TOTAL, FREET4, T3FREE, THYROIDAB in the last 72 hours. Anemia Panel: No results for input(s): VITAMINB12, FOLATE, FERRITIN, TIBC, IRON, RETICCTPCT in the last  72 hours. Sepsis Labs: No results for input(s): PROCALCITON, LATICACIDVEN in the last 168 hours.  Recent Results (from the past 240 hour(s))  Surgical pcr screen     Status: None   Collection Time: 08/05/20  5:08 AM   Specimen: Nasal Mucosa; Nasal Swab  Result Value Ref Range Status   MRSA, PCR NEGATIVE NEGATIVE Final   Staphylococcus aureus NEGATIVE NEGATIVE Final    Comment: (NOTE) The Xpert SA Assay (FDA approved for NASAL specimens in patients 24 years of age and older), is one component of a comprehensive surveillance program. It is not intended to diagnose infection nor to guide or monitor treatment. Performed at [facility], [place] [address] [place], [place] [address]      RN Pressure Injury Documentation: Pressure Injury 03/24/17 Stage II -  Partial thickness loss of dermis presenting as a shallow open ulcer with a red, pink wound bed without slough. (Active)  03/24/17 1131  Location: Elbow  Location Orientation: Right  Staging: Stage II -  Partial thickness loss of dermis presenting as a shallow open ulcer with a red, pink wound bed without slough.  Wound Description (Comments):   Present on Admission: Yes     Pressure Injury 08/05/20 Hip Anterior;Left Stage 4 - Full thickness tissue loss with exposed bone, tendon or muscle. (Active)  08/05/20  2000  Location: Hip  Location Orientation: Anterior;Left  Staging: Stage 4 - Full thickness tissue loss with exposed bone, tendon or muscle.  Wound Description (Comments):   Present on Admission: Yes     Pressure Injury 08/05/20 Heel Left Deep Tissue Pressure Injury - Purple or maroon localized area of discolored intact skin or blood-filled blister due to damage of underlying soft tissue from pressure and/or shear. (Active)  08/05/20 2000  Location: Heel  Location Orientation: Left  Staging: Deep Tissue Pressure Injury - Purple or maroon localized area of discolored intact skin or blood-filled blister due to damage of underlying soft tissue from pressure and/or shear.  Wound Description (Comments):   Present on Admission: Yes     Pressure Injury 08/07/20 Sacrum Mid;Lower Stage 2 -  Partial thickness loss of dermis presenting as a shallow open injury with a red, pink wound bed without slough. (Active)  08/07/20 0800  Location: Sacrum  Location Orientation: Mid;Lower  Staging: Stage 2 -  Partial thickness loss of dermis presenting as a shallow open injury with a red, pink wound bed without slough.  Wound Description (Comments):   Present on Admission:    Estimated body mass index is 29.93 kg/m as calculated from the following:   Height as of this encounter: 5\' 8"  (1.727 m).   Weight as of this encounter: 89.3 kg.  Malnutrition Type:  Nutrition Problem: Increased nutrient needs Etiology: wound healing  Malnutrition Characteristics:  Signs/Symptoms: estimated needs  Nutrition Interventions:  Interventions: Ensure Enlive (each supplement provides 350kcal and 20 grams of protein),MVI   Radiology Studies: No results found. Scheduled Meds: . sodium chloride   Intravenous Once  . aspirin EC  81 mg Oral QHS  . atorvastatin  40 mg Oral Daily  . Chlorhexidine Gluconate Cloth  6 each Topical Daily  . dolutegravir  50 mg Oral Daily  . emtricitabine-tenofovir AF  1 tablet Oral QHS   . feeding supplement (GLUCERNA SHAKE)  237 mL Oral TID BM  . insulin aspart  0-5 Units Subcutaneous QHS  . insulin aspart  0-9 Units Subcutaneous TID WC  . insulin glargine  15 Units Subcutaneous Daily  .  metoprolol tartrate  100 mg Oral BID  . metroNIDAZOLE  500 mg Oral Q8H  . multivitamin with minerals  1 tablet Oral Daily  . nutrition supplement (JUVEN)  1 packet Oral BID BM  . sertraline  100 mg Oral BID  . sodium chloride flush  5 mL Intracatheter Q8H   Continuous Infusions: . cefTRIAXone (ROCEPHIN)  IV Stopped (08/14/20 1027)  . heparin 1,900 Units/hr (08/14/20 0825)    LOS: 12 days   Kerney Elbe, DO [facility] PAGER is on [place]  If 7PM-7AM, please contact night-coverage [email or web address]

## 2020-08-14 NOTE — Progress Notes (Signed)
ANTICOAGULATION CONSULT NOTE   Pharmacy Consult for Heparin  Indication: atrial fibrillation  No Known Allergies  Patient Measurements: Height: 5\' 8"  (172.7 cm) Weight: 89.3 kg (196 lb 13.9 oz) IBW/kg (Calculated) : 68.4 HEPARIN DW (KG): 92.9   Vital Signs: Temp: 97.5 F (36.4 C) (01/04 0824) Temp Source: Oral (01/04 0824) BP: 132/103 (01/04 0824) Pulse Rate: 73 (01/04 0824)  Labs: Recent Labs    08/11/20 2351 08/12/20 0220 08/13/20 0146 08/14/20 0345 08/14/20 0553  HGB 9.8*  --  10.6* 9.1*  --   HCT 30.5*  --  33.9* 30.9*  --   PLT 344  --  312 290  --   HEPARINUNFRC <0.10*   < > 0.35 1.88* 0.57  CREATININE 1.21  --  1.17 1.28*  --    < > = values in this interval not displayed.    Estimated Creatinine Clearance: 77.5 mL/min (A) (by C-G formula based on SCr of 1.28 mg/dL (H)).   Medical History: Past Medical History:  Diagnosis Date  . Allergy   . Anxiety and depression 05/29/2017  . Atrial fibrillation (HCC)   . Diabetes mellitus without complication (HCC)    type 2  . HIV (human immunodeficiency virus infection) (HCC)   . Hyperlipidemia   . Hypertension   . Stroke [facility])     Medications:   . sodium chloride   Intravenous Once  . aspirin EC  81 mg Oral QHS  . atorvastatin  40 mg Oral Daily  . Chlorhexidine Gluconate Cloth  6 each Topical Daily  . dolutegravir  50 mg Oral Daily  . emtricitabine-tenofovir AF  1 tablet Oral QHS  . feeding supplement (GLUCERNA SHAKE)  237 mL Oral TID BM  . insulin aspart  0-5 Units Subcutaneous QHS  . insulin aspart  0-9 Units Subcutaneous TID WC  . insulin glargine  15 Units Subcutaneous Daily  . metoprolol tartrate  100 mg Oral BID  . metroNIDAZOLE  500 mg Oral Q8H  . multivitamin with minerals  1 tablet Oral Daily  . nutrition supplement (JUVEN)  1 packet Oral BID BM  . sertraline  100 mg Oral BID  . sodium chloride flush  5 mL Intracatheter Q8H    Assessment: 50 yo male with afib (CHADSVASC= 5). Pharmacy  consulted to dose heparin. He is also noted with necrotizing fasciitis of the penis and perineum and plans reconstruction after transfer to [facility].  Also s/p operative debridement 12/22, 12/24 and 12/26.  Heparin level continues to be at goal (0.57) this morning. Initial level was lab error. No bleeding issues noted. Hgb down slightly 10.6>9.1.   Goal of Therapy:  Heparin level 0.3-0.7 units/ml Monitor platelets by anticoagulation protocol: Yes   Plan:  -Continue heparin 1900 units/hr -Daily heparin level and cbc  1/27 PharmD., BCPS Clinical Pharmacist 08/14/2020 10:38 AM

## 2020-08-15 DIAGNOSIS — I10 Essential (primary) hypertension: Secondary | ICD-10-CM | POA: Diagnosis not present

## 2020-08-15 DIAGNOSIS — I4819 Other persistent atrial fibrillation: Secondary | ICD-10-CM | POA: Diagnosis not present

## 2020-08-15 DIAGNOSIS — I420 Dilated cardiomyopathy: Secondary | ICD-10-CM | POA: Diagnosis not present

## 2020-08-15 DIAGNOSIS — E875 Hyperkalemia: Secondary | ICD-10-CM | POA: Diagnosis not present

## 2020-08-15 DIAGNOSIS — N493 Fournier gangrene: Secondary | ICD-10-CM | POA: Diagnosis not present

## 2020-08-15 LAB — BASIC METABOLIC PANEL
Anion gap: 9 (ref 5–15)
BUN: 21 mg/dL — ABNORMAL HIGH (ref 6–20)
CO2: 20 mmol/L — ABNORMAL LOW (ref 22–32)
Calcium: 8 mg/dL — ABNORMAL LOW (ref 8.9–10.3)
Chloride: 110 mmol/L (ref 98–111)
Creatinine, Ser: 1.1 mg/dL (ref 0.61–1.24)
GFR, Estimated: >60 mL/min (ref >60)
Glucose, Bld: 152 mg/dL — ABNORMAL HIGH (ref 70–99)
Potassium: 5.9 mmol/L — ABNORMAL HIGH (ref 3.5–5.1)
Sodium: 139 mmol/L (ref 135–145)

## 2020-08-15 LAB — GLUCOSE, CAPILLARY
Glucose-Capillary: 143 mg/dL — ABNORMAL HIGH (ref 70–99)
Glucose-Capillary: 159 mg/dL — ABNORMAL HIGH (ref 70–99)
Glucose-Capillary: 72 mg/dL (ref 70–99)
Glucose-Capillary: 97 mg/dL (ref 70–99)

## 2020-08-15 LAB — POTASSIUM: Potassium: 5.3 mmol/L — ABNORMAL HIGH (ref 3.5–5.1)

## 2020-08-15 LAB — HEPARIN LEVEL (UNFRACTIONATED): Heparin Unfractionated: 0.58 IU/mL (ref 0.30–0.70)

## 2020-08-15 MED ORDER — INSULIN ASPART 100 UNIT/ML IV SOLN
10.0000 [IU] | Freq: Once | INTRAVENOUS | Status: AC
  Administered 2020-08-15: 10 [IU] via INTRAVENOUS

## 2020-08-15 MED ORDER — DEXTROSE 50 % IV SOLN
1.0000 | Freq: Once | INTRAVENOUS | Status: AC
  Administered 2020-08-15: 50 mL via INTRAVENOUS
  Filled 2020-08-15: qty 50

## 2020-08-15 MED ORDER — METRONIDAZOLE 500 MG PO TABS: 500.0000 mg | ORAL_TABLET | Freq: Three times a day (TID) | ORAL | 0 refills | Status: AC

## 2020-08-15 MED ORDER — SODIUM CHLORIDE 0.9 % IV SOLN: 2.0000 g | INTRAVENOUS | 0 refills | Status: AC

## 2020-08-15 NOTE — Progress Notes (Signed)
ANTICOAGULATION CONSULT NOTE   Pharmacy Consult for Heparin  Indication: atrial fibrillation  No Known Allergies  Patient Measurements: Height: 5\' 8"  (172.7 cm) Weight: 89.7 kg (197 lb 12 oz) IBW/kg (Calculated) : 68.4 HEPARIN DW (KG): 92.9   Vital Signs: Temp: 98 F (36.7 C) (01/05 0445) Temp Source: Oral (01/05 0445) BP: 122/101 (01/05 0445) Pulse Rate: 112 (01/05 0445)  Labs: Recent Labs    08/13/20 0146 08/14/20 0345 08/14/20 0553 08/15/20 0402  HGB 10.6* 9.1*  --   --   HCT 33.9* 30.9*  --   --   PLT 312 290  --   --   HEPARINUNFRC 0.35 1.88* 0.57 0.58  CREATININE 1.17 1.28*  --  1.10    Estimated Creatinine Clearance: 90.3 mL/min (by C-G formula based on SCr of 1.1 mg/dL).   Medical History: Past Medical History:  Diagnosis Date  . Allergy   . Anxiety and depression 05/29/2017  . Atrial fibrillation (HCC)   . Diabetes mellitus without complication (HCC)    type 2  . HIV (human immunodeficiency virus infection) (HCC)   . Hyperlipidemia   . Hypertension   . Stroke [facility])     Medications:   . sodium chloride   Intravenous Once  . aspirin EC  81 mg Oral QHS  . atorvastatin  40 mg Oral Daily  . Chlorhexidine Gluconate Cloth  6 each Topical Daily  . dolutegravir  50 mg Oral Daily  . emtricitabine-tenofovir AF  1 tablet Oral QHS  . feeding supplement (GLUCERNA SHAKE)  237 mL Oral TID BM  . insulin aspart  0-5 Units Subcutaneous QHS  . insulin aspart  0-9 Units Subcutaneous TID WC  . insulin glargine  15 Units Subcutaneous Daily  . metoprolol tartrate  100 mg Oral BID  . metroNIDAZOLE  500 mg Oral Q8H  . multivitamin with minerals  1 tablet Oral Daily  . nutrition supplement (JUVEN)  1 packet Oral BID BM  . sertraline  100 mg Oral BID  . sodium chloride flush  5 mL Intracatheter Q8H    Assessment: 50 yo male with afib (CHADSVASC= 5). Pharmacy consulted to dose heparin. He is also noted with necrotizing fasciitis of the penis and perineum and plans  reconstruction after transfer to [facility].  Also s/p operative debridement 12/22, 12/24 and 12/26.  Heparin level continues to be at goal (0.5) this morning. No bleeding issues noted. Hgb down slightly 10.6>9.1, no cbc this am.   Goal of Therapy:  Heparin level 0.3-0.7 units/ml Monitor platelets by anticoagulation protocol: Yes   Plan:  -Continue heparin 1900 units/hr -Daily heparin level and cbc  1/27 PharmD., BCPS Clinical Pharmacist 08/15/2020 2:07 PM

## 2020-08-15 NOTE — Progress Notes (Signed)
Nutrition Follow-up  DOCUMENTATION CODES:   Not applicable  INTERVENTION:   Continue:  -Glucerna Shake po TID, each supplement provides 220 kcal and 10 grams of protein  -MVI with minerals   -1 packet Juven BID, each packet provides 95 calories, 2.5 grams of protein (collagen), and 9.8 grams of carbohydrate (3 grams sugar); also contains 7 grams of L-arginine and L-glutamine, 300 mg vitamin C, 15 mg vitamin E, 1.2 mcg vitamin B-12, 9.5 mg zinc, 200 mg calcium, and 1.5 g  Calcium Beta-hydroxy-Beta-methylbutyrate to support wound healing  NUTRITION DIAGNOSIS:   Increased nutrient needs related to wound healing as evidenced by estimated needs.  Ongoing  GOAL:   Patient will meet greater than or equal to 90% of their needs  Progressing  MONITOR:   Diet advancement,PO intake,Skin,Labs,Supplement acceptance  REASON FOR ASSESSMENT:   Rounds    ASSESSMENT:   50 yo male admitted with Fournier's gangrene. S/P I&D of penile abscess on admission. PMH includes HTN, HIV, CVA, DM, R BKA, anxiety, depression.   Spoke with pt at bedside. He reports his appetite is good. He reports eating most of his meals and  drinking 3-4 Glucerna's a day. He reports drinking Juven every other day. Glucerna was noted at bedside during visit with 100% consumed. Per chart review, pt has not refused any Glucerna, but has refused 10 out of 14 Juven supplements provided to him since 12/29. Per chart review, pt consuming 0-100% of documented meal records, although recent data is limited.   Per urology, pt is transferring to [facility] when medically safe for d/c and a bed is available.    Admit weight: 89.3 kg. Current weight: 89.7 kg  Labs reviewed: Potassium 5.9, CBG's x 24 hours: 133-214  Medications reviewed and include: SSI, Lopressor, Flagyl, Zoloft, Glargine 15 units, Dolutegravir   Diet Order:   Diet Order            Diet heart healthy/carb modified Room service appropriate? Yes; Fluid consistency:  Thin  Diet effective now                 EDUCATION NEEDS:   Education needs have been addressed (encouraged adequate protein intake to support healing)  Skin:  Skin Assessment: Skin Integrity Issues: Skin Integrity Issues:: Stage II,Stage IV,DTI DTI: L heel Stage II: sacrum Stage IV: L hip Incisions: Fournier's gangrene to penis, S/P I&D  Last BM:  08/13/2020  Height:   Ht Readings from Last 1 Encounters:  08/02/20 5\' 8"  (1.727 m)    Weight:   Wt Readings from Last 1 Encounters:  08/15/20 89.7 kg    Ideal Body Weight:  65.5 kg (adjusted for BKA)  BMI:  Body mass index is 30.07 kg/m.  Estimated Nutritional Needs:   Kcal:  2300-2500  Protein:  125-150 gm  Fluid:  >/= 2.3 L    10/13/20, Dietetic Intern Pager: [phone] If unavailable: [phone]

## 2020-08-15 NOTE — Progress Notes (Signed)
Report called to you [facility] nurse Elie Confer RN.

## 2020-08-15 NOTE — Progress Notes (Addendum)
Progress Note  Patient Name: Francisco Hutchinson Date of Encounter: 08/15/2020  [facility] Cardiologist: Kirk Ruths, MD   Subjective   No acute overnight events. Feeling OK this morning with no cardiac complaints. Unaware that he is in atrial fibrillation. No palpitations, chest pain, or shortness of breath. Waiting to be transferred to [facility].   Inpatient Medications    Scheduled Meds: . sodium chloride   Intravenous Once  . aspirin EC  81 mg Oral QHS  . atorvastatin  40 mg Oral Daily  . Chlorhexidine Gluconate Cloth  6 each Topical Daily  . dolutegravir  50 mg Oral Daily  . emtricitabine-tenofovir AF  1 tablet Oral QHS  . feeding supplement (GLUCERNA SHAKE)  237 mL Oral TID BM  . insulin aspart  0-5 Units Subcutaneous QHS  . insulin aspart  0-9 Units Subcutaneous TID WC  . insulin glargine  15 Units Subcutaneous Daily  . metoprolol tartrate  100 mg Oral BID  . metroNIDAZOLE  500 mg Oral Q8H  . multivitamin with minerals  1 tablet Oral Daily  . nutrition supplement (JUVEN)  1 packet Oral BID BM  . sertraline  100 mg Oral BID  . sodium chloride flush  5 mL Intracatheter Q8H   Continuous Infusions: . cefTRIAXone (ROCEPHIN)  IV Stopped (08/14/20 1027)  . heparin 1,900 Units/hr (08/15/20 0407)   PRN Meds: docusate sodium, HYDROmorphone, ondansetron (ZOFRAN) IV, polyethylene glycol   Vital Signs    Vitals:   08/14/20 1520 08/14/20 2116 08/15/20 0352 08/15/20 0445  BP: 118/86 (!) 133/92  (!) 122/101  Pulse: 81 74 (!) 48 (!) 112  Resp: 17 17  18   Temp: 97.9 F (36.6 C) 98.4 F (36.9 C)  98 F (36.7 C)  TempSrc: Oral Oral  Oral  SpO2: 100% 98% 96% 98%  Weight:   89.7 kg   Height:        Intake/Output Summary (Last 24 hours) at 08/15/2020 0744 Last data filed at 08/15/2020 0500 Gross per 24 hour  Intake 2498.87 ml  Output 1175 ml  Net 1323.87 ml   Last 3 Weights 08/15/2020 08/14/2020 08/11/2020  Weight (lbs) 197 lb 12 oz 196 lb 13.9 oz 207 lb 14.3 oz  Weight (kg)  89.7 kg 89.3 kg 94.3 kg      Telemetry    Atrial fibrillation with rates in the 80's to 90's (occasional low 100's). Occasional pause noted but all < 2 seconds. - Personally Reviewed  ECG    No new ECG tracing today. - Personally Reviewed  Physical Exam   GEN: No acute distress.   Neck: Supple. Cardiac: Irregularly irregular rhythm with normal rate. No murmurs, rubs, or gallops.  Respiratory: Clear to auscultation bilaterally. GI: Soft, non-distended, and non-tender. Bowel sounds present. MS: S/p right below knee amputation. No left lower extremity edema.  Neuro:  No focal deficits. Psych: Normal affect. Responds appropriately.  Labs    High Sensitivity Troponin:   Recent Labs  Lab 08/09/20 1105 08/09/20 1220  TROPONINIHS 13 17      Chemistry Recent Labs  Lab 08/11/20 2351 08/13/20 0146 08/14/20 0345 08/15/20 0402  NA 142 138 141 139  K 5.4* 5.6* 5.8* 5.9*  CL 115* 114* 113* 110  CO2 21* 18* 23 20*  GLUCOSE 98 162* 233* 152*  BUN 22* 21* 22* 21*  CREATININE 1.21 1.17 1.28* 1.10  CALCIUM 7.8* 7.9* 7.8* 8.0*  PROT 6.0* 6.2* 5.7*  --   ALBUMIN 1.7* 1.7* 1.6*  --  AST 15 17 20   --   ALT 15 15 15   --   ALKPHOS 82 95 86  --   BILITOT 0.4 0.7 0.5  --   GFRNONAA >60 >60 >60 >60  ANIONGAP 6 6 5 9      Hematology Recent Labs  Lab 08/11/20 2351 08/13/20 0146 08/14/20 0345  WBC 9.5 10.0 9.3  RBC 3.34* 3.64* 3.22*  HGB 9.8* 10.6* 9.1*  HCT 30.5* 33.9* 30.9*  MCV 91.3 93.1 96.0  MCH 29.3 29.1 28.3  MCHC 32.1 31.3 29.4*  RDW 18.1* 18.3* 18.6*  PLT 344 312 290    BNPNo results for input(s): BNP, PROBNP in the last 168 hours.   DDimer No results for input(s): DDIMER in the last 168 hours.   Radiology    No results found.  Cardiac Studies   Echocardiogram 08/04/2020: Impressions: 1. Left ventricular ejection fraction, by estimation, is 40 to 45%. The  left ventricle has mildly decreased function. The left ventricle  demonstrates global  hypokinesis. Left ventricular diastolic function could  not be evaluated.  2. Right ventricular systolic function is mildly reduced. The right  ventricular size is mildly enlarged. There is mildly elevated pulmonary  artery systolic pressure. The estimated right ventricular systolic  pressure is 42.5 mmHg.  3. Left atrial size was mildly dilated.  4. The mitral valve is grossly normal. Trivial mitral valve  regurgitation. No evidence of mitral stenosis.  5. The aortic valve is tricuspid. Aortic valve regurgitation is not  visualized. No aortic stenosis is present.  6. The inferior vena cava is dilated in size with <50% respiratory  variability, suggesting right atrial pressure of 15 mmHg.   Patient Profile     50 y.o. male with a history of CVA, PAD s/p right below knee amputation in 01/2018 due to critical limb ischemia, hypertension, diabetes mellitus, HIV, and Fournier's gangrene who was admitted on 12/23/2021for Fournier's gangrene. He underwent re-exploration and debridement of penis and groin on 08/03/2020 and then irrigation and debridement of penis and perineum. Cardiology was consulted on 08/09/2020 for further evaluation of atrial fibrillation.   Assessment & Plan    Persistent Atrial Fibrillation - Rates well controlled. - Echo showed LVEF of 40-45% with global hypokinesis. RV mildly enlarged with mildly reduced systolic function and mildly elevated PASP.  - Continue Lopressor 100mg  twice daily. - Initially started on Digoxin but this was stopped yesterday due to persistent hyperkalemia. - On IV Heparin for now in anticipation of additional urological procedures. Plan is for  transfer to [facility] for reconstructive surgery.  Dilated Cardiomyopathy - EF mildly reduced at 40-45%.  - Continue beta-blocker. Consider transitioning to Toprol-XL prior to discharge. - Hold off on adding ARB at this time given hyperkalemia. - Continue to monitor volume status closely. -  Repeat Echo was recommended once all procedure are complete and he recovers from present illness. Not clear that he would be a good candidate for aggressive ischemic evaluation given comorbidities.   Hypertension - Diastolic BP mildly elevated at time. - Continue Lopressor as above. - Consider restarting home Amlodipine 5mg  daily.  Hyperlipidemia - Continue home Lipitor 40mg  daily.  Hyperkalemia - Potassium continues to rise and is 5.9 today. - Patient has reportedly been refusing to take Kayexalate and Lokelma although he did get a dose of Kayexalate yesterday. He also received IV bolus and IV calcium gluconate yesterday. - Management per primary team.  AKI - Creatinine 1.51 on admission and then peaked at 2.42 on 12/27.  Most recent baseline around 1.5 to 1.7 - Resolved. Creatinine 1.10 today. - Continue to monitor closely.  HIV - Management per primary team.  Fournier's Gangrene - S/p re-exploration and debridement of penis and groin on 08/03/2020 and then irrigation and debridement of penis and perineum.  - Management per Urology. Plan is for  transfer to [facility] for reconstructive surgery (hopefully today).  For questions or updates, please contact [place] Please consult [email or web address] for contact info under        Signed, Darreld Mclean, PA-C  08/15/2020, 7:44 AM    I have seen and examined the patient along with Darreld Mclean, PA-C .  I have reviewed the chart, notes and new data.  I agree with PA/NP's note.  Key new complaints: no CV complaints Key examination changes: irregular rhythm, well rate controlled Key new findings / data: K 5.9 - persistently elevated  PLAN: [facility] will sign off.   Medication Recommendations:  Metoprolol tartrate 100 mg twice daily; switch from heparin IV to DOAC once no plans for additional surgery Other recommendations (labs, testing, etc):  Monitor K Follow up as an outpatient:  After complete resolution  of his surgical issues (2-3 months).   Sanda Klein, MD, [place] [phone] 08/15/2020, 12:53 PM

## 2020-08-15 NOTE — Discharge Summary (Signed)
Physician Discharge Summary  Francisco Hutchinson DHW:[id number] DOB: [birth year] DOA: 08/01/2020  PCP: Francisco Barrack, MD  Admit date: 08/01/2020 Discharge date: 08/15/2020  Admitted From: Home Disposition: Transfer to [facility] under urology  Recommendations for Outpatient Follow-up:  1. Follow up with PCP in 1-2 weeks 2. Please obtain BMP/CBC in one week 3. Please follow up with your PCP on the following pending results: Unresulted Labs (From admission, onward)          Start     Ordered   08/15/20 1300  Potassium  Once,   R       Question:  Specimen collection method  Answer:  Lab=Lab collect   08/15/20 0959   08/15/20 1227  Cd4/cd8 (t-helper/t-suppressor cell)  Once,   R       Question:  Specimen collection method  Answer:  Lab=Lab collect   08/15/20 1226   08/13/20 0211  Basic metabolic panel  Daily,   R     Question:  Specimen collection method  Answer:  Lab=Lab collect   08/12/20 0910   08/12/20 0500  Heparin level (unfractionated)  Daily,   R     Question:  Specimen collection method  Answer:  Lab=Lab collect   08/11/20 1651   08/06/20 0500  CBC  Daily,   R     Question:  Specimen collection method  Answer:  Lab=Lab collect   08/05/20 1450           Home Health: None Equipment/Devices: None  Discharge Condition: Stable CODE STATUS: Full code Diet recommendation: Cardiac  Subjective: Seen and examined.  Has no complaints.  Brief/Interim Summary: The patient is a 50 year old nursing home resident with a past medical history significant for but not limited to hypertension, HIV, CVA, diabetes mellitus type 2, anxiety and depression, as well as other comorbidities who presented to the emergency room with septic shock due to Fournier's gangrene, urology consulted and patient was taken urgently to the OR for incision and drainage.  He was then taken back to the OR on 08/05/2020 for a washout and debridement of the penile and perineal abscess at the patient has had necrotizing  fasciitis of the penis and perineum.  He was under ICU service and then He was transferred to [facility] service 08/05/2020. MRSA PCR was negative and Covid was negative as well as flu.  He was started on IV Zosyn and changed to IV Ceftriaxone.  He underwent a suprapubic catheter placement on 08/06/2020 by interventional radiology. On 08/08/2020 his blood count had dropped to 7.1 so he was transfused 1 unit of PRBCs.  On same day, he was also diagnosed with new onset atrial fibrillation with RVR.  This was not controlled with IV metoprolol push so cardiology was consulted.  He was tried on several rate control medications including Cardizem, metoprolol and digoxin and then also started on heparin drip.  All of them were discontinued except metoprolol which has helped control his heart rate and he remains on heparin for prevention of thromboembolism as his CHA2DS2-VASc score is 5.  Transthoracic echo was also done which showed ejection fraction of 40 to 45% with dilated cardiomyopathy.  Cardiology did not recommend starting ACE inhibitor's or ARB's.  Urology recommended transfer to [facility] for further reconstructive surgery under Dr. Francesca Jewett.  Transfer process was initiated on 08/12/2020 by urology Dr. Matilde Sprang.  He has been accepted to [facility] now.  I had given a detailed signout to Dr. Francesca Jewett myself over the phone.  He requested  checking CD4 and CD8 count on the patient which I have ordered however results will not be available until tomorrow.  Urology over there wanted to see the results before surgery which is scheduled on Friday.  Besides this, patient also has been having hyperkalemia.  Patient has been refusing to take Kayexalate and Lokelma however he has been given some insulin with dextrose.  His potassium today is 5.9.  He has received in the dose of 10 units of IV regular insulin along with D50 1 ampoule.  No events noted on EKG or monitor.  He also received calcium gluconate yesterday.  Patient has no symptoms today.   Rest of his medical issues remained fairly controlled but labile.  Patient is going to be discharged/transferred to [facility] today in stable condition for further care.  Discharge Diagnoses:  Active Problems:   Fourniers gangrene   Penile abscess   Pressure injury of skin   Hypotension   Tachycardia    Discharge Instructions   Allergies as of 08/15/2020   No Known Allergies     Medication List    STOP taking these medications   acetic acid 2 % otic solution   amoxicillin 875 MG tablet Commonly known as: AMOXIL   collagenase ointment Commonly known as: SANTYL   fenofibrate 48 MG tablet Commonly known as: TRICOR   fenofibrate micronized 43 MG capsule Commonly known as: ANTARA   hydrochlorothiazide 25 MG tablet Commonly known as: HYDRODIURIL     TAKE these medications   acetaminophen 325 MG tablet Commonly known as: TYLENOL Take 2 tablets (650 mg total) by mouth every 6 (six) hours as needed for mild pain, fever or headache (or Fever >/= 101).   amLODipine 5 MG tablet Commonly known as: NORVASC TAKE 1 TABLET BY MOUTH EVERY DAY   aspirin EC 81 MG tablet Take 81 mg by mouth at bedtime.   atorvastatin 40 MG tablet Commonly known as: LIPITOR Take 1 tablet (40 mg total) by mouth daily.   cefTRIAXone 2 g in sodium chloride 0.9 % 100 mL Inject 2 g into the vein daily. Start taking on: August 16, 2020   cyclobenzaprine 10 MG tablet Commonly known as: FLEXERIL Take 1 tablet (10 mg total) by mouth 3 (three) times daily as needed for muscle spasms.   Descovy 200-25 MG tablet Generic drug: emtricitabine-tenofovir AF Take 1 tablet by mouth at bedtime.   ferrous gluconate 324 MG tablet Commonly known as: FERGON Take 1 tablet (324 mg total) by mouth daily with breakfast.   Incontinence Supplies Kit Use as needed for incontinence   Jardiance 10 MG Tabs tablet Generic drug: empagliflozin TAKE 1 TABLET BY MOUTH EVERY DAY What changed: how much to take   Lantus  SoloStar 100 UNIT/ML Solostar Pen Generic drug: insulin glargine Inject 55 Units into the skin at bedtime.   metoprolol tartrate 100 MG tablet Commonly known as: LOPRESSOR Take 1 tablet (100 mg total) by mouth 2 (two) times daily.   metroNIDAZOLE 500 MG tablet Commonly known as: FLAGYL Take 1 tablet (500 mg total) by mouth every 8 (eight) hours for 5 days.   sertraline 100 MG tablet Commonly known as: ZOLOFT TAKE 1 TABLET BY MOUTH TWICE A DAY What changed: when to take this   Tivicay 50 MG tablet Generic drug: dolutegravir Take 1 tablet (50 mg total) by mouth daily.   Trulicity 1.5 WC/5.8NI Sopn Generic drug: Dulaglutide INJECT 1 PEN SUBCUTANEOUSLY ONCE A WEEK ON SUNDAY What changed:   how much  to take  how to take this  when to take this  additional instructions       Follow-up Information    Francisco Barrack, MD Follow up in 1 week(s).   Specialty: Family Medicine Contact information: [address] [place] [address] [phone]        Lelon Perla, MD .   Specialty: Cardiology Contact information: [address] [place] [address] [phone]              No Known Allergies  Consultations: Urology and cardiology   Procedures/Studies: CT Abdomen Pelvis W Contrast  Result Date: 08/01/2020 CLINICAL DATA:  Groin wound infection. EXAM: CT ABDOMEN AND PELVIS WITH CONTRAST TECHNIQUE: Multidetector CT imaging of the abdomen and pelvis was performed using the standard protocol following bolus administration of intravenous contrast. CONTRAST:  149m OMNIPAQUE IOHEXOL 300 MG/ML  SOLN COMPARISON:  April 23, 2020 FINDINGS: Lower chest: No acute abnormality. Hepatobiliary: No focal liver abnormality is seen. No gallstones, gallbladder wall thickening, or biliary dilatation. Pancreas: Numerous subcentimeter parenchymal calcifications are seen scattered throughout the pancreas. Spleen: Normal in size without focal abnormality.  Adrenals/Urinary Tract: Adrenal glands are unremarkable. Kidneys are normal, without renal calculi, focal lesion, or hydronephrosis. A Foley catheter is seen within a nearly empty urinary bladder. A mild amount of air is seen within the bladder lumen with moderate severity diffuse bladder wall thickening. Stomach/Bowel: Stomach is within normal limits. Appendix appears normal. No evidence of bowel wall thickening, distention, or inflammatory changes. Vascular/Lymphatic: No significant vascular findings are present. No enlarged abdominal or pelvic lymph nodes. Reproductive: Prostate is unremarkable. Other: There is a 3.5 cm x 2.2 cm fat containing right inguinal hernia. A 3.1 cm x 1.7 cm fat containing left inguinal hernia is also noted. A 1.9 cm thick, the elongated (approximately 8.0 cm) complex collection of fluid and air is seen adjacent to the dorsal aspect of the corpora cavernosa of the base of the penis. A thin, surrounding hyperdense rim is present with associated moderate severity adjacent inflammatory fat stranding. Musculoskeletal: A large soft tissue defect is seen along the lateral aspect of the left hip, with findings suggestive of prior irrigation and debridement since the prior study. Predominantly stable heterogeneous sclerotic areas are seen involving predominantly the L1, L2, L3 and L4 vertebral bodies. IMPRESSION: 1. Findings consistent with an abscess adjacent to the dorsal aspect of the corpora cavernosa of the base of the penis, as described above. 2. Bilateral fat containing inguinal hernias. 3. Findings consistent with chronic pancreatitis. 4. Suspected interval wound irrigation and debridement along the lateral aspect of the left hip since the prior study. Electronically Signed   By: TVirgina NorfolkM.D.   On: 08/01/2020 22:23   IR [place]  Result Date: 08/06/2020 INDICATION: 50year old male with Fournier's gangrene requiring suprapubic urinary bladder divergence. EXAM:  ULTRASOUND AND fluoroscopic GUIDED SUPRAPUBIC CATHETER PLACEMENT COMPARISON:  CT abdomen pelvis from 08/01/2020 MEDICATIONS: The patient was receiving antibiotics as an inpatient which were continued through the procedure. ANESTHESIA/SEDATION: Moderate (conscious) sedation was employed during this procedure. A total of Versed 1 mg and Fentanyl 50 mcg was administered intravenously. Moderate Sedation Time: 16 minutes. The patient's level of consciousness and vital signs were monitored continuously by radiology nursing throughout the procedure under my direct supervision. CONTRAST:  None COMPLICATIONS: None immediate. PROCEDURE: The procedure, risks, benefits, and alternatives were explained to the patient. Questions regarding the procedure were encouraged and answered. The patient understands and consents to  the procedure. A timeout was performed prior to the initiation of the procedure. The patient was positioned supine on the fluoro table. Preprocedural imaging was obtained of the lower pelvis. The skin overlying the anterior aspect the lower pelvis was prepped and draped in usual sterile fashion. Under direct ultrasound guidance, a 22 gauge needle was utilized for the purposes of procedural planning after the overlying soft tissues were anesthetized with 1% lidocaine. Next, an 86 gauge trocar needle was advanced into the urinary bladder under direct ultrasound guidance. Ultrasound image was saved for procedural documentation purposes. A short Amplatz wire was coiled within the urinary bladder. Appropriate position was confirmed with fluoroscopy. The track was serially dilated ultimately allowing placement of a 14 French all-purpose drainage catheter with end coiled and locked within the urinary bladder. Contrast was injected to confirm placement within the urinary bladder. The catheter was connected to a gravity bag. The exit site of the catheter was secured within interrupted suture. A dressing was placed. The  patient tolerated the procedure well without immediate postprocedural complication. FINDINGS: Small, thick walled urinary bladder with Foley in place. Ultrasound and fluoroscopy was utilized for the placement of a 14 French percutaneous drainage catheter with end coiled and locked within the urinary bladder. IMPRESSION: Technically successful ultrasound and fluoroscopic-guided placement of a 14 French suprapubic catheter. PLAN: Recommend fluoroscopic guided exchange and up sizing of this suprapubic drainage catheter in approximately 6-8 weeks or as per Urology. Ruthann Cancer, MD [facility] Radiology Electronically Signed   By: Ruthann Cancer MD   On: 08/06/2020 17:05   DG Chest Port 1 View  Result Date: 08/01/2020 CLINICAL DATA:  Sepsis. EXAM: PORTABLE CHEST 1 VIEW COMPARISON:  04/24/2020 FINDINGS: The heart size remains enlarged. There is no focal infiltrate. No large pleural effusion. No pneumothorax. There is no acute osseous abnormality. IMPRESSION: No active disease. Electronically Signed   By: Constance Holster M.D.   On: 08/01/2020 21:02   ECHOCARDIOGRAM COMPLETE  Result Date: 08/04/2020    ECHOCARDIOGRAM REPORT   Patient Name:   Francisco Hutchinson [facility] Date of Exam: 08/04/2020 Medical Rec #:  [id number]           Height:       68.0 in Accession #:    [id number]          Weight:       196.4 lb Date of Birth:  1970-08-26           BSA:          2.028 m Patient Age:    50 years            BP:           98/74 mmHg Patient Gender: M                   HR:           87 bpm. Exam Location:  Inpatient Procedure: 2D Echo Indications:    atrial fibrillation  History:        Patient has no prior history of Echocardiogram examinations.                 Chronic kidney disease. HIV., Arrythmias:Atrial Fibrillation;                 Risk Factors:Dyslipidemia and Diabetes.  Sonographer:    Johny Chess Referring Phys: [id number] [place]  1. Left  ventricular ejection fraction, by estimation, is 40 to  45%. The left ventricle has mildly decreased function. The left ventricle demonstrates global hypokinesis. Left ventricular diastolic function could not be evaluated.  2. Right ventricular systolic function is mildly reduced. The right ventricular size is mildly enlarged. There is mildly elevated pulmonary artery systolic pressure. The estimated right ventricular systolic pressure is 13.2 mmHg.  3. Left atrial size was mildly dilated.  4. The mitral valve is grossly normal. Trivial mitral valve regurgitation. No evidence of mitral stenosis.  5. The aortic valve is tricuspid. Aortic valve regurgitation is not visualized. No aortic stenosis is present.  6. The inferior vena cava is dilated in size with <50% respiratory variability, suggesting right atrial pressure of 15 mmHg. FINDINGS  Left Ventricle: Left ventricular ejection fraction, by estimation, is 40 to 45%. The left ventricle has mildly decreased function. The left ventricle demonstrates global hypokinesis. The left ventricular internal cavity size was normal in size. There is  no left ventricular hypertrophy. Left ventricular diastolic function could not be evaluated due to atrial fibrillation. Left ventricular diastolic function could not be evaluated. Right Ventricle: The right ventricular size is mildly enlarged. No increase in right ventricular wall thickness. Right ventricular systolic function is mildly reduced. There is mildly elevated pulmonary artery systolic pressure. The tricuspid regurgitant  velocity is 2.62 m/s, and with an assumed right atrial pressure of 15 mmHg, the estimated right ventricular systolic pressure is 44.0 mmHg. Left Atrium: Left atrial size was mildly dilated. Right Atrium: Right atrial size was normal in size. Pericardium: Trivial pericardial effusion is present. Mitral Valve: The mitral valve is grossly normal. Trivial mitral valve regurgitation. No evidence of mitral  valve stenosis. Tricuspid Valve: The tricuspid valve is grossly normal. Tricuspid valve regurgitation is mild . No evidence of tricuspid stenosis. Aortic Valve: The aortic valve is tricuspid. Aortic valve regurgitation is not visualized. No aortic stenosis is present. Pulmonic Valve: The pulmonic valve was grossly normal. Pulmonic valve regurgitation is trivial. No evidence of pulmonic stenosis. Aorta: The aortic root and ascending aorta are structurally normal, with no evidence of dilitation. Venous: The inferior vena cava is dilated in size with less than 50% respiratory variability, suggesting right atrial pressure of 15 mmHg. IAS/Shunts: The atrial septum is grossly normal.  LEFT VENTRICLE PLAX 2D LVIDd:         5.10 cm LVIDs:         4.30 cm LV PW:         1.10 cm LV IVS:        0.90 cm LVOT diam:     2.30 cm LV SV:         37 LV SV Index:   18 LVOT Area:     4.15 cm  RIGHT VENTRICLE            IVC RV S prime:     9.46 cm/s  IVC diam: 2.50 cm TAPSE (M-mode): 1.5 cm LEFT ATRIUM             Index       RIGHT ATRIUM           Index LA diam:        5.10 cm 2.51 cm/m  RA Area:     17.60 cm LA Vol (A2C):   66.1 ml 32.59 ml/m RA Volume:   44.00 ml  21.69 ml/m LA Vol (A4C):   76.9 ml 37.92 ml/m LA Biplane Vol: 75.2 ml 37.08 ml/m  AORTIC VALVE LVOT Vmax:   55.50 cm/s LVOT Vmean:  35.900 cm/s LVOT VTI:    0.088 m  AORTA Ao Root diam: 3.50 cm Ao Asc diam:  3.60 cm TRICUSPID VALVE TR Peak grad:   27.5 mmHg TR Vmax:        262.00 cm/s  SHUNTS Systemic VTI:  0.09 m Systemic Diam: 2.30 cm [name] Electronically signed by [name] Signature Date/Time: 08/04/2020/5:22:46 PM    Final       Discharge Exam: Vitals:   08/15/20 0445 08/15/20 0759  BP: (!) 122/101   Pulse: (!) 112   Resp: 18   Temp: 98 F (36.7 C)   SpO2: 98% 97%   Vitals:   08/14/20 2116 08/15/20 0352 08/15/20 0445 08/15/20 0759  BP: (!) 133/92  (!) 122/101   Pulse: 74 (!) 48 (!) 112   Resp: 17  18   Temp: 98.4 F (36.9  C)  98 F (36.7 C)   TempSrc: Oral  Oral   SpO2: 98% 96% 98% 97%  Weight:  89.7 kg    Height:        General: Pt is alert, awake, not in acute distress Cardiovascular: RRR, S1/S2 +, no rubs, no gallops Respiratory: CTA bilaterally, no wheezing, no rhonchi Abdominal: Soft, NT, ND, bowel sounds + Extremities: no edema, no cyanosis    The results of significant diagnostics from this hospitalization (including imaging, microbiology, ancillary and laboratory) are listed below for reference.     Microbiology: No results found for this or any previous visit (from the past 240 hour(s)).   Labs: BNP (last 3 results) No results for input(s): BNP in the last 8760 hours. Basic Metabolic Panel: Recent Labs  Lab 08/10/20 0122 08/11/20 0407 08/11/20 2351 08/13/20 0146 08/14/20 0345 08/15/20 0402  NA 142 141 142 138 141 139  K 5.5* 5.6* 5.4* 5.6* 5.8* 5.9*  CL 112* 112* 115* 114* 113* 110  CO2 22 21* 21* 18* 23 20*  GLUCOSE 193* 163* 98 162* 233* 152*  BUN 24* 24* 22* 21* 22* 21*  CREATININE 1.33* 1.19 1.21 1.17 1.28* 1.10  CALCIUM 7.7* 7.9* 7.8* 7.9* 7.8* 8.0*  MG 2.1 2.2 2.2 2.1 2.2  --   PHOS 3.2 3.4 3.3 3.8 4.1  --    Liver Function Tests: Recent Labs  Lab 08/10/20 0122 08/11/20 0407 08/11/20 2351 08/13/20 0146 08/14/20 0345  AST _0 ALT _1 ALKPHOS 87 92 82 95 86  BILITOT 0.4 0.3 0.4 0.7 0.5  PROT 6.0* 6.0* 6.0* 6.2* 5.7*  ALBUMIN 1.6* 1.6* 1.7* 1.7* 1.6*   No results for input(s): LIPASE, AMYLASE in the last 168 hours. No results for input(s): AMMONIA in the last 168 hours. CBC: Recent Labs  Lab 08/10/20 0122 08/11/20 0407 08/11/20 2351 08/13/20 0146 08/14/20 0345  WBC 14.4* 10.3 9.5 10.0 9.3  NEUTROABS 12.3* 8.3* 7.3 8.1* 7.1  HGB 8.8* 9.1* 9.8* 10.6* 9.1*  HCT 27.8* 30.1* 30.5* 33.9* 30.9*  MCV 90.8 92.9 91.3 93.1 96.0  PLT 318 357 344 312 290   Cardiac Enzymes: No results for input(s): CKTOTAL, CKMB, CKMBINDEX, TROPONINI  in the last 168 hours. BNP: Invalid input(s): POCBNP CBG: Recent Labs  Lab 08/14/20 1150 08/14/20 1616 08/14/20 2115 08/15/20 0729 08/15/20 1141  GLUCAP 214* 141* 133* 143* 159*   D-Dimer No results for input(s): DDIMER in the last 72 hours. Hgb A1c No results for input(s): HGBA1C in the last 72 hours. Lipid Profile No results for input(s): CHOL,  HDL, LDLCALC, TRIG, CHOLHDL, LDLDIRECT in the last 72 hours. Thyroid function studies No results for input(s): TSH, T4TOTAL, T3FREE, THYROIDAB in the last 72 hours.  Invalid input(s): FREET3 Anemia work up No results for input(s): VITAMINB12, FOLATE, FERRITIN, TIBC, IRON, RETICCTPCT in the last 72 hours. Urinalysis    Component Value Date/Time   COLORURINE AMBER (A) 01/18/2018 2200   APPEARANCEUR CLOUDY (A) 01/18/2018 2200   LABSPEC 1.021 01/18/2018 2200   PHURINE 5.0 01/18/2018 2200   GLUCOSEU 50 (A) 01/18/2018 2200   HGBUR MODERATE (A) 01/18/2018 2200   BILIRUBINUR Negative 03/11/2018 1654   KETONESUR NEGATIVE 01/18/2018 2200   PROTEINUR Positive (A) 03/11/2018 1654   PROTEINUR 100 (A) 01/18/2018 2200   UROBILINOGEN 0.2 03/11/2018 1654   NITRITE Negative 03/11/2018 1654   NITRITE NEGATIVE 01/18/2018 2200   LEUKOCYTESUR Negative 03/11/2018 1654   Sepsis Labs Invalid input(s): PROCALCITONIN,  WBC,  LACTICIDVEN Microbiology No results found for this or any previous visit (from the past 240 hour(s)).   Time coordinating discharge: Over 30 minutes  SIGNED:   Darliss Cheney, MD  [facility] 08/15/2020, 12:41 PM  If 7PM-7AM, please contact night-coverage [email or web address]

## 2020-08-15 NOTE — Discharge Instructions (Signed)

## 2020-08-15 NOTE — Progress Notes (Signed)
RN received call from [facility] with [facility] stating there were no transpiration availabilities at this time through [facility] or [facility]. Shawn stated [facility] would contact [facility] as soon as transportation was available.

## 2020-08-16 DIAGNOSIS — E46 Unspecified protein-calorie malnutrition: Secondary | ICD-10-CM | POA: Diagnosis not present

## 2020-08-16 DIAGNOSIS — I480 Paroxysmal atrial fibrillation: Secondary | ICD-10-CM | POA: Diagnosis not present

## 2020-08-16 DIAGNOSIS — R69 Illness, unspecified: Secondary | ICD-10-CM | POA: Diagnosis not present

## 2020-08-16 DIAGNOSIS — T8149XA Infection following a procedure, other surgical site, initial encounter: Secondary | ICD-10-CM | POA: Diagnosis not present

## 2020-08-16 DIAGNOSIS — I96 Gangrene, not elsewhere classified: Secondary | ICD-10-CM | POA: Diagnosis not present

## 2020-08-16 DIAGNOSIS — K429 Umbilical hernia without obstruction or gangrene: Secondary | ICD-10-CM | POA: Diagnosis not present

## 2020-08-16 DIAGNOSIS — T8143XA Infection following a procedure, organ and space surgical site, initial encounter: Secondary | ICD-10-CM | POA: Diagnosis not present

## 2020-08-16 DIAGNOSIS — I420 Dilated cardiomyopathy: Secondary | ICD-10-CM | POA: Diagnosis not present

## 2020-08-16 DIAGNOSIS — E119 Type 2 diabetes mellitus without complications: Secondary | ICD-10-CM | POA: Diagnosis not present

## 2020-08-16 DIAGNOSIS — E1122 Type 2 diabetes mellitus with diabetic chronic kidney disease: Secondary | ICD-10-CM | POA: Diagnosis not present

## 2020-08-16 DIAGNOSIS — M7989 Other specified soft tissue disorders: Secondary | ICD-10-CM | POA: Diagnosis not present

## 2020-08-16 DIAGNOSIS — E785 Hyperlipidemia, unspecified: Secondary | ICD-10-CM | POA: Diagnosis not present

## 2020-08-16 DIAGNOSIS — B951 Streptococcus, group B, as the cause of diseases classified elsewhere: Secondary | ICD-10-CM | POA: Diagnosis not present

## 2020-08-16 DIAGNOSIS — N492 Inflammatory disorders of scrotum: Secondary | ICD-10-CM | POA: Diagnosis not present

## 2020-08-16 DIAGNOSIS — Y838 Other surgical procedures as the cause of abnormal reaction of the patient, or of later complication, without mention of misadventure at the time of the procedure: Secondary | ICD-10-CM | POA: Diagnosis not present

## 2020-08-16 DIAGNOSIS — Z1621 Resistance to vancomycin: Secondary | ICD-10-CM | POA: Diagnosis not present

## 2020-08-16 DIAGNOSIS — E1165 Type 2 diabetes mellitus with hyperglycemia: Secondary | ICD-10-CM | POA: Diagnosis not present

## 2020-08-16 DIAGNOSIS — E875 Hyperkalemia: Secondary | ICD-10-CM | POA: Diagnosis not present

## 2020-08-16 DIAGNOSIS — I4891 Unspecified atrial fibrillation: Secondary | ICD-10-CM | POA: Diagnosis not present

## 2020-08-16 DIAGNOSIS — E872 Acidosis: Secondary | ICD-10-CM | POA: Diagnosis not present

## 2020-08-16 DIAGNOSIS — B964 Proteus (mirabilis) (morganii) as the cause of diseases classified elsewhere: Secondary | ICD-10-CM | POA: Diagnosis not present

## 2020-08-16 DIAGNOSIS — N493 Fournier gangrene: Secondary | ICD-10-CM | POA: Diagnosis not present

## 2020-08-16 DIAGNOSIS — B952 Enterococcus as the cause of diseases classified elsewhere: Secondary | ICD-10-CM | POA: Diagnosis not present

## 2020-08-16 DIAGNOSIS — I10 Essential (primary) hypertension: Secondary | ICD-10-CM | POA: Diagnosis not present

## 2020-08-16 DIAGNOSIS — L89224 Pressure ulcer of left hip, stage 4: Secondary | ICD-10-CM | POA: Diagnosis not present

## 2020-08-16 LAB — CD4/CD8 (T-HELPER/T-SUPPRESSOR CELL)
CD4 absolute: 519 /uL (ref 400–1790)
CD4%: 41 % (ref 33–65)
CD8 T Cell Abs: 497 /uL (ref 190–1000)
CD8tox: 40 % (ref 12–40)
Ratio: 1.04 (ref 1.0–3.0)
Total lymphocyte count: 1260 /uL (ref 1000–4000)

## 2020-08-17 DIAGNOSIS — E875 Hyperkalemia: Secondary | ICD-10-CM | POA: Diagnosis not present

## 2020-08-17 DIAGNOSIS — R69 Illness, unspecified: Secondary | ICD-10-CM | POA: Diagnosis not present

## 2020-08-17 DIAGNOSIS — I420 Dilated cardiomyopathy: Secondary | ICD-10-CM | POA: Diagnosis not present

## 2020-08-17 DIAGNOSIS — I10 Essential (primary) hypertension: Secondary | ICD-10-CM | POA: Diagnosis not present

## 2020-08-17 DIAGNOSIS — E119 Type 2 diabetes mellitus without complications: Secondary | ICD-10-CM | POA: Diagnosis not present

## 2020-08-17 DIAGNOSIS — I4891 Unspecified atrial fibrillation: Secondary | ICD-10-CM | POA: Diagnosis not present

## 2020-08-18 DIAGNOSIS — I420 Dilated cardiomyopathy: Secondary | ICD-10-CM | POA: Diagnosis not present

## 2020-08-18 DIAGNOSIS — R69 Illness, unspecified: Secondary | ICD-10-CM | POA: Diagnosis not present

## 2020-08-18 DIAGNOSIS — I4891 Unspecified atrial fibrillation: Secondary | ICD-10-CM | POA: Diagnosis not present

## 2020-08-18 DIAGNOSIS — E875 Hyperkalemia: Secondary | ICD-10-CM | POA: Diagnosis not present

## 2020-08-18 DIAGNOSIS — E119 Type 2 diabetes mellitus without complications: Secondary | ICD-10-CM | POA: Diagnosis not present

## 2020-08-19 DIAGNOSIS — I420 Dilated cardiomyopathy: Secondary | ICD-10-CM | POA: Diagnosis not present

## 2020-08-19 DIAGNOSIS — E875 Hyperkalemia: Secondary | ICD-10-CM | POA: Diagnosis not present

## 2020-08-19 DIAGNOSIS — R69 Illness, unspecified: Secondary | ICD-10-CM | POA: Diagnosis not present

## 2020-08-19 DIAGNOSIS — E119 Type 2 diabetes mellitus without complications: Secondary | ICD-10-CM | POA: Diagnosis not present

## 2020-08-19 DIAGNOSIS — I4891 Unspecified atrial fibrillation: Secondary | ICD-10-CM | POA: Diagnosis not present

## 2020-08-20 DIAGNOSIS — I4891 Unspecified atrial fibrillation: Secondary | ICD-10-CM | POA: Diagnosis not present

## 2020-08-20 DIAGNOSIS — E119 Type 2 diabetes mellitus without complications: Secondary | ICD-10-CM | POA: Diagnosis not present

## 2020-08-20 DIAGNOSIS — E875 Hyperkalemia: Secondary | ICD-10-CM | POA: Diagnosis not present

## 2020-08-20 DIAGNOSIS — I420 Dilated cardiomyopathy: Secondary | ICD-10-CM | POA: Diagnosis not present

## 2020-08-20 DIAGNOSIS — R69 Illness, unspecified: Secondary | ICD-10-CM | POA: Diagnosis not present

## 2020-08-21 DIAGNOSIS — R69 Illness, unspecified: Secondary | ICD-10-CM | POA: Diagnosis not present

## 2020-08-21 DIAGNOSIS — E119 Type 2 diabetes mellitus without complications: Secondary | ICD-10-CM | POA: Diagnosis not present

## 2020-08-21 DIAGNOSIS — E875 Hyperkalemia: Secondary | ICD-10-CM | POA: Diagnosis not present

## 2020-08-21 DIAGNOSIS — I420 Dilated cardiomyopathy: Secondary | ICD-10-CM | POA: Diagnosis not present

## 2020-08-21 DIAGNOSIS — I4891 Unspecified atrial fibrillation: Secondary | ICD-10-CM | POA: Diagnosis not present

## 2020-08-22 DIAGNOSIS — N493 Fournier gangrene: Secondary | ICD-10-CM | POA: Diagnosis not present

## 2020-08-22 DIAGNOSIS — E875 Hyperkalemia: Secondary | ICD-10-CM | POA: Diagnosis not present

## 2020-08-22 DIAGNOSIS — E1165 Type 2 diabetes mellitus with hyperglycemia: Secondary | ICD-10-CM | POA: Diagnosis not present

## 2020-08-22 DIAGNOSIS — I420 Dilated cardiomyopathy: Secondary | ICD-10-CM | POA: Diagnosis not present

## 2020-08-22 DIAGNOSIS — K429 Umbilical hernia without obstruction or gangrene: Secondary | ICD-10-CM | POA: Diagnosis not present

## 2020-08-22 DIAGNOSIS — R69 Illness, unspecified: Secondary | ICD-10-CM | POA: Diagnosis not present

## 2020-08-22 DIAGNOSIS — M7989 Other specified soft tissue disorders: Secondary | ICD-10-CM | POA: Diagnosis not present

## 2020-08-22 DIAGNOSIS — I4891 Unspecified atrial fibrillation: Secondary | ICD-10-CM | POA: Diagnosis not present

## 2020-08-23 DIAGNOSIS — E1165 Type 2 diabetes mellitus with hyperglycemia: Secondary | ICD-10-CM | POA: Diagnosis not present

## 2020-08-23 DIAGNOSIS — I4891 Unspecified atrial fibrillation: Secondary | ICD-10-CM | POA: Diagnosis not present

## 2020-08-23 DIAGNOSIS — I420 Dilated cardiomyopathy: Secondary | ICD-10-CM | POA: Diagnosis not present

## 2020-08-23 DIAGNOSIS — R69 Illness, unspecified: Secondary | ICD-10-CM | POA: Diagnosis not present

## 2020-08-24 DIAGNOSIS — I420 Dilated cardiomyopathy: Secondary | ICD-10-CM | POA: Diagnosis not present

## 2020-08-24 DIAGNOSIS — T8149XA Infection following a procedure, other surgical site, initial encounter: Secondary | ICD-10-CM | POA: Diagnosis not present

## 2020-08-24 DIAGNOSIS — I4891 Unspecified atrial fibrillation: Secondary | ICD-10-CM | POA: Diagnosis not present

## 2020-08-24 DIAGNOSIS — N493 Fournier gangrene: Secondary | ICD-10-CM | POA: Diagnosis not present

## 2020-08-24 DIAGNOSIS — E1165 Type 2 diabetes mellitus with hyperglycemia: Secondary | ICD-10-CM | POA: Diagnosis not present

## 2020-08-24 DIAGNOSIS — B964 Proteus (mirabilis) (morganii) as the cause of diseases classified elsewhere: Secondary | ICD-10-CM | POA: Diagnosis not present

## 2020-08-24 DIAGNOSIS — E872 Acidosis: Secondary | ICD-10-CM | POA: Diagnosis not present

## 2020-08-24 DIAGNOSIS — R69 Illness, unspecified: Secondary | ICD-10-CM | POA: Diagnosis not present

## 2020-08-24 DIAGNOSIS — B951 Streptococcus, group B, as the cause of diseases classified elsewhere: Secondary | ICD-10-CM | POA: Diagnosis not present

## 2020-08-24 DIAGNOSIS — E875 Hyperkalemia: Secondary | ICD-10-CM | POA: Diagnosis not present

## 2020-08-25 DIAGNOSIS — B951 Streptococcus, group B, as the cause of diseases classified elsewhere: Secondary | ICD-10-CM | POA: Diagnosis not present

## 2020-08-25 DIAGNOSIS — R69 Illness, unspecified: Secondary | ICD-10-CM | POA: Diagnosis not present

## 2020-08-25 DIAGNOSIS — B964 Proteus (mirabilis) (morganii) as the cause of diseases classified elsewhere: Secondary | ICD-10-CM | POA: Diagnosis not present

## 2020-08-25 DIAGNOSIS — N493 Fournier gangrene: Secondary | ICD-10-CM | POA: Diagnosis not present

## 2020-08-26 DIAGNOSIS — R69 Illness, unspecified: Secondary | ICD-10-CM | POA: Diagnosis not present

## 2020-08-26 DIAGNOSIS — I420 Dilated cardiomyopathy: Secondary | ICD-10-CM | POA: Diagnosis not present

## 2020-08-26 DIAGNOSIS — I4891 Unspecified atrial fibrillation: Secondary | ICD-10-CM | POA: Diagnosis not present

## 2020-08-26 DIAGNOSIS — E875 Hyperkalemia: Secondary | ICD-10-CM | POA: Diagnosis not present

## 2020-08-26 DIAGNOSIS — E872 Acidosis: Secondary | ICD-10-CM | POA: Diagnosis not present

## 2020-08-26 DIAGNOSIS — E1165 Type 2 diabetes mellitus with hyperglycemia: Secondary | ICD-10-CM | POA: Diagnosis not present

## 2020-08-27 DIAGNOSIS — E785 Hyperlipidemia, unspecified: Secondary | ICD-10-CM | POA: Diagnosis not present

## 2020-08-27 DIAGNOSIS — I420 Dilated cardiomyopathy: Secondary | ICD-10-CM | POA: Diagnosis not present

## 2020-08-27 DIAGNOSIS — E872 Acidosis: Secondary | ICD-10-CM | POA: Diagnosis not present

## 2020-08-27 DIAGNOSIS — E1165 Type 2 diabetes mellitus with hyperglycemia: Secondary | ICD-10-CM | POA: Diagnosis not present

## 2020-08-27 DIAGNOSIS — R69 Illness, unspecified: Secondary | ICD-10-CM | POA: Diagnosis not present

## 2020-08-27 DIAGNOSIS — I4891 Unspecified atrial fibrillation: Secondary | ICD-10-CM | POA: Diagnosis not present

## 2020-08-27 DIAGNOSIS — E46 Unspecified protein-calorie malnutrition: Secondary | ICD-10-CM | POA: Diagnosis not present

## 2020-08-27 DIAGNOSIS — I96 Gangrene, not elsewhere classified: Secondary | ICD-10-CM | POA: Diagnosis not present

## 2020-08-27 DIAGNOSIS — I10 Essential (primary) hypertension: Secondary | ICD-10-CM | POA: Diagnosis not present

## 2020-08-28 DIAGNOSIS — E872 Acidosis: Secondary | ICD-10-CM | POA: Diagnosis not present

## 2020-08-28 DIAGNOSIS — E1165 Type 2 diabetes mellitus with hyperglycemia: Secondary | ICD-10-CM | POA: Diagnosis not present

## 2020-08-28 DIAGNOSIS — I420 Dilated cardiomyopathy: Secondary | ICD-10-CM | POA: Diagnosis not present

## 2020-08-28 DIAGNOSIS — I4891 Unspecified atrial fibrillation: Secondary | ICD-10-CM | POA: Diagnosis not present

## 2020-08-28 DIAGNOSIS — N493 Fournier gangrene: Secondary | ICD-10-CM | POA: Diagnosis not present

## 2020-08-28 DIAGNOSIS — R69 Illness, unspecified: Secondary | ICD-10-CM | POA: Diagnosis not present

## 2020-08-28 DIAGNOSIS — Z1621 Resistance to vancomycin: Secondary | ICD-10-CM | POA: Diagnosis not present

## 2020-08-29 DIAGNOSIS — N493 Fournier gangrene: Secondary | ICD-10-CM | POA: Diagnosis not present

## 2020-08-29 DIAGNOSIS — I480 Paroxysmal atrial fibrillation: Secondary | ICD-10-CM | POA: Diagnosis not present

## 2020-08-29 DIAGNOSIS — I420 Dilated cardiomyopathy: Secondary | ICD-10-CM | POA: Diagnosis not present

## 2020-08-29 DIAGNOSIS — E1165 Type 2 diabetes mellitus with hyperglycemia: Secondary | ICD-10-CM | POA: Diagnosis not present

## 2020-08-29 DIAGNOSIS — E875 Hyperkalemia: Secondary | ICD-10-CM | POA: Diagnosis not present

## 2020-08-29 DIAGNOSIS — R69 Illness, unspecified: Secondary | ICD-10-CM | POA: Diagnosis not present

## 2020-08-30 DIAGNOSIS — Z48816 Encounter for surgical aftercare following surgery on the genitourinary system: Secondary | ICD-10-CM | POA: Diagnosis not present

## 2020-08-30 DIAGNOSIS — E113513 Type 2 diabetes mellitus with proliferative diabetic retinopathy with macular edema, bilateral: Secondary | ICD-10-CM | POA: Diagnosis not present

## 2020-08-30 DIAGNOSIS — N321 Vesicointestinal fistula: Secondary | ICD-10-CM | POA: Diagnosis not present

## 2020-08-30 DIAGNOSIS — L89320 Pressure ulcer of left buttock, unstageable: Secondary | ICD-10-CM | POA: Diagnosis not present

## 2020-08-30 DIAGNOSIS — R279 Unspecified lack of coordination: Secondary | ICD-10-CM | POA: Diagnosis not present

## 2020-08-30 DIAGNOSIS — R5381 Other malaise: Secondary | ICD-10-CM | POA: Diagnosis not present

## 2020-08-30 DIAGNOSIS — L732 Hidradenitis suppurativa: Secondary | ICD-10-CM | POA: Diagnosis not present

## 2020-08-30 DIAGNOSIS — Z09 Encounter for follow-up examination after completed treatment for conditions other than malignant neoplasm: Secondary | ICD-10-CM | POA: Diagnosis not present

## 2020-08-30 DIAGNOSIS — A499 Bacterial infection, unspecified: Secondary | ICD-10-CM | POA: Diagnosis not present

## 2020-08-30 DIAGNOSIS — U071 COVID-19: Secondary | ICD-10-CM | POA: Diagnosis not present

## 2020-08-30 DIAGNOSIS — N183 Chronic kidney disease, stage 3 unspecified: Secondary | ICD-10-CM | POA: Diagnosis not present

## 2020-08-30 DIAGNOSIS — E1122 Type 2 diabetes mellitus with diabetic chronic kidney disease: Secondary | ICD-10-CM | POA: Diagnosis not present

## 2020-08-30 DIAGNOSIS — I10 Essential (primary) hypertension: Secondary | ICD-10-CM | POA: Diagnosis not present

## 2020-08-30 DIAGNOSIS — M6281 Muscle weakness (generalized): Secondary | ICD-10-CM | POA: Diagnosis not present

## 2020-08-30 DIAGNOSIS — N493 Fournier gangrene: Secondary | ICD-10-CM | POA: Diagnosis not present

## 2020-08-30 DIAGNOSIS — D649 Anemia, unspecified: Secondary | ICD-10-CM | POA: Diagnosis not present

## 2020-09-06 DIAGNOSIS — L732 Hidradenitis suppurativa: Secondary | ICD-10-CM | POA: Diagnosis not present

## 2020-09-06 DIAGNOSIS — N493 Fournier gangrene: Secondary | ICD-10-CM | POA: Diagnosis not present

## 2020-09-06 DIAGNOSIS — Z09 Encounter for follow-up examination after completed treatment for conditions other than malignant neoplasm: Secondary | ICD-10-CM | POA: Diagnosis not present

## 2020-09-18 ENCOUNTER — Ambulatory Visit: Payer: Medicare Other | Admitting: Internal Medicine

## 2020-09-25 DIAGNOSIS — L89153 Pressure ulcer of sacral region, stage 3: Secondary | ICD-10-CM | POA: Diagnosis not present

## 2020-09-25 DIAGNOSIS — L89213 Pressure ulcer of right hip, stage 3: Secondary | ICD-10-CM | POA: Diagnosis not present

## 2020-09-26 DIAGNOSIS — R059 Cough, unspecified: Secondary | ICD-10-CM | POA: Diagnosis not present

## 2020-09-26 DIAGNOSIS — I420 Dilated cardiomyopathy: Secondary | ICD-10-CM | POA: Diagnosis not present

## 2020-09-26 DIAGNOSIS — L89224 Pressure ulcer of left hip, stage 4: Secondary | ICD-10-CM | POA: Diagnosis not present

## 2020-09-26 DIAGNOSIS — E11628 Type 2 diabetes mellitus with other skin complications: Secondary | ICD-10-CM | POA: Diagnosis not present

## 2020-09-26 DIAGNOSIS — Z4889 Encounter for other specified surgical aftercare: Secondary | ICD-10-CM | POA: Diagnosis not present

## 2020-09-26 DIAGNOSIS — I10 Essential (primary) hypertension: Secondary | ICD-10-CM | POA: Diagnosis not present

## 2020-09-26 DIAGNOSIS — Y838 Other surgical procedures as the cause of abnormal reaction of the patient, or of later complication, without mention of misadventure at the time of the procedure: Secondary | ICD-10-CM | POA: Diagnosis not present

## 2020-09-26 DIAGNOSIS — N4829 Other inflammatory disorders of penis: Secondary | ICD-10-CM | POA: Diagnosis not present

## 2020-09-26 DIAGNOSIS — U071 COVID-19: Secondary | ICD-10-CM | POA: Diagnosis not present

## 2020-09-26 DIAGNOSIS — D6859 Other primary thrombophilia: Secondary | ICD-10-CM | POA: Diagnosis not present

## 2020-09-26 DIAGNOSIS — N493 Fournier gangrene: Secondary | ICD-10-CM | POA: Diagnosis not present

## 2020-09-26 DIAGNOSIS — I5020 Unspecified systolic (congestive) heart failure: Secondary | ICD-10-CM | POA: Diagnosis not present

## 2020-09-26 DIAGNOSIS — I4891 Unspecified atrial fibrillation: Secondary | ICD-10-CM | POA: Diagnosis not present

## 2020-09-26 DIAGNOSIS — I96 Gangrene, not elsewhere classified: Secondary | ICD-10-CM | POA: Diagnosis not present

## 2020-09-26 DIAGNOSIS — T8131XA Disruption of external operation (surgical) wound, not elsewhere classified, initial encounter: Secondary | ICD-10-CM | POA: Diagnosis not present

## 2020-09-26 DIAGNOSIS — L03314 Cellulitis of groin: Secondary | ICD-10-CM | POA: Diagnosis not present

## 2020-09-26 DIAGNOSIS — R69 Illness, unspecified: Secondary | ICD-10-CM | POA: Diagnosis not present

## 2020-09-26 DIAGNOSIS — N4821 Abscess of corpus cavernosum and penis: Secondary | ICD-10-CM | POA: Diagnosis not present

## 2020-09-26 DIAGNOSIS — I130 Hypertensive heart and chronic kidney disease with heart failure and stage 1 through stage 4 chronic kidney disease, or unspecified chronic kidney disease: Secondary | ICD-10-CM | POA: Diagnosis not present

## 2020-09-26 DIAGNOSIS — E119 Type 2 diabetes mellitus without complications: Secondary | ICD-10-CM | POA: Diagnosis not present

## 2020-09-26 DIAGNOSIS — E43 Unspecified severe protein-calorie malnutrition: Secondary | ICD-10-CM | POA: Diagnosis not present

## 2020-09-28 DIAGNOSIS — I96 Gangrene, not elsewhere classified: Secondary | ICD-10-CM | POA: Diagnosis not present

## 2020-09-28 DIAGNOSIS — R69 Illness, unspecified: Secondary | ICD-10-CM | POA: Diagnosis not present

## 2020-09-28 DIAGNOSIS — U071 COVID-19: Secondary | ICD-10-CM | POA: Diagnosis not present

## 2020-09-28 DIAGNOSIS — N4821 Abscess of corpus cavernosum and penis: Secondary | ICD-10-CM | POA: Diagnosis not present

## 2020-09-28 DIAGNOSIS — N4829 Other inflammatory disorders of penis: Secondary | ICD-10-CM | POA: Diagnosis not present

## 2020-10-02 DIAGNOSIS — A499 Bacterial infection, unspecified: Secondary | ICD-10-CM | POA: Diagnosis not present

## 2020-10-02 DIAGNOSIS — M6281 Muscle weakness (generalized): Secondary | ICD-10-CM | POA: Diagnosis not present

## 2020-10-02 DIAGNOSIS — R5381 Other malaise: Secondary | ICD-10-CM | POA: Diagnosis not present

## 2020-10-02 DIAGNOSIS — R279 Unspecified lack of coordination: Secondary | ICD-10-CM | POA: Diagnosis not present

## 2020-10-18 DIAGNOSIS — N493 Fournier gangrene: Secondary | ICD-10-CM | POA: Diagnosis not present

## 2020-10-18 DIAGNOSIS — I998 Other disorder of circulatory system: Secondary | ICD-10-CM | POA: Diagnosis not present

## 2020-10-18 DIAGNOSIS — Z4889 Encounter for other specified surgical aftercare: Secondary | ICD-10-CM | POA: Diagnosis not present

## 2020-10-19 DIAGNOSIS — L89213 Pressure ulcer of right hip, stage 3: Secondary | ICD-10-CM | POA: Diagnosis not present

## 2020-10-19 DIAGNOSIS — R339 Retention of urine, unspecified: Secondary | ICD-10-CM | POA: Diagnosis not present

## 2020-10-19 DIAGNOSIS — L89620 Pressure ulcer of left heel, unstageable: Secondary | ICD-10-CM | POA: Diagnosis not present

## 2020-10-19 DIAGNOSIS — T8189XD Other complications of procedures, not elsewhere classified, subsequent encounter: Secondary | ICD-10-CM | POA: Diagnosis not present

## 2020-10-19 DIAGNOSIS — L89153 Pressure ulcer of sacral region, stage 3: Secondary | ICD-10-CM | POA: Diagnosis not present

## 2020-11-01 DIAGNOSIS — E8809 Other disorders of plasma-protein metabolism, not elsewhere classified: Secondary | ICD-10-CM | POA: Diagnosis not present

## 2020-11-01 DIAGNOSIS — L02214 Cutaneous abscess of groin: Secondary | ICD-10-CM | POA: Diagnosis not present

## 2020-11-01 DIAGNOSIS — I5022 Chronic systolic (congestive) heart failure: Secondary | ICD-10-CM | POA: Diagnosis not present

## 2020-11-01 DIAGNOSIS — E1152 Type 2 diabetes mellitus with diabetic peripheral angiopathy with gangrene: Secondary | ICD-10-CM | POA: Diagnosis not present

## 2020-11-01 DIAGNOSIS — I130 Hypertensive heart and chronic kidney disease with heart failure and stage 1 through stage 4 chronic kidney disease, or unspecified chronic kidney disease: Secondary | ICD-10-CM | POA: Diagnosis not present

## 2020-11-01 DIAGNOSIS — L02215 Cutaneous abscess of perineum: Secondary | ICD-10-CM | POA: Diagnosis not present

## 2020-11-01 DIAGNOSIS — I4891 Unspecified atrial fibrillation: Secondary | ICD-10-CM | POA: Diagnosis not present

## 2020-11-01 DIAGNOSIS — L03314 Cellulitis of groin: Secondary | ICD-10-CM | POA: Diagnosis not present

## 2020-11-01 DIAGNOSIS — L89224 Pressure ulcer of left hip, stage 4: Secondary | ICD-10-CM | POA: Diagnosis not present

## 2020-11-01 DIAGNOSIS — I10 Essential (primary) hypertension: Secondary | ICD-10-CM | POA: Diagnosis not present

## 2020-11-01 DIAGNOSIS — R7989 Other specified abnormal findings of blood chemistry: Secondary | ICD-10-CM | POA: Diagnosis not present

## 2020-11-01 DIAGNOSIS — N493 Fournier gangrene: Secondary | ICD-10-CM | POA: Diagnosis not present

## 2020-11-01 DIAGNOSIS — R69 Illness, unspecified: Secondary | ICD-10-CM | POA: Diagnosis not present

## 2020-11-01 DIAGNOSIS — E43 Unspecified severe protein-calorie malnutrition: Secondary | ICD-10-CM | POA: Diagnosis not present

## 2020-11-01 DIAGNOSIS — N4829 Other inflammatory disorders of penis: Secondary | ICD-10-CM | POA: Diagnosis not present

## 2020-11-02 DIAGNOSIS — L03314 Cellulitis of groin: Secondary | ICD-10-CM | POA: Diagnosis not present

## 2020-11-02 DIAGNOSIS — L02215 Cutaneous abscess of perineum: Secondary | ICD-10-CM | POA: Diagnosis not present

## 2020-11-02 DIAGNOSIS — E1152 Type 2 diabetes mellitus with diabetic peripheral angiopathy with gangrene: Secondary | ICD-10-CM | POA: Diagnosis not present

## 2020-11-02 DIAGNOSIS — I10 Essential (primary) hypertension: Secondary | ICD-10-CM | POA: Diagnosis not present

## 2020-11-02 DIAGNOSIS — N493 Fournier gangrene: Secondary | ICD-10-CM | POA: Diagnosis not present

## 2020-11-02 DIAGNOSIS — N4829 Other inflammatory disorders of penis: Secondary | ICD-10-CM | POA: Diagnosis not present

## 2020-11-02 DIAGNOSIS — R7989 Other specified abnormal findings of blood chemistry: Secondary | ICD-10-CM | POA: Diagnosis not present

## 2020-11-02 DIAGNOSIS — I4891 Unspecified atrial fibrillation: Secondary | ICD-10-CM | POA: Diagnosis not present

## 2020-11-02 DIAGNOSIS — R69 Illness, unspecified: Secondary | ICD-10-CM | POA: Diagnosis not present

## 2020-11-03 DIAGNOSIS — N493 Fournier gangrene: Secondary | ICD-10-CM | POA: Diagnosis not present

## 2020-11-04 DIAGNOSIS — N493 Fournier gangrene: Secondary | ICD-10-CM | POA: Diagnosis not present

## 2020-11-07 DIAGNOSIS — B372 Candidiasis of skin and nail: Secondary | ICD-10-CM | POA: Diagnosis not present

## 2020-11-07 DIAGNOSIS — M79604 Pain in right leg: Secondary | ICD-10-CM | POA: Diagnosis not present

## 2020-11-07 DIAGNOSIS — L89223 Pressure ulcer of left hip, stage 3: Secondary | ICD-10-CM | POA: Diagnosis not present

## 2020-11-07 DIAGNOSIS — L89620 Pressure ulcer of left heel, unstageable: Secondary | ICD-10-CM | POA: Diagnosis not present

## 2020-11-07 DIAGNOSIS — N321 Vesicointestinal fistula: Secondary | ICD-10-CM | POA: Diagnosis not present

## 2020-11-07 DIAGNOSIS — Z09 Encounter for follow-up examination after completed treatment for conditions other than malignant neoplasm: Secondary | ICD-10-CM | POA: Diagnosis not present

## 2020-11-07 DIAGNOSIS — N319 Neuromuscular dysfunction of bladder, unspecified: Secondary | ICD-10-CM | POA: Diagnosis not present

## 2020-11-07 DIAGNOSIS — R69 Illness, unspecified: Secondary | ICD-10-CM | POA: Diagnosis not present

## 2020-11-07 DIAGNOSIS — Z48816 Encounter for surgical aftercare following surgery on the genitourinary system: Secondary | ICD-10-CM | POA: Diagnosis not present

## 2020-11-07 DIAGNOSIS — L89210 Pressure ulcer of right hip, unstageable: Secondary | ICD-10-CM | POA: Diagnosis not present

## 2020-11-07 DIAGNOSIS — E1122 Type 2 diabetes mellitus with diabetic chronic kidney disease: Secondary | ICD-10-CM | POA: Diagnosis not present

## 2020-11-07 DIAGNOSIS — E113513 Type 2 diabetes mellitus with proliferative diabetic retinopathy with macular edema, bilateral: Secondary | ICD-10-CM | POA: Diagnosis not present

## 2020-11-07 DIAGNOSIS — L89320 Pressure ulcer of left buttock, unstageable: Secondary | ICD-10-CM | POA: Diagnosis not present

## 2020-11-07 DIAGNOSIS — D649 Anemia, unspecified: Secondary | ICD-10-CM | POA: Diagnosis not present

## 2020-11-07 DIAGNOSIS — R339 Retention of urine, unspecified: Secondary | ICD-10-CM | POA: Diagnosis not present

## 2020-11-07 DIAGNOSIS — N493 Fournier gangrene: Secondary | ICD-10-CM | POA: Diagnosis not present

## 2020-11-07 DIAGNOSIS — N183 Chronic kidney disease, stage 3 unspecified: Secondary | ICD-10-CM | POA: Diagnosis not present

## 2020-11-07 DIAGNOSIS — Z743 Need for continuous supervision: Secondary | ICD-10-CM | POA: Diagnosis not present

## 2020-11-15 DIAGNOSIS — Z09 Encounter for follow-up examination after completed treatment for conditions other than malignant neoplasm: Secondary | ICD-10-CM | POA: Diagnosis not present

## 2020-11-15 DIAGNOSIS — B372 Candidiasis of skin and nail: Secondary | ICD-10-CM | POA: Diagnosis not present

## 2020-11-15 DIAGNOSIS — N493 Fournier gangrene: Secondary | ICD-10-CM | POA: Diagnosis not present

## 2020-11-15 DIAGNOSIS — R69 Illness, unspecified: Secondary | ICD-10-CM | POA: Diagnosis not present

## 2020-11-19 ENCOUNTER — Telehealth: Payer: Self-pay

## 2020-11-19 NOTE — Telephone Encounter (Signed)
error 

## 2020-11-20 DIAGNOSIS — R339 Retention of urine, unspecified: Secondary | ICD-10-CM | POA: Diagnosis not present

## 2020-11-22 DIAGNOSIS — N493 Fournier gangrene: Secondary | ICD-10-CM | POA: Diagnosis not present

## 2020-11-30 DIAGNOSIS — E08311 Diabetes mellitus due to underlying condition with unspecified diabetic retinopathy with macular edema: Secondary | ICD-10-CM | POA: Diagnosis not present

## 2020-11-30 DIAGNOSIS — I10 Essential (primary) hypertension: Secondary | ICD-10-CM | POA: Diagnosis not present

## 2020-12-06 DIAGNOSIS — L03314 Cellulitis of groin: Secondary | ICD-10-CM | POA: Diagnosis not present

## 2020-12-06 DIAGNOSIS — N493 Fournier gangrene: Secondary | ICD-10-CM | POA: Diagnosis not present

## 2020-12-17 DIAGNOSIS — I998 Other disorder of circulatory system: Secondary | ICD-10-CM | POA: Diagnosis not present

## 2020-12-18 DIAGNOSIS — R339 Retention of urine, unspecified: Secondary | ICD-10-CM | POA: Diagnosis not present

## 2020-12-18 DIAGNOSIS — L89223 Pressure ulcer of left hip, stage 3: Secondary | ICD-10-CM | POA: Diagnosis not present

## 2020-12-18 DIAGNOSIS — L89013 Pressure ulcer of right elbow, stage 3: Secondary | ICD-10-CM | POA: Diagnosis not present

## 2020-12-18 DIAGNOSIS — L89620 Pressure ulcer of left heel, unstageable: Secondary | ICD-10-CM | POA: Diagnosis not present

## 2020-12-18 DIAGNOSIS — T8189XD Other complications of procedures, not elsewhere classified, subsequent encounter: Secondary | ICD-10-CM | POA: Diagnosis not present

## 2020-12-27 DIAGNOSIS — E11621 Type 2 diabetes mellitus with foot ulcer: Secondary | ICD-10-CM | POA: Diagnosis not present

## 2020-12-27 DIAGNOSIS — L89013 Pressure ulcer of right elbow, stage 3: Secondary | ICD-10-CM | POA: Diagnosis not present

## 2020-12-27 DIAGNOSIS — L97422 Non-pressure chronic ulcer of left heel and midfoot with fat layer exposed: Secondary | ICD-10-CM | POA: Diagnosis not present

## 2020-12-27 DIAGNOSIS — L98492 Non-pressure chronic ulcer of skin of other sites with fat layer exposed: Secondary | ICD-10-CM | POA: Diagnosis not present

## 2020-12-28 DIAGNOSIS — E08311 Diabetes mellitus due to underlying condition with unspecified diabetic retinopathy with macular edema: Secondary | ICD-10-CM | POA: Diagnosis not present

## 2020-12-28 DIAGNOSIS — I10 Essential (primary) hypertension: Secondary | ICD-10-CM | POA: Diagnosis not present

## 2021-01-11 DIAGNOSIS — L89620 Pressure ulcer of left heel, unstageable: Secondary | ICD-10-CM | POA: Diagnosis not present

## 2021-01-11 DIAGNOSIS — L89013 Pressure ulcer of right elbow, stage 3: Secondary | ICD-10-CM | POA: Diagnosis not present

## 2021-01-11 DIAGNOSIS — L89223 Pressure ulcer of left hip, stage 3: Secondary | ICD-10-CM | POA: Diagnosis not present

## 2021-01-14 DIAGNOSIS — R339 Retention of urine, unspecified: Secondary | ICD-10-CM | POA: Diagnosis not present

## 2021-01-16 DIAGNOSIS — N493 Fournier gangrene: Secondary | ICD-10-CM | POA: Diagnosis not present

## 2021-01-31 DIAGNOSIS — Z743 Need for continuous supervision: Secondary | ICD-10-CM | POA: Diagnosis not present

## 2021-01-31 DIAGNOSIS — Z79899 Other long term (current) drug therapy: Secondary | ICD-10-CM | POA: Diagnosis not present

## 2021-01-31 DIAGNOSIS — E11621 Type 2 diabetes mellitus with foot ulcer: Secondary | ICD-10-CM | POA: Diagnosis not present

## 2021-01-31 DIAGNOSIS — Z794 Long term (current) use of insulin: Secondary | ICD-10-CM | POA: Diagnosis not present

## 2021-01-31 DIAGNOSIS — R531 Weakness: Secondary | ICD-10-CM | POA: Diagnosis not present

## 2021-01-31 DIAGNOSIS — R5381 Other malaise: Secondary | ICD-10-CM | POA: Diagnosis not present

## 2021-01-31 DIAGNOSIS — L98492 Non-pressure chronic ulcer of skin of other sites with fat layer exposed: Secondary | ICD-10-CM | POA: Diagnosis not present

## 2021-01-31 DIAGNOSIS — L97422 Non-pressure chronic ulcer of left heel and midfoot with fat layer exposed: Secondary | ICD-10-CM | POA: Diagnosis not present

## 2021-02-08 DIAGNOSIS — T8189XD Other complications of procedures, not elsewhere classified, subsequent encounter: Secondary | ICD-10-CM | POA: Diagnosis not present

## 2021-02-08 DIAGNOSIS — L89013 Pressure ulcer of right elbow, stage 3: Secondary | ICD-10-CM | POA: Diagnosis not present

## 2021-02-08 DIAGNOSIS — R339 Retention of urine, unspecified: Secondary | ICD-10-CM | POA: Diagnosis not present

## 2021-02-08 DIAGNOSIS — L89620 Pressure ulcer of left heel, unstageable: Secondary | ICD-10-CM | POA: Diagnosis not present

## 2021-02-08 DIAGNOSIS — L89153 Pressure ulcer of sacral region, stage 3: Secondary | ICD-10-CM | POA: Diagnosis not present

## 2021-02-13 DIAGNOSIS — I4891 Unspecified atrial fibrillation: Secondary | ICD-10-CM | POA: Diagnosis not present

## 2021-02-13 DIAGNOSIS — Z7982 Long term (current) use of aspirin: Secondary | ICD-10-CM | POA: Diagnosis not present

## 2021-02-13 DIAGNOSIS — Z7901 Long term (current) use of anticoagulants: Secondary | ICD-10-CM | POA: Diagnosis not present

## 2021-02-13 DIAGNOSIS — Z794 Long term (current) use of insulin: Secondary | ICD-10-CM | POA: Diagnosis not present

## 2021-02-13 DIAGNOSIS — R69 Illness, unspecified: Secondary | ICD-10-CM | POA: Diagnosis not present

## 2021-02-13 DIAGNOSIS — N485 Ulcer of penis: Secondary | ICD-10-CM | POA: Diagnosis not present

## 2021-02-13 DIAGNOSIS — N493 Fournier gangrene: Secondary | ICD-10-CM | POA: Diagnosis not present

## 2021-02-13 DIAGNOSIS — L089 Local infection of the skin and subcutaneous tissue, unspecified: Secondary | ICD-10-CM | POA: Diagnosis not present

## 2021-02-13 DIAGNOSIS — Z79899 Other long term (current) drug therapy: Secondary | ICD-10-CM | POA: Diagnosis not present

## 2021-02-13 DIAGNOSIS — I739 Peripheral vascular disease, unspecified: Secondary | ICD-10-CM | POA: Diagnosis not present

## 2021-02-13 DIAGNOSIS — E119 Type 2 diabetes mellitus without complications: Secondary | ICD-10-CM | POA: Diagnosis not present

## 2021-02-14 DIAGNOSIS — N493 Fournier gangrene: Secondary | ICD-10-CM | POA: Diagnosis not present

## 2021-02-22 DIAGNOSIS — Z741 Need for assistance with personal care: Secondary | ICD-10-CM | POA: Diagnosis not present

## 2021-02-22 DIAGNOSIS — R278 Other lack of coordination: Secondary | ICD-10-CM | POA: Diagnosis not present

## 2021-02-22 DIAGNOSIS — E1122 Type 2 diabetes mellitus with diabetic chronic kidney disease: Secondary | ICD-10-CM | POA: Diagnosis not present

## 2021-02-22 DIAGNOSIS — R262 Difficulty in walking, not elsewhere classified: Secondary | ICD-10-CM | POA: Diagnosis not present

## 2021-02-22 DIAGNOSIS — R69 Illness, unspecified: Secondary | ICD-10-CM | POA: Diagnosis not present

## 2021-02-22 DIAGNOSIS — N493 Fournier gangrene: Secondary | ICD-10-CM | POA: Diagnosis not present

## 2021-02-22 DIAGNOSIS — R293 Abnormal posture: Secondary | ICD-10-CM | POA: Diagnosis not present

## 2021-02-22 DIAGNOSIS — M6281 Muscle weakness (generalized): Secondary | ICD-10-CM | POA: Diagnosis not present

## 2021-02-22 DIAGNOSIS — N183 Chronic kidney disease, stage 3 unspecified: Secondary | ICD-10-CM | POA: Diagnosis not present

## 2021-02-25 DIAGNOSIS — N183 Chronic kidney disease, stage 3 unspecified: Secondary | ICD-10-CM | POA: Diagnosis not present

## 2021-02-25 DIAGNOSIS — E1122 Type 2 diabetes mellitus with diabetic chronic kidney disease: Secondary | ICD-10-CM | POA: Diagnosis not present

## 2021-02-25 DIAGNOSIS — R278 Other lack of coordination: Secondary | ICD-10-CM | POA: Diagnosis not present

## 2021-02-25 DIAGNOSIS — Z741 Need for assistance with personal care: Secondary | ICD-10-CM | POA: Diagnosis not present

## 2021-02-25 DIAGNOSIS — N493 Fournier gangrene: Secondary | ICD-10-CM | POA: Diagnosis not present

## 2021-02-25 DIAGNOSIS — R262 Difficulty in walking, not elsewhere classified: Secondary | ICD-10-CM | POA: Diagnosis not present

## 2021-02-25 DIAGNOSIS — R69 Illness, unspecified: Secondary | ICD-10-CM | POA: Diagnosis not present

## 2021-02-25 DIAGNOSIS — M6281 Muscle weakness (generalized): Secondary | ICD-10-CM | POA: Diagnosis not present

## 2021-02-25 DIAGNOSIS — R293 Abnormal posture: Secondary | ICD-10-CM | POA: Diagnosis not present

## 2021-02-26 DIAGNOSIS — R293 Abnormal posture: Secondary | ICD-10-CM | POA: Diagnosis not present

## 2021-02-26 DIAGNOSIS — R262 Difficulty in walking, not elsewhere classified: Secondary | ICD-10-CM | POA: Diagnosis not present

## 2021-02-26 DIAGNOSIS — N493 Fournier gangrene: Secondary | ICD-10-CM | POA: Diagnosis not present

## 2021-02-26 DIAGNOSIS — M6281 Muscle weakness (generalized): Secondary | ICD-10-CM | POA: Diagnosis not present

## 2021-02-26 DIAGNOSIS — E1122 Type 2 diabetes mellitus with diabetic chronic kidney disease: Secondary | ICD-10-CM | POA: Diagnosis not present

## 2021-02-26 DIAGNOSIS — R278 Other lack of coordination: Secondary | ICD-10-CM | POA: Diagnosis not present

## 2021-02-26 DIAGNOSIS — R69 Illness, unspecified: Secondary | ICD-10-CM | POA: Diagnosis not present

## 2021-02-26 DIAGNOSIS — Z741 Need for assistance with personal care: Secondary | ICD-10-CM | POA: Diagnosis not present

## 2021-02-26 DIAGNOSIS — N183 Chronic kidney disease, stage 3 unspecified: Secondary | ICD-10-CM | POA: Diagnosis not present

## 2021-02-27 DIAGNOSIS — Z741 Need for assistance with personal care: Secondary | ICD-10-CM | POA: Diagnosis not present

## 2021-02-27 DIAGNOSIS — R278 Other lack of coordination: Secondary | ICD-10-CM | POA: Diagnosis not present

## 2021-02-27 DIAGNOSIS — M6281 Muscle weakness (generalized): Secondary | ICD-10-CM | POA: Diagnosis not present

## 2021-02-27 DIAGNOSIS — R293 Abnormal posture: Secondary | ICD-10-CM | POA: Diagnosis not present

## 2021-02-27 DIAGNOSIS — N183 Chronic kidney disease, stage 3 unspecified: Secondary | ICD-10-CM | POA: Diagnosis not present

## 2021-02-27 DIAGNOSIS — E1122 Type 2 diabetes mellitus with diabetic chronic kidney disease: Secondary | ICD-10-CM | POA: Diagnosis not present

## 2021-02-27 DIAGNOSIS — N493 Fournier gangrene: Secondary | ICD-10-CM | POA: Diagnosis not present

## 2021-02-27 DIAGNOSIS — R262 Difficulty in walking, not elsewhere classified: Secondary | ICD-10-CM | POA: Diagnosis not present

## 2021-02-27 DIAGNOSIS — R69 Illness, unspecified: Secondary | ICD-10-CM | POA: Diagnosis not present

## 2021-02-28 DIAGNOSIS — R69 Illness, unspecified: Secondary | ICD-10-CM | POA: Diagnosis not present

## 2021-02-28 DIAGNOSIS — N493 Fournier gangrene: Secondary | ICD-10-CM | POA: Diagnosis not present

## 2021-02-28 DIAGNOSIS — M6281 Muscle weakness (generalized): Secondary | ICD-10-CM | POA: Diagnosis not present

## 2021-02-28 DIAGNOSIS — R278 Other lack of coordination: Secondary | ICD-10-CM | POA: Diagnosis not present

## 2021-02-28 DIAGNOSIS — Z741 Need for assistance with personal care: Secondary | ICD-10-CM | POA: Diagnosis not present

## 2021-02-28 DIAGNOSIS — R293 Abnormal posture: Secondary | ICD-10-CM | POA: Diagnosis not present

## 2021-02-28 DIAGNOSIS — E1122 Type 2 diabetes mellitus with diabetic chronic kidney disease: Secondary | ICD-10-CM | POA: Diagnosis not present

## 2021-02-28 DIAGNOSIS — R262 Difficulty in walking, not elsewhere classified: Secondary | ICD-10-CM | POA: Diagnosis not present

## 2021-02-28 DIAGNOSIS — N183 Chronic kidney disease, stage 3 unspecified: Secondary | ICD-10-CM | POA: Diagnosis not present

## 2021-03-01 DIAGNOSIS — M6281 Muscle weakness (generalized): Secondary | ICD-10-CM | POA: Diagnosis not present

## 2021-03-01 DIAGNOSIS — R278 Other lack of coordination: Secondary | ICD-10-CM | POA: Diagnosis not present

## 2021-03-01 DIAGNOSIS — R293 Abnormal posture: Secondary | ICD-10-CM | POA: Diagnosis not present

## 2021-03-01 DIAGNOSIS — Z741 Need for assistance with personal care: Secondary | ICD-10-CM | POA: Diagnosis not present

## 2021-03-01 DIAGNOSIS — R69 Illness, unspecified: Secondary | ICD-10-CM | POA: Diagnosis not present

## 2021-03-01 DIAGNOSIS — N183 Chronic kidney disease, stage 3 unspecified: Secondary | ICD-10-CM | POA: Diagnosis not present

## 2021-03-01 DIAGNOSIS — R262 Difficulty in walking, not elsewhere classified: Secondary | ICD-10-CM | POA: Diagnosis not present

## 2021-03-01 DIAGNOSIS — N493 Fournier gangrene: Secondary | ICD-10-CM | POA: Diagnosis not present

## 2021-03-01 DIAGNOSIS — E1122 Type 2 diabetes mellitus with diabetic chronic kidney disease: Secondary | ICD-10-CM | POA: Diagnosis not present

## 2021-03-04 DIAGNOSIS — R293 Abnormal posture: Secondary | ICD-10-CM | POA: Diagnosis not present

## 2021-03-04 DIAGNOSIS — M6281 Muscle weakness (generalized): Secondary | ICD-10-CM | POA: Diagnosis not present

## 2021-03-04 DIAGNOSIS — E1122 Type 2 diabetes mellitus with diabetic chronic kidney disease: Secondary | ICD-10-CM | POA: Diagnosis not present

## 2021-03-04 DIAGNOSIS — N183 Chronic kidney disease, stage 3 unspecified: Secondary | ICD-10-CM | POA: Diagnosis not present

## 2021-03-04 DIAGNOSIS — Z741 Need for assistance with personal care: Secondary | ICD-10-CM | POA: Diagnosis not present

## 2021-03-04 DIAGNOSIS — N493 Fournier gangrene: Secondary | ICD-10-CM | POA: Diagnosis not present

## 2021-03-04 DIAGNOSIS — R278 Other lack of coordination: Secondary | ICD-10-CM | POA: Diagnosis not present

## 2021-03-04 DIAGNOSIS — R69 Illness, unspecified: Secondary | ICD-10-CM | POA: Diagnosis not present

## 2021-03-04 DIAGNOSIS — R262 Difficulty in walking, not elsewhere classified: Secondary | ICD-10-CM | POA: Diagnosis not present

## 2021-03-05 DIAGNOSIS — O8600 Infection of obstetric surgical wound, unspecified: Secondary | ICD-10-CM | POA: Diagnosis not present

## 2021-03-05 DIAGNOSIS — N183 Chronic kidney disease, stage 3 unspecified: Secondary | ICD-10-CM | POA: Diagnosis not present

## 2021-03-05 DIAGNOSIS — R278 Other lack of coordination: Secondary | ICD-10-CM | POA: Diagnosis not present

## 2021-03-05 DIAGNOSIS — Z741 Need for assistance with personal care: Secondary | ICD-10-CM | POA: Diagnosis not present

## 2021-03-05 DIAGNOSIS — R69 Illness, unspecified: Secondary | ICD-10-CM | POA: Diagnosis not present

## 2021-03-05 DIAGNOSIS — N493 Fournier gangrene: Secondary | ICD-10-CM | POA: Diagnosis not present

## 2021-03-05 DIAGNOSIS — M6281 Muscle weakness (generalized): Secondary | ICD-10-CM | POA: Diagnosis not present

## 2021-03-05 DIAGNOSIS — R262 Difficulty in walking, not elsewhere classified: Secondary | ICD-10-CM | POA: Diagnosis not present

## 2021-03-05 DIAGNOSIS — R293 Abnormal posture: Secondary | ICD-10-CM | POA: Diagnosis not present

## 2021-03-05 DIAGNOSIS — E1122 Type 2 diabetes mellitus with diabetic chronic kidney disease: Secondary | ICD-10-CM | POA: Diagnosis not present

## 2021-03-06 DIAGNOSIS — L732 Hidradenitis suppurativa: Secondary | ICD-10-CM | POA: Diagnosis not present

## 2021-03-06 DIAGNOSIS — N493 Fournier gangrene: Secondary | ICD-10-CM | POA: Diagnosis not present

## 2021-03-06 DIAGNOSIS — M6281 Muscle weakness (generalized): Secondary | ICD-10-CM | POA: Diagnosis not present

## 2021-03-06 DIAGNOSIS — Z741 Need for assistance with personal care: Secondary | ICD-10-CM | POA: Diagnosis not present

## 2021-03-06 DIAGNOSIS — R262 Difficulty in walking, not elsewhere classified: Secondary | ICD-10-CM | POA: Diagnosis not present

## 2021-03-06 DIAGNOSIS — R69 Illness, unspecified: Secondary | ICD-10-CM | POA: Diagnosis not present

## 2021-03-06 DIAGNOSIS — R293 Abnormal posture: Secondary | ICD-10-CM | POA: Diagnosis not present

## 2021-03-06 DIAGNOSIS — L03314 Cellulitis of groin: Secondary | ICD-10-CM | POA: Diagnosis not present

## 2021-03-06 DIAGNOSIS — R278 Other lack of coordination: Secondary | ICD-10-CM | POA: Diagnosis not present

## 2021-03-06 DIAGNOSIS — N183 Chronic kidney disease, stage 3 unspecified: Secondary | ICD-10-CM | POA: Diagnosis not present

## 2021-03-06 DIAGNOSIS — E1122 Type 2 diabetes mellitus with diabetic chronic kidney disease: Secondary | ICD-10-CM | POA: Diagnosis not present

## 2021-03-08 DIAGNOSIS — T8189XD Other complications of procedures, not elsewhere classified, subsequent encounter: Secondary | ICD-10-CM | POA: Diagnosis not present

## 2021-03-08 DIAGNOSIS — L89013 Pressure ulcer of right elbow, stage 3: Secondary | ICD-10-CM | POA: Diagnosis not present

## 2021-03-08 DIAGNOSIS — R339 Retention of urine, unspecified: Secondary | ICD-10-CM | POA: Diagnosis not present

## 2021-03-08 DIAGNOSIS — L89620 Pressure ulcer of left heel, unstageable: Secondary | ICD-10-CM | POA: Diagnosis not present

## 2021-03-08 DIAGNOSIS — L89153 Pressure ulcer of sacral region, stage 3: Secondary | ICD-10-CM | POA: Diagnosis not present

## 2021-04-01 DIAGNOSIS — R69 Illness, unspecified: Secondary | ICD-10-CM | POA: Diagnosis not present

## 2021-04-01 DIAGNOSIS — F411 Generalized anxiety disorder: Secondary | ICD-10-CM | POA: Diagnosis not present

## 2021-04-08 DIAGNOSIS — L89620 Pressure ulcer of left heel, unstageable: Secondary | ICD-10-CM | POA: Diagnosis not present

## 2021-04-08 DIAGNOSIS — R339 Retention of urine, unspecified: Secondary | ICD-10-CM | POA: Diagnosis not present

## 2021-04-08 DIAGNOSIS — L89013 Pressure ulcer of right elbow, stage 3: Secondary | ICD-10-CM | POA: Diagnosis not present

## 2021-04-08 DIAGNOSIS — L89153 Pressure ulcer of sacral region, stage 3: Secondary | ICD-10-CM | POA: Diagnosis not present

## 2021-04-08 DIAGNOSIS — T8189XD Other complications of procedures, not elsewhere classified, subsequent encounter: Secondary | ICD-10-CM | POA: Diagnosis not present

## 2021-04-10 DIAGNOSIS — F411 Generalized anxiety disorder: Secondary | ICD-10-CM | POA: Diagnosis not present

## 2021-04-10 DIAGNOSIS — R69 Illness, unspecified: Secondary | ICD-10-CM | POA: Diagnosis not present

## 2021-04-16 DIAGNOSIS — L89000 Pressure ulcer of unspecified elbow, unstageable: Secondary | ICD-10-CM | POA: Diagnosis not present

## 2021-04-16 DIAGNOSIS — R69 Illness, unspecified: Secondary | ICD-10-CM | POA: Diagnosis not present

## 2021-04-16 DIAGNOSIS — E1165 Type 2 diabetes mellitus with hyperglycemia: Secondary | ICD-10-CM | POA: Diagnosis not present

## 2021-04-16 DIAGNOSIS — I10 Essential (primary) hypertension: Secondary | ICD-10-CM | POA: Diagnosis not present

## 2021-04-21 DIAGNOSIS — I469 Cardiac arrest, cause unspecified: Secondary | ICD-10-CM | POA: Diagnosis not present

## 2021-04-21 DIAGNOSIS — I447 Left bundle-branch block, unspecified: Secondary | ICD-10-CM | POA: Diagnosis not present

## 2021-04-21 DIAGNOSIS — I499 Cardiac arrhythmia, unspecified: Secondary | ICD-10-CM | POA: Diagnosis not present

## 2021-04-21 DIAGNOSIS — R4020 Unspecified coma: Secondary | ICD-10-CM | POA: Diagnosis not present

## 2021-04-21 DIAGNOSIS — R404 Transient alteration of awareness: Secondary | ICD-10-CM | POA: Diagnosis not present

## 2021-05-11 DEATH — deceased

## 2022-03-08 IMAGING — DX DG CHEST 1V PORT
1 series · 1 of 1 positions shown · non-contrast
Comparison: 04/24/2020

CLINICAL DATA: Sepsis.

EXAM:
PORTABLE CHEST 1 VIEW

[chest ap]
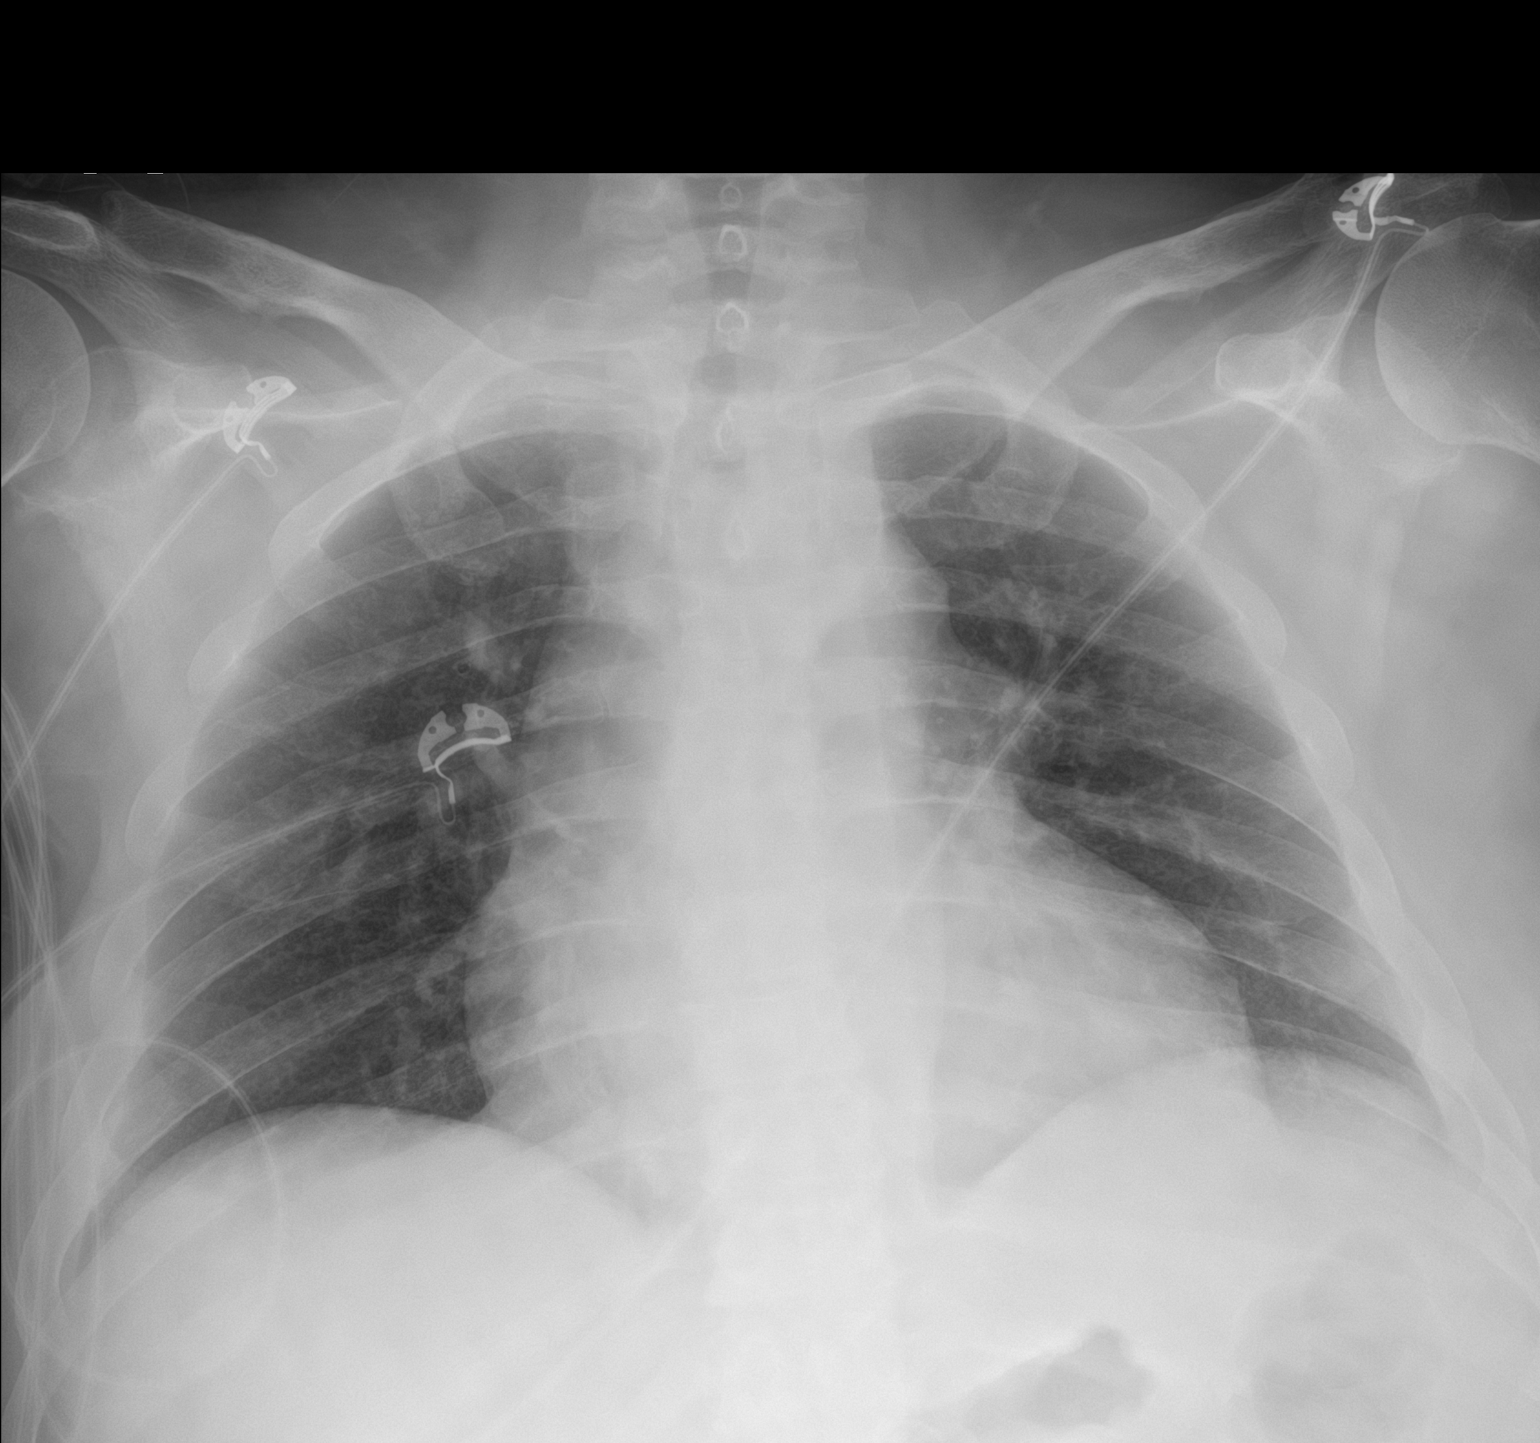

[1 of 1 positions shown; findings below may reference images not displayed]

FINDINGS: The heart size remains enlarged. There is no focal infiltrate. No
large pleural effusion. No pneumothorax. There is no acute osseous
abnormality.
IMPRESSION: No active disease.

## 2022-03-08 IMAGING — CT CT ABD-PELV W/ CM
2 of 5 series · 16 of 46 positions shown, 18 images · IV contrast (APPLIED)
Comparison: April 23, 2020

CLINICAL DATA: Groin wound infection.

EXAM:
CT ABDOMEN AND PELVIS WITH CONTRAST
TECHNIQUE: Multidetector CT imaging of the abdomen and pelvis was performed
using the standard protocol following bolus administration of
intravenous contrast.
CONTRAST:  100mL OMNIPAQUE IOHEXOL 300 MG/ML  SOLN

[Series 3: abd/ pelvis 5.0 i30f 2 · axial · 0.85mm/px · z∈[+770,+1205]mm · 13 of 97 slices shown, 15 images]
[im 5/97  soft-tissue]
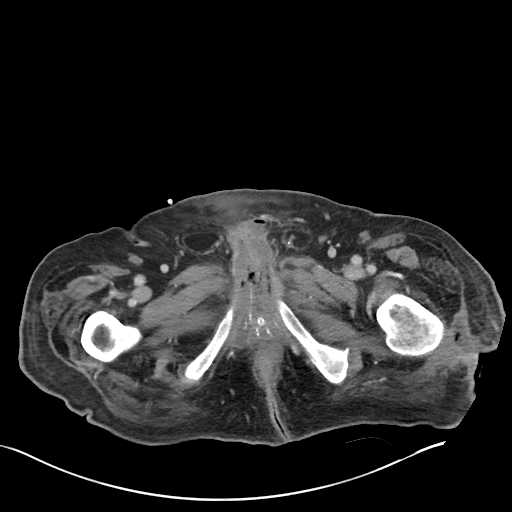
[im 5/97  bone]
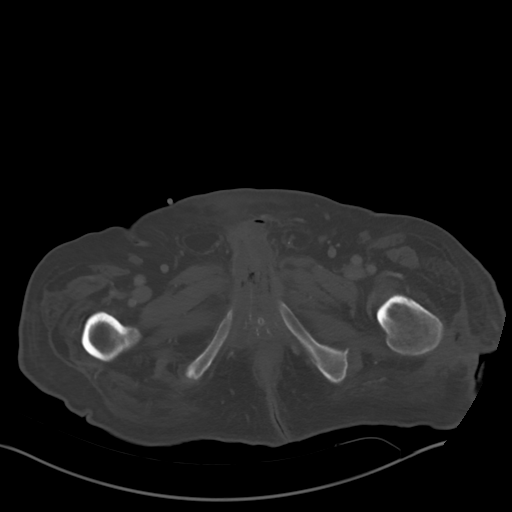
[im 14/97  soft-tissue]
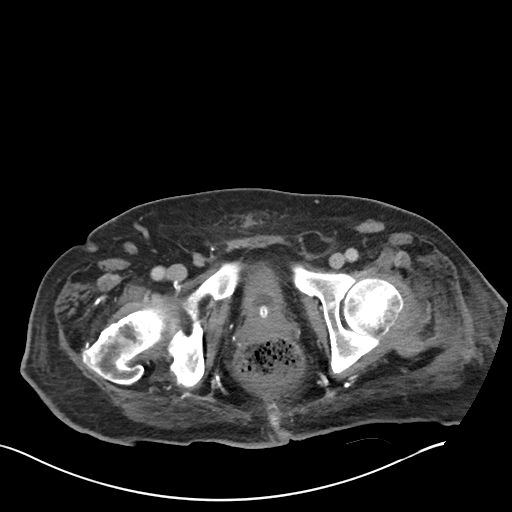
[im 19/97  soft-tissue]
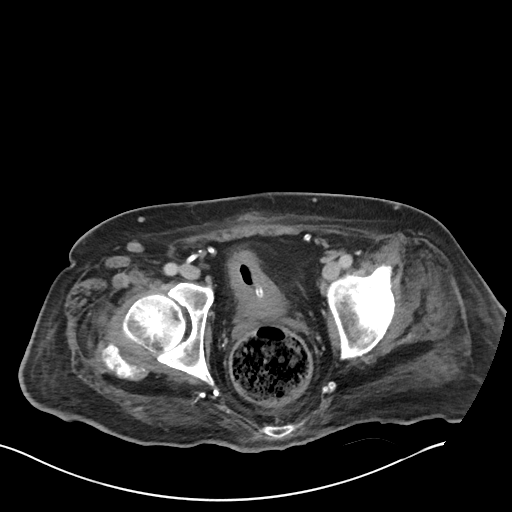
[im 28/97  soft-tissue]
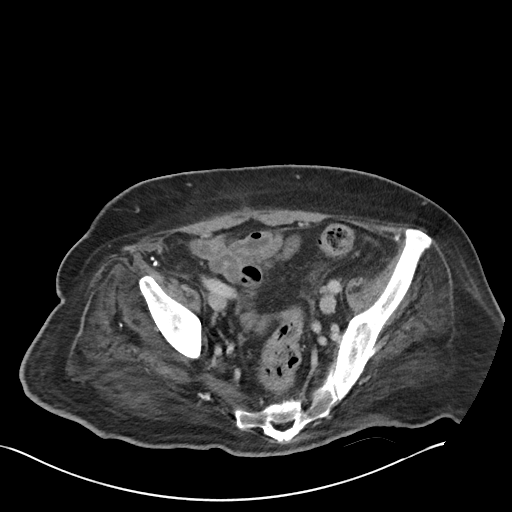
[im 33/97  soft-tissue]
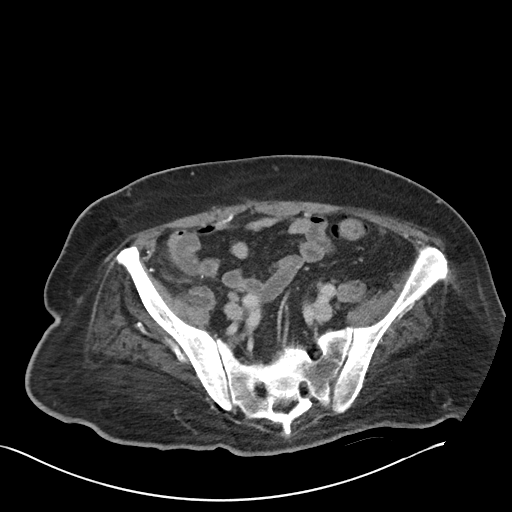
[im 42/97  soft-tissue]
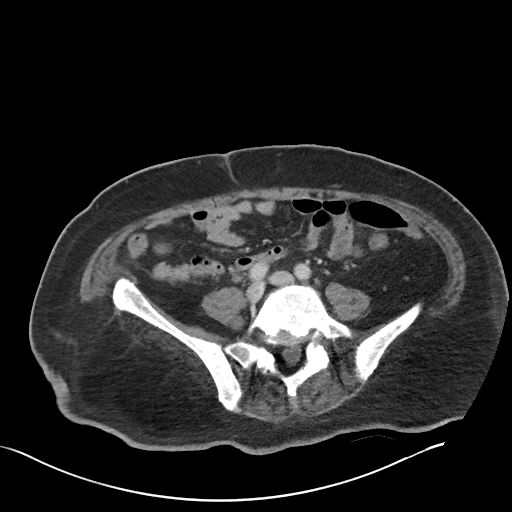
[im 51/97  soft-tissue]
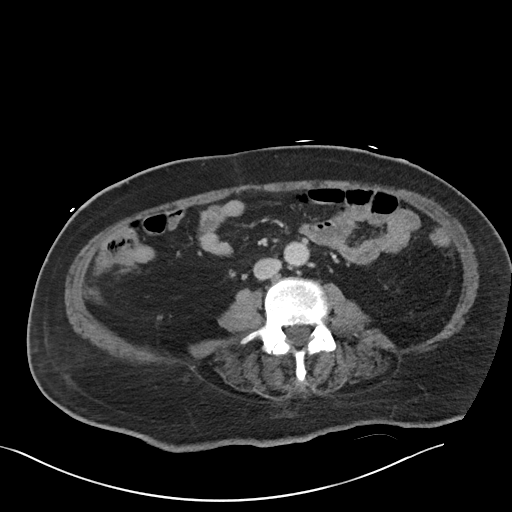
[im 55/97  soft-tissue]
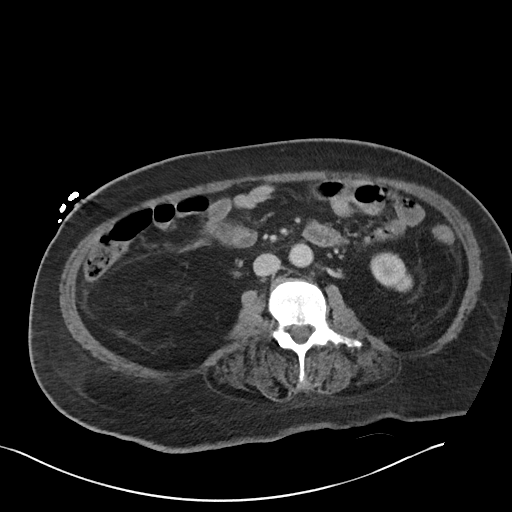
[im 65/97  soft-tissue]
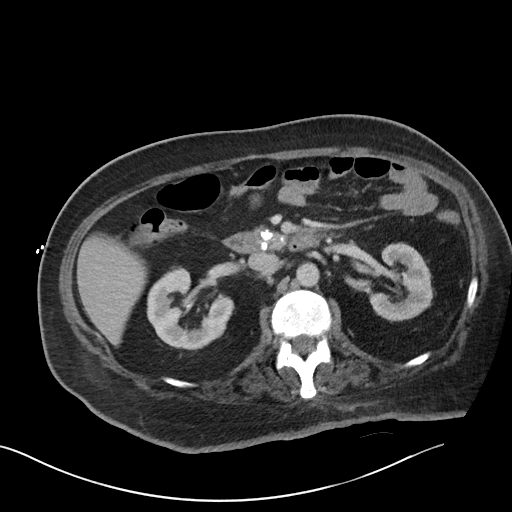
[im 65/97  bone]
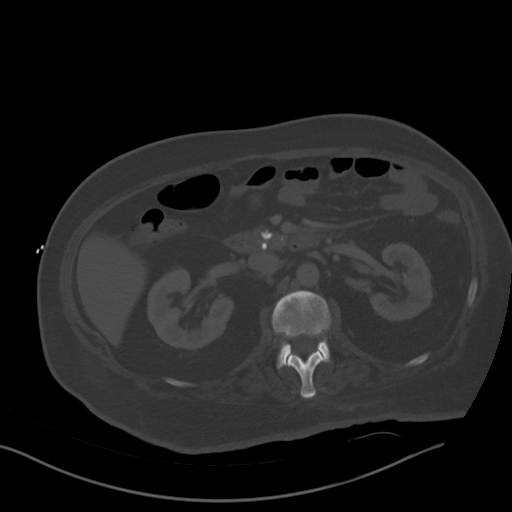
[im 69/97  soft-tissue]
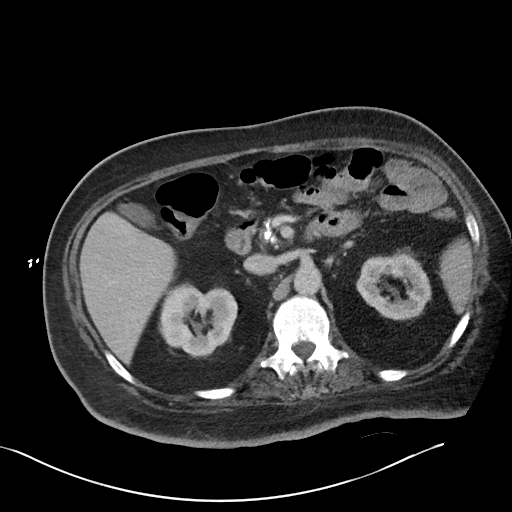
[im 78/97  soft-tissue]
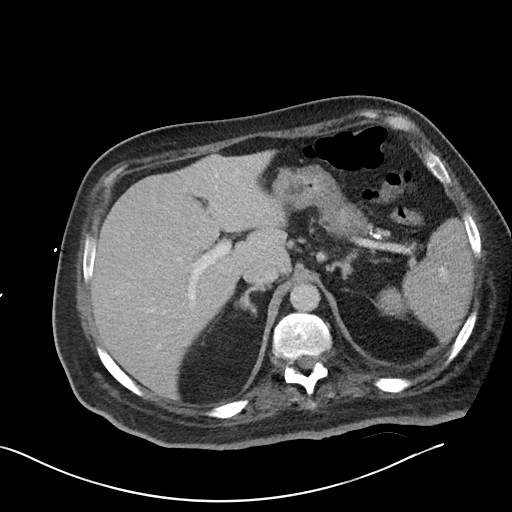
[im 83/97  soft-tissue]
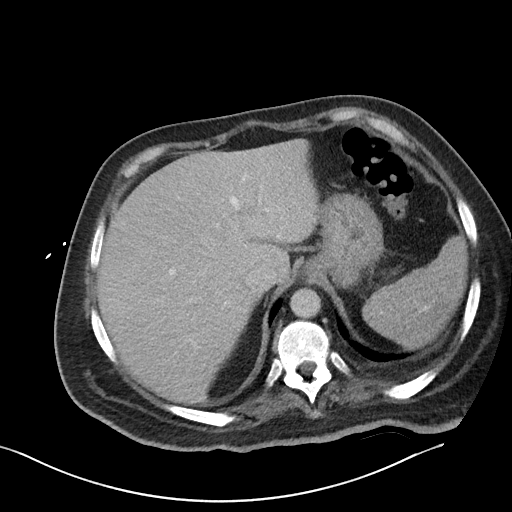
[im 92/97  soft-tissue]
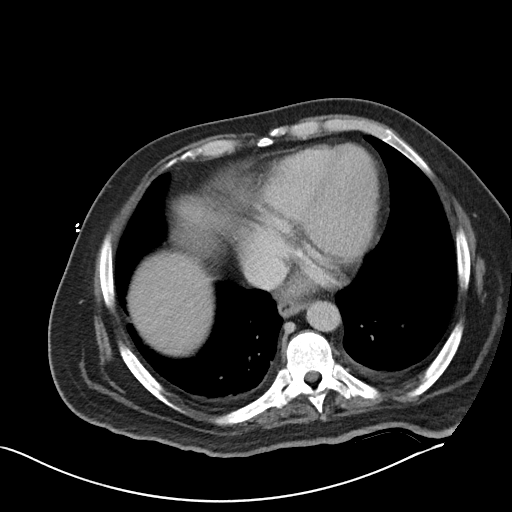

[Series 6: coronal soft tissue · coronal · 0.95mm/px · 3 of 101 slices shown]
[im 34/101  soft-tissue]
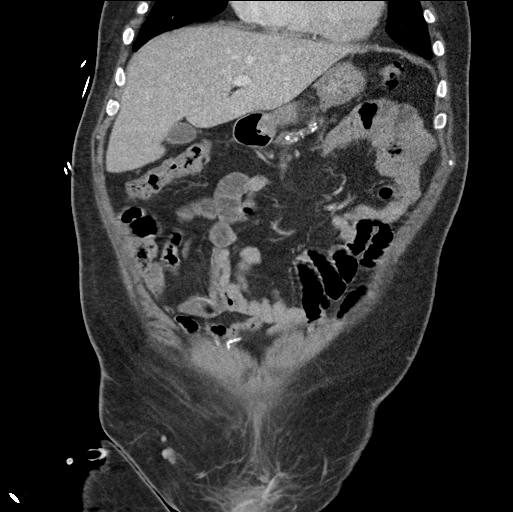
[im 45/101  soft-tissue]
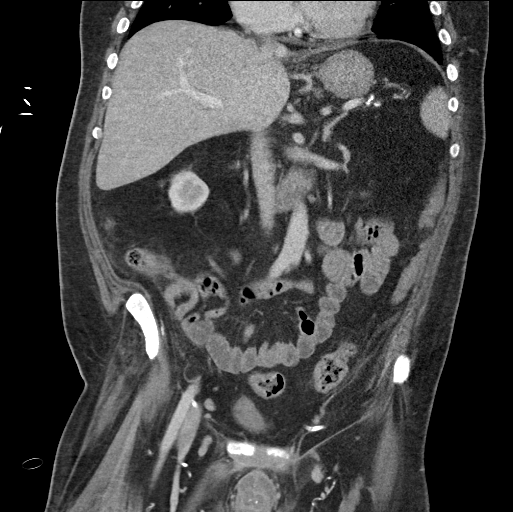
[im 56/101  soft-tissue]
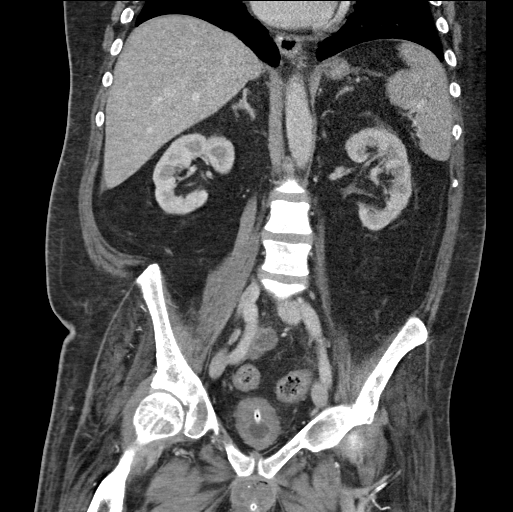

[16 of 46 positions shown; findings below may reference images not displayed]

FINDINGS: Lower chest: No acute abnormality.

Hepatobiliary: No focal liver abnormality is seen. No gallstones,
gallbladder wall thickening, or biliary dilatation.

Pancreas: Numerous subcentimeter parenchymal calcifications are seen
scattered throughout the pancreas.

Spleen: Normal in size without focal abnormality.

Adrenals/Urinary Tract: Adrenal glands are unremarkable. Kidneys are
normal, without renal calculi, focal lesion, or hydronephrosis. A
Foley catheter is seen within a nearly empty urinary bladder. A mild
amount of air is seen within the bladder lumen with moderate
severity diffuse bladder wall thickening.

Stomach/Bowel: Stomach is within normal limits. Appendix appears
normal. No evidence of bowel wall thickening, distention, or
inflammatory changes.

Vascular/Lymphatic: No significant vascular findings are present. No
enlarged abdominal or pelvic lymph nodes.

Reproductive: Prostate is unremarkable.

Other: There is a 3.5 cm x 2.2 cm fat containing right inguinal
hernia. A 3.1 cm x 1.7 cm fat containing left inguinal hernia is
also noted.

A 1.9 cm thick, the elongated (approximately 8.0 cm) complex
collection of fluid and air is seen adjacent to the dorsal aspect of
the corpora cavernosa of the base of the penis. A thin, surrounding
hyperdense rim is present with associated moderate severity adjacent
inflammatory fat stranding.

Musculoskeletal: A large soft tissue defect is seen along the
lateral aspect of the left hip, with findings suggestive of prior
irrigation and debridement since the prior study.

Predominantly stable heterogeneous sclerotic areas are seen
involving predominantly the L1, L2, L3 and L4 vertebral bodies.
IMPRESSION: 1. Findings consistent with an abscess adjacent to the dorsal aspect
of the corpora cavernosa of the base of the penis, as described
above.
2. Bilateral fat containing inguinal hernias.
3. Findings consistent with chronic pancreatitis.
4. Suspected interval wound irrigation and debridement along the
lateral aspect of the left hip since the prior study.
